# Patient Record
Sex: Male | Born: 1965 | Race: White | Hispanic: No | Marital: Married | Smoking: Never smoker
Health system: Southern US, Community
[De-identification: ages and names within clinical notes are randomized; demographics above are authoritative.]

## PROBLEM LIST (undated history)

## (undated) DIAGNOSIS — R05 Cough: Secondary | ICD-10-CM

## (undated) DIAGNOSIS — F419 Anxiety disorder, unspecified: Secondary | ICD-10-CM

## (undated) DIAGNOSIS — K7581 Nonalcoholic steatohepatitis (NASH): Secondary | ICD-10-CM

## (undated) DIAGNOSIS — E039 Hypothyroidism, unspecified: Secondary | ICD-10-CM

## (undated) DIAGNOSIS — K219 Gastro-esophageal reflux disease without esophagitis: Secondary | ICD-10-CM

## (undated) DIAGNOSIS — R200 Anesthesia of skin: Secondary | ICD-10-CM

## (undated) DIAGNOSIS — G4733 Obstructive sleep apnea (adult) (pediatric): Secondary | ICD-10-CM

## (undated) DIAGNOSIS — E785 Hyperlipidemia, unspecified: Secondary | ICD-10-CM

## (undated) DIAGNOSIS — E119 Type 2 diabetes mellitus without complications: Secondary | ICD-10-CM

## (undated) DIAGNOSIS — I2510 Atherosclerotic heart disease of native coronary artery without angina pectoris: Secondary | ICD-10-CM

## (undated) DIAGNOSIS — M545 Low back pain, unspecified: Secondary | ICD-10-CM

## (undated) DIAGNOSIS — I10 Essential (primary) hypertension: Secondary | ICD-10-CM

## (undated) DIAGNOSIS — G4730 Sleep apnea, unspecified: Secondary | ICD-10-CM

## (undated) DIAGNOSIS — J45909 Unspecified asthma, uncomplicated: Secondary | ICD-10-CM

## (undated) DIAGNOSIS — R059 Cough, unspecified: Secondary | ICD-10-CM

## (undated) HISTORY — DX: Essential (primary) hypertension: I10

## (undated) HISTORY — DX: Low back pain, unspecified: M54.50

## (undated) HISTORY — DX: Cough: R05

## (undated) HISTORY — DX: Sleep apnea, unspecified: G47.30

## (undated) HISTORY — DX: Hypothyroidism, unspecified: E03.9

## (undated) HISTORY — PX: CARDIAC CATHETERIZATION: SHX172

## (undated) HISTORY — DX: Obstructive sleep apnea (adult) (pediatric): G47.33

## (undated) HISTORY — PX: OTHER SURGICAL HISTORY: SHX169

## (undated) HISTORY — DX: Low back pain: M54.5

## (undated) HISTORY — DX: Hyperlipidemia, unspecified: E78.5

## (undated) HISTORY — DX: Anesthesia of skin: R20.0

## (undated) HISTORY — DX: Gastro-esophageal reflux disease without esophagitis: K21.9

## (undated) HISTORY — DX: Cough, unspecified: R05.9

## (undated) HISTORY — DX: Unspecified asthma, uncomplicated: J45.909

## (undated) HISTORY — DX: Nonalcoholic steatohepatitis (NASH): K75.81

## (undated) HISTORY — PX: THYROIDECTOMY: SHX17

## (undated) HISTORY — PX: TRIGGER FINGER RELEASE: SHX641

## (undated) HISTORY — DX: Type 2 diabetes mellitus without complications: E11.9

---

## 1993-04-04 DIAGNOSIS — I10 Essential (primary) hypertension: Secondary | ICD-10-CM

## 1993-04-04 HISTORY — DX: Essential (primary) hypertension: I10

## 1995-04-05 HISTORY — PX: OTHER SURGICAL HISTORY: SHX169

## 1998-04-04 DIAGNOSIS — E119 Type 2 diabetes mellitus without complications: Secondary | ICD-10-CM

## 1998-04-04 HISTORY — DX: Type 2 diabetes mellitus without complications: E11.9

## 1999-06-07 ENCOUNTER — Encounter: Admission: RE | Admit: 1999-06-07 | Discharge: 1999-09-05 | Payer: Self-pay | Admitting: *Deleted

## 1999-09-21 ENCOUNTER — Emergency Department (HOSPITAL_COMMUNITY): Admission: EM | Admit: 1999-09-21 | Discharge: 1999-09-21 | Payer: Self-pay | Admitting: Emergency Medicine

## 1999-09-21 ENCOUNTER — Encounter: Payer: Self-pay | Admitting: Emergency Medicine

## 2000-04-04 DIAGNOSIS — E039 Hypothyroidism, unspecified: Secondary | ICD-10-CM

## 2000-04-04 HISTORY — DX: Hypothyroidism, unspecified: E03.9

## 2001-03-21 ENCOUNTER — Emergency Department (HOSPITAL_COMMUNITY): Admission: EM | Admit: 2001-03-21 | Discharge: 2001-03-21 | Payer: Self-pay

## 2003-04-28 ENCOUNTER — Ambulatory Visit (HOSPITAL_BASED_OUTPATIENT_CLINIC_OR_DEPARTMENT_OTHER): Admission: RE | Admit: 2003-04-28 | Discharge: 2003-04-28 | Payer: Self-pay | Admitting: Orthopedic Surgery

## 2003-04-28 ENCOUNTER — Ambulatory Visit (HOSPITAL_COMMUNITY): Admission: RE | Admit: 2003-04-28 | Discharge: 2003-04-28 | Payer: Self-pay | Admitting: Orthopedic Surgery

## 2003-08-11 ENCOUNTER — Ambulatory Visit (HOSPITAL_COMMUNITY): Admission: RE | Admit: 2003-08-11 | Discharge: 2003-08-11 | Payer: Self-pay | Admitting: Orthopedic Surgery

## 2003-08-11 ENCOUNTER — Ambulatory Visit (HOSPITAL_BASED_OUTPATIENT_CLINIC_OR_DEPARTMENT_OTHER): Admission: RE | Admit: 2003-08-11 | Discharge: 2003-08-11 | Payer: Self-pay | Admitting: Orthopedic Surgery

## 2004-02-13 ENCOUNTER — Encounter: Admission: RE | Admit: 2004-02-13 | Discharge: 2004-03-10 | Payer: Self-pay | Admitting: Family Medicine

## 2004-02-23 ENCOUNTER — Observation Stay (HOSPITAL_COMMUNITY): Admission: RE | Admit: 2004-02-23 | Discharge: 2004-02-24 | Payer: Self-pay | Admitting: Urology

## 2004-06-24 ENCOUNTER — Ambulatory Visit: Payer: Self-pay | Admitting: Family Medicine

## 2004-08-16 ENCOUNTER — Emergency Department (HOSPITAL_COMMUNITY): Admission: EM | Admit: 2004-08-16 | Discharge: 2004-08-16 | Payer: Self-pay | Admitting: Emergency Medicine

## 2005-01-25 ENCOUNTER — Ambulatory Visit: Payer: Self-pay | Admitting: Family Medicine

## 2005-02-03 ENCOUNTER — Ambulatory Visit: Payer: Self-pay | Admitting: Family Medicine

## 2005-02-10 ENCOUNTER — Ambulatory Visit: Payer: Self-pay | Admitting: Family Medicine

## 2005-07-07 ENCOUNTER — Ambulatory Visit: Payer: Self-pay | Admitting: Family Medicine

## 2005-07-12 ENCOUNTER — Ambulatory Visit: Payer: Self-pay | Admitting: Family Medicine

## 2005-07-12 ENCOUNTER — Ambulatory Visit: Payer: Self-pay | Admitting: Endocrinology

## 2005-08-10 ENCOUNTER — Ambulatory Visit: Payer: Self-pay | Admitting: Endocrinology

## 2005-08-31 ENCOUNTER — Ambulatory Visit: Payer: Self-pay | Admitting: Endocrinology

## 2006-01-24 ENCOUNTER — Ambulatory Visit: Payer: Self-pay | Admitting: Endocrinology

## 2006-01-30 ENCOUNTER — Encounter: Admission: RE | Admit: 2006-01-30 | Discharge: 2006-01-30 | Payer: Self-pay | Admitting: Endocrinology

## 2006-02-21 ENCOUNTER — Ambulatory Visit: Payer: Self-pay | Admitting: Endocrinology

## 2006-03-06 ENCOUNTER — Other Ambulatory Visit: Admission: RE | Admit: 2006-03-06 | Discharge: 2006-03-06 | Payer: Self-pay | Admitting: Interventional Radiology

## 2006-03-06 ENCOUNTER — Encounter (INDEPENDENT_AMBULATORY_CARE_PROVIDER_SITE_OTHER): Payer: Self-pay | Admitting: Specialist

## 2006-03-06 ENCOUNTER — Encounter: Admission: RE | Admit: 2006-03-06 | Discharge: 2006-03-06 | Payer: Self-pay | Admitting: Endocrinology

## 2006-03-07 ENCOUNTER — Ambulatory Visit: Payer: Self-pay | Admitting: Endocrinology

## 2006-04-24 ENCOUNTER — Ambulatory Visit: Admission: RE | Admit: 2006-04-24 | Discharge: 2006-04-24 | Payer: Self-pay | Admitting: *Deleted

## 2006-04-24 ENCOUNTER — Ambulatory Visit: Payer: Self-pay | Admitting: Endocrinology

## 2006-04-24 LAB — CONVERTED CEMR LAB: Hepatitis B Surface Ag: NEGATIVE

## 2006-04-26 ENCOUNTER — Ambulatory Visit: Payer: Self-pay | Admitting: Endocrinology

## 2006-05-08 ENCOUNTER — Ambulatory Visit: Payer: Self-pay | Admitting: Endocrinology

## 2006-05-29 ENCOUNTER — Ambulatory Visit: Payer: Self-pay | Admitting: Endocrinology

## 2006-05-31 ENCOUNTER — Encounter (INDEPENDENT_AMBULATORY_CARE_PROVIDER_SITE_OTHER): Payer: Self-pay | Admitting: Specialist

## 2006-05-31 ENCOUNTER — Inpatient Hospital Stay (HOSPITAL_COMMUNITY): Admission: RE | Admit: 2006-05-31 | Discharge: 2006-06-01 | Payer: Self-pay | Admitting: *Deleted

## 2006-06-19 ENCOUNTER — Ambulatory Visit: Payer: Self-pay | Admitting: Endocrinology

## 2006-10-07 ENCOUNTER — Emergency Department (HOSPITAL_COMMUNITY): Admission: EM | Admit: 2006-10-07 | Discharge: 2006-10-07 | Payer: Self-pay | Admitting: Family Medicine

## 2006-10-28 ENCOUNTER — Encounter: Payer: Self-pay | Admitting: Endocrinology

## 2006-10-28 DIAGNOSIS — J45909 Unspecified asthma, uncomplicated: Secondary | ICD-10-CM | POA: Insufficient documentation

## 2006-10-28 HISTORY — DX: Unspecified asthma, uncomplicated: J45.909

## 2006-11-07 ENCOUNTER — Ambulatory Visit: Payer: Self-pay | Admitting: Endocrinology

## 2006-11-07 LAB — CONVERTED CEMR LAB
Albumin: 4.2 g/dL (ref 3.5–5.2)
Alkaline Phosphatase: 75 units/L (ref 39–117)
BUN: 14 mg/dL (ref 6–23)
Bilirubin, Direct: 0.1 mg/dL (ref 0.0–0.3)
Cholesterol: 176 mg/dL (ref 0–200)
Creatinine,U: 157.9 mg/dL
GFR calc Af Amer: 105 mL/min
HDL: 31.1 mg/dL — ABNORMAL LOW (ref >39.0)
Microalb, Ur: 2.1 mg/dL — ABNORMAL HIGH (ref 0.0–1.9)
TSH: 6.75 microintl units/mL — ABNORMAL HIGH (ref 0.35–5.50)
Total Bilirubin: 0.9 mg/dL (ref 0.3–1.2)
Total Protein: 7.3 g/dL (ref 6.0–8.3)
Triglycerides: 228 mg/dL (ref 0–149)
VLDL: 46 mg/dL — ABNORMAL HIGH (ref 0–40)

## 2007-01-19 ENCOUNTER — Encounter: Payer: Self-pay | Admitting: Endocrinology

## 2007-01-19 ENCOUNTER — Ambulatory Visit: Payer: Self-pay | Admitting: Endocrinology

## 2007-03-06 ENCOUNTER — Ambulatory Visit: Payer: Self-pay | Admitting: Endocrinology

## 2007-03-06 DIAGNOSIS — R05 Cough: Secondary | ICD-10-CM

## 2007-03-07 LAB — CONVERTED CEMR LAB
Leukocytes, UA: NEGATIVE
Mucus, UA: NEGATIVE
Specific Gravity, Urine: >=1.03 (ref 1.000–1.03)
Urine Glucose: >=1000 mg/dL — CR
pH: 5 (ref 5.0–8.0)

## 2007-04-16 ENCOUNTER — Telehealth (INDEPENDENT_AMBULATORY_CARE_PROVIDER_SITE_OTHER): Payer: Self-pay | Admitting: *Deleted

## 2007-04-18 ENCOUNTER — Ambulatory Visit: Payer: Self-pay | Admitting: Endocrinology

## 2007-04-18 LAB — CONVERTED CEMR LAB
Folate: 12.1 ng/mL
Hgb A1c MFr Bld: 10.4 % — ABNORMAL HIGH (ref 4.6–6.0)
Vitamin B-12: 482 pg/mL (ref 211–911)

## 2007-05-22 ENCOUNTER — Ambulatory Visit: Payer: Self-pay | Admitting: Endocrinology

## 2007-06-26 ENCOUNTER — Ambulatory Visit: Payer: Self-pay | Admitting: Endocrinology

## 2007-10-25 ENCOUNTER — Ambulatory Visit: Payer: Self-pay | Admitting: Endocrinology

## 2007-11-01 ENCOUNTER — Telehealth (INDEPENDENT_AMBULATORY_CARE_PROVIDER_SITE_OTHER): Payer: Self-pay | Admitting: *Deleted

## 2007-12-17 ENCOUNTER — Telehealth: Payer: Self-pay | Admitting: Family Medicine

## 2008-01-10 ENCOUNTER — Ambulatory Visit: Payer: Self-pay | Admitting: Family Medicine

## 2008-01-10 LAB — CONVERTED CEMR LAB
ALT: 92 units/L — ABNORMAL HIGH (ref 0–53)
AST: 67 units/L — ABNORMAL HIGH (ref 0–37)
Albumin: 4.5 g/dL (ref 3.5–5.2)
Alkaline Phosphatase: 68 units/L (ref 39–117)
Basophils Relative: 0.8 % (ref 0.0–3.0)
Bilirubin, Direct: 0.1 mg/dL (ref 0.0–0.3)
Blood in Urine, dipstick: NEGATIVE
Calcium: 9.3 mg/dL (ref 8.4–10.5)
Chloride: 101 meq/L (ref 96–112)
GFR calc Af Amer: 104 mL/min
GFR calc non Af Amer: 86 mL/min
Glucose, Bld: 189 mg/dL — ABNORMAL HIGH (ref 70–99)
HCT: 45.7 % (ref 39.0–52.0)
Lymphocytes Relative: 25 % (ref 12.0–46.0)
Monocytes Absolute: 0.7 10*3/uL (ref 0.1–1.0)
Monocytes Relative: 7.9 % (ref 3.0–12.0)
Neutro Abs: 6.1 10*3/uL (ref 1.4–7.7)
Neutrophils Relative %: 64.8 % (ref 43.0–77.0)
Platelets: 241 10*3/uL (ref 150–400)
Specific Gravity, Urine: >=1.03
Total Bilirubin: 0.9 mg/dL (ref 0.3–1.2)
Total Protein: 8.2 g/dL (ref 6.0–8.3)
WBC Urine, dipstick: NEGATIVE

## 2008-01-17 ENCOUNTER — Ambulatory Visit: Payer: Self-pay | Admitting: Family Medicine

## 2008-01-29 ENCOUNTER — Telehealth: Payer: Self-pay | Admitting: Endocrinology

## 2008-02-11 ENCOUNTER — Telehealth: Payer: Self-pay | Admitting: Endocrinology

## 2008-02-19 ENCOUNTER — Telehealth (INDEPENDENT_AMBULATORY_CARE_PROVIDER_SITE_OTHER): Payer: Self-pay | Admitting: *Deleted

## 2008-02-21 ENCOUNTER — Telehealth (INDEPENDENT_AMBULATORY_CARE_PROVIDER_SITE_OTHER): Payer: Self-pay | Admitting: *Deleted

## 2008-03-03 ENCOUNTER — Telehealth: Payer: Self-pay | Admitting: Endocrinology

## 2008-03-04 ENCOUNTER — Telehealth: Payer: Self-pay | Admitting: *Deleted

## 2008-03-11 ENCOUNTER — Telehealth: Payer: Self-pay | Admitting: Family Medicine

## 2008-03-12 ENCOUNTER — Telehealth (INDEPENDENT_AMBULATORY_CARE_PROVIDER_SITE_OTHER): Payer: Self-pay | Admitting: *Deleted

## 2008-04-21 ENCOUNTER — Encounter
Admission: RE | Admit: 2008-04-21 | Discharge: 2008-07-20 | Payer: Self-pay | Admitting: Physical Medicine & Rehabilitation

## 2008-04-22 ENCOUNTER — Ambulatory Visit: Payer: Self-pay | Admitting: Physical Medicine & Rehabilitation

## 2008-04-28 ENCOUNTER — Encounter
Admission: RE | Admit: 2008-04-28 | Discharge: 2008-05-26 | Payer: Self-pay | Admitting: Physical Medicine & Rehabilitation

## 2008-06-04 ENCOUNTER — Ambulatory Visit: Payer: Self-pay | Admitting: Physical Medicine & Rehabilitation

## 2008-08-25 ENCOUNTER — Encounter
Admission: RE | Admit: 2008-08-25 | Discharge: 2008-08-27 | Payer: Self-pay | Admitting: Physical Medicine & Rehabilitation

## 2008-08-27 ENCOUNTER — Ambulatory Visit: Payer: Self-pay | Admitting: Physical Medicine & Rehabilitation

## 2009-04-01 ENCOUNTER — Emergency Department (HOSPITAL_BASED_OUTPATIENT_CLINIC_OR_DEPARTMENT_OTHER): Admission: EM | Admit: 2009-04-01 | Discharge: 2009-04-01 | Payer: Self-pay | Admitting: Emergency Medicine

## 2009-04-07 ENCOUNTER — Telehealth: Payer: Self-pay | Admitting: Family Medicine

## 2009-04-07 ENCOUNTER — Ambulatory Visit: Payer: Self-pay | Admitting: Family Medicine

## 2009-04-20 ENCOUNTER — Ambulatory Visit: Payer: Self-pay | Admitting: Family Medicine

## 2009-04-30 ENCOUNTER — Telehealth: Payer: Self-pay | Admitting: Family Medicine

## 2009-05-12 ENCOUNTER — Ambulatory Visit: Payer: Self-pay | Admitting: Family Medicine

## 2009-05-26 ENCOUNTER — Ambulatory Visit: Payer: Self-pay | Admitting: Family Medicine

## 2009-08-19 ENCOUNTER — Telehealth: Payer: Self-pay | Admitting: Family Medicine

## 2009-12-21 ENCOUNTER — Telehealth: Payer: Self-pay | Admitting: Family Medicine

## 2010-04-25 ENCOUNTER — Encounter: Payer: Self-pay | Admitting: *Deleted

## 2010-05-02 LAB — CONVERTED CEMR LAB
ALT: 92 units/L — ABNORMAL HIGH (ref 0–53)
Alkaline Phosphatase: 78 units/L (ref 39–117)
Basophils Absolute: 0.1 10*3/uL (ref 0.0–0.1)
CO2: 29 meq/L (ref 19–32)
Chloride: 104 meq/L (ref 96–112)
Eosinophils Absolute: 0.3 10*3/uL (ref 0.0–0.7)
Eosinophils Relative: 3.2 % (ref 0.0–5.0)
Glucose, Bld: 110 mg/dL — ABNORMAL HIGH (ref 70–99)
HCT: 50.8 % (ref 39.0–52.0)
Hemoglobin: 16.8 g/dL (ref 13.0–17.0)
Hgb A1c MFr Bld: 9.5 % — ABNORMAL HIGH (ref 4.6–6.5)
Microalb Creat Ratio: 43 mg/g — ABNORMAL HIGH (ref 0.0–30.0)
Microalb, Ur: 15.7 mg/dL — ABNORMAL HIGH (ref 0.0–1.9)
Neutrophils Relative %: 60 % (ref 43.0–77.0)
Platelets: 252 10*3/uL (ref 150.0–400.0)
RBC: 5.41 M/uL (ref 4.22–5.81)
RDW: 12.5 % (ref 11.5–14.6)
TSH: 1.42 microintl units/mL (ref 0.35–5.50)
Total Bilirubin: 0.7 mg/dL (ref 0.3–1.2)
WBC: 8.7 10*3/uL (ref 4.5–10.5)

## 2010-05-06 NOTE — Progress Notes (Signed)
Summary: refill  Phone Note Refill Request Message from:  Fax from Pharmacy on Aug 19, 2009 10:50 AM  Refills Requested: Medication #1:  CELEBREX 200 MG CAPS take one tab once daily Initial call taken by: Kern Reap CMA Duncan Dull),  Aug 19, 2009 10:50 AM    Prescriptions: CELEBREX 200 MG CAPS (CELECOXIB) take one tab once daily  #100 x 3   Entered by:   Kern Reap CMA (AAMA)   Authorized by:   Roderick Pee MD   Signed by:   Kern Reap CMA (AAMA) on 08/19/2009   Method used:   Electronically to        [facility]* (retail)       [address]       [place], [place]  [id number]       Ph: [phone]       Fax: [phone]   RxID:   [id number]

## 2010-05-06 NOTE — Progress Notes (Signed)
Summary: [facility] called req script for Physical Therapy  Phone Note From Other Clinic Call back at [phone] Main #    Caller: [facility]  Summary of Call: [facility] called req a script for physical therapy. Please include DX.  Fax # [phone] Initial call taken by: Lucy Antigua,  April 07, 2009 2:32 PM  Follow-up for Phone Call        [place]   ????????? Follow-up by: Roderick Pee MD,  April 07, 2009 5:33 PM  Additional Follow-up for Phone Call Additional follow up Details #1::        referral sent Additional Follow-up by: Kern Reap CMA Duncan Dull),  April 09, 2009 3:13 PM

## 2010-05-06 NOTE — Progress Notes (Signed)
Summary: Pt req xray be sent over to [facility]  Phone Note Call from Patient Call back at [phone] [facility] cell   Caller: spouse-Beth Summary of Call: Pt is req that his lumbar xray be faxed over to Dr. Atilano Median  [facility].  fax# [phone] Initial call taken by: Lucy Antigua,  April 30, 2009 12:49 PM  Follow-up for Phone Call        e faxed Follow-up by: Kern Reap CMA Duncan Dull),  May 01, 2009 4:37 PM

## 2010-05-06 NOTE — Assessment & Plan Note (Signed)
Summary: fup//ccm   Vital Signs:  Patient profile:   45 year old male Weight:      266 pounds Temp:     98.7 degrees F oral BP sitting:   122 / 90  (left arm) Cuff size:   regular  Vitals Entered By: Kern Reap CMA Duncan Dull) (April 20, 2009 10:28 AM)  Reason for Visit follow up office visit   Primary Care Provider:  Kaelah Hayashi   History of Present Illness: [name] [name] is a 45 year old, married male, nonsmoker, who comes in today for follow-up of low back pain.  We saw him on January the fourth with severe right lumbar back pain.  At that time.  His pain was an 8 on a scale of one to 10.  Neurologic exam was negative.  He was given a short course of prednisone, which he  completed, and Flexeril, and Percocet for severe pain.  The Flexeril.  He taking 3 times a day.  The Percocet.  He taking one at bedtime.  His pain now is a 5.  However, 3 days ago, he noticed radiation of pain down his right lateral thigh to the knee.  Also experiencing some numbness in her right lateral thigh.  It is not experienced before.  He had one PT session last week.  It didn't seem to help much.  He is going today for a second treatment area.  No bowel or bladder dysfunction.  Review of systems otherwise negative  Allergies (verified): No Known Drug Allergies  Past History:  Past medical, surgical, family and social histories (including risk factors) reviewed for relevance to current acute and chronic problems.  Past Medical History: Reviewed history from 01/10/2008 and no changes required. NASH NUMBNESS (ICD-782.0) BACK PAIN, LUMBAR (ICD-724.2) COUGH (ICD-786.2) HYPOTHYROIDISM (ICD-244.9) HYPERLIPIDEMIA (ICD-272.4) HYPERTENSION (ICD-401.9) DIABETES MELLITUS, TYPE II (ICD-250.00) ASTHMA (ICD-493.90) Diabetes mellitus, type I  Past Surgical History: Reviewed history from 10/28/2006 and no changes required. Right Knee Repair (1997) T=Right thyroid lobectomy 2008  Family History: Reviewed history  from 01/10/2008 and no changes required. father osteoarthritis mother in good health  Social History: Reviewed history from 01/10/2008 and no changes required. Occupation: Married Never Smoked Alcohol use-no Drug use-no Regular exercise-no  Review of Systems      See HPI  Physical Exam  General:  Well-developed,well-nourished,in no acute distress; alert,appropriate and cooperative throughout examination Msk:  No deformity or scoliosis noted of thoracic or lumbar spine.   Pulses:  R and L carotid,radial,femoral,dorsalis pedis and posterior tibial pulses are full and equal bilaterally Extremities:  No clubbing, cyanosis, edema, or deformity noted with normal full range of motion of all joints.   Neurologic:  No cranial nerve deficits noted. Station and gait are normal. Plantar reflexes are down-going bilaterally. DTRs are symmetrical throughout. Sensory, motor and coordinative functions appear intact.  straight leg raising positive right leg at 30 degrees left leg referred to the right lumbar area at 45 degrees   Impression & Recommendations:  Problem # 1:  BACK PAIN, LUMBAR (ICD-724.2) Assessment Unchanged  The following medications were removed from the medication list:    Cyclobenzaprine Hcl 5 Mg Tabs (Cyclobenzaprine hcl) .Marland Kitchen... 1 by mouth tid    Hydrocodone-acetaminophen 5-325 Mg Tabs (Hydrocodone-acetaminophen) .Marland Kitchen... 2 qd His updated medication list for this problem includes:    Adult Aspirin Low Strength 81 Mg Tbdp (Aspirin) .Marland Kitchen... Take 1 by mouth qd    Flexeril 10 Mg Tabs (Cyclobenzaprine hcl) .Marland Kitchen... Take 1 tablet by mouth three times a day  Percocet 10-325 Mg Tabs (Oxycodone-acetaminophen) .Marland Kitchen... Take 1 tablet by mouth three times a day    Celebrex 200 Mg Caps (Celecoxib) .Marland Kitchen... Take one tab once daily  Orders: T-Lumbar Spine w/Flex & Ext 4 Views ([id number])  Complete Medication List: 1)  Adult Aspirin Low Strength 81 Mg Tbdp (Aspirin) .... Take 1 by mouth qd 2)  Bd U/f  Short Pen Needle 31g X 8 Mm Misc (Insulin pen needle) .... Bid 3)  Simvastatin 80 Mg Tabs (Simvastatin) .... Take 1 by mouth qd 4)  Accu-chek Compact Strp (Glucose blood) .... Check blood sugars two times a day qd 5)  Synthroid 125 Mcg Tabs (Levothyroxine sodium) .Marland Kitchen.. 1 by mouth q am 6)  Prilosec 20 Mg Cpdr (Omeprazole) .... Once daily 7)  Benicar Hct 20-12.5 Mg Tabs (Olmesartan medoxomil-hctz) .... Take 1 by mouth once daily need follow-up appt for addtional refills 8)  Glucophage 1000 Mg Tabs (Metformin hcl) .... 2 by mouth bid 9)  Humalog Kwikpen 100 Unit/ml Soln (Insulin lispro (human)) .... 65 units with meals 10)  Flexeril 10 Mg Tabs (Cyclobenzaprine hcl) .... Take 1 tablet by mouth three times a day 11)  Percocet 10-325 Mg Tabs (Oxycodone-acetaminophen) .... Take 1 tablet by mouth three times a day 12)  Celebrex 200 Mg Caps (Celecoxib) .... Take one tab once daily  Patient Instructions: 1)  See your eye doctor yearly to check for diabetic eye damage. 2)  begin Motrin 800 mg twice a day with food.  Continue the Flexeril and Percocet.  Go to the main office now for x-rays of your spine.  I will call u  the report.  Also go to your physical therapy session.  This afternoon

## 2010-05-06 NOTE — Letter (Signed)
Summary: Out of Work  [facility]  [address] [place], [place] [address]   Phone: [phone]  Fax: [phone]    April 07, 2009   Employee:  [name] [name]    To Whom It May Concern:   For Medical reasons, please excuse the above named employee from work for the following dates:  Start:    End:    If you need additional information, please feel free to contact our office.         Sincerely,    Lucious Groves

## 2010-05-06 NOTE — Progress Notes (Signed)
Summary: Refill Citalopram  Phone Note From Pharmacy      Prescriptions: CELEXA 20 MG TABS (CITALOPRAM HYDROBROMIDE) 1 & 1/2 at bedtime  #150 x 3   Entered by:   Kathrynn Speed CMA   Authorized by:   Roderick Pee MD   Signed by:   Kathrynn Speed CMA on 12/21/2009   Method used:   Faxed to ...       [facility]* (retail)       [address]       [place], [place]  [id number]       Ph: [phone]       Fax: [phone]   RxID:   [id number]

## 2010-05-06 NOTE — Progress Notes (Signed)
Summary: celebrex question?  Phone Note Call from Patient Call back at Home Phone [phone] Call back at [phone]   Caller: live Call For: todd Summary of Call: Forgot to tell you that I'm taking Celebrex 200mg  one daily from Dr. Hermelinda Medicus.  Is it okay to take it with the other meds you gave me today, prednisone, oxycontin, muscle relaxer? Initial call taken by: Rudy Jew, RN,  April 07, 2009 12:09 PM  Follow-up for Phone Call        stop Celebrex while on prednisone Follow-up by: Roderick Pee MD,  April 07, 2009 1:30 PM  Additional Follow-up for Phone Call Additional follow up Details #1::        Phone Call Completed Additional Follow-up by: Rudy Jew, RN,  April 07, 2009 1:45 PM

## 2010-05-06 NOTE — Assessment & Plan Note (Signed)
Summary: elevated BP/dm   Vital Signs:  Patient profile:   45 year old male Weight:      266 pounds Temp:     98.5 degrees F oral BP sitting:   148 / 98  (left arm) Cuff size:   regular  Vitals Entered By: Kern Reap CMA Duncan Dull) (May 12, 2009 11:52 AM)  Reason for Visit elevated blood pressure  Primary Care Provider:  Ellyana Crigler   History of Present Illness: [name] [name] is a 45 year old, married male, nonsmoker, with insulin resistant diabetes, who is on 60 units of regular prior to each meal and 85 units of Lantus b.i.d. however, his blood sugar is still not under good control.  Fasting blood sugar this morning, 173, last A1c over 6 months ago.  His endocrinologist in [facility] moved to [place].  He had an appointment with his new endocrinologist today, but had to cancel.  Is not felt well for a couple weeks.  His blood pressure is than 160/102.  Today.  I get 136/90.  Is also having some heaviness in his chest.  This kind of a constant sensation.  It's not related to exertion.  He does have reflux esophagitis, for which he takes Prilosec 20 mg daily.  No cardiac or pulmonary symptoms.  Allergies: No Known Drug Allergies  Past History:  Past medical, surgical, family and social histories (including risk factors) reviewed for relevance to current acute and chronic problems.  Past Medical History: Reviewed history from 01/10/2008 and no changes required. NASH NUMBNESS (ICD-782.0) BACK PAIN, LUMBAR (ICD-724.2) COUGH (ICD-786.2) HYPOTHYROIDISM (ICD-244.9) HYPERLIPIDEMIA (ICD-272.4) HYPERTENSION (ICD-401.9) DIABETES MELLITUS, TYPE II (ICD-250.00) ASTHMA (ICD-493.90) Diabetes mellitus, type I  Past Surgical History: Reviewed history from 10/28/2006 and no changes required. Right Knee Repair (1997) T=Right thyroid lobectomy 2008  Family History: Reviewed history from 01/10/2008 and no changes required. father osteoarthritis mother in good health  Social History: Reviewed  history from 01/10/2008 and no changes required. Occupation: Married Never Smoked Alcohol use-no Drug use-no Regular exercise-no  Review of Systems      See HPI  Physical Exam  General:  Well-developed,well-nourished,in no acute distress; alert,appropriate and cooperative throughout examination Head:  Normocephalic and atraumatic without obvious abnormalities. No apparent alopecia or balding. Eyes:  No corneal or conjunctival inflammation noted. EOMI. Perrla. Funduscopic exam benign, without hemorrhages, exudates or papilledema. Vision grossly normal. Ears:  External ear exam shows no significant lesions or deformities.  Otoscopic examination reveals clear canals, tympanic membranes are intact bilaterally without bulging, retraction, inflammation or discharge. Hearing is grossly normal bilaterally. Nose:  External nasal examination shows no deformity or inflammation. Nasal mucosa are pink and moist without lesions or exudates. Mouth:  Oral mucosa and oropharynx without lesions or exudates.  Teeth in good repair. Neck:  No deformities, masses, or tenderness noted. Chest Wall:  No deformities, masses, tenderness or gynecomastia noted. Breasts:  No masses or gynecomastia noted Lungs:  Normal respiratory effort, chest expands symmetrically. Lungs are clear to auscultation, no crackles or wheezes. Heart:  Normal rate and regular rhythm. S1 and S2 normal without gallop, murmur, click, rub or other extra sounds. Abdomen:  Bowel sounds positive,abdomen soft and non-tender without masses, organomegaly or hernias noted.   Impression & Recommendations:  Problem # 1:  DIABETES MELLITUS, TYPE I (ICD-250.01) Assessment Deteriorated  The following medications were removed from the medication list:    Benicar Hct 20-12.5 Mg Tabs (Olmesartan medoxomil-hctz) .Marland Kitchen... Take 1 by mouth once daily need follow-up appt for addtional refills His  updated medication list for this problem includes:    Adult  Aspirin Low Strength 81 Mg Tbdp (Aspirin) .Marland Kitchen... Take 1 by mouth qd    Glucophage 1000 Mg Tabs (Metformin hcl) .Marland Kitchen... 2 by mouth bid    Humalog Kwikpen 100 Unit/ml Soln (Insulin lispro (human)) .Marland KitchenMarland KitchenMarland KitchenMarland Kitchen 65 units with meals    Hyzaar 100-25 Mg Tabs (Losartan potassium-hctz) .Marland Kitchen... Take 1 tablet by mouth every morning  Orders: Venipuncture ([address]) TLB-BMP (Basic Metabolic Panel-BMET) (80048-METABOL) TLB-CBC Platelet - w/Differential (85025-CBCD) TLB-Hepatic/Liver Function Pnl (80076-HEPATIC) TLB-TSH (Thyroid Stimulating Hormone) (84443-TSH) TLB-A1C / Hgb A1C (Glycohemoglobin) (83036-A1C) TLB-Microalbumin/Creat Ratio, Urine (82043-MALB) Prescription Created Electronically [phone])  Problem # 2:  HYPERTENSION (ICD-401.9) Assessment: Deteriorated  The following medications were removed from the medication list:    Benicar Hct 20-12.5 Mg Tabs (Olmesartan medoxomil-hctz) .Marland Kitchen... Take 1 by mouth once daily need follow-up appt for addtional refills His updated medication list for this problem includes:    Hyzaar 100-25 Mg Tabs (Losartan potassium-hctz) .Marland Kitchen... Take 1 tablet by mouth every morning  Orders: Venipuncture (09811) TLB-BMP (Basic Metabolic Panel-BMET) (80048-METABOL) TLB-CBC Platelet - w/Differential (85025-CBCD) TLB-Hepatic/Liver Function Pnl (80076-HEPATIC) TLB-TSH (Thyroid Stimulating Hormone) (84443-TSH) TLB-A1C / Hgb A1C (Glycohemoglobin) (83036-A1C) TLB-Microalbumin/Creat Ratio, Urine (82043-MALB) Prescription Created Electronically [phone])  Problem # 3:  BREAST PAIN, BILATERAL (ICD-611.71) Assessment: Deteriorated  Orders: Prescription Created Electronically [phone])  Complete Medication List: 1)  Adult Aspirin Low Strength 81 Mg Tbdp (Aspirin) .... Take 1 by mouth qd 2)  Bd U/f Short Pen Needle 31g X 8 Mm Misc (Insulin pen needle) .... Bid 3)  Simvastatin 80 Mg Tabs (Simvastatin) .... Take 1 by mouth qd 4)  Accu-chek Compact Strp (Glucose blood) .... Check blood sugars two  times a day qd 5)  Synthroid 125 Mcg Tabs (Levothyroxine sodium) .Marland Kitchen.. 1 by mouth q am 6)  Prilosec 20 Mg Cpdr (Omeprazole) .... Once daily 7)  Glucophage 1000 Mg Tabs (Metformin hcl) .... 2 by mouth bid 8)  Humalog Kwikpen 100 Unit/ml Soln (Insulin lispro (human)) .... 65 units with meals 9)  Flexeril 10 Mg Tabs (Cyclobenzaprine hcl) .... Take 1 tablet by mouth three times a day 10)  Percocet 10-325 Mg Tabs (Oxycodone-acetaminophen) .... Take 1 tablet by mouth three times a day 11)  Celebrex 200 Mg Caps (Celecoxib) .... Take one tab once daily 12)  Hyzaar 100-25 Mg Tabs (Losartan potassium-hctz) .... Take 1 tablet by mouth every morning 13)  Celexa 20 Mg Tabs (Citalopram hydrobromide) .Marland Kitchen.. 1 tab @ bedtime  Patient Instructions: 1)  let's continue your current therapy for your diabetes, and call to see the endocrinologist this week. 2)  Discuss with them the possibility of an insulin pump ?????? this helped with your control. 3)  Stop the Benicar begin Hyzaar one daily check her blood pressure daily.  Return to see me in two weeks for follow-up.  When you return bring a record of all your blood pressure readings. Prescriptions: CELEXA 20 MG TABS (CITALOPRAM HYDROBROMIDE) 1 tab @ bedtime  #30 x 1   Entered and Authorized by:   Roderick Pee MD   Signed by:   Roderick Pee MD on 05/12/2009   Method used:   Electronically to        [facility]* (retail)       [address]       [place], [place]  [address]       Ph: [phone]       Fax: [phone]   RxID:  [id number] HYZAAR 100-25 MG TABS (LOSARTAN POTASSIUM-HCTZ) Take 1 tablet by mouth every morning  #100 x 3   Entered and Authorized by:   Roderick Pee MD   Signed by:   Roderick Pee MD on 05/12/2009   Method used:   Electronically to        [facility]* (retail)       [address]       [place], [place]  [address]       Ph: [phone]       Fax: [phone]   RxID:    [id number]   Appended Document: elevated BP/dm     Allergies: No Known Drug Allergies   Complete Medication List: 1)  Adult Aspirin Low Strength 81 Mg Tbdp (Aspirin) .... Take 1 by mouth qd 2)  Bd U/f Short Pen Needle 31g X 8 Mm Misc (Insulin pen needle) .... Bid 3)  Simvastatin 80 Mg Tabs (Simvastatin) .... Take 1 by mouth qd 4)  Accu-chek Compact Strp (Glucose blood) .... Check blood sugars two times a day qd 5)  Synthroid 125 Mcg Tabs (Levothyroxine sodium) .Marland Kitchen.. 1 by mouth q am 6)  Prilosec 20 Mg Cpdr (Omeprazole) .... Once daily 7)  Glucophage 1000 Mg Tabs (Metformin hcl) .... 2 by mouth bid 8)  Humalog Kwikpen 100 Unit/ml Soln (Insulin lispro (human)) .... 65 units with meals 9)  Flexeril 10 Mg Tabs (Cyclobenzaprine hcl) .... Take 1 tablet by mouth three times a day 10)  Percocet 10-325 Mg Tabs (Oxycodone-acetaminophen) .... Take 1 tablet by mouth three times a day 11)  Celebrex 200 Mg Caps (Celecoxib) .... Take one tab once daily 12)  Hyzaar 100-25 Mg Tabs (Losartan potassium-hctz) .... Take 1 tablet by mouth every morning 13)  Celexa 20 Mg Tabs (Citalopram hydrobromide) .Marland Kitchen.. 1 tab @ bedtime  Other Orders: EKG w/ Interpretation (93000)

## 2010-05-06 NOTE — Letter (Signed)
Summary: Out of Work  [facility]  [address] [place], [place] [address]   Phone: [phone]  Fax: [phone]    April 07, 2009   Employee:  NICHOLOS ALOISI    To Whom It May Concern:   For Medical reasons, please excuse the above named employee from work for the following dates:  Start:   Jan. 4, 2011  End:   Jan. 17, 2011 He may return on Jan. 18, 2011  If you need additional information, please feel free to contact our office.         Sincerely,    Lucious Groves

## 2010-05-06 NOTE — Assessment & Plan Note (Signed)
Summary: ER FUP//CCM   Vital Signs:  Patient profile:   45 year old male Height:      70 inches Weight:      268.50 pounds BMI:     38.66 O2 Sat:      97 % on Room air Temp:     98.6 degrees F oral Pulse rate:   108 / minute BP sitting:   140 / 100  (right arm)  Vitals Entered By: Lucious Groves (April 07, 2009 8:54 AM)  O2 Flow:  Room air CC: Est pt C/o back pain x 6 days. Pt denies heavy lifting and extensive stretching. Pt did go to ER 4 days ago./kb Is Patient Diabetic? Yes Did you bring your meter with you today? No Pain Assessment Patient in pain? yes     Location: back Intensity: 9 Type: sharp   Primary Care Provider:  todd  CC:  Est pt C/o back pain x 6 days. Pt denies heavy lifting and extensive stretching. Pt did go to ER 4 days ago./kb.  History of Present Illness: Kalas is a 45 year old, married male, who comes in today for evaluation of acute low back pain.  He states one week ago.  He was at work.  He got up and moved and developed the sudden onset of severe right-sided lumbar back pain.  He points to the right SI joint as a source of his pain.  It's, sharp it's constant it varies in intensity scale from 4 to 9.  It radiates to the right hip, but no lower.  He denies any neurologic symptoms.  No bowel or bladder dysfunction.  He's had recurrent episodes of low back pain over the past 15 years.  He states 15 years ago.  He was told he had a ruptured disk via MRI.  Review of systems otherwise negative  Current Medications (verified): 1)  Adult Aspirin Low Strength 81 Mg  Tbdp (Aspirin) .... Take 1 By Mouth Qd 2)  Bd U/f Short Pen Needle 31g X 8 Mm Misc (Insulin Pen Needle) .... Bid 3)  Simvastatin 80 Mg Tabs (Simvastatin) .... Take 1 By Mouth Qd 4)  Accu-Chek Compact   Strp (Glucose Blood) .... Check Blood Sugars Two Times A Day Qd 5)  Synthroid 125 Mcg Tabs (Levothyroxine Sodium) .Marland Kitchen.. 1 By Mouth Q Am 6)  Prilosec 20 Mg Cpdr (Omeprazole) .... Once Daily 7)   Benicar Hct 20-12.5 Mg Tabs (Olmesartan Medoxomil-Hctz) .... Take 1 By Mouth Once Daily Need Follow-Up Appt For Addtional Refills 8)  Glucophage 1000 Mg Tabs (Metformin Hcl) .... 2 By Mouth Bid 9)  Humalog Kwikpen 100 Unit/ml Soln (Insulin Lispro (Human)) .... 65 Units With Meals 10)  Cyclobenzaprine Hcl 5 Mg Tabs (Cyclobenzaprine Hcl) .Marland Kitchen.. 1 By Mouth Tid 11)  Hydrocodone-Acetaminophen 5-325 Mg Tabs (Hydrocodone-Acetaminophen) .... 2 Qd  Allergies (verified): No Known Drug Allergies  Past History:  Past medical, surgical, family and social histories (including risk factors) reviewed for relevance to current acute and chronic problems.  Past Medical History: Reviewed history from 01/10/2008 and no changes required. NASH NUMBNESS (ICD-782.0) BACK PAIN, LUMBAR (ICD-724.2) COUGH (ICD-786.2) HYPOTHYROIDISM (ICD-244.9) HYPERLIPIDEMIA (ICD-272.4) HYPERTENSION (ICD-401.9) DIABETES MELLITUS, TYPE II (ICD-250.00) ASTHMA (ICD-493.90) Diabetes mellitus, type I  Past Surgical History: Reviewed history from 10/28/2006 and no changes required. Right Knee Repair (1997) T=Right thyroid lobectomy 2008  Family History: Reviewed history from 01/10/2008 and no changes required. father osteoarthritis mother in good health  Social History: Reviewed history from 01/10/2008 and no changes  required. Occupation: Married Never Smoked Alcohol use-no Drug use-no Regular exercise-no  Review of Systems      See HPI  Physical Exam  General:  Well-developed,well-nourished,in no acute distress; alert,appropriate and cooperative throughout examination Msk:  No deformity or scoliosis noted of thoracic or lumbar spine.   Pulses:  R and L carotid,radial,femoral,dorsalis pedis and posterior tibial pulses are full and equal bilaterally Extremities:  No clubbing, cyanosis, edema, or deformity noted with normal full range of motion of all joints.   Neurologic:  his muscle strength, sensation, reflexes are  all normal except no ankle jerks bilaterally.  Positive straight leg raising right leg at 30 degrees   Impression & Recommendations:  Problem # 1:  BACK PAIN, LUMBAR (ICD-724.2) Assessment Deteriorated  The following medications were removed from the medication list:    Vicodin 5-500 Mg Tabs (Hydrocodone-acetaminophen) .Marland Kitchen... Take 1 by mouth once daily prn His updated medication list for this problem includes:    Adult Aspirin Low Strength 81 Mg Tbdp (Aspirin) .Marland Kitchen... Take 1 by mouth qd    Cyclobenzaprine Hcl 5 Mg Tabs (Cyclobenzaprine hcl) .Marland Kitchen... 1 by mouth tid    Hydrocodone-acetaminophen 5-325 Mg Tabs (Hydrocodone-acetaminophen) .Marland Kitchen... 2 qd    Flexeril 10 Mg Tabs (Cyclobenzaprine hcl) .Marland Kitchen... Take 1 tablet by mouth three times a day    Percocet 10-325 Mg Tabs (Oxycodone-acetaminophen) .Marland Kitchen... Take 1 tablet by mouth three times a day  Orders: Physical Therapy Referral (PT)  Complete Medication List: 1)  Adult Aspirin Low Strength 81 Mg Tbdp (Aspirin) .... Take 1 by mouth qd 2)  Bd U/f Short Pen Needle 31g X 8 Mm Misc (Insulin pen needle) .... Bid 3)  Simvastatin 80 Mg Tabs (Simvastatin) .... Take 1 by mouth qd 4)  Accu-chek Compact Strp (Glucose blood) .... Check blood sugars two times a day qd 5)  Synthroid 125 Mcg Tabs (Levothyroxine sodium) .Marland Kitchen.. 1 by mouth q am 6)  Prilosec 20 Mg Cpdr (Omeprazole) .... Once daily 7)  Benicar Hct 20-12.5 Mg Tabs (Olmesartan medoxomil-hctz) .... Take 1 by mouth once daily need follow-up appt for addtional refills 8)  Glucophage 1000 Mg Tabs (Metformin hcl) .... 2 by mouth bid 9)  Humalog Kwikpen 100 Unit/ml Soln (Insulin lispro (human)) .... 65 units with meals 10)  Cyclobenzaprine Hcl 5 Mg Tabs (Cyclobenzaprine hcl) .Marland Kitchen.. 1 by mouth tid 11)  Hydrocodone-acetaminophen 5-325 Mg Tabs (Hydrocodone-acetaminophen) .... 2 qd 12)  Prednisone 20 Mg Tabs (Prednisone) .... Uad 13)  Flexeril 10 Mg Tabs (Cyclobenzaprine hcl) .... Take 1 tablet by mouth three times a  day 14)  Percocet 10-325 Mg Tabs (Oxycodone-acetaminophen) .... Take 1 tablet by mouth three times a day  Patient Instructions: 1)  stay at complete bed rest today and tomorrow.  We will start physical therapy on Thursday. 2)  Begin prednisone two tabs x 3 days, one x 3 days, a half x 3 days, then half a tablet Monday, Wednesday, Friday, for a two-week taper 3)  You can take Flexeril and/or Percocet up to 3 times a day as needed for severe pain.  Return in one week for follow-up. 4)  If you develop any neurologic deficit.  Bowel dysfunction bladder dysfunction, etc. go directly to the emergency room Prescriptions: PERCOCET 10-325 MG TABS (OXYCODONE-ACETAMINOPHEN) Take 1 tablet by mouth three times a day  #50 x 0   Entered and Authorized by:   Roderick Pee MD   Signed by:   Roderick Pee MD on 04/07/2009   Method  used:   Print then Give to Patient   RxID:   [id number] FLEXERIL 10 MG TABS (CYCLOBENZAPRINE HCL) Take 1 tablet by mouth three times a day  #50 x 1   Entered and Authorized by:   Roderick Pee MD   Signed by:   Roderick Pee MD on 04/07/2009   Method used:   Print then Give to Patient   RxID:   [id number] PREDNISONE 20 MG TABS (PREDNISONE) UAD  #40 x 1   Entered and Authorized by:   Roderick Pee MD   Signed by:   Roderick Pee MD on 04/07/2009   Method used:   Print then Give to Patient   RxID:   [id number]   Appended Document: Orders Update    Clinical Lists Changes  Orders: Added new Referral order of Physical Therapy Referral (PT) - Signed

## 2010-05-06 NOTE — Assessment & Plan Note (Signed)
Summary: 2 wk rov/njr   Vital Signs:  Patient profile:   45 year old male Weight:      260 pounds Temp:     98.7 degrees F oral BP sitting:   124 / 84  (left arm) Cuff size:   regular  Vitals Entered By: Kern Reap CMA Duncan Dull) (May 26, 2009 9:19 AM)  Reason for Visit follow up blood pressure and labs  Primary Care Provider:  Jasmon Graffam   History of Present Illness: [name] [name] is a 45 year old male, nonsmoker, who comes in today for follow-up of multiple issues.  His hypertension is under much better control on Hyzaar 100 -- 25 daily.  BP today 124/84.  He also feels less anxious and depressed on the Celexa 20 mg nightly.  He still has some anxiety in the morning.  We discussed options.  We will increase the Celexa to 30 mg a day.  Advised to increase by 10 mg every month until we reached 80 mg a day or until he feels much better.  His blood sugar is still erratic.  The other day.  His fasting was 110 however, his hemoglobin A1c was 9.5%.  He is to see the endocrinologist.  The first week in March.  Will discuss options, including possible insulin pump.  He has some microalbumin 15.7 mg in his urine however, his GFR is 85, creatinine 1.0.  He has mild elevation of his AST and ALT.  60 and 92.  We think this is related to the simvastatin.  His dose was decreased from 80 to 20 mg per day.  He also takes Celebrex 200 mg daily for the past 12 months.  We talked about the pluses and minuses of Celebrex.  He wishes to continue that.  He says he, doesn't it's hard to walk.  This was given to him by Dr. Hermelinda Medicus.  Advise decreasing the Celebrex to one tablet Monday, Wednesday, Friday.  There are  Allergies (verified): No Known Drug Allergies  Past History:  Past medical, surgical, family and social histories (including risk factors) reviewed for relevance to current acute and chronic problems.  Past Medical History: Reviewed history from 01/10/2008 and no changes required. NASH NUMBNESS  (ICD-782.0) BACK PAIN, LUMBAR (ICD-724.2) COUGH (ICD-786.2) HYPOTHYROIDISM (ICD-244.9) HYPERLIPIDEMIA (ICD-272.4) HYPERTENSION (ICD-401.9) DIABETES MELLITUS, TYPE II (ICD-250.00) ASTHMA (ICD-493.90) Diabetes mellitus, type I  Past Surgical History: Reviewed history from 10/28/2006 and no changes required. Right Knee Repair (1997) T=Right thyroid lobectomy 2008  Family History: Reviewed history from 01/10/2008 and no changes required. father osteoarthritis mother in good health  Social History: Reviewed history from 01/10/2008 and no changes required. Occupation: Married Never Smoked Alcohol use-no Drug use-no Regular exercise-no  Review of Systems      See HPI  Physical Exam  General:  Well-developed,well-nourished,in no acute distress; alert,appropriate and cooperative throughout examination   Impression & Recommendations:  Problem # 1:  DIABETES MELLITUS, TYPE I (ICD-250.01) Assessment Unchanged  His updated medication list for this problem includes:    Adult Aspirin Low Strength 81 Mg Tbdp (Aspirin) .Marland Kitchen... Take 1 by mouth qd    Glucophage 1000 Mg Tabs (Metformin hcl) .Marland Kitchen... 2 by mouth bid    Humalog Kwikpen 100 Unit/ml Soln (Insulin lispro (human)) .Marland KitchenMarland KitchenMarland KitchenMarland Kitchen 65 units with meals    Hyzaar 100-25 Mg Tabs (Losartan potassium-hctz) .Marland Kitchen... Take 1 tablet by mouth every morning  Orders: Prescription Created Electronically [phone])  Problem # 2:  BACK PAIN, LUMBAR (ICD-724.2) Assessment: Improved  The following medications were removed from  the medication list:    Flexeril 10 Mg Tabs (Cyclobenzaprine hcl) .Marland Kitchen... Take 1 tablet by mouth three times a day    Percocet 10-325 Mg Tabs (Oxycodone-acetaminophen) .Marland Kitchen... Take 1 tablet by mouth three times a day His updated medication list for this problem includes:    Adult Aspirin Low Strength 81 Mg Tbdp (Aspirin) .Marland Kitchen... Take 1 by mouth qd    Celebrex 200 Mg Caps (Celecoxib) .Marland Kitchen... Take one tab once daily  Orders: Prescription  Created Electronically [phone])  Problem # 3:  HYPERTENSION (ICD-401.9) Assessment: Improved  His updated medication list for this problem includes:    Hyzaar 100-25 Mg Tabs (Losartan potassium-hctz) .Marland Kitchen... Take 1 tablet by mouth every morning  Orders: Prescription Created Electronically [phone])  Complete Medication List: 1)  Adult Aspirin Low Strength 81 Mg Tbdp (Aspirin) .... Take 1 by mouth qd 2)  Bd U/f Short Pen Needle 31g X 8 Mm Misc (Insulin pen needle) .... Bid 3)  Accu-chek Compact Strp (Glucose blood) .... Check blood sugars two times a day qd 4)  Synthroid 125 Mcg Tabs (Levothyroxine sodium) .Marland Kitchen.. 1 by mouth q am 5)  Prilosec 20 Mg Cpdr (Omeprazole) .... Once daily 6)  Glucophage 1000 Mg Tabs (Metformin hcl) .... 2 by mouth bid 7)  Humalog Kwikpen 100 Unit/ml Soln (Insulin lispro (human)) .... 65 units with meals 8)  Celebrex 200 Mg Caps (Celecoxib) .... Take one tab once daily 9)  Hyzaar 100-25 Mg Tabs (Losartan potassium-hctz) .... Take 1 tablet by mouth every morning 10)  Celexa 20 Mg Tabs (Citalopram hydrobromide) .Marland Kitchen.. 1 & 1/2 at bedtime 11)  Simvastatin 20 Mg Tabs (Simvastatin) .... Take one tab at bedtime  Patient Instructions: 1)  decrease the Celebrex to 200 mg Monday, Wednesday, Friday, and increase the Celexa to 30 mg daily. 2)  Past the endocrinologist, to send me a copy of his thoughts.  Will go from there Prescriptions: CELEBREX 200 MG CAPS (CELECOXIB) take one tab once daily  #100 x 3   Entered and Authorized by:   Roderick Pee MD   Signed by:   Roderick Pee MD on 05/26/2009   Method used:   Electronically to        [facility]. [phone]* (retail)       [address]       [place], [place]  [address]       Ph: [phone]       Fax: [phone]   RxID:   [id number] CELEXA 20 MG TABS (CITALOPRAM HYDROBROMIDE) 1 & 1/2 at bedtime  #150 x 3   Entered and Authorized by:   Roderick Pee MD   Signed by:   Roderick Pee MD on 05/26/2009   Method used:   Electronically to        [facility]. [phone]* (retail)       [address]       [place], [place]  [address]       Ph: [phone]       Fax: [phone]   RxID:   [id number]   Prevention & Chronic Care Immunizations   Influenza vaccine: Fluvax 3+  (01/10/2008)    Tetanus booster: Not documented    Pneumococcal vaccine: Not documented  Other Screening   Smoking status: never  (01/10/2008)  Diabetes Mellitus   HgbA1C: 9.5  (05/12/2009)  Eye exam: Not documented    Foot exam: Not documented   High risk foot: Not documented   Foot care education: Not documented    Urine microalbumin/creatinine ratio: 43.0  (05/12/2009)  Lipids   Total Cholesterol: 176  (11/07/2006)   LDL: DEL  (11/07/2006)   LDL Direct: 120.3  (11/07/2006)   HDL: 31.1  (11/07/2006)   Triglycerides: 228  (11/07/2006)    SGOT (AST): 60  (05/12/2009)   SGPT (ALT): 92  (05/12/2009)   Alkaline phosphatase: 78  (05/12/2009)   Total bilirubin: 0.7  (05/12/2009)  Hypertension   Last Blood Pressure: 124 / 84  (05/26/2009)   Serum creatinine: 1.0  (05/12/2009)   Serum potassium 4.2  (05/12/2009)  Self-Management Support :    Diabetes self-management support: Not documented    Hypertension self-management support: Not documented    Lipid self-management support: Not documented

## 2010-06-22 ENCOUNTER — Other Ambulatory Visit: Payer: Self-pay | Admitting: Family Medicine

## 2010-08-17 NOTE — Assessment & Plan Note (Signed)
[facility]                                 ON-CALL NOTE   NAHUN, KRONBERG             MRN:          [id number]  DATE:10/07/2006                            DOB:          06-20-1965    PRIMARY CARE PHYSICIAN:  Romero Belling, M.D.   PHONE#:  [phone]   ON CALL PROGRESS NOTE:  The patient calling because he has severe ear  pain and cannot stand the pain. Referred to the emergency room for  evaluation.     Jeffrey A. Tawanna Cooler, MD  Electronically Signed    JAT/MedQ  DD: 10/07/2006  DT: 10/08/2006  Job #: [id number]

## 2010-08-17 NOTE — Assessment & Plan Note (Signed)
[name] [name] is back regarding his multiple pain complaints.  His feet have  been doing better with more appropriate shoes and his orthotics.  Celebrex seems to be helping with his overall pain.  His pain is 4-7/10.  He described it as burning and tingling.  He does complain of some more  pain in his right hip radiating down to the right knee, but not past.  It seems to bother him more when he is sitting in his car and driving.  He seems to be driving a lot more with his work as of late.  Pain  interferes with general activity, relations with others, enjoyment of  life on a moderate level.  Pain worsens with activity and sitting, and  improves generally with rest.  He is still working on his sugars and  weight.  His last hemoglobin A1c was 9.   REVIEW OF SYSTEMS:  Notable for the above as well as some depression and  anxiety.  Full 14-point review is in the written health and history  section of the chart.  Other pertinent positives are above.   SOCIAL HISTORY:  The patient is married and lives with his wife and son,  and he is working as above.   PHYSICAL EXAMINATION:  VITAL SIGNS:  Blood pressure is 125/81, pulse is  94, respiratory rate 18, and he is sating 96% on room air.  GENERAL:  The patient is pleasant, alert, and oriented x3.  Weight  appears stable.  Motor function is 5/5 in all 4 limbs.  Sensory exam is intact.  Reflexes  are 2+.  He had pain in the right greater trochanter along the posterior  aspect with pressure and with cross-legged maneuver.  He had some  crepitus in the knees.  I did not examine the feet today.  He is able to  bend at the low back without significant difficulty today.  Gait was  generally stable.  He was wearing appropriate athletic shoes today.  Cognitively, he is intact.  HEART:  Regular.  CHEST:  Clear.  ABDOMEN:  Soft, nontender.   ASSESSMENT:  1. Osteoarthritis of the knees and left plantar fasciitis.  2. Greater trochanter bursitis and low back  pain.  3. Uncontrolled diabetes.  4. Obesity.  5. Hypertension.   PLAN:  1. Again, we discussed bigger picture issues such as appropriate diet,      increasing exercise.  I understand that exercise is difficult      considering the demands of his job, but certainly he could make      efforts to improve his dietary intake.  2. Continue appropriate shoe wear and orthotics.  I think he is to      look at alternative shoes for work.  3. Continue Voltaren gel for arch as well as knees.  He also can use      this for his right hip.  Recommended ice, stretching exercises for      low back and leg which we reviewed today.  4. Low-dose Celebrex daily scheduled to p.r.n.  5. Consider injections to left heel or right hip if needed.  I think      he can manage this conservatively.  6. I will see him back in 6 months.      Ranelle Oyster, M.D.  Electronically Signed     ZTS/MedQ  D:  08/27/2008 10:30:45  T:  08/28/2008 02:12:23  Job #:  [id number]   cc:   Tinnie Gens A. Tawanna Cooler, MD  [address] [place]  [place] [address]

## 2010-08-17 NOTE — Assessment & Plan Note (Signed)
[name] [name] is back regarding his multiple pain complaints.  We had  suggested some low-dose Celebrex for arthralgias, although he was  concerned about blood pressure effects and never started the samples.  He did use some glucosamine and noticed no significant change.  I did  receive his lab workup and he did have an ESR and rheumatoid factor  checked which were negative.  His hemoglobin A1c was extremely high,  however.  He complains of ongoing knee pain as well as some symptoms of  his left foot most predominantly along the area where he has had plantar  fasciitis before.  He does not wear insert at this point.   REVIEW OF SYSTEMS:  Notable for numbness, weakness, high sugars.  Other  pertinent positives are above and full review is in the written health  and history section of the chart.   SOCIAL HISTORY:  The patient is married, still working full-time.  He  does not drink or smoke.   PHYSICAL EXAMINATION:  VITAL SIGNS:  Blood pressure is 132/83, pulse is  96, respiratory rate 80.  He is sating 93% on room air.  GENERAL:  The patient is pleasant, remains overweight.  He has antalgia  both legs today more left than right.  Sensation is normal.  He is  intact.  Motor function 5/5.  Reflexes are 1+, 2+ throughout.  Knees are  somewhat painful with meniscal maneuvers.  He has crepitus left more  than right today with flexion and extension.  No dramatic swelling was  appreciated today.  Left foot was notable for pain along the medial arch  and taut bands of tissue palpated as well which reproduced pain with  pressure.  Right foot was minimally tender with manipulation and  pressure on the arch or heel.  He does have slight flattening of both  arches.  Cognitively, he is intact.  HEART:  Regular.  CHEST:  Clear.  ABDOMEN:  Soft, nontender.  He remains overweight.   ASSESSMENT:  1. Persistent low back pain as well as arthritis in the knees and left-      sided plantar fasciitis.  2.  Poorly controlled diabetes.  3. Obesity.  4. Hypertension.   PLAN:  1. Again, I think that Mr. Gaughan need to look at bigger issues      once again including his obesity and poor glycemic control before      he really is able to improve upon his pain issues in the long-term      sense.  He will need to  increase his exercise activity and likely      need to lose 40-50 pounds before his back and foot will begin to      feel better.  I tried to give him some encouragement that this is a      long-term process and nothing will happen quickly or easily, but he      need to make a long-term commitment to it.  2. We will send him to advanced orthotics for a medial arch, heel      cushion orthotic.  He is to continue with appropriate shoe wear.  3. I gave him samples of the Voltaren gel to use over his arch as well      as his knees which may be beneficial.  4. Continue with glucosamine supplement.  5. He can try low-dose Celebrex, watching closely for his blood      pressure.  Certainly, we do want to  elevate his blood pressure with      the Celebrex.  Obviously, weight loss will help his blood pressure      as well.  6. Consider injections to left heel as well.  7. I will see him back in about 3 months' time.      Ranelle Oyster, M.D.  Electronically Signed     ZTS/MedQ  D:  06/04/2008 10:44:58  T:  06/05/2008 01:15:07  Job #:  [id number]   cc:   Tinnie Gens A. Tawanna Cooler, MD  [address] [place]  [place] [address]

## 2010-08-20 NOTE — Op Note (Signed)
NAME:  [name] [name], [name] [name]                     ACCOUNT NO.:  [id number]   MEDICAL RECORD NO.:  [id number]                   PATIENT TYPE:  AMB   LOCATION:  DSC                                  FACILITY:  [facility]   PHYSICIAN:  Robert A. Thurston Hole, M.D.              DATE OF BIRTH:  Oct 09, 1965   DATE OF PROCEDURE:  04/28/2003  DATE OF DISCHARGE:                                 OPERATIVE REPORT   PREOPERATIVE DIAGNOSIS:  Left shoulder partial rotator cuff tear with  impingement.   POSTOPERATIVE DIAGNOSES:  Left shoulder partial rotator cuff tear with  impingement with partial labrum tear.   PROCEDURES:  1. Left shoulder examination under anesthesia followed by arthroscopic     partial rotator cuff tear debridement and partial labral tear     debridement.  2. Left shoulder subacromial decompression.   SURGEON:  Elana Alm. Thurston Hole, M.D.   ASSISTANT:  Julien Girt, P.A.   ANESTHESIA:  General.   OPERATIVE TIME:  40 minutes.   COMPLICATIONS:  None.   INDICATION FOR PROCEDURE:  [name] [name] is a 45 year old gentleman who has  had significant left shoulder pain for the past three months increasing in  nature with exam an MRI documenting a partial rotator cuff tear.  He has  failed conservative care and is now to undergo arthroscopy.   DESCRIPTION:  [name] [name] was brought to the operating room on April 28, 2003, after an interscalene block having been placed in the holding room by  anesthesia for postoperative pain control.  He was placed on the operative  table in the supine position.  After being placed under general anesthesia,  his left shoulder was examined under anesthesia.  He had full range of  motion, and his shoulder was stable to ligamentous exam.  He was then placed  in a beach chair position, and his shoulder and arm was prepped using  sterile DuraPrep and draped using sterile technique.  Originally, through a  posterior arthroscopic portal, the arthroscopy with  a pump attached was  placed.  Then, through an anterior portal, an arthroscopic probe was placed.  On initial inspection, the articular cartilage in the glenohumeral joint was  intact.  The anterior labrum showed partially tearing, 25%, which was  debrided.  Inferior labrum and anterior-inferior glenohumeral ligament  complex was intact.  Superior labrum biceps tendon anchor was intact.  The  biceps tendon was intact.  The posterior-inferior labrum showed moderate  fraying, 20%, and this was debrided.  The inferior capsular recess was free  of pathology.  The subscapularis tendon showed a significant partial tear,  30%, and this was debrided.  Supraspinatus also had a partial tear, 15%, and  this was debrided.  The rest of the rotator cuff was intact.  At this point,  a lateral arthroscopic portal was made, and the subacromial space was  entered.  Moderately thickened bursitis was resected.  The rotator cuff was  frayed and partially torn on the bursal surface.  This was debrided, but a  significant increased tear was not found.  Impingement was noted, and a  subacromial decompression was carried out removing 6-8 mm of the  undersurface of the anterolateral and anteromedial acromion and CA ligament  release carried out as well.  The [facility], The joint was not disturbed.  After this  was done, the shoulder could be brought through a full range of motion with  no impingement on the rotator cuff.  At this point, it was felt that all the  pathology had been satisfactorily addressed.  The instruments were removed.  Portals were closed with 3-0 nylon sutures.  Sterile dressings and a sling  applied, and the patient awakened and taken to the recovery room in stable  condition.   FOLLOW-UP CARE:  [name] [name] will be followed as an outpatient, on  Percocet and Naprosyn, with early physical therapy.  He will be seen back in  the office in a week for sutures out and followup.                                                Robert A. Thurston Hole, M.D.    RAW/MEDQ  D:  04/28/2003  T:  04/28/2003  Job:  [id number]

## 2010-08-20 NOTE — Op Note (Signed)
NAME:  RIDER, ERMIS NO.:  [id number]   MEDICAL RECORD NO.:  [id number]          PATIENT TYPE:  AMB   LOCATION:  DAY                          FACILITY:  [facility]   PHYSICIAN:  Sigmund I. 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   PROCEDURE:  Infrapubic placement of three-piece AMS penile prosthesis.   ATTENDING SURGEON:  Sigmund I. Patsi Sears, M.D.   RESIDENT SURGEON:  Rhae Lerner, M.D.   ANESTHESIA:  General endotracheal anesthesia.   COMPLICATIONS:  None.   INDICATIONS FOR PROCEDURE:  Mr. Age is a 45 year old male with a  history of significant erectile dysfunction secondary to Peyronie's disease  who has failed conservative management. After a long discussion with Mr.  Solanki concerning treatment options, he has elected to proceed with  placement of a penile prosthesis.   DESCRIPTION OF PROCEDURE IN DETAIL:  The patient was brought to the  operating room. Following the induction of general endotracheal anesthesia  and a 10-minute scrub with antibacterial soap, was prepped and draped in the  usual sterile fashion. Incision was subsequently performed just above the  base of the penis overlying the pubic bone and dissection carried down to  the dorsal surface of the corporal bodies. Both right and left corpora were  cleaned of all fat and adherent tissues. Two interrupted 3-0 Vicryl sutures  were placed parallel to each other and each corporal body. Corporotomy  incisions were subsequently made between the two sutures. The corporal  bodies were subsequently dilated up to a size 13 on each side both  proximally and distally. Once this was complete, the corpora were  measured  and were shown to be 17 cm in length. The decision was therefore made to  place 15-cm cylinders with 2-cm rear-tip extenders for a total length of 17  cm. Prior to placement of the cylinders, dissection was carried down to the  anterior rectus fascia in the superior part of the wound. The rectus fascia  was subsequently opened and a reservoir placed in the retropubic space. The  fascia was subsequently closed around the reservoir tubing using a 3-0  Vicryl suture. The prosthesis cylinders were subsequently placed into the  corpora using a Furlow tool. Both cylinders were placed without difficulty.  The corporotomy incisions were subsequently closed using running 3-0 Vicryl  suture after testing the prosthesis to demonstrate that the cylinders were  not buckling. Once the corporotomy incisions had been closed, a tunnel was  created between the infrapubic incision and the dependent right scrotum. The  pump was subsequently placed down into the dependent right scrotum. All  tubing was subsequently connected within the incision itself. The prosthesis  was subsequently tested and appeared to be functioning properly. The fascia  and subcutaneous tissues were subsequently closed over the prosthesis tubing  in 2 layers, both running 3-  0 Vicryl suture. The skin was  subsequently reapproximated using skin  staples. The patient was subsequently allowed to awaken, and the case was  ended. The patient tolerated the procedure well, and there were no  complications.   Sigmund I. Patsi Sears, M.D., was present for the entire care and  participated in all aspects of the procedure.     Jaso   JP/MEDQ  D:  02/23/2004  T:  02/23/2004  Job:  [id number]

## 2010-08-20 NOTE — Op Note (Signed)
NAME:  [name] [name], [name] [name]                     ACCOUNT NO.:  [id number]   MEDICAL RECORD NO.:  [id number]                   PATIENT TYPE:  AMB   LOCATION:  DSC                                  FACILITY:  [facility]   PHYSICIAN:  Robert A. Thurston Hole, M.D.              DATE OF BIRTH:  1965/11/24   DATE OF PROCEDURE:  08/11/2003  DATE OF DISCHARGE:                                 OPERATIVE REPORT   PREOPERATIVE DIAGNOSIS:  Left shoulder arthrofibrosis, status post shoulder  arthroscopy.   POSTOPERATIVE DIAGNOSIS:  Left shoulder arthrofibrosis, status post shoulder  arthroscopy.   OPERATION PERFORMED:  Left shoulder examination under anesthesia followed by  manipulation and cortisone injection.   SURGEON:  Elana Alm. Thurston Hole, M.D.   ANESTHESIA:  General.   OPERATIVE TIME:  10 minutes.   COMPLICATIONS:  None.   INDICATIONS FOR PROCEDURE:  The patient is a 45 year old gentleman who is 3-  1/2 months post left shoulder arthroscopy for partial rotator cuff tear  debridement with SAD who has had persistent problems with pain and  arthrofibrosis of the shoulder despite intensive postoperative  rehabilitation and is now to undergo examination under anesthesia,  manipulation and injection.   DESCRIPTION OF PROCEDURE:  [name] [name] was brought to the operating room  on Aug 11, 2003, placed on the operating table in the supine position.  After an adequate level of general anesthesia was obtained, his left  shoulder was examined.  Initial range of motion showed forward flexion 170,  abduction 170, internal external rotation of 50 degrees.  Gentle  manipulation was carried out breaking up soft adhesions and improving  external rotation to 85, internal rotation to 85.  Shoulder remained stable  to ligamentous exam and he had full flexion and abduction.  The shoulder was  sterily injected with 80 mg of Depo-Medrol and 10 mL of 0.25% Marcaine with  epinephrine, half of this  in the subacromial space  and half in the joint.  After this was done, he was then awakened and taken to recovery in stable  condition.   FOLLOW UP:  [name] [name] will be followed as an outpatient on Percocet and  Naprosyn with early aggressive physical therapy.  See him back in the office  in a week for suture removal and follow-up.                                               Robert A. Thurston Hole, M.D.    RAW/MEDQ  D:  08/11/2003  T:  08/11/2003  Job:  [id number]

## 2010-08-20 NOTE — Consult Note (Signed)
[facility]                          ENDOCRINOLOGY CONSULTATION   ANJELO, PULLMAN                  MRN:          [id number]  DATE:04/24/2006                            DOB:          11/08/1965    REASON FOR VISIT:  Follow up diabetes.   HISTORY OF PRESENT ILLNESS:  A 45 year old man who states he is having  trouble remembering to take his insulin.  His surgery was cancelled  today due to his elevated glucose.   Dr. Colin Benton noted elevated LFTs on his recent blood test.   He also has some itching and a slight rash along his belt line, and he  wonders if this could be related to his insulin injections.   PAST MEDICAL HISTORY:  1. Dyslipidemia.  2. Hypertension.   REVIEW OF SYSTEMS:  Denies hypoglycemia.  Denies fever.   PHYSICAL EXAMINATION:  VITAL SIGNS:  Blood pressure 121/85, heart rate  94, temperature 97.6.  The weight is 258.  GENERAL:  No distress.  SKIN:  Along the lower abdomen and flank along the belt line, there is a  minimal, eczematous rash.   LABORATORY STUDIES:  On April 19, 2006, GOT is 40, GPT 76.   IMPRESSION:  1. He states that he is having trouble his insulin as prescribed;      therefore, appears to need a simpler regimen.  2. Rash, which is very unlikely related to his insulin injections.  3. Elevated hepatic transaminases.   PLAN:  1. Change insulin to Humalog 75/25 45 units q.a.m. and 25 units q.p.m.  2. TAC cream 0.1% t.i.d. p.r.n.  3. Check hepatitis B and C serologies.  4. Return in about three weeks.     Sean A. Everardo All, MD  Electronically Signed    SAE/MedQ  DD: 04/25/2006  DT: 04/25/2006  Job #: [id number]   cc:   Alfonse Ras, MD

## 2010-08-20 NOTE — Op Note (Signed)
NAME:  [name] [name], [name] [name] NO.:  [id number]   MEDICAL RECORD NO.:  [id number]          PATIENT TYPE:  INP   LOCATION:  X003                         FACILITY:  [facility]   PHYSICIAN:  Alfonse Ras, MD   DATE OF BIRTH:  08-14-1965   DATE OF PROCEDURE:  05/31/2006  DATE OF DISCHARGE:                               OPERATIVE REPORT   PREOPERATIVE DIAGNOSIS:  Right thyroid nodule.   POSTOPERATIVE DIAGNOSIS:  Right thyroid nodule.   PROCEDURE:  Right thyroid lobectomy.   SURGEON:  Baruch Merl, MD   ASSISTANT:  Francina Ames, MD   ANESTHESIA:  General.   DESCRIPTION OF PROCEDURE:  The patient was taken to the operating room  and placed in a supine position.  After adequate general anesthesia was  induced using endotracheal tube, the neck was hyperextended, and the  neck was prepped and draped in normal sterile fashion.  Using a  transverse low collar incision, I dissected down to the platysma.  Platysma was divided and superior and inferior flaps were created.  Mahorner retractor was then placed under the flaps.  The strap muscles  were divided in the midline, and the thyroid was encountered.  Mobilization of the thyroid was done using blunt and sharp dissection,  dissecting the strap muscles off the thyroid.  Superior pole was  identified.  A right-angle clamp was passed around it.  It was tied with  a 2-0 silk superiorly and a clip and inferiorly with a 2-0 silk as well.  This was then divided.  The thyroid then mobilized anteriorly with small  venous tributaries being taken down with clips and dissection.  Middle  thyroid vein was identified, clipped, and divided.  Recurrent laryngeal  nerve was identified and preserved, and the thyroid was taken off the  trachea.  It was clamped using a hemostat.  The nodule was palpated in  the inferior lobe of the right thyroid and sent for frozen section.  The  patient did have a significant amount of oozing from the thyroid  bed,  and therefore Surgicel was placed, and FloSeal was placed over this.  At  that point, adequate hemostasis was ensured.  Frozen section showed no  evidence of atypia, and therefore the strap muscles were closed with a  running locking 3-0 Vicryl suture.  Platysma was then closed with  interrupted 3-0 Vicryl sutures, and skin was closed with staples and  Steri-Strips.  The patient tolerated the procedure well and went to PACU  in good condition.      Alfonse Ras, MD  Electronically Signed     KRE/MEDQ  D:  05/31/2006  T:  05/31/2006  Job:  [id number]   cc:   Enclose path report Dr. Romero Belling

## 2010-08-20 NOTE — Consult Note (Signed)
[facility]                          ENDOCRINOLOGY CONSULTATION   MYAN, SUIT                  MRN:          [id number]  DATE:03/07/2006                            DOB:          03-Mar-1966    REASON FOR VISIT:  Follow up diabetes.   HISTORY OF PRESENT ILLNESS:  The patient is a 45 year old man, now  taking Humalog 5 units t.i.d. (q.a.c.).  He states his glucoses are in  the 200s and he is having to take some extra insulin to control it.   He was also found to recently have a thyroid nodule, for which he had a  biopsy done yesterday.  The results are classified as suspicious.   PAST MEDICAL HISTORY:  1. Asthma.  2. Hypertension.  3. Dyslipidemia.   REVIEW OF SYSTEMS:  Denies hypoglycemia.   PHYSICAL EXAMINATION:  VITAL SIGNS:  Blood pressure 123/78, heart rate  97, temperature is 98.2, the weight is 257.  GENERAL:  No distress.  NEUROLOGIC:  Alert, oriented.  Does not appear anxious nor depressed.   IMPRESSION:  1. Diabetes with an increase in his therapy needed.  2. Thyroid nodule, suspicious on biopsy.   PLAN:  1. Increase Humalog to 10 units t.i.d. (q.a.c.).  2. Continue Byetta and oral agents for insulin for now.  3. Refer to surgery to consider diagnostic right thyroid lobectomy.  4. Return in about 30 days.     Sean A. Everardo All, MD  Electronically Signed    SAE/MedQ  DD: 03/07/2006  DT: 03/08/2006  Job #: [id number]   cc:   Dr. Tawanna Cooler, Redby Brassfield

## 2010-09-25 ENCOUNTER — Inpatient Hospital Stay (INDEPENDENT_AMBULATORY_CARE_PROVIDER_SITE_OTHER)
Admission: RE | Admit: 2010-09-25 | Discharge: 2010-09-25 | Disposition: A | Discharge status: Home / Self Care | Payer: BC Managed Care – PPO | Source: Ambulatory Visit | Attending: Emergency Medicine | Admitting: Emergency Medicine

## 2010-09-25 DIAGNOSIS — H60399 Other infective otitis externa, unspecified ear: Secondary | ICD-10-CM

## 2010-09-30 ENCOUNTER — Institutional Professional Consult (permissible substitution): Payer: Self-pay | Admitting: Pulmonary Disease

## 2010-11-04 ENCOUNTER — Institutional Professional Consult (permissible substitution): Payer: BC Managed Care – PPO | Admitting: Pulmonary Disease

## 2010-11-18 ENCOUNTER — Encounter: Payer: Self-pay | Admitting: Pulmonary Disease

## 2010-11-19 ENCOUNTER — Encounter: Payer: Self-pay | Admitting: Pulmonary Disease

## 2010-11-19 ENCOUNTER — Ambulatory Visit (INDEPENDENT_AMBULATORY_CARE_PROVIDER_SITE_OTHER): Payer: BC Managed Care – PPO | Admitting: Pulmonary Disease

## 2010-11-19 VITALS — BP 132/82 | HR 93 | Temp 98.1°F | Ht 70.0 in | Wt 267.8 lb

## 2010-11-19 DIAGNOSIS — G4733 Obstructive sleep apnea (adult) (pediatric): Secondary | ICD-10-CM

## 2010-11-19 DIAGNOSIS — J45909 Unspecified asthma, uncomplicated: Secondary | ICD-10-CM

## 2010-11-19 DIAGNOSIS — R062 Wheezing: Secondary | ICD-10-CM

## 2010-11-19 HISTORY — DX: Obstructive sleep apnea (adult) (pediatric): G47.33

## 2010-11-19 MED ORDER — ALBUTEROL SULFATE HFA 108 (90 BASE) MCG/ACT IN AERS: 2.0000 | INHALATION_SPRAY | Freq: Four times a day (QID) | RESPIRATORY_TRACT | Status: DC | PRN

## 2010-11-19 MED ORDER — BECLOMETHASONE DIPROPIONATE 80 MCG/ACT IN AERS: 2.0000 | INHALATION_SPRAY | Freq: Two times a day (BID) | RESPIRATORY_TRACT | Status: DC

## 2010-11-19 NOTE — Progress Notes (Signed)
Subjective:    Patient ID: [name] [name], male    DOB: 06-Oct-1965, 45 y.o.   MRN: [id number]  HPI  45 yo male for sleep evaluation.  His wife is a patient of mine for sleep apnea.  She was concerned he has similar sleep trouble.  This has been present for years, but has been getting worse.  He snores, and wakes up with a choking sensation.  He has been told he stops breathing.  He feels tired all the time.  He goes to bed at 10 pm.  He falls asleep quickly.  He wakes up several times to use the bathroom.  He gets out of bed at 7 am.  He denies morning headaches.  He does not use anything to help sleep, but does use energy drinks in the morning to help get going.  He can fall asleep easily when sitting quiet.  The patient denies sleep walking, sleep talking, bruxism, or nightmares.  There is no history of restless legs.  The patient denies sleep hallucinations, sleep paralysis, or cataplexy.  His weight has been steady.  He does not smoke.  He has occasional alcohol use.  He has seasonal allergies.  He gets episodes of chest tightness, wheezing, and cough with clear sputum.  He was told he has asthma.  He does not recall if he had breathing tests or allergy tests before.  He has not been on inhaler therapy before.  He has been noticing more wheezing over the past few weeks.  Epworth score is 8 out of 24.  Past Medical History  Diagnosis Date  . NASH (nonalcoholic steatohepatitis)   . Numbness   . Cough   . Lumbar back pain   . Hypothyroidism   . Hyperlipidemia   . Hypertension   . Diabetes mellitus type II   . ASTHMA 10/28/2006     Family History  Problem Relation Age of Onset  . Osteoarthritis Father   . Heart attack Father   . Breast cancer Mother   . Skin cancer Father      History   Social History  . Marital Status: Married    Spouse Name: N/A    Number of Children: 2  . Years of Education: N/A   Occupational History  . [employer]   Social  History Main Topics  . Smoking status: Never Smoker   . Smokeless tobacco: Not on file  . Alcohol Use: Yes     4 beers per week  . Drug Use: No  . Sexually Active: Not on file   Other Topics Concern  . Not on file   Social History Narrative  . No narrative on file     No Known Allergies   Review of Systems  Respiratory: Positive for shortness of breath.       Acid heartburn/indigestion  Objective:   Physical Exam  BP 132/82  Pulse 93  Temp(Src) 98.1 F (36.7 C) (Oral)  Ht 5\' 10"  (1.778 m)  Wt 267 lb 12.8 oz (121.473 kg)  BMI 38.43 kg/m2  SpO2 93%  General - Obese HEENT - PERRLA, EOMI, No sinus tenderness, MP 4, scalloped tongue, no exudate, no LAN Cardiac - s1s2 regular, no murmur Chest - b/l expiratory wheeze, no rales Abd - obese, soft, nontender Ext - no e/c/c Neuro - normal strength, CN intact Psych - normal mood/behavior Skin - no rash  Spirometry 11/19/10>>FEV1 3.04(75%), FEV1% 74     Assessment &  Plan:   OSA (obstructive sleep apnea) He has snoring, witness apnea, sleep disruption, and daytime sleepiness.  He has history of hypertension, diabetes, and anxiety.  I am concerned he could have sleep apnea.  I explained how sleep apnea can affect the patient's health.  Driving precautions and importance of weight loss were discussed.  Treatment options for sleep apnea were reviewed.  To further assess will arrange for in lab sleep study.  ASTHMA He has mild asthmatic bronchitis.  I don't think he needs antibiotics or prednisone.  Will give him trial of Qvar 80 mcg two puffs bid and prn proair.  Will reassess at next visit.   Updated Medication List Outpatient Encounter Prescriptions as of 11/19/2010  Medication Sig Dispense Refill  . aspirin 81 MG tablet Take 81 mg by mouth daily.        Marland Kitchen atorvastatin (LIPITOR) 40 MG tablet Take 40 mg by mouth daily.        . citalopram (CELEXA) 20 MG tablet 1 1/2 at bedtime       . glucose blood (ACCU-CHEK COMPACT  STRIPS) test strip Use as instructed       . HYZAAR 100-25 MG per tablet TAKE 1 TABLET IN THE     MORNING.  60 each  0  . insulin glargine (LANTUS) 100 UNIT/ML injection Inject 60 Units into the skin 2 (two) times daily.        . insulin lispro (HUMALOG KWIKPEN) 100 UNIT/ML injection 50 units not exceeding 200 units a day      . levothyroxine (SYNTHROID, LEVOTHROID) 125 MCG tablet Take 125 mcg by mouth daily.        . metFORMIN (GLUCOPHAGE) 1000 MG tablet 2 tablets at bedtime      . omeprazole (PRILOSEC) 20 MG capsule Take 20 mg by mouth daily.        Marland Kitchen DISCONTD: simvastatin (ZOCOR) 20 MG tablet Take 20 mg by mouth at bedtime.        Marland Kitchen albuterol (PROAIR HFA) 108 (90 BASE) MCG/ACT inhaler Inhale 2 puffs into the lungs every 6 (six) hours as needed for wheezing.  1 Inhaler  3  . beclomethasone (QVAR) 80 MCG/ACT inhaler Inhale 2 puffs into the lungs 2 (two) times daily.  1 Inhaler  3  . DISCONTD: celecoxib (CELEBREX) 200 MG capsule Take 200 mg by mouth daily.

## 2010-11-19 NOTE — Assessment & Plan Note (Signed)
He has snoring, witness apnea, sleep disruption, and daytime sleepiness.  He has history of hypertension, diabetes, and anxiety.  I am concerned he could have sleep apnea.  I explained how sleep apnea can affect the patient's health.  Driving precautions and importance of weight loss were discussed.  Treatment options for sleep apnea were reviewed.  To further assess will arrange for in lab sleep study.

## 2010-11-19 NOTE — Patient Instructions (Signed)
Qvar two puffs twice per day for 10 days, and then as needed.  Rinse mouth after each use Proair two puffs as needed for cough, wheeze, or chest congestion Will schedule sleep test Will call to schedule follow up after sleep test reviewed

## 2010-11-19 NOTE — Assessment & Plan Note (Signed)
He has mild asthmatic bronchitis.  I don't think he needs antibiotics or prednisone.  Will give him trial of Qvar 80 mcg two puffs bid and prn proair.  Will reassess at next visit.

## 2010-12-13 ENCOUNTER — Ambulatory Visit (HOSPITAL_BASED_OUTPATIENT_CLINIC_OR_DEPARTMENT_OTHER): Payer: BC Managed Care – PPO | Attending: Pulmonary Disease

## 2010-12-13 DIAGNOSIS — Z794 Long term (current) use of insulin: Secondary | ICD-10-CM | POA: Insufficient documentation

## 2010-12-13 DIAGNOSIS — I10 Essential (primary) hypertension: Secondary | ICD-10-CM | POA: Insufficient documentation

## 2010-12-13 DIAGNOSIS — Z79899 Other long term (current) drug therapy: Secondary | ICD-10-CM | POA: Insufficient documentation

## 2010-12-13 DIAGNOSIS — G4733 Obstructive sleep apnea (adult) (pediatric): Secondary | ICD-10-CM | POA: Insufficient documentation

## 2010-12-13 DIAGNOSIS — E119 Type 2 diabetes mellitus without complications: Secondary | ICD-10-CM | POA: Insufficient documentation

## 2010-12-21 NOTE — Procedures (Signed)
NAME:  [name] [name], [name] [name] NO.:  [id number]  MEDICAL RECORD NO.:  [id number]          PATIENT TYPE:  OUT  LOCATION:  SLEEP CENTER                 FACILITY:  [facility]  PHYSICIAN:  Coralyn Helling, MD        DATE OF BIRTH:  06/01/1965  DATE OF STUDY:  12/13/2010                           NOCTURNAL POLYSOMNOGRAM  REFERRING PHYSICIAN:  Coralyn Helling, MD  INDICATION:  [name] is a 45 year old male who has a history of hypertension and diabetes.  He also has symptoms of snoring, sleep disruption, and daytime sleepiness.  He is therefore referred to sleep lab for evaluation of hypersomnia with obstructive sleep apnea.  Height is 5 feet 10 inches, weight is 265 pounds.  BMI is 38.  Neck size is 17 inches.  MEDICATIONS:  Aspirin, Lipitor, Celexa, Hyzaar, Humalog, Synthroid, Glucophage, Prilosec, ProAir, and QVAR.  EPWORTH SLEEPINESS SCORE:  9..  SLEEP ARCHITECTURE:  The patient followed a split night study protocol. During the diagnostic portion of study, total recording time was 279 minutes.  Total sleep time was 183 minutes.  Sleep efficiency was 65%. Sleep latency was 86 minutes.  REM latency was 128 minutes.  The patient was observed in all stages of sleep and slept exclusively in the non- supine position.  During the titration portion of the study, total recording time was 189 minutes.  Total sleep time was 148 minutes.  Sleep efficiency was 78%. Sleep latency was 36 minutes.  REM latency was 68 minutes.  This portion of the study was notable for the lack of stage III sleep and he slept in both supine and non-supine positions.  RESPIRATORY DATA:  The average respiratory rate was 18.  Loud snoring was noted by the technician.  During the diagnostic portion of the study, the overall apnea/hypopnea index was 14.4.  The events were exclusively obstructive in nature.  The REM apnea/hypopnea index was 22.5.  The non-REM apnea/hypopnea index was 13.7.  During the  titration portion of the study, the patient was started on CPAP of 5 and increased to 17 cm of water.  With CPAP set at 10 cm of water, he had improvement in his obstructive sleep apnea and both observed in REM sleep and supine sleep.  Of note is that with higher pressure settings, he developed central apneas.  CARDIAC DATA:  The average heart rate was 80 and the rhythm strip showed normal sinus rhythm.  OXYGEN DATA:  The baseline oxygenation was 95%.  The oxygen saturation nadir was 81%.  The study was conducted without the use of supplemental oxygen.  MOVEMENT/PARASOMNIA:  The periodic limb movement index was 0 and the patient had no restroom trips.  IMPRESSION:  This study shows evidence for mild-to-moderate obstructive sleep apnea.  He had good control of his sleep disordered breathing with CPAP set at 10 cm of water.  Of note is that with higher pressures, he developed central apneic events.  In addition to diet, exercise, and weight reduction, I would recommend that the patient be started on CPAP at 10 cm of water and monitored for his clinical response.     Coralyn Helling, MD Diplomat, American Board of Sleep Medicine  VS/MEDQ  D:  12/21/2010 12:51:03  T:  12/21/2010 21:18:59  Job:  [id number]

## 2010-12-22 ENCOUNTER — Telehealth: Payer: Self-pay | Admitting: Pulmonary Disease

## 2010-12-22 DIAGNOSIS — G4761 Periodic limb movement disorder: Secondary | ICD-10-CM

## 2010-12-22 DIAGNOSIS — G4733 Obstructive sleep apnea (adult) (pediatric): Secondary | ICD-10-CM

## 2010-12-22 NOTE — Telephone Encounter (Signed)
PSG 12/13/10>>AHI 14.4, SpO2 low 81%.  CPAP 10 cm, +R, +S>>centrals with higher pressures  Will have my nurse schedule ROV to review results.

## 2010-12-22 NOTE — Telephone Encounter (Signed)
[email or web address]  

## 2010-12-23 NOTE — Telephone Encounter (Signed)
Pt is coming in 8/25 at 1:30

## 2010-12-28 ENCOUNTER — Ambulatory Visit (INDEPENDENT_AMBULATORY_CARE_PROVIDER_SITE_OTHER): Payer: BC Managed Care – PPO | Admitting: Pulmonary Disease

## 2010-12-28 ENCOUNTER — Encounter: Payer: Self-pay | Admitting: Pulmonary Disease

## 2010-12-28 VITALS — BP 118/78 | HR 95 | Temp 97.8°F | Ht 70.0 in | Wt 270.8 lb

## 2010-12-28 DIAGNOSIS — G4733 Obstructive sleep apnea (adult) (pediatric): Secondary | ICD-10-CM

## 2010-12-28 DIAGNOSIS — J45909 Unspecified asthma, uncomplicated: Secondary | ICD-10-CM

## 2010-12-28 NOTE — Assessment & Plan Note (Signed)
Improved.  Will continue prn proair for now.

## 2010-12-28 NOTE — Progress Notes (Signed)
Subjective:    Patient ID: [name] [name], male    DOB: Sep 16, 1965, 45 y.o.   MRN: [id number]  HPI 45 yo male with sleep apnea and asthma.  He is here to review PSG from 12/13/10>>AHI 14.4, SpO2 low 81%.  CPAP 10 cm, +R, +S>>centrals with higher pressures.  He feels his breathing is better.  He still gets occasional wheeze.  He felt Qvar helped some.  He has not been using proair much.  Past Medical History  Diagnosis Date  . NASH (nonalcoholic steatohepatitis)   . Numbness   . Cough   . Lumbar back pain   . Hypothyroidism   . Hyperlipidemia   . Hypertension   . Diabetes mellitus type II   . ASTHMA 10/28/2006  . OSA (obstructive sleep apnea) 11/19/2010    Family History  Problem Relation Age of Onset  . Osteoarthritis Father   . Heart attack Father   . Breast cancer Mother   . Skin cancer Father     History   Social History  . Marital Status: Married    Number of Children: 2   Occupational History  . [employer]   Social History Main Topics  . Smoking status: Never Smoker   . Alcohol Use: Yes     4 beers per week  . Drug Use: No   No Known Allergies   Review of Systems     Objective:   Physical Exam  BP 118/78  Pulse 95  Temp(Src) 97.8 F (36.6 C) (Oral)  Ht 5\' 10"  (1.778 m)  Wt 270 lb 12.8 oz (122.834 kg)  BMI 38.86 kg/m2  SpO2 97%  General - Obese  HEENT - PERRLA, EOMI, No sinus tenderness, MP 4, scalloped tongue, no exudate, no LAN  Cardiac - s1s2 regular, no murmur  Chest - no wheeze, no rales  Abd - obese, soft, nontender  Ext - no e/c/c  Neuro - normal strength, CN intact  Psych - normal mood/behavior  Skin - no rash       Assessment & Plan:   OSA (obstructive sleep apnea) He has mild to moderate sleep apnea.  I have reviewed his sleep test results with the patient.  Explained how sleep apnea can affect the patient's health.  Driving precautions and importance of weight loss were discussed.  Treatment options  for sleep apnea were reviewed.  He did well with CPAP in sleep lab.  Will proceed with CPAP 10 cm H2O.  ASTHMA Improved.  Will continue prn proair for now.    Updated Medication List Outpatient Encounter Prescriptions as of 12/28/2010  Medication Sig Dispense Refill  . albuterol (PROAIR HFA) 108 (90 BASE) MCG/ACT inhaler Inhale 2 puffs into the lungs every 6 (six) hours as needed for wheezing.  1 Inhaler  3  . aspirin 81 MG tablet Take 81 mg by mouth daily.        Marland Kitchen atorvastatin (LIPITOR) 40 MG tablet Take 40 mg by mouth daily.        . citalopram (CELEXA) 20 MG tablet 1 1/2 at bedtime       . glucose blood (ACCU-CHEK COMPACT STRIPS) test strip Use as instructed       . HYZAAR 100-25 MG per tablet TAKE 1 TABLET IN THE     MORNING.  60 each  0  . insulin glargine (LANTUS) 100 UNIT/ML injection Inject 60 Units into the skin 2 (two) times daily.        Marland Kitchen  insulin lispro (HUMALOG KWIKPEN) 100 UNIT/ML injection 50 units not exceeding 200 units a day      . levothyroxine (SYNTHROID, LEVOTHROID) 125 MCG tablet Take 125 mcg by mouth daily.        . metFORMIN (GLUCOPHAGE) 1000 MG tablet 2 tablets at bedtime      . omeprazole (PRILOSEC) 20 MG capsule Take 20 mg by mouth daily.        Marland Kitchen DISCONTD: beclomethasone (QVAR) 80 MCG/ACT inhaler Inhale 2 puffs into the lungs 2 (two) times daily.  1 Inhaler  3

## 2010-12-28 NOTE — Assessment & Plan Note (Signed)
He has mild to moderate sleep apnea.  I have reviewed his sleep test results with the patient.  Explained how sleep apnea can affect the patient's health.  Driving precautions and importance of weight loss were discussed.  Treatment options for sleep apnea were reviewed.  He did well with CPAP in sleep lab.  Will proceed with CPAP 10 cm H2O.

## 2010-12-28 NOTE — Patient Instructions (Signed)
Will set up CPAP at home Proair two puffs as needed for cough, wheeze, chest congestion, or chest tightness Follow up in 2 months

## 2011-01-02 ENCOUNTER — Other Ambulatory Visit: Payer: Self-pay | Admitting: Family Medicine

## 2011-02-08 ENCOUNTER — Other Ambulatory Visit: Payer: Self-pay | Admitting: Family Medicine

## 2011-02-17 ENCOUNTER — Other Ambulatory Visit (HOSPITAL_COMMUNITY): Payer: Self-pay | Admitting: Orthopedic Surgery

## 2011-02-18 ENCOUNTER — Encounter (HOSPITAL_COMMUNITY): Payer: Self-pay

## 2011-02-18 NOTE — H&P (Signed)
NAME:  [name], [name] NO.:  [id number]  MEDICAL RECORD NO.:  [id number]  LOCATION:  [facility]                        FACILITY:  [facility]  PHYSICIAN:  Burnard Bunting, M.D.    DATE OF BIRTH:  [birth year]  DATE OF ADMISSION:  02/16/2011 DATE OF DISCHARGE:                             HISTORY & PHYSICAL   CHIEF COMPLAINT:  Right ring finger triggering.  HISTORY OF PRESENT ILLNESS:  [name] [name] is a 45 year old patient with right ring finger triggering.  It has been going on for about a year but it has become painful over the past several months.  Notably, the patient had long finger release of triggering done in 2009, he did have failure of nonoperative management including injections as part of that workup of the right long finger.  This ring finger is becoming very symptomatic for him.  He works in [employer] as a Production designer, theatre/television/film and it interferes in his ability to type as well as to do activities of daily living.  It is interfering with his sleep, in terms of the finger locking up and becoming quite safe after he unlocks it and keeping him from returning his sleep.  He states he cannot really grasp anything.  CURRENT MEDICATIONS:  Include Humalog insulin, Glucophage, Lipitor, Prilosec, low-dose aspirin, Benicar.  ALLERGIES:  He has no known drug allergies.  PAST SURGICAL HISTORY:  Prior surgeries include a right knee arthroscopy 4 times, last one was in 2009.  She had a partial thyroidectomy, right shoulder surgery 10 years ago.  Left shoulder surgery 8 years ago.  FAMILY MEDICAL HISTORY:  Positive for cancer, high blood pressure.  The patient is married.  Does not smoke.  Occasionally drinks.  He works as a Production designer, theatre/television/film.  REVIEW OF SYSTEMS:  His all other systems reviewed and are negative other than they related to the right finger.  PHYSICAL EXAMINATION:  GENERAL: He is well developed, well nourished, in no acute distress, alert and oriented.  Slightly increased body  mass index. VITAL SIGNS: Blood pressure 125/89, height 5 feet 10 inches, and weight 270. CHEST: Clear to auscultation. HEART:  Regular rate and rhythm. ABDOMEN:  Benign. EXTREMITIES:  Right ring finger demonstrates tenderness over the A1 pulley.  He does have demonstrable triggering every time he flexes past 90 degrees at the PIP joint.  His motor sensory function of the finger is intact.  His incision from the 3rd finger trigger release is well healed.  IMPRESSION:  Right ring trigger finger.  PLAN:  Positively explained about the operative and nonoperative measures.  He has failed nonoperative measures in the long finger and desires surgical correction.  I think that is a reasonable option, duration of symptoms and the recent worsening of his pain.  The risks and benefits of surgery I discussed with the patient including but not limited to infection, nerve and vessel damage, recurrent triggering as well as weakness.  All questions were answered.  Expect 85% to 90% chance of the good result from surgery likely be out of work for a couple of days.  I advised him not to do heavy lifting for the first 10 days after surgery until the suture is healed.  All questions were  answered.     Burnard Bunting, M.D.     GSD/MEDQ  D:  02/17/2011  T:  02/17/2011  Job:  [id number]

## 2011-02-21 ENCOUNTER — Encounter (HOSPITAL_COMMUNITY): Payer: Self-pay

## 2011-02-21 ENCOUNTER — Encounter (HOSPITAL_COMMUNITY)
Admission: RE | Admit: 2011-02-21 | Discharge: 2011-02-21 | Disposition: A | Discharge status: Home / Self Care | Payer: BC Managed Care – PPO | Source: Ambulatory Visit | Attending: Orthopedic Surgery | Admitting: Orthopedic Surgery

## 2011-02-21 ENCOUNTER — Ambulatory Visit (HOSPITAL_COMMUNITY)
Admission: RE | Admit: 2011-02-21 | Discharge: 2011-02-21 | Disposition: A | Discharge status: Home / Self Care | Payer: BC Managed Care – PPO | Source: Ambulatory Visit | Attending: Anesthesiology | Admitting: Anesthesiology

## 2011-02-21 ENCOUNTER — Other Ambulatory Visit: Payer: Self-pay

## 2011-02-21 DIAGNOSIS — E119 Type 2 diabetes mellitus without complications: Secondary | ICD-10-CM | POA: Insufficient documentation

## 2011-02-21 DIAGNOSIS — Z01810 Encounter for preprocedural cardiovascular examination: Secondary | ICD-10-CM | POA: Insufficient documentation

## 2011-02-21 DIAGNOSIS — M6530 Trigger finger, unspecified finger: Secondary | ICD-10-CM | POA: Insufficient documentation

## 2011-02-21 DIAGNOSIS — Z01818 Encounter for other preprocedural examination: Secondary | ICD-10-CM | POA: Insufficient documentation

## 2011-02-21 DIAGNOSIS — I10 Essential (primary) hypertension: Secondary | ICD-10-CM | POA: Insufficient documentation

## 2011-02-21 DIAGNOSIS — Z01812 Encounter for preprocedural laboratory examination: Secondary | ICD-10-CM | POA: Insufficient documentation

## 2011-02-21 HISTORY — DX: Anxiety disorder, unspecified: F41.9

## 2011-02-21 LAB — BASIC METABOLIC PANEL
GFR calc non Af Amer: >90 mL/min (ref >90)
Glucose, Bld: 197 mg/dL — ABNORMAL HIGH (ref 70–99)
Potassium: 3.7 mEq/L (ref 3.5–5.1)
Sodium: 137 mEq/L (ref 135–145)

## 2011-02-21 LAB — SURGICAL PCR SCREEN
MRSA, PCR: NEGATIVE
Staphylococcus aureus: NEGATIVE

## 2011-02-21 LAB — CBC
Hemoglobin: 16.2 g/dL (ref 13.0–17.0)
MCHC: 35.8 g/dL (ref 30.0–36.0)
RBC: 5.11 MIL/uL (ref 4.22–5.81)

## 2011-02-21 NOTE — Pre-Procedure Instructions (Signed)
20 [name] [name]  02/21/2011   Your procedure is scheduled on:  02/23/11  Report to [facility] at 830 AM.  Call this number if you have problems the morning of surgery: [phone]   Remember:   Do not eat food:After Midnight.  Do not drink clear liquids: 4 Hours before arrival.  Take these medicines the morning of surgery with A SIP OF WATER: MAY USE YOUR INHALERS--SYNTHROID,PRILOSEC   Do not wear jewelry, make-up or nail polish.  Do not wear lotions, powders, or perfumes. You may wear deodorant.  Do not shave 48 hours prior to surgery.  Do not bring valuables to the hospital.  Contacts, dentures or bridgework may not be worn into surgery.  Leave suitcase in the car. After surgery it may be brought to your room.  For patients admitted to the hospital, checkout time is 11:00 AM the day of discharge.   Patients discharged the day of surgery will not be allowed to drive home.  Name and phone number of your driver: FAMILY  Special Instructions: CHG Shower Use Special Wash: 1/2 bottle night before surgery and 1/2 bottle morning of surgery.   Please read over the following fact sheets that you were given: Pain Booklet, MRSA Information and Surgical Site Infection Prevention

## 2011-02-21 NOTE — Progress Notes (Signed)
SLEEP STUDY REQ. FROM [employer]

## 2011-02-21 NOTE — Progress Notes (Signed)
DR TODD IS PCP,BUT PT HAS NOT SEEN HIM IN MORE THAN 3 YRS

## 2011-02-22 MED ORDER — CEFAZOLIN SODIUM-DEXTROSE 2-3 GM-% IV SOLR
2.0000 g | INTRAVENOUS | Status: AC
  Administered 2011-02-23: 2 g via INTRAVENOUS
  Filled 2011-02-22: qty 50

## 2011-02-23 ENCOUNTER — Encounter (HOSPITAL_COMMUNITY): Payer: Self-pay | Admitting: Anesthesiology

## 2011-02-23 ENCOUNTER — Encounter (HOSPITAL_COMMUNITY): Payer: Self-pay | Admitting: Certified Registered"

## 2011-02-23 ENCOUNTER — Ambulatory Visit (HOSPITAL_COMMUNITY)
Admission: RE | Admit: 2011-02-23 | Discharge: 2011-02-23 | Disposition: A | Discharge status: Home / Self Care | Payer: BC Managed Care – PPO | Source: Ambulatory Visit | Attending: Orthopedic Surgery | Admitting: Orthopedic Surgery

## 2011-02-23 ENCOUNTER — Encounter (HOSPITAL_COMMUNITY)
Admission: RE | Disposition: A | Discharge status: Home / Self Care | Payer: Self-pay | Source: Ambulatory Visit | Attending: Orthopedic Surgery

## 2011-02-23 ENCOUNTER — Encounter (HOSPITAL_COMMUNITY): Payer: Self-pay | Admitting: *Deleted

## 2011-02-23 ENCOUNTER — Ambulatory Visit (HOSPITAL_COMMUNITY): Payer: BC Managed Care – PPO | Admitting: Certified Registered"

## 2011-02-23 DIAGNOSIS — I10 Essential (primary) hypertension: Secondary | ICD-10-CM | POA: Insufficient documentation

## 2011-02-23 DIAGNOSIS — E119 Type 2 diabetes mellitus without complications: Secondary | ICD-10-CM | POA: Insufficient documentation

## 2011-02-23 DIAGNOSIS — Z794 Long term (current) use of insulin: Secondary | ICD-10-CM | POA: Insufficient documentation

## 2011-02-23 DIAGNOSIS — G4730 Sleep apnea, unspecified: Secondary | ICD-10-CM | POA: Insufficient documentation

## 2011-02-23 DIAGNOSIS — M6530 Trigger finger, unspecified finger: Secondary | ICD-10-CM | POA: Insufficient documentation

## 2011-02-23 DIAGNOSIS — J45909 Unspecified asthma, uncomplicated: Secondary | ICD-10-CM

## 2011-02-23 HISTORY — PX: TRIGGER FINGER RELEASE: SHX641

## 2011-02-23 LAB — GLUCOSE, CAPILLARY
Glucose-Capillary: 245 mg/dL — ABNORMAL HIGH (ref 70–99)
Glucose-Capillary: 196 mg/dL — ABNORMAL HIGH (ref 70–99)

## 2011-02-23 SURGERY — RELEASE, A1 PULLEY, FOR TRIGGER FINGER
Anesthesia: Regional | Site: Finger | Laterality: Right | Wound class: Clean

## 2011-02-23 MED ORDER — LIDOCAINE HCL (PF) 0.5 % IJ SOLN
INTRAMUSCULAR | Status: DC | PRN
  Administered 2011-02-23: 30 mL

## 2011-02-23 MED ORDER — MIDAZOLAM HCL 5 MG/5ML IJ SOLN
INTRAMUSCULAR | Status: DC | PRN
  Administered 2011-02-23: 2 mg via INTRAVENOUS

## 2011-02-23 MED ORDER — BUPIVACAINE HCL (PF) 0.25 % IJ SOLN
INTRAMUSCULAR | Status: DC | PRN
  Administered 2011-02-23: 5 mL

## 2011-02-23 MED ORDER — ONDANSETRON HCL 4 MG/2ML IJ SOLN: 4.0000 mg | Freq: Once | INTRAMUSCULAR | Status: DC | PRN

## 2011-02-23 MED ORDER — FENTANYL CITRATE 0.05 MG/ML IJ SOLN
INTRAMUSCULAR | Status: DC | PRN
  Administered 2011-02-23: 100 ug via INTRAVENOUS
  Administered 2011-02-23: 25 ug via INTRAVENOUS

## 2011-02-23 MED ORDER — ONDANSETRON HCL 4 MG/2ML IJ SOLN
INTRAMUSCULAR | Status: DC | PRN
  Administered 2011-02-23: 4 mg via INTRAVENOUS

## 2011-02-23 MED ORDER — HYDROCODONE-ACETAMINOPHEN 10-325 MG PO TABS: 1.0000 | ORAL_TABLET | Freq: Four times a day (QID) | ORAL | Status: AC | PRN

## 2011-02-23 MED ORDER — SODIUM CHLORIDE 0.9 % IR SOLN
Status: DC | PRN
  Administered 2011-02-23: 1000 mL

## 2011-02-23 MED ORDER — LACTATED RINGERS IV SOLN
INTRAVENOUS | Status: DC
  Administered 2011-02-23: 10:00:00 via INTRAVENOUS

## 2011-02-23 MED ORDER — HYDROMORPHONE HCL PF 1 MG/ML IJ SOLN: 0.2500 mg | INTRAMUSCULAR | Status: DC | PRN

## 2011-02-23 SURGICAL SUPPLY — 40 items
BANDAGE ELASTIC 3 VELCRO ST LF (GAUZE/BANDAGES/DRESSINGS) ×2 IMPLANT
BANDAGE ELASTIC 4 VELCRO ST LF (GAUZE/BANDAGES/DRESSINGS) IMPLANT
BANDAGE GAUZE ELAST BULKY 4 IN (GAUZE/BANDAGES/DRESSINGS) IMPLANT
BNDG ESMARK 4X9 LF (GAUZE/BANDAGES/DRESSINGS) IMPLANT
CLOTH BEACON ORANGE TIMEOUT ST (SAFETY) ×2 IMPLANT
CORDS BIPOLAR (ELECTRODE) ×2 IMPLANT
COVER SURGICAL LIGHT HANDLE (MISCELLANEOUS) ×2 IMPLANT
CUFF TOURNIQUET SINGLE 18IN (TOURNIQUET CUFF) ×2 IMPLANT
CUFF TOURNIQUET SINGLE 24IN (TOURNIQUET CUFF) IMPLANT
DRAPE SURG 17X23 STRL (DRAPES) ×2 IMPLANT
DURAPREP 26ML APPLICATOR (WOUND CARE) ×2 IMPLANT
GAUZE XEROFORM 1X8 LF (GAUZE/BANDAGES/DRESSINGS) IMPLANT
GLOVE BIOGEL PI IND STRL 8 (GLOVE) ×1 IMPLANT
GLOVE BIOGEL PI INDICATOR 8 (GLOVE) ×1
GLOVE SURG ORTHO 8.0 STRL STRW (GLOVE) ×2 IMPLANT
GOWN PREVENTION PLUS LG XLONG (DISPOSABLE) IMPLANT
GOWN PREVENTION PLUS XLARGE (GOWN DISPOSABLE) ×2 IMPLANT
GOWN STRL NON-REIN LRG LVL3 (GOWN DISPOSABLE) ×4 IMPLANT
KIT BASIN OR (CUSTOM PROCEDURE TRAY) ×2 IMPLANT
KIT ROOM TURNOVER OR (KITS) ×2 IMPLANT
LOOP VESSEL MAXI BLUE (MISCELLANEOUS) IMPLANT
NEEDLE HYPO 25GX1X1/2 BEV (NEEDLE) ×2 IMPLANT
NS IRRIG 1000ML POUR BTL (IV SOLUTION) ×2 IMPLANT
PACK ORTHO EXTREMITY (CUSTOM PROCEDURE TRAY) ×2 IMPLANT
PAD ARMBOARD 7.5X6 YLW CONV (MISCELLANEOUS) ×2 IMPLANT
PAD CAST 4YDX4 CTTN HI CHSV (CAST SUPPLIES) ×1 IMPLANT
PADDING CAST COTTON 4X4 STRL (CAST SUPPLIES) ×1
SPONGE GAUZE 4X4 12PLY (GAUZE/BANDAGES/DRESSINGS) IMPLANT
SUCTION FRAZIER TIP 10 FR DISP (SUCTIONS) ×2 IMPLANT
SUT ETHILON 3 0 PS 1 (SUTURE) ×2 IMPLANT
SUT VIC AB 2-0 CT1 27 (SUTURE) ×1
SUT VIC AB 2-0 CT1 TAPERPNT 27 (SUTURE) ×1 IMPLANT
SUT VIC AB 3-0 FS2 27 (SUTURE) ×2 IMPLANT
SYR CONTROL 10ML LL (SYRINGE) ×2 IMPLANT
SYSTEM CHEST DRAIN TLS 7FR (DRAIN) IMPLANT
TOWEL OR 17X24 6PK STRL BLUE (TOWEL DISPOSABLE) ×2 IMPLANT
TOWEL OR 17X26 10 PK STRL BLUE (TOWEL DISPOSABLE) ×2 IMPLANT
TUBE CONNECTING 12X1/4 (SUCTIONS) ×2 IMPLANT
UNDERPAD 30X30 INCONTINENT (UNDERPADS AND DIAPERS) ×2 IMPLANT
WATER STERILE IRR 1000ML POUR (IV SOLUTION) IMPLANT

## 2011-02-23 NOTE — Anesthesia Procedure Notes (Signed)
Anesthesia Regional Block:  Bier block (IV Regional)  Pre-Anesthetic Checklist: ,, timeout performed, Correct Patient, Correct Site, Correct Laterality, Correct Procedure, Correct Position, site marked, Risks and benefits discussed,  Surgical consent,  Pre-op evaluation,  At surgeon's request and post-op pain management  Laterality: Right  Prep: chloraprep       Bier block (IV Regional) Narrative:  Start time: 02/23/2011 10:59 AM End time: 02/23/2011 11:00 AM  Performed by: With CRNAs  Resident/CRNA: K.Catelyn Friel CRNA  Additional Notes: #22g PIV started right hand. Tourniquet to right FA; tourniquet checked for function. Right arm exsanguinated with esmark. Tourniquet up to . - radial pulse; + blanching of fingers. 30cc 0.5% Lido plain injected. Patient tolerate well; no side effects noted. 22g PIV d/c after injection; catheter intact. VSS.   Bier block (IV Regional)

## 2011-02-23 NOTE — Interval H&P Note (Signed)
History and Physical Interval Note:   02/23/2011   10:45 AM   [name] [name]  has presented today for surgery, with the diagnosis of Right 4th trigger finger  The various methods of treatment have been discussed with the patient and family. After consideration of risks, benefits and other options for treatment, the patient has consented to  Procedure(s): RELEASE TRIGGER FINGER/A-1 PULLEY as a surgical intervention .  The patients' history has been reviewed, patient examined, no change in status, stable for surgery.  I have reviewed the patients' chart and labs.  Questions were answered to the patient's satisfaction.     Cammy Copa  MD

## 2011-02-23 NOTE — Anesthesia Preprocedure Evaluation (Addendum)
Anesthesia Evaluation  Patient identified by MRN, date of birth, ID band Patient awake    Reviewed: Allergy & Precautions, H&P , NPO status , Patient's Chart, lab work & pertinent test results  Airway Mallampati: II TM Distance: >3 FB Neck ROM: full    Dental  (+) Teeth Intact   Pulmonary asthma , sleep apnea (not compliant) and Continuous Positive Airway Pressure Ventilation ,    Pulmonary exam normal       Cardiovascular Exercise Tolerance: Good hypertension, Pt. on medications neg cardio ROS regular Normal    Neuro/Psych Negative Neurological ROS  Negative Psych ROS   GI/Hepatic negative GI ROS, (+) Hepatitis -, Unspecified  Endo/Other  Diabetes mellitus-, Poorly Controlled, Type 2, Insulin Dependent and Oral Hypoglycemic AgentsHypothyroidism   Renal/GU negative Renal ROS  Genitourinary negative   Musculoskeletal negative musculoskeletal ROS (+)   Abdominal   Peds  Hematology negative hematology ROS (+)   Anesthesia Other Findings   Reproductive/Obstetrics negative OB ROS                         Anesthesia Physical Anesthesia Plan  ASA: III  Anesthesia Plan: Regional   Post-op Pain Management:    Induction: Intravenous  Airway Management Planned: Mask  Additional Equipment:   Intra-op Plan:   Post-operative Plan: Extubation in OR  Informed Consent: I have reviewed the patients History and Physical, chart, labs and discussed the procedure including the risks, benefits and alternatives for the proposed anesthesia with the patient or authorized representative who has indicated his/her understanding and acceptance.   Dental advisory given  Plan Discussed with: Anesthesiologist, CRNA and Surgeon  Anesthesia Plan Comments:       Anesthesia Quick Evaluation

## 2011-02-23 NOTE — H&P (Signed)
   [id number] dictated

## 2011-02-23 NOTE — Transfer of Care (Signed)
Immediate Anesthesia Transfer of Care Note  Patient: [name] [name]  Procedure(s) Performed:  RELEASE TRIGGER FINGER/A-1 PULLEY - Right 4th trigger finger release  Patient Location: PACU  Anesthesia Type: Bier block  Level of Consciousness: awake, alert  and oriented  Airway & Oxygen Therapy: Patient Spontanous Breathing  Post-op Assessment: Report given to PACU RN and Post -op Vital signs reviewed and stable  Post vital signs: Reviewed and stable  Complications: No apparent anesthesia complications

## 2011-02-23 NOTE — Progress Notes (Signed)
BLOOD SUGAR TESTED THIS AM 0950---245

## 2011-02-23 NOTE — Preoperative (Signed)
Beta Blockers   Reason not to administer Beta Blockers:Not Applicable 

## 2011-02-23 NOTE — Op Note (Signed)
NAME:  [name], [name] NO.:  [id number]  MEDICAL RECORD NO.:  [id number]  LOCATION:  [facility]                         FACILITY:  [facility]  PHYSICIAN:  Burnard Bunting, M.D.    DATE OF BIRTH:  March 14, 1966  DATE OF PROCEDURE:  02/23/2011 DATE OF DISCHARGE:  02/23/2011                              OPERATIVE REPORT   PREOPERATIVE DIAGNOSIS:  Right forth finger triggering.  POSTOPERATIVE DIAGNOSIS:  Right forth finger triggering.  PROCEDURE:  Right forth finger trigger finger release.  SURGEON:  Burnard Bunting, MD  ASSISTANT:  None.  ANESTHESIA:  IV regional.  INDICATIONS:  [name] [name] is a patient with right finger triggering #4 who presents for operative management after explanation of risks and benefits.  PROCEDURE IN DETAIL:  The patient was brought to the operating room where general endotracheal anesthesia was induced.  Preop IV antibiotics administered.  Time-out was called.  Right hand was prescrubbed with alcohol and Betadine scrub, which was allowed to air dry and prepped with DuraPrep solution and draped in sterile manner.  Incision was made along the distal palmar flexion crease at the base of the A1 pulley, ring finger.  Skin and subcutaneous tissues were sharply divided. Neurovascular bundles were mobilized on either side of the pulley. Under direct visualization, the A1 pulley was then released along its length.  The finger was taken through a range of motion and found to have no triggering.  Thorough irrigation was then performed.  The skin edges were anesthetized.  Bleeding points encountered which were controlled with bipolar electrocautery after the tourniquet was released.  The patient was placed in a bulky hand dressing.  He tolerated the procedure well without immediate complications.     Burnard Bunting, M.D.     GSD/MEDQ  D:  02/23/2011  T:  02/23/2011  Job:  [id number]

## 2011-02-23 NOTE — Anesthesia Postprocedure Evaluation (Signed)
  Anesthesia Post-op Note  Patient: [name] [name]  Procedure(s) Performed:  RELEASE TRIGGER FINGER/A-1 PULLEY - Right 4th trigger finger release  Patient Location: PACU  Anesthesia Type: Bier block  Level of Consciousness: awake, alert , oriented and patient cooperative  Airway and Oxygen Therapy: Patient Spontanous Breathing and Patient connected to nasal cannula oxygen  Post-op Pain: none  Post-op Assessment: Post-op Vital signs reviewed, Patient's Cardiovascular Status Stable, Respiratory Function Stable, Patent Airway and No signs of Nausea or vomiting  Post-op Vital Signs: stable  Complications: No apparent anesthesia complications

## 2011-02-23 NOTE — Brief Op Note (Signed)
02/23/2011  11:41 AM  PATIENT:  Rubie Maid  45 y.o. male  PRE-OPERATIVE DIAGNOSIS:  Right 4th trigger finger  POST-OPERATIVE DIAGNOSIS:  Right 4th trigger finger  PROCEDURE:  Procedure(s): RELEASE TRIGGER FINGER/A-1 PULLEY r4  SURGEON:  Surgeon(s): Cammy Copa  ASSISTANT:   ANESTHESIA:   regional  EBL: 2  ml       BLOOD ADMINISTERED: none  DRAINS: none   LOCAL MEDICATIONS USED:  none  SPECIMEN:  No Specimen  COUNTS:  YES  TOURNIQUET:   Total Tourniquet Time Documented: Forearm (Right) - 28 minutes  DICTATION: .Other Dictation: Dictation Number [id number]  PLAN OF CARE: Discharge to home after PACU  PATIENT DISPOSITION:  PACU - hemodynamically stable

## 2011-03-01 ENCOUNTER — Ambulatory Visit: Payer: Self-pay | Admitting: Pulmonary Disease

## 2011-03-01 ENCOUNTER — Encounter (HOSPITAL_COMMUNITY): Payer: Self-pay | Admitting: Orthopedic Surgery

## 2011-03-09 ENCOUNTER — Other Ambulatory Visit: Payer: Self-pay | Admitting: *Deleted

## 2011-03-09 MED ORDER — LOSARTAN POTASSIUM-HCTZ 100-25 MG PO TABS: 1.0000 | ORAL_TABLET | Freq: Every day | ORAL | Status: DC

## 2011-03-26 ENCOUNTER — Ambulatory Visit (INDEPENDENT_AMBULATORY_CARE_PROVIDER_SITE_OTHER): Payer: BC Managed Care – PPO

## 2011-03-26 DIAGNOSIS — J069 Acute upper respiratory infection, unspecified: Secondary | ICD-10-CM

## 2011-03-26 DIAGNOSIS — Z23 Encounter for immunization: Secondary | ICD-10-CM

## 2011-05-29 ENCOUNTER — Encounter (HOSPITAL_COMMUNITY): Payer: Self-pay

## 2011-05-29 ENCOUNTER — Emergency Department (INDEPENDENT_AMBULATORY_CARE_PROVIDER_SITE_OTHER)
Admission: EM | Admit: 2011-05-29 | Discharge: 2011-05-29 | Disposition: A | Discharge status: Home / Self Care | Payer: BC Managed Care – PPO | Source: Home / Self Care | Attending: Emergency Medicine | Admitting: Emergency Medicine

## 2011-05-29 DIAGNOSIS — J069 Acute upper respiratory infection, unspecified: Secondary | ICD-10-CM

## 2011-05-29 DIAGNOSIS — J45909 Unspecified asthma, uncomplicated: Secondary | ICD-10-CM

## 2011-05-29 MED ORDER — BENZONATATE 200 MG PO CAPS: 200.0000 mg | ORAL_CAPSULE | Freq: Three times a day (TID) | ORAL | Status: AC | PRN

## 2011-05-29 MED ORDER — AZITHROMYCIN 250 MG PO TABS: ORAL_TABLET | ORAL | Status: AC

## 2011-05-29 MED ORDER — BECLOMETHASONE DIPROPIONATE 80 MCG/ACT IN AERS: 2.0000 | INHALATION_SPRAY | Freq: Two times a day (BID) | RESPIRATORY_TRACT | Status: DC

## 2011-05-29 MED ORDER — ALBUTEROL SULFATE HFA 108 (90 BASE) MCG/ACT IN AERS: 1.0000 | INHALATION_SPRAY | Freq: Four times a day (QID) | RESPIRATORY_TRACT | Status: DC | PRN

## 2011-05-29 MED ORDER — PREDNISONE 5 MG PO KIT: 1.0000 | PACK | Freq: Every day | ORAL | Status: DC

## 2011-05-29 NOTE — Discharge Instructions (Signed)

## 2011-05-29 NOTE — ED Provider Notes (Signed)
Chief Complaint  Patient presents with  . Cough    History of Present Illness:   The patient has had a three-day history of nasal congestion with clear rhinorrhea, postnasal drip, cough productive of clear sputum, and sore throat. He denies any fever or chills. He has had some wheezing. He has a history of asthma and has Qvar 80 and albuterol at home, although he's run out of his albuterol. He tried the Qvar this morning but it didn't give him any immediate results.  Review of Systems:  Other than noted above, the patient denies any of the following symptoms. Systemic:  No fever, chills, sweats, fatigue, myalgias, headache, or anorexia. Eye:  No redness, pain or drainage. ENT:  No earache, nasal congestion, rhinorrhea, sinus pressure, or sore throat. Lungs:  No cough, sputum production, wheezing, shortness of breath. Or chest pain. GI:  No nausea, vomiting, abdominal pain or diarrhea. Skin:  No rash or itching.  PMFSH:  Past medical history, family history, social history, meds, and allergies were reviewed.  Physical Exam:   Vital signs:  BP 138/80  Pulse 99  Temp(Src) 98.6 F (37 C) (Oral)  Resp 19  SpO2 94% General:  Alert, in no distress. Eye:  No conjunctival injection or drainage. ENT:  TMs and canals were normal, without erythema or inflammation.  Nasal mucosa was clear and uncongested, without drainage.  Mucous membranes were moist.  Pharynx was clear, without exudate or drainage.  There were no oral ulcerations or lesions. Neck:  Supple, no adenopathy, tenderness or mass. Lungs:  No respiratory distress.  Lungs had bilateral expiratory wheezes anteriorly and posteriorly, without rales or rhonchi.  Breath sounds were equal bilaterally. Heart:  Regular rhythm, without gallops, murmers or rubs. Skin:  Clear, warm, and dry, without rash or lesions.   Radiology:  No results found.  Assessment:   Diagnoses that have been ruled out:  None  Diagnoses that are still under  consideration:  None  Final diagnoses:  Viral upper respiratory infection  Asthma      Plan:   1.  The following meds were prescribed:   New Prescriptions   ALBUTEROL (PROVENTIL HFA;VENTOLIN HFA) 108 (90 BASE) MCG/ACT INHALER    Inhale 1-2 puffs into the lungs every 6 (six) hours as needed for wheezing.   AZITHROMYCIN (ZITHROMAX Z-PAK) 250 MG TABLET    Take as directed.   BECLOMETHASONE (QVAR) 80 MCG/ACT INHALER    Inhale 2 puffs into the lungs 2 (two) times daily.   BENZONATATE (TESSALON) 200 MG CAPSULE    Take 1 capsule (200 mg total) by mouth 3 (three) times daily as needed for cough.   PREDNISONE 5 MG KIT    Take 1 kit (5 mg total) by mouth daily after breakfast. Prednisone 5 mg 6 day dosepack.  Take as directed.   2.  The patient was instructed in symptomatic care and handouts were given. 3.  The patient was told to return if becoming worse in any way, if no better in 3 or 4 days, and given some red flag symptoms that would indicate earlier return.  The patient was told not to get the prescription for antibiotic filled unless his  respiratory symptoms had persisted for more than 7 to 10 days.     Roque Lias, MD 05/29/11 1059

## 2011-05-29 NOTE — ED Notes (Signed)
Pt has cough and chest congestion for two days.

## 2011-05-30 ENCOUNTER — Other Ambulatory Visit: Payer: Self-pay

## 2011-06-01 ENCOUNTER — Other Ambulatory Visit: Payer: Self-pay | Admitting: Internal Medicine

## 2011-06-17 ENCOUNTER — Other Ambulatory Visit: Payer: Self-pay | Admitting: Family Medicine

## 2011-06-20 ENCOUNTER — Emergency Department (HOSPITAL_BASED_OUTPATIENT_CLINIC_OR_DEPARTMENT_OTHER)
Admission: EM | Admit: 2011-06-20 | Discharge: 2011-06-20 | Disposition: A | Discharge status: Home / Self Care | Payer: BC Managed Care – PPO | Attending: Emergency Medicine | Admitting: Emergency Medicine

## 2011-06-20 ENCOUNTER — Encounter (HOSPITAL_BASED_OUTPATIENT_CLINIC_OR_DEPARTMENT_OTHER): Payer: Self-pay | Admitting: Family Medicine

## 2011-06-20 DIAGNOSIS — Z794 Long term (current) use of insulin: Secondary | ICD-10-CM | POA: Insufficient documentation

## 2011-06-20 DIAGNOSIS — R059 Cough, unspecified: Secondary | ICD-10-CM | POA: Insufficient documentation

## 2011-06-20 DIAGNOSIS — K529 Noninfective gastroenteritis and colitis, unspecified: Secondary | ICD-10-CM

## 2011-06-20 DIAGNOSIS — E119 Type 2 diabetes mellitus without complications: Secondary | ICD-10-CM | POA: Insufficient documentation

## 2011-06-20 DIAGNOSIS — R509 Fever, unspecified: Secondary | ICD-10-CM | POA: Insufficient documentation

## 2011-06-20 DIAGNOSIS — I10 Essential (primary) hypertension: Secondary | ICD-10-CM | POA: Insufficient documentation

## 2011-06-20 DIAGNOSIS — R05 Cough: Secondary | ICD-10-CM | POA: Insufficient documentation

## 2011-06-20 DIAGNOSIS — K5289 Other specified noninfective gastroenteritis and colitis: Secondary | ICD-10-CM | POA: Insufficient documentation

## 2011-06-20 LAB — DIFFERENTIAL
Basophils Absolute: 0 10*3/uL (ref 0.0–0.1)
Eosinophils Absolute: 0.2 10*3/uL (ref 0.0–0.7)
Lymphocytes Relative: 18 % (ref 12–46)
Monocytes Relative: 11 % (ref 3–12)
Neutro Abs: 5.2 10*3/uL (ref 1.7–7.7)
Neutrophils Relative %: 69 % (ref 43–77)

## 2011-06-20 LAB — URINALYSIS, ROUTINE W REFLEX MICROSCOPIC
Hgb urine dipstick: NEGATIVE
Nitrite: NEGATIVE
Protein, ur: NEGATIVE mg/dL
Specific Gravity, Urine: 1.029 (ref 1.005–1.030)
Urobilinogen, UA: 0.2 mg/dL (ref 0.0–1.0)

## 2011-06-20 LAB — URINE MICROSCOPIC-ADD ON

## 2011-06-20 LAB — BASIC METABOLIC PANEL
Calcium: 8.8 mg/dL (ref 8.4–10.5)
GFR calc Af Amer: >90 mL/min (ref >90)
GFR calc non Af Amer: 87 mL/min — ABNORMAL LOW (ref >90)
Potassium: 3.6 mEq/L (ref 3.5–5.1)
Sodium: 136 mEq/L (ref 135–145)

## 2011-06-20 LAB — CBC
HCT: 48.2 % (ref 39.0–52.0)
MCHC: 35.9 g/dL (ref 30.0–36.0)
Platelets: 223 10*3/uL (ref 150–400)
RDW: 14.2 % (ref 11.5–15.5)
WBC: 7.6 10*3/uL (ref 4.0–10.5)

## 2011-06-20 LAB — GLUCOSE, CAPILLARY: Glucose-Capillary: 271 mg/dL — ABNORMAL HIGH (ref 70–99)

## 2011-06-20 MED ORDER — SODIUM CHLORIDE 0.9 % IV BOLUS (SEPSIS)
1000.0000 mL | Freq: Once | INTRAVENOUS | Status: AC
  Administered 2011-06-20: 1000 mL via INTRAVENOUS

## 2011-06-20 MED ORDER — ONDANSETRON HCL 4 MG/2ML IJ SOLN
INTRAMUSCULAR | Status: AC
  Filled 2011-06-20: qty 2

## 2011-06-20 MED ORDER — SODIUM CHLORIDE 0.9 % IV BOLUS (SEPSIS): 1000.0000 mL | Freq: Once | INTRAVENOUS | Status: DC

## 2011-06-20 MED ORDER — ONDANSETRON HCL 4 MG/2ML IJ SOLN
4.0000 mg | Freq: Once | INTRAMUSCULAR | Status: AC
  Administered 2011-06-20: 4 mg via INTRAVENOUS

## 2011-06-20 MED ORDER — ONDANSETRON HCL 4 MG PO TABS: 4.0000 mg | ORAL_TABLET | Freq: Four times a day (QID) | ORAL | Status: AC

## 2011-06-20 NOTE — ED Notes (Signed)
Patient's blood sugar 271.

## 2011-06-20 NOTE — ED Provider Notes (Signed)
History     CSN: [id number]  Arrival date & time 06/20/11  1478   First MD Initiated Contact with Patient 06/20/11 [phone]      Chief Complaint  Patient presents with  . Nausea  . Diarrhea  . Emesis    (Consider location/radiation/quality/duration/timing/severity/associated sxs/prior treatment) HPI Comments: Patient was treated approximally a week and half ago for bronchitis-like symptoms with Z-Pak and prednisone and inhalers. He felt like the symptoms are better. He just has a lingering cough. He started 2 days ago with sudden onset of vomiting which was followed by diarrhea. He continued having that for the next day. He was feeling a little bit better yesterday. Since he was feeling better,  he ate some [place] food. Shortly after that he started having the diarrhea again. He has had multiple episodes of diarrhea since then. He has little bit of nausea, but no ongoing vomiting. No fevers today. He did have fevers 2 days ago up to 100 denies any pain in his abdomen. Other than he had some cramping with the diarrhea, but denies any pain now. He describes the diarrhea as watery. No visible blood or black stools. He has not been using any over-the-counter medicines.  Patient is a 46 y.o. male presenting with diarrhea and vomiting. The history is provided by the patient.  Diarrhea The primary symptoms include fever, fatigue, nausea, vomiting and diarrhea. Primary symptoms do not include abdominal pain, arthralgias or rash. The illness began 2 days ago. The onset was sudden. The problem has been gradually worsening.  The illness does not include chills or back pain.  Emesis  Associated symptoms include cough, diarrhea and a fever. Pertinent negatives include no abdominal pain, no arthralgias, no chills and no headaches.    Past Medical History  Diagnosis Date  . NASH (nonalcoholic steatohepatitis)   . Numbness   . Cough   . Lumbar back pain   . Hypothyroidism   . Hyperlipidemia   .  Diabetes mellitus type II   . ASTHMA 10/28/2006  . OSA (obstructive sleep apnea) 11/19/2010  . Anxiety   . Hypertension     DR TODD PCP,PT HAS NOT SEEN >3 YRS    Past Surgical History  Procedure Date  . Right knee repair 1997  . Thyroidectomy     right  . Bilateral shoulder surgery   . Trigger finger release   . Trigger finger release 02/23/2011    Procedure: RELEASE TRIGGER FINGER/A-1 PULLEY;  Surgeon: Cammy Copa;  Location: [facility] OR;  Service: Orthopedics;  Laterality: Right;  Right 4th trigger finger release    Family History  Problem Relation Age of Onset  . Osteoarthritis Father   . Heart attack Father   . Breast cancer Mother   . Skin cancer Father     History  Substance Use Topics  . Smoking status: Never Smoker   . Smokeless tobacco: Not on file  . Alcohol Use: Yes     4 beers per week      Review of Systems  Constitutional: Positive for fever and fatigue. Negative for chills and diaphoresis.  HENT: Negative for congestion, rhinorrhea and sneezing.   Eyes: Negative.   Respiratory: Positive for cough. Negative for chest tightness and shortness of breath.   Cardiovascular: Negative for chest pain and leg swelling.  Gastrointestinal: Positive for nausea, vomiting and diarrhea. Negative for abdominal pain and blood in stool.  Genitourinary: Negative for frequency, hematuria, flank pain and difficulty urinating.  Musculoskeletal: Negative for  back pain and arthralgias.  Skin: Negative for rash.  Neurological: Negative for dizziness, speech difficulty, weakness, numbness and headaches.    Allergies  Review of patient's allergies indicates no known allergies.  Home Medications   Current Outpatient Rx  Name Route Sig Dispense Refill  . PREDNISONE 5 MG PO KIT Oral Take 1 kit (5 mg total) by mouth daily after breakfast. Prednisone 5 mg 6 day dosepack.  Take as directed. 1 kit 0  . ALBUTEROL SULFATE HFA 108 (90 BASE) MCG/ACT IN AERS Inhalation Inhale 2 puffs  into the lungs every 6 (six) hours as needed for wheezing. 1 Inhaler 3  . ALBUTEROL SULFATE HFA 108 (90 BASE) MCG/ACT IN AERS Inhalation Inhale 1-2 puffs into the lungs every 6 (six) hours as needed for wheezing. 1 Inhaler 0  . ASPIRIN 81 MG PO TABS Oral Take 81 mg by mouth daily.      . ATORVASTATIN CALCIUM 40 MG PO TABS Oral Take 40 mg by mouth daily.      . BECLOMETHASONE DIPROPIONATE 80 MCG/ACT IN AERS Inhalation Inhale 2 puffs into the lungs 2 (two) times daily. 1 Inhaler 0  . CELEXA 20 MG PO TABS  TAKE 1 1/2 TABLETS AT BEDTIME. 30 each 0    Office visit for more refills  . GLUCOSE BLOOD VI STRP  Use as instructed     . IBUPROFEN 800 MG PO TABS Oral Take 800 mg by mouth every 8 (eight) hours as needed.    . INSULIN GLARGINE 100 UNIT/ML [place] SOLN Subcutaneous Inject 60 Units into the skin 2 (two) times daily.     . INSULIN LISPRO (HUMAN) 100 UNIT/ML [place] SOLN Subcutaneous Inject 50 Units into the skin 3 (three) times daily with meals. 50 units not exceeding 200 units a day    . LEVOTHYROXINE SODIUM 137 MCG PO TABS Oral Take 137 mcg by mouth daily.      Marland Kitchen LOSARTAN POTASSIUM-HCTZ 100-25 MG PO TABS Oral Take 1 tablet by mouth daily. 90 tablet 1  . METFORMIN HCL 1000 MG PO TABS Oral Take 1,000 mg by mouth. 2 tablets at bedtime    . METFORMIN HCL 500 MG PO TABS Oral Take 1,000 mg by mouth at bedtime.      . OMEPRAZOLE 20 MG PO CPDR Oral Take 20 mg by mouth daily.      Marland Kitchen ONDANSETRON HCL 4 MG PO TABS Oral Take 1 tablet (4 mg total) by mouth every 6 (six) hours. 12 tablet 0  . PRAMLINTIDE ACETATE 600 MCG/ML [place] SOLN Subcutaneous Inject 60 mcg into the skin 3 (three) times daily with meals.        BP 143/96  Pulse 98  Temp(Src) 97.7 F (36.5 C) (Oral)  Resp 20  Ht 5\' 10"  (1.778 m)  Wt 251 lb (113.853 kg)  BMI 36.01 kg/m2  SpO2 100%  Physical Exam  Constitutional: He is oriented to person, place, and time. He appears well-developed and well-nourished.  HENT:  Head: Normocephalic and atraumatic.        Slightly dry mucous membranes  Eyes: Pupils are equal, round, and reactive to light.  Neck: Normal range of motion. Neck supple.  Cardiovascular: Normal rate, regular rhythm and normal heart sounds.   Pulmonary/Chest: Effort normal and breath sounds normal. No respiratory distress. He has no wheezes. He has no rales. He exhibits no tenderness.  Abdominal: Soft. Bowel sounds are normal. There is no tenderness. There is no rebound and no guarding.  Musculoskeletal: Normal  range of motion. He exhibits no edema.  Lymphadenopathy:    He has no cervical adenopathy.  Neurological: He is alert and oriented to person, place, and time.  Skin: Skin is warm and dry. No rash noted.  Psychiatric: He has a normal mood and affect.    ED Course  Procedures (including critical care time)  Results for orders placed during the hospital encounter of 06/20/11  GLUCOSE, CAPILLARY      Component Value Range   Glucose-Capillary 271 (*) 70 - 99 (mg/dL)  CBC      Component Value Range   WBC 7.6  4.0 - 10.5 (K/uL)   RBC 5.52  4.22 - 5.81 (MIL/uL)   Hemoglobin 17.3 (*) 13.0 - 17.0 (g/dL)   HCT 19.1  47.8 - 29.5 (%)   MCV 87.3  78.0 - 100.0 (fL)   MCH 31.3  26.0 - 34.0 (pg)   MCHC 35.9  30.0 - 36.0 (g/dL)   RDW 62.1  30.8 - 65.7 (%)   Platelets 223  150 - 400 (K/uL)  DIFFERENTIAL      Component Value Range   Neutrophils Relative 69  43 - 77 (%)   Lymphocytes Relative 18  12 - 46 (%)   Monocytes Relative 11  3 - 12 (%)   Eosinophils Relative 2  0 - 5 (%)   Basophils Relative 0  0 - 1 (%)   Neutro Abs 5.2  1.7 - 7.7 (K/uL)   Lymphs Abs 1.4  0.7 - 4.0 (K/uL)   Monocytes Absolute 0.8  0.1 - 1.0 (K/uL)   Eosinophils Absolute 0.2  0.0 - 0.7 (K/uL)   Basophils Absolute 0.0  0.0 - 0.1 (K/uL)   WBC Morphology WHITE COUNT CONFIRMED ON SMEAR     Smear Review LARGE PLATELETS PRESENT    BASIC METABOLIC PANEL      Component Value Range   Sodium 136  135 - 145 (mEq/L)   Potassium 3.6  3.5 - 5.1 (mEq/L)    Chloride 105  96 - 112 (mEq/L)   CO2 18 (*) 19 - 32 (mEq/L)   Glucose, Bld 300 (*) 70 - 99 (mg/dL)   BUN 33 (*) 6 - 23 (mg/dL)   Creatinine, Ser 8.46  0.50 - 1.35 (mg/dL)   Calcium 8.8  8.4 - 96.2 (mg/dL)   GFR calc non Af Amer 87 (*) >90 (mL/min)   GFR calc Af Amer >90  >90 (mL/min)  URINALYSIS, ROUTINE W REFLEX MICROSCOPIC      Component Value Range   Color, Urine YELLOW  YELLOW    APPearance CLEAR  CLEAR    Specific Gravity, Urine 1.029  1.005 - 1.030    pH 5.0  5.0 - 8.0    Glucose, UA >1000 (*) NEGATIVE (mg/dL)   Hgb urine dipstick NEGATIVE  NEGATIVE    Bilirubin Urine NEGATIVE  NEGATIVE    Ketones, ur NEGATIVE  NEGATIVE (mg/dL)   Protein, ur NEGATIVE  NEGATIVE (mg/dL)   Urobilinogen, UA 0.2  0.0 - 1.0 (mg/dL)   Nitrite NEGATIVE  NEGATIVE    Leukocytes, UA NEGATIVE  NEGATIVE   URINE MICROSCOPIC-ADD ON      Component Value Range   Squamous Epithelial / LPF RARE  RARE    WBC, UA 0-2  <3 (WBC/hpf)   Bacteria, UA RARE  RARE    Casts HYALINE CASTS (*) NEGATIVE    Urine-Other MUCOUS PRESENT     No results found.   No results found.   1.  Gastroenteritis       MDM  Patient feels much better after IV fluids. He feels, like he's ready to go home. He is tolerating by mouth fluids with no problem. Will follow his blood sugars at home, he does use insulin for diabetes control. We'll also give him prescription for Zofran and advised him on closely following a brat diet, meds, and followup with his doctor or return here as needed for any worsening symptoms or if no better within the next couple of days        Rolan Bucco, MD 06/20/11 1130

## 2011-06-20 NOTE — ED Notes (Signed)
Pt c/o n/v/d since Friday and worse this morning. Pt also sts cbg at home has been dropping in the 50s since illness started. Pt sts he just finished prednisone and zpak for URI prior to symptoms.

## 2011-06-20 NOTE — Discharge Instructions (Signed)
B.R.A.T. Diet Your doctor has recommended the B.R.A.T. diet for you or your child until the condition improves. This is often used to help control diarrhea and vomiting symptoms. If you or your child can tolerate clear liquids, you may have:  Bananas.   Rice.   Applesauce.   Toast (and other simple starches such as crackers, potatoes, noodles).  Be sure to avoid dairy products, meats, and fatty foods until symptoms are better. Fruit juices such as apple, grape, and prune juice can make diarrhea worse. Avoid these. Continue this diet for 2 days or as instructed by your caregiver. Document Released: 03/21/2005 Document Revised: 03/10/2011 Document Reviewed: 09/07/2006 ExitCare Patient Information 2012 [employer].Viral Gastroenteritis Viral gastroenteritis is also known as stomach flu. This condition affects the stomach and intestinal tract. It can cause sudden diarrhea and vomiting. The illness typically lasts 3 to 8 days. Most people develop an immune response that eventually gets rid of the virus. While this natural response develops, the virus can make you quite ill. CAUSES  Many different viruses can cause gastroenteritis, such as rotavirus or noroviruses. You can catch one of these viruses by consuming contaminated food or water. You may also catch a virus by sharing utensils or other personal items with an infected person or by touching a contaminated surface. SYMPTOMS  The most common symptoms are diarrhea and vomiting. These problems can cause a severe loss of body fluids (dehydration) and a body salt (electrolyte) imbalance. Other symptoms may include:  Fever.   Headache.   Fatigue.   Abdominal pain.  DIAGNOSIS  Your caregiver can usually diagnose viral gastroenteritis based on your symptoms and a physical exam. A stool sample may also be taken to test for the presence of viruses or other infections. TREATMENT  This illness typically goes away on its own. Treatments are aimed  at rehydration. The most serious cases of viral gastroenteritis involve vomiting so severely that you are not able to keep fluids down. In these cases, fluids must be given through an intravenous line (IV). HOME CARE INSTRUCTIONS   Drink enough fluids to keep your urine clear or pale yellow. Drink small amounts of fluids frequently and increase the amounts as tolerated.   Ask your caregiver for specific rehydration instructions.   Avoid:   Foods high in sugar.   Alcohol.   Carbonated drinks.   Tobacco.   Juice.   Caffeine drinks.   Extremely hot or cold fluids.   Fatty, greasy foods.   Too much intake of anything at one time.   Dairy products until 24 to 48 hours after diarrhea stops.   You may consume probiotics. Probiotics are active cultures of beneficial bacteria. They may lessen the amount and number of diarrheal stools in adults. Probiotics can be found in yogurt with active cultures and in supplements.   Wash your hands well to avoid spreading the virus.   Only take over-the-counter or prescription medicines for pain, discomfort, or fever as directed by your caregiver. Do not give aspirin to children. Antidiarrheal medicines are not recommended.   Ask your caregiver if you should continue to take your regular prescribed and over-the-counter medicines.   Keep all follow-up appointments as directed by your caregiver.  SEEK IMMEDIATE MEDICAL CARE IF:   You are unable to keep fluids down.   You do not urinate at least once every 6 to 8 hours.   You develop shortness of breath.   You notice blood in your stool or vomit. This may   look like coffee grounds.   You have abdominal pain that increases or is concentrated in one small area (localized).   You have persistent vomiting or diarrhea.   You have a fever.   The patient is a child younger than 3 months, and he or she has a fever.   The patient is a child older than 3 months, and he or she has a fever and  persistent symptoms.   The patient is a child older than 3 months, and he or she has a fever and symptoms suddenly get worse.   The patient is a baby, and he or she has no tears when crying.  MAKE SURE YOU:   Understand these instructions.   Will watch your condition.   Will get help right away if you are not doing well or get worse.  Document Released: 03/21/2005 Document Revised: 03/10/2011 Document Reviewed: 01/05/2011 Exit[employer] Patient Information 2012 [employer]. 

## 2011-07-14 ENCOUNTER — Other Ambulatory Visit: Payer: Self-pay | Admitting: Family Medicine

## 2011-08-27 ENCOUNTER — Other Ambulatory Visit: Payer: Self-pay | Admitting: Family Medicine

## 2011-09-08 ENCOUNTER — Ambulatory Visit (INDEPENDENT_AMBULATORY_CARE_PROVIDER_SITE_OTHER): Payer: BC Managed Care – PPO | Admitting: Family Medicine

## 2011-09-08 ENCOUNTER — Encounter: Payer: Self-pay | Admitting: Family Medicine

## 2011-09-08 VITALS — BP 140/90 | Temp 97.8°F | Wt 266.0 lb

## 2011-09-08 DIAGNOSIS — F329 Major depressive disorder, single episode, unspecified: Secondary | ICD-10-CM | POA: Insufficient documentation

## 2011-09-08 DIAGNOSIS — G5601 Carpal tunnel syndrome, right upper limb: Secondary | ICD-10-CM | POA: Insufficient documentation

## 2011-09-08 DIAGNOSIS — G5600 Carpal tunnel syndrome, unspecified upper limb: Secondary | ICD-10-CM

## 2011-09-08 MED ORDER — CITALOPRAM HYDROBROMIDE 20 MG PO TABS: 20.0000 mg | ORAL_TABLET | Freq: Every day | ORAL | Status: DC

## 2011-09-08 NOTE — Patient Instructions (Signed)
Purchase the short arm splint wear it 24 7  Motrin 600 mg twice daily with food  Elevation and ice for 30 minutes prior to bedtime  If your symptoms do not abate or they get worse I would recommend a consult from the hand specialist Dr. Cleone Slim Cypher

## 2011-09-08 NOTE — Progress Notes (Signed)
  Subjective:    Patient ID: [name] [name], male    DOB: 03/21/1966, 46 y.o.   MRN: [id number]  HPI [name] is a 46 year old male who comes in today for refill of his Celexa and for evaluation of pain in his right wrist  He states about a year and a half ago he fell at home her his wrist and elbow never went to see anybody at resolve spontaneously. About 4 weeks ago he began having pain and tingling sensation in his right wrist. Strength and sensation are normal. No other history of trauma. He is right-handed he works with his right hand a lot at work. Left wrist normal  He would also like a refill on his Celexa 20 mg  His other medications are given to him by his endocrinologist who sees been seen for his diabetes and general medical care   Review of Systems    neuromuscular review of systems negative Objective:   Physical Exam  Well-developed well nourished and in no acute distress examination the hand shows the hand appears normal circulation normal strength sensation normal tenderness to palpation with the right wrist extended      Assessment & Plan:  Carpal tunnel syndrome right wrist plan splint Motrin return when necessary  History of anxiety/depression refill Celexa

## 2011-09-29 ENCOUNTER — Other Ambulatory Visit: Payer: Self-pay | Admitting: *Deleted

## 2011-09-29 DIAGNOSIS — F329 Major depressive disorder, single episode, unspecified: Secondary | ICD-10-CM

## 2011-09-29 MED ORDER — CITALOPRAM HYDROBROMIDE 20 MG PO TABS: 20.0000 mg | ORAL_TABLET | Freq: Every day | ORAL | Status: DC

## 2011-10-03 ENCOUNTER — Other Ambulatory Visit: Payer: Self-pay | Admitting: *Deleted

## 2011-10-03 DIAGNOSIS — F329 Major depressive disorder, single episode, unspecified: Secondary | ICD-10-CM

## 2011-10-03 MED ORDER — CITALOPRAM HYDROBROMIDE 20 MG PO TABS: ORAL_TABLET | ORAL | Status: DC

## 2012-01-22 ENCOUNTER — Other Ambulatory Visit: Payer: Self-pay | Admitting: Family Medicine

## 2012-01-23 ENCOUNTER — Other Ambulatory Visit: Payer: Self-pay | Admitting: Family Medicine

## 2012-08-14 ENCOUNTER — Other Ambulatory Visit: Payer: Self-pay | Admitting: Orthopedic Surgery

## 2012-08-14 ENCOUNTER — Other Ambulatory Visit: Payer: Self-pay | Admitting: Family Medicine

## 2012-08-14 ENCOUNTER — Encounter: Payer: Self-pay | Admitting: Family Medicine

## 2012-08-14 ENCOUNTER — Ambulatory Visit (INDEPENDENT_AMBULATORY_CARE_PROVIDER_SITE_OTHER): Payer: BC Managed Care – PPO | Admitting: Family Medicine

## 2012-08-14 DIAGNOSIS — R609 Edema, unspecified: Secondary | ICD-10-CM

## 2012-08-14 DIAGNOSIS — E108 Type 1 diabetes mellitus with unspecified complications: Secondary | ICD-10-CM

## 2012-08-14 DIAGNOSIS — M25561 Pain in right knee: Secondary | ICD-10-CM

## 2012-08-14 DIAGNOSIS — R531 Weakness: Secondary | ICD-10-CM

## 2012-08-14 DIAGNOSIS — E1065 Type 1 diabetes mellitus with hyperglycemia: Secondary | ICD-10-CM

## 2012-08-14 DIAGNOSIS — E119 Type 2 diabetes mellitus without complications: Secondary | ICD-10-CM | POA: Insufficient documentation

## 2012-08-14 MED ORDER — AMITRIPTYLINE HCL 50 MG PO TABS: 50.0000 mg | ORAL_TABLET | Freq: Every day | ORAL | Status: DC

## 2012-08-14 NOTE — Patient Instructions (Signed)
Strict diet  Drink 24 ounces of water daily  Elavil 50 mg,,,,,,,, one tablet daily at bedtime  We will get you set up with a urgent consult with Dr. Lenon Ahmadi,,,,,,, her new endocrinologist
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
## 2012-08-21 ENCOUNTER — Ambulatory Visit: Payer: BC Managed Care – PPO | Admitting: Internal Medicine

## 2012-08-21 ENCOUNTER — Ambulatory Visit
Admission: RE | Admit: 2012-08-21 | Discharge: 2012-08-21 | Disposition: A | Discharge status: Home / Self Care | Payer: BC Managed Care – PPO | Source: Ambulatory Visit | Attending: Orthopedic Surgery | Admitting: Orthopedic Surgery

## 2012-08-21 DIAGNOSIS — R531 Weakness: Secondary | ICD-10-CM

## 2012-08-21 DIAGNOSIS — M25561 Pain in right knee: Secondary | ICD-10-CM

## 2012-08-21 DIAGNOSIS — R609 Edema, unspecified: Secondary | ICD-10-CM

## 2012-11-04 ENCOUNTER — Other Ambulatory Visit: Payer: Self-pay | Admitting: Family Medicine

## 2013-01-08 ENCOUNTER — Other Ambulatory Visit: Payer: Self-pay | Admitting: Family Medicine

## 2013-02-05 ENCOUNTER — Other Ambulatory Visit: Payer: Self-pay | Admitting: Family Medicine

## 2013-05-15 ENCOUNTER — Telehealth: Payer: Self-pay | Admitting: Family Medicine

## 2013-05-15 MED ORDER — OMEPRAZOLE 20 MG PO CPDR: DELAYED_RELEASE_CAPSULE | ORAL | Status: DC

## 2013-05-15 NOTE — Telephone Encounter (Signed)
[place] requesting refill of omeprazole (PRILOSEC) 20 MG capsule

## 2013-05-15 NOTE — Telephone Encounter (Signed)
rx sent

## 2013-06-18 ENCOUNTER — Other Ambulatory Visit: Payer: Self-pay | Admitting: Family Medicine

## 2013-10-07 ENCOUNTER — Other Ambulatory Visit: Payer: Self-pay | Admitting: Family Medicine

## 2013-10-10 ENCOUNTER — Ambulatory Visit (INDEPENDENT_AMBULATORY_CARE_PROVIDER_SITE_OTHER): Payer: BC Managed Care – PPO | Admitting: Emergency Medicine

## 2013-10-10 ENCOUNTER — Encounter: Payer: Self-pay | Admitting: Emergency Medicine

## 2013-10-10 VITALS — BP 138/80 | HR 82 | Temp 98.4°F | Resp 18 | Ht 71.0 in | Wt 252.0 lb

## 2013-10-10 DIAGNOSIS — R5381 Other malaise: Secondary | ICD-10-CM

## 2013-10-10 DIAGNOSIS — E1065 Type 1 diabetes mellitus with hyperglycemia: Secondary | ICD-10-CM

## 2013-10-10 DIAGNOSIS — IMO0002 Reserved for concepts with insufficient information to code with codable children: Secondary | ICD-10-CM

## 2013-10-10 DIAGNOSIS — R41840 Attention and concentration deficit: Secondary | ICD-10-CM

## 2013-10-10 DIAGNOSIS — R5383 Other fatigue: Principal | ICD-10-CM

## 2013-10-10 DIAGNOSIS — E108 Type 1 diabetes mellitus with unspecified complications: Secondary | ICD-10-CM

## 2013-10-10 MED ORDER — CITALOPRAM HYDROBROMIDE 40 MG PO TABS: 40.0000 mg | ORAL_TABLET | Freq: Every day | ORAL | Status: DC

## 2013-10-10 MED ORDER — AMITRIPTYLINE HCL 50 MG PO TABS: 50.0000 mg | ORAL_TABLET | Freq: Every day | ORAL | Status: DC

## 2013-10-10 NOTE — Progress Notes (Signed)
Subjective:    Patient ID: [name] [name], male    DOB: [birth year], 48 y.o.   MRN: [id number]  HPI Comments: 48 yo WM with increased stress with work. He notes difficulty with getting focused. He is not sleeping good, he is not using cpap machine because he could tolerate mask. He is chronically fatigued. He gets labs checked at Dr [name] [name] in [place] for cholesterol and diabetes. He notes BS is better with Invokana.   WBC             7.6   06/20/2011 HGB            17.3   06/20/2011 HCT            48.2   06/20/2011 PLT             223   06/20/2011 GLUCOSE         300   06/20/2011 CHOL            176   11/07/2006 TRIG            228   11/07/2006 HDL            31.1   11/07/2006 LDLDIRECT     120.3   11/07/2006 ALT              92   05/12/2009 AST              60   05/12/2009 NA              136   06/20/2011 K               3.6   06/20/2011 CL              105   06/20/2011 CREATININE     1.00   06/20/2011 BUN              33   06/20/2011 CO2              18   06/20/2011 TSH            1.42   05/12/2009 HGBA1C          9.5   05/12/2009 MICROALBUR     15.7   05/12/2009      Medication List       This list is accurate as of: 10/10/13  3:34 PM.  Always use your most recent med list.               ACCU-CHEK COMPACT STRIPS test strip  Generic drug:  glucose blood  Use as instructed     amitriptyline 50 MG tablet  Commonly known as:  ELAVIL  Take 1 tablet (50 mg total) by mouth at bedtime.     aspirin 81 MG tablet  Take 81 mg by mouth daily.     atorvastatin 40 MG tablet  Commonly known as:  LIPITOR  Take 40 mg by mouth daily.     citalopram 20 MG tablet  Commonly known as:  CELEXA  Take 30 mg by mouth daily. Patient take 1 and 1/2 of the 20 mg pills qd = 30 mg daily     HUMALOG KWIKPEN 100 UNIT/ML injection  Generic drug:  insulin lispro  Inject 50 Units into the skin 3 (three) times daily with meals. 50 units not exceeding 200 units a day     ibuprofen 800 MG tablet  Commonly known  as:  ADVIL,MOTRIN  Take 800 mg by mouth every 8 (eight) hours as needed.     insulin glargine 100 UNIT/ML injection  Commonly known as:  LANTUS  Inject 60 Units into the skin 2 (two) times daily.     INVOKANA 300 MG Tabs  Generic drug:  Canagliflozin  Take 300 mg by mouth daily.     levothyroxine 137 MCG tablet  Commonly known as:  SYNTHROID, LEVOTHROID  Take 137 mcg by mouth daily.     losartan-hydrochlorothiazide 100-25 MG per tablet  Commonly known as:  HYZAAR  Take 1 tablet by mouth daily.     metFORMIN 500 MG tablet  Commonly known as:  GLUCOPHAGE  Take 500 mg by mouth. 2 tabs daily     omeprazole 20 MG capsule  Commonly known as:  PRILOSEC  TAKE (1) CAPSULE DAILY.       No Known Allergies  Past Medical History  Diagnosis Date  . NASH (nonalcoholic steatohepatitis)   . Numbness   . Cough   . Lumbar back pain   . Hypothyroidism   . Hyperlipidemia   . Diabetes mellitus type II   . ASTHMA 10/28/2006  . OSA (obstructive sleep apnea) 11/19/2010  . Anxiety   . Hypertension     DR TODD PCP,PT HAS NOT SEEN >3 YRS     Review of Systems  Psychiatric/Behavioral: Positive for sleep disturbance. The patient is nervous/anxious.   All other systems reviewed and are negative.  BP 138/80  Pulse 82  Temp(Src) 98.4 F (36.9 C) (Temporal)  Resp 18  Ht 5\' 11"  (1.803 m)  Wt 252 lb (114.306 kg)  BMI 35.16 kg/m2     Objective:   Physical Exam  Nursing note and vitals reviewed. Constitutional: He is oriented to person, place, and time. He appears well-developed and well-nourished.  obese  HENT:  Head: Normocephalic and atraumatic.  Right Ear: External ear normal.  Left Ear: External ear normal.  Nose: Nose normal.  Mouth/Throat: Oropharynx is clear and moist. No oropharyngeal exudate.  Large tongue  Eyes: Conjunctivae are normal.  Neck: Normal range of motion.  Cardiovascular: Normal rate, regular rhythm, normal heart sounds and intact distal pulses.    Pulmonary/Chest: Effort normal and breath sounds normal.  Abdominal: Soft. Bowel sounds are normal.  Musculoskeletal: Normal range of motion.  Lymphadenopathy:    He has no cervical adenopathy.  Neurological: He is alert and oriented to person, place, and time. No cranial nerve deficit. Coordination normal.  Skin: Skin is warm and dry.  Psychiatric: He has a normal mood and affect. His behavior is normal. Judgment and thought content normal.          Assessment & Plan:  1. Anxiety/ Sleep disturbance- check labsCitalopram increase to 40 mg QD and restart Elavil at night Restart CPAP if cannot tolerate mask will call for dental referral.  2. ? ADD-Consider ADD RX, if no change.

## 2013-10-10 NOTE — Patient Instructions (Signed)
Sleep Apnea Sleep apnea is disorder that affects a person's sleep. A person with sleep apnea has abnormal pauses in their breathing when they sleep. It is hard for them to get a good sleep. This makes a person tired during the day. It also can lead to other physical problems. There are three types of sleep apnea. One type is when breathing stops for a short time because your airway is blocked (obstructive sleep apnea). Another type is when the brain sometimes fails to give the normal signal to breathe to the muscles that control your breathing (central sleep apnea). The third type is a combination of the other two types. HOME CARE  Do not sleep on your back. Try to sleep on your side.  Take all medicine as told by your doctor.  Avoid alcohol, calming medicines (sedatives), and depressant drugs.  Try to lose weight if you are overweight. Talk to your doctor about a healthy weight goal. Your doctor may have you use a device that helps to open your airway. It can help you get the air that you need. It is called a positive airway pressure (PAP) device. There are three types of PAP devices:  Continuous positive airway pressure (CPAP) device.  Nasal expiratory positive airway pressure (EPAP) device.  Bilevel positive airway pressure (BPAP) device. MAKE SURE YOU:  Understand these instructions.  Will watch your condition.  Will get help right away if you are not doing well or get worse. Document Released: 12/29/2007 Document Revised: 03/07/2012 Document Reviewed: 07/23/2011 [facility] Patient Information 2015 [place], [place]. This information is not intended to replace advice given to you by your health care provider. Make sure you discuss any questions you have with your health care provider.  Generalized Anxiety Disorder Generalized anxiety disorder (GAD) is a mental disorder. It interferes with life functions, including relationships, work, and school. GAD is different from normal anxiety,  which everyone experiences at some point in their lives in response to specific life events and activities. Normal anxiety actually helps [place] prepare for and get through these life events and activities. Normal anxiety goes away after the event or activity is over.  GAD causes anxiety that is not necessarily related to specific events or activities. It also causes excess anxiety in proportion to specific events or activities. The anxiety associated with GAD is also difficult to control. GAD can vary from mild to severe. People with severe GAD can have intense waves of anxiety with physical symptoms (panic attacks).  SYMPTOMS The anxiety and worry associated with GAD are difficult to control. This anxiety and worry are related to many life events and activities and also occur more days than not for 6 months or longer. People with GAD also have three or more of the following symptoms (one or more in children):  Restlessness.   Fatigue.  Difficulty concentrating.   Irritability.  Muscle tension.  Difficulty sleeping or unsatisfying sleep. DIAGNOSIS GAD is diagnosed through an assessment by your caregiver. Your caregiver will ask you questions aboutyour mood,physical symptoms, and events in your life. Your caregiver may ask you about your medical history and use of alcohol or drugs, including prescription medications. Your caregiver may also do a physical exam and blood tests. Certain medical conditions and the use of certain substances can cause symptoms similar to those associated with GAD. Your caregiver may refer you to a mental health specialist for further evaluation. TREATMENT The following therapies are usually used to treat GAD:   Medication--Antidepressant medication usually is  prescribed for long-term daily control. Antianxiety medications may be added in severe cases, especially when panic attacks occur.   Talk therapy (psychotherapy)--Certain types of talk therapy can be helpful  in treating GAD by providing support, education, and guidance. A form of talk therapy called cognitive behavioral therapy can teach you healthy ways to think about and react to daily life events and activities.  Stress managementtechniques--These include yoga, meditation, and exercise and can be very helpful when they are practiced regularly. A mental health specialist can help determine which treatment is best for you. Some people see improvement with one therapy. However, other people require a combination of therapies. Document Released: 07/16/2012 Document Reviewed: 07/16/2012 [facility] Patient Information 2015 [place]. This information is not intended to replace advice given to you by your health care provider. Make sure you discuss any questions you have with your health care provider.

## 2013-10-11 LAB — CBC WITH DIFFERENTIAL/PLATELET
Basophils Absolute: 0.1 10*3/uL (ref 0.0–0.1)
Basophils Relative: 1 % (ref 0–1)
Eosinophils Absolute: 0.4 10*3/uL (ref 0.0–0.7)
Eosinophils Relative: 5 % (ref 0–5)
HCT: 42.2 % (ref 39.0–52.0)
HEMOGLOBIN: 15.2 g/dL (ref 13.0–17.0)
LYMPHS ABS: 2.6 10*3/uL (ref 0.7–4.0)
LYMPHS PCT: 30 % (ref 12–46)
MCH: 31.4 pg (ref 26.0–34.0)
MCHC: 36 g/dL (ref 30.0–36.0)
MCV: 87.2 fL (ref 78.0–100.0)
Monocytes Absolute: 0.6 10*3/uL (ref 0.1–1.0)
Monocytes Relative: 7 % (ref 3–12)
NEUTROS ABS: 4.8 10*3/uL (ref 1.7–7.7)
NEUTROS PCT: 57 % (ref 43–77)
Platelets: 238 10*3/uL (ref 150–400)
RBC: 4.84 MIL/uL (ref 4.22–5.81)
RDW: 13.4 % (ref 11.5–15.5)
WBC: 8.5 10*3/uL (ref 4.0–10.5)

## 2013-10-11 LAB — BASIC METABOLIC PANEL WITH GFR
BUN: 21 mg/dL (ref 6–23)
CALCIUM: 9.4 mg/dL (ref 8.4–10.5)
CO2: 25 meq/L (ref 19–32)
CREATININE: 0.88 mg/dL (ref 0.50–1.35)
Chloride: 101 mEq/L (ref 96–112)
GFR, Est African American: >89 mL/min
GFR, Est Non African American: >89 mL/min
GLUCOSE: 179 mg/dL — AB (ref 70–99)
Potassium: 4 mEq/L (ref 3.5–5.3)
Sodium: 137 mEq/L (ref 135–145)

## 2013-10-11 LAB — HEPATIC FUNCTION PANEL
ALT: 50 U/L (ref 0–53)
AST: 33 U/L (ref 0–37)
Albumin: 4.7 g/dL (ref 3.5–5.2)
Alkaline Phosphatase: 72 U/L (ref 39–117)
BILIRUBIN DIRECT: 0.1 mg/dL (ref 0.0–0.3)
BILIRUBIN INDIRECT: 0.6 mg/dL (ref 0.2–1.2)
Total Bilirubin: 0.7 mg/dL (ref 0.2–1.2)
Total Protein: 7.4 g/dL (ref 6.0–8.3)

## 2013-10-11 LAB — TSH: TSH: 2.004 u[IU]/mL (ref 0.350–4.500)

## 2014-01-16 ENCOUNTER — Encounter: Payer: Self-pay | Admitting: Physician Assistant

## 2014-01-16 ENCOUNTER — Ambulatory Visit (INDEPENDENT_AMBULATORY_CARE_PROVIDER_SITE_OTHER): Payer: BC Managed Care – PPO | Admitting: Physician Assistant

## 2014-01-16 ENCOUNTER — Telehealth: Payer: Self-pay | Admitting: Internal Medicine

## 2014-01-16 VITALS — BP 110/78 | HR 76 | Temp 97.9°F | Resp 16 | Ht 71.0 in | Wt 247.0 lb

## 2014-01-16 DIAGNOSIS — Z23 Encounter for immunization: Secondary | ICD-10-CM

## 2014-01-16 DIAGNOSIS — M5441 Lumbago with sciatica, right side: Secondary | ICD-10-CM

## 2014-01-16 MED ORDER — HYDROCODONE-ACETAMINOPHEN 5-325 MG PO TABS: 1.0000 | ORAL_TABLET | Freq: Four times a day (QID) | ORAL | Status: DC | PRN

## 2014-01-16 MED ORDER — MELOXICAM 15 MG PO TABS: ORAL_TABLET | ORAL | Status: DC

## 2014-01-16 NOTE — Patient Instructions (Signed)
Sciatica with Rehab The sciatic nerve runs from the back down the leg and is responsible for sensation and control of the muscles in the back (posterior) side of the thigh, lower leg, and foot. Sciatica is a condition that is characterized by inflammation of this nerve.  SYMPTOMS   Signs of nerve damage, including numbness and/or weakness along the posterior side of the lower extremity.  Pain in the back of the thigh that may also travel down the leg.  Pain that worsens when sitting for long periods of time.  Occasionally, pain in the back or buttock. CAUSES  Inflammation of the sciatic nerve is the cause of sciatica. The inflammation is due to something irritating the nerve. Common sources of irritation include:  Sitting for long periods of time.  Direct trauma to the nerve.  Arthritis of the spine.  Herniated or ruptured disk.  Slipping of the vertebrae (spondylolisthesis).  Pressure from soft tissues, such as muscles or ligament-like tissue (fascia). RISK INCREASES WITH:  Sports that place pressure or stress on the spine (football or weightlifting).  Poor strength and flexibility.  Failure to warm up properly before activity.  Family history of low back pain or disk disorders.  Previous back injury or surgery.  Poor body mechanics, especially when lifting, or poor posture. PREVENTION   Warm up and stretch properly before activity.  Maintain physical fitness:  Strength, flexibility, and endurance.  Cardiovascular fitness.  Learn and use proper technique, especially with posture and lifting. When possible, have coach correct improper technique.  Avoid activities that place stress on the spine. PROGNOSIS If treated properly, then sciatica usually resolves within 6 weeks. However, occasionally surgery is necessary.  RELATED COMPLICATIONS   Permanent nerve damage, including pain, numbness, tingle, or weakness.  Chronic back pain.  Risks of surgery: infection,  bleeding, nerve damage, or damage to surrounding tissues. TREATMENT Treatment initially involves resting from any activities that aggravate your symptoms. The use of ice and medication may help reduce pain and inflammation. The use of strengthening and stretching exercises may help reduce pain with activity. These exercises may be performed at home or with referral to a therapist. A therapist may recommend further treatments, such as transcutaneous electronic nerve stimulation (TENS) or ultrasound. Your caregiver may recommend corticosteroid injections to help reduce inflammation of the sciatic nerve. If symptoms persist despite non-surgical (conservative) treatment, then surgery may be recommended. MEDICATION  If pain medication is necessary, then nonsteroidal anti-inflammatory medications, such as aspirin and ibuprofen, or other minor pain relievers, such as acetaminophen, are often recommended.  Do not take pain medication for 7 days before surgery.  Prescription pain relievers may be given if deemed necessary by your caregiver. Use only as directed and only as much as you need.  Ointments applied to the skin may be helpful.  Corticosteroid injections may be given by your caregiver. These injections should be reserved for the most serious cases, because they may only be given a certain number of times. HEAT AND COLD  Cold treatment (icing) relieves pain and reduces inflammation. Cold treatment should be applied for 10 to 15 minutes every 2 to 3 hours for inflammation and pain and immediately after any activity that aggravates your symptoms. Use ice packs or massage the area with a piece of ice (ice massage).  Heat treatment may be used prior to performing the stretching and strengthening activities prescribed by your caregiver, physical therapist, or athletic trainer. Use a heat pack or soak the injury in warm water.   SEEK MEDICAL CARE IF:  Treatment seems to offer no benefit, or the condition  worsens.  Any medications produce adverse side effects. EXERCISES  RANGE OF MOTION (ROM) AND STRETCHING EXERCISES - Sciatica Most people with sciatic will find that their symptoms worsen with either excessive bending forward (flexion) or arching at the low back (extension). The exercises which will help resolve your symptoms will focus on the opposite motion. Your physician, physical therapist or athletic trainer will help you determine which exercises will be most helpful to resolve your low back pain. Do not complete any exercises without first consulting with your clinician. Discontinue any exercises which worsen your symptoms until you speak to your clinician. If you have pain, numbness or tingling which travels down into your buttocks, leg or foot, the goal of the therapy is for these symptoms to move closer to your back and eventually resolve. Occasionally, these leg symptoms will get better, but your low back pain may worsen; this is typically an indication of progress in your rehabilitation. Be certain to be very alert to any changes in your symptoms and the activities in which you participated in the 24 hours prior to the change. Sharing this information with your clinician will allow him/her to most efficiently treat your condition. These exercises may help you when beginning to rehabilitate your injury. Your symptoms may resolve with or without further involvement from your physician, physical therapist or athletic trainer. While completing these exercises, remember:   Restoring tissue flexibility helps normal motion to return to the joints. This allows healthier, less painful movement and activity.  An effective stretch should be held for at least 30 seconds.  A stretch should never be painful. You should only feel a gentle lengthening or release in the stretched tissue. FLEXION RANGE OF MOTION AND STRETCHING EXERCISES: STRETCH - Flexion, Single Knee to Chest   Lie on a firm bed or floor  with both legs extended in front of you.  Keeping one leg in contact with the floor, bring your opposite knee to your chest. Hold your leg in place by either grabbing behind your thigh or at your knee.  Pull until you feel a gentle stretch in your low back. Hold __________ seconds.  Slowly release your grasp and repeat the exercise with the opposite side. Repeat __________ times. Complete this exercise __________ times per day.  STRETCH - Flexion, Double Knee to Chest  Lie on a firm bed or floor with both legs extended in front of you.  Keeping one leg in contact with the floor, bring your opposite knee to your chest.  Tense your stomach muscles to support your back and then lift your other knee to your chest. Hold your legs in place by either grabbing behind your thighs or at your knees.  Pull both knees toward your chest until you feel a gentle stretch in your low back. Hold __________ seconds.  Tense your stomach muscles and slowly return one leg at a time to the floor. Repeat __________ times. Complete this exercise __________ times per day.  STRETCH - Low Trunk Rotation   Lie on a firm bed or floor. Keeping your legs in front of you, bend your knees so they are both pointed toward the ceiling and your feet are flat on the floor.  Extend your arms out to the side. This will stabilize your upper body by keeping your shoulders in contact with the floor.  Gently and slowly drop both knees together to one side until   you feel a gentle stretch in your low back. Hold for __________ seconds.  Tense your stomach muscles to support your low back as you bring your knees back to the starting position. Repeat the exercise to the other side. Repeat __________ times. Complete this exercise __________ times per day  EXTENSION RANGE OF MOTION AND FLEXIBILITY EXERCISES: STRETCH - Extension, Prone on Elbows  Lie on your stomach on the floor, a bed will be too soft. Place your palms about shoulder  width apart and at the height of your head.  Place your elbows under your shoulders. If this is too painful, stack pillows under your chest.  Allow your body to relax so that your hips drop lower and make contact more completely with the floor.  Hold this position for __________ seconds.  Slowly return to lying flat on the floor. Repeat __________ times. Complete this exercise __________ times per day.  RANGE OF MOTION - Extension, Prone Press Ups  Lie on your stomach on the floor, a bed will be too soft. Place your palms about shoulder width apart and at the height of your head.  Keeping your back as relaxed as possible, slowly straighten your elbows while keeping your hips on the floor. You may adjust the placement of your hands to maximize your comfort. As you gain motion, your hands will come more underneath your shoulders.  Hold this position __________ seconds.  Slowly return to lying flat on the floor. Repeat __________ times. Complete this exercise __________ times per day.  STRENGTHENING EXERCISES - Sciatica  These exercises may help you when beginning to rehabilitate your injury. These exercises should be done near your "sweet spot." This is the neutral, low-back arch, somewhere between fully rounded and fully arched, that is your least painful position. When performed in this safe range of motion, these exercises can be used for people who have either a flexion or extension based injury. These exercises may resolve your symptoms with or without further involvement from your physician, physical therapist or athletic trainer. While completing these exercises, remember:   Muscles can gain both the endurance and the strength needed for everyday activities through controlled exercises.  Complete these exercises as instructed by your physician, physical therapist or athletic trainer. Progress with the resistance and repetition exercises only as your caregiver advises.  You may  experience muscle soreness or fatigue, but the pain or discomfort you are trying to eliminate should never worsen during these exercises. If this pain does worsen, stop and make certain you are following the directions exactly. If the pain is still present after adjustments, discontinue the exercise until you can discuss the trouble with your clinician. STRENGTHENING - Deep Abdominals, Pelvic Tilt   Lie on a firm bed or floor. Keeping your legs in front of you, bend your knees so they are both pointed toward the ceiling and your feet are flat on the floor.  Tense your lower abdominal muscles to press your low back into the floor. This motion will rotate your pelvis so that your tail bone is scooping upwards rather than pointing at your feet or into the floor.  With a gentle tension and even breathing, hold this position for __________ seconds. Repeat __________ times. Complete this exercise __________ times per day.  STRENGTHENING - Abdominals, Crunches   Lie on a firm bed or floor. Keeping your legs in front of you, bend your knees so they are both pointed toward the ceiling and your feet are flat on the   floor. Cross your arms over your chest.  Slightly tip your chin down without bending your neck.  Tense your abdominals and slowly lift your trunk high enough to just clear your shoulder blades. Lifting higher can put excessive stress on the low back and does not further strengthen your abdominal muscles.  Control your return to the starting position. Repeat __________ times. Complete this exercise __________ times per day.  STRENGTHENING - Quadruped, Opposite UE/LE Lift  Assume a hands and knees position on a firm surface. Keep your hands under your shoulders and your knees under your hips. You may place padding under your knees for comfort.  Find your neutral spine and gently tense your abdominal muscles so that you can maintain this position. Your shoulders and hips should form a rectangle  that is parallel with the floor and is not twisted.  Keeping your trunk steady, lift your right hand no higher than your shoulder and then your left leg no higher than your hip. Make sure you are not holding your breath. Hold this position __________ seconds.  Continuing to keep your abdominal muscles tense and your back steady, slowly return to your starting position. Repeat with the opposite arm and leg. Repeat __________ times. Complete this exercise __________ times per day.  STRENGTHENING - Abdominals and Quadriceps, Straight Leg Raise   Lie on a firm bed or floor with both legs extended in front of you.  Keeping one leg in contact with the floor, bend the other knee so that your foot can rest flat on the floor.  Find your neutral spine, and tense your abdominal muscles to maintain your spinal position throughout the exercise.  Slowly lift your straight leg off the floor about 6 inches for a count of 15, making sure to not hold your breath.  Still keeping your neutral spine, slowly lower your leg all the way to the floor. Repeat this exercise with each leg __________ times. Complete this exercise __________ times per day. POSTURE AND BODY MECHANICS CONSIDERATIONS - Sciatica Keeping correct posture when sitting, standing or completing your activities will reduce the stress put on different body tissues, allowing injured tissues a chance to heal and limiting painful experiences. The following are general guidelines for improved posture. Your physician or physical therapist will provide you with any instructions specific to your needs. While reading these guidelines, remember:  The exercises prescribed by your provider will help you have the flexibility and strength to maintain correct postures.  The correct posture provides the optimal environment for your joints to work. All of your joints have less wear and tear when properly supported by a spine with good posture. This means you will  experience a healthier, less painful body.  Correct posture must be practiced with all of your activities, especially prolonged sitting and standing. Correct posture is as important when doing repetitive low-stress activities (typing) as it is when doing a single heavy-load activity (lifting). RESTING POSITIONS Consider which positions are most painful for you when choosing a resting position. If you have pain with flexion-based activities (sitting, bending, stooping, squatting), choose a position that allows you to rest in a less flexed posture. You would want to avoid curling into a fetal position on your side. If your pain worsens with extension-based activities (prolonged standing, working overhead), avoid resting in an extended position such as sleeping on your stomach. Most people will find more comfort when they rest with their spine in a more neutral position, neither too rounded nor too   arched. Lying on a non-sagging bed on your side with a pillow between your knees, or on your back with a pillow under your knees will often provide some relief. Keep in mind, being in any one position for a prolonged period of time, no matter how correct your posture, can still lead to stiffness. PROPER SITTING POSTURE In order to minimize stress and discomfort on your spine, you must sit with correct posture Sitting with good posture should be effortless for a healthy body. Returning to good posture is a gradual process. Many people can work toward this most comfortably by using various supports until they have the flexibility and strength to maintain this posture on their own. When sitting with proper posture, your ears will fall over your shoulders and your shoulders will fall over your hips. You should use the back of the chair to support your upper back. Your low back will be in a neutral position, just slightly arched. You may place a small pillow or folded towel at the base of your low back for support.  When  working at a desk, create an environment that supports good, upright posture. Without extra support, muscles fatigue and lead to excessive strain on joints and other tissues. Keep these recommendations in mind: CHAIR:   A chair should be able to slide under your desk when your back makes contact with the back of the chair. This allows you to work closely.  The chair's height should allow your eyes to be level with the upper part of your monitor and your hands to be slightly lower than your elbows. BODY POSITION  Your feet should make contact with the floor. If this is not possible, use a foot rest.  Keep your ears over your shoulders. This will reduce stress on your neck and low back. INCORRECT SITTING POSTURES   If you are feeling tired and unable to assume a healthy sitting posture, do not slouch or slump. This puts excessive strain on your back tissues, causing more damage and pain. Healthier options include:  Using more support, like a lumbar pillow.  Switching tasks to something that requires you to be upright or walking.  Talking a brief walk.  Lying down to rest in a neutral-spine position. PROLONGED STANDING WHILE SLIGHTLY LEANING FORWARD  When completing a task that requires you to lean forward while standing in one place for a long time, place either foot up on a stationary 2-4 inch high object to help maintain the best posture. When both feet are on the ground, the low back tends to lose its slight inward curve. If this curve flattens (or becomes too large), then the back and your other joints will experience too much stress, fatigue more quickly and can cause pain.  CORRECT STANDING POSTURES Proper standing posture should be assumed with all daily activities, even if they only take a few moments, like when brushing your teeth. As in sitting, your ears should fall over your shoulders and your shoulders should fall over your hips. You should keep a slight tension in your abdominal  muscles to brace your spine. Your tailbone should point down to the ground, not behind your body, resulting in an over-extended swayback posture.  INCORRECT STANDING POSTURES  Common incorrect standing postures include a forward head, locked knees and/or an excessive swayback. WALKING Walk with an upright posture. Your ears, shoulders and hips should all line-up. PROLONGED ACTIVITY IN A FLEXED POSITION When completing a task that requires you to bend forward   at your waist or lean over a low surface, try to find a way to stabilize 3 of 4 of your limbs. You can place a hand or elbow on your thigh or rest a knee on the surface you are reaching across. This will provide you more stability so that your muscles do not fatigue as quickly. By keeping your knees relaxed, or slightly bent, you will also reduce stress across your low back. CORRECT LIFTING TECHNIQUES DO :   Assume a wide stance. This will provide you more stability and the opportunity to get as close as possible to the object which you are lifting.  Tense your abdominals to brace your spine; then bend at the knees and hips. Keeping your back locked in a neutral-spine position, lift using your leg muscles. Lift with your legs, keeping your back straight.  Test the weight of unknown objects before attempting to lift them.  Try to keep your elbows locked down at your sides in order get the best strength from your shoulders when carrying an object.  Always ask for help when lifting heavy or awkward objects. INCORRECT LIFTING TECHNIQUES DO NOT:   Lock your knees when lifting, even if it is a small object.  Bend and twist. Pivot at your feet or move your feet when needing to change directions.  Assume that you cannot safely pick up a paperclip without proper posture. Document Released: 03/21/2005 Document Revised: 08/05/2013 Document Reviewed: 07/03/2008 [employer] Patient Information 2015 [employer]. This information is not intended to  replace advice given to you by your health care provider. Make sure you discuss any questions you have with your health care provider.  

## 2014-01-16 NOTE — Progress Notes (Signed)
   Subjective:    Patient ID: [name] [name], male    DOB: [birth year], 48 y.o.   MRN: [id number]  HPI 48 y.o. male with back pain. He states that he has had back pain for a long time, right leg radiating down his his lateral leg to his ankle. He has had a history of back pain, was suppose to have surgery in 2011 at [facility]. It has been flariing more often recently, riding int he car is painful. It was worse on Monday, was in [place] the past weekend walking a lot, has been taking ibuprofen for it. Denies trouble with urinating, weakness.   Sees Dr. Posey Pronto for his DM and he gets his labs there.   Review of Systems  Constitutional: Negative.  Negative for fever.  HENT: Negative.   Respiratory: Negative.   Cardiovascular: Negative.  Negative for chest pain.  Gastrointestinal: Negative.  Negative for abdominal pain.  Genitourinary: Negative.  Negative for dysuria.  Musculoskeletal: Positive for back pain and gait problem. Negative for arthralgias, joint swelling, myalgias, neck pain and neck stiffness.  Skin: Negative.   Neurological: Negative.  Negative for weakness, numbness and headaches.       Objective:   Physical Exam  Constitutional: He is oriented to person, place, and time. He appears well-developed and well-nourished.  HENT:  Head: Normocephalic and atraumatic.  Right Ear: External ear normal.  Left Ear: External ear normal.  Mouth/Throat: Oropharynx is clear and moist.  Eyes: Conjunctivae and EOM are normal. Pupils are equal, round, and reactive to light.  Neck: Normal range of motion. Neck supple.  Cardiovascular: Normal rate, regular rhythm and normal heart sounds.   Pulmonary/Chest: Effort normal and breath sounds normal.  Abdominal: Soft. Bowel sounds are normal.  Musculoskeletal: Normal range of motion.  Patient is able to ambulate well.  Gait is  antalgic. Straight leg raising negative bilaterally for radicular symptoms. Sensory exam in the legs is normal.   Knee reflexes are abnormal, but difficult to get Ankle reflexes are normal Strength is normal and symmetric. There isparaspinal muscle spasm.  There is not midline tenderness.  ROM of spine with normal flexion, extension, lateral range of motion to the right and left, and rotation to the right and left.  Neurological: He is alert and oriented to person, place, and time. No cranial nerve deficit.  Skin: Skin is warm and dry.  Psychiatric: He has a normal mood and affect. His behavior is normal.        Assessment & Plan:  Right SI pain, negative straight leg, no bowel/bladder problems Mobic,  RICE, and exercise given, Norco 5 325, and will excuse from work Going to see neurosurgeon on Monday

## 2014-01-16 NOTE — Telephone Encounter (Signed)
LEFT MESSAGE TO CALL AND SCHEDULE AN APPT  Thank you, Kat[facility] [phone] Fax [phone]

## 2014-01-26 ENCOUNTER — Other Ambulatory Visit: Payer: Self-pay | Admitting: Emergency Medicine

## 2014-02-05 ENCOUNTER — Encounter: Payer: Self-pay | Admitting: *Deleted

## 2014-02-26 ENCOUNTER — Encounter: Payer: Self-pay | Admitting: *Deleted

## 2014-03-04 ENCOUNTER — Encounter: Payer: Self-pay | Admitting: *Deleted

## 2014-03-11 ENCOUNTER — Other Ambulatory Visit: Payer: Self-pay | Admitting: Physician Assistant

## 2014-04-29 ENCOUNTER — Other Ambulatory Visit: Payer: Self-pay | Admitting: Family Medicine

## 2014-07-22 ENCOUNTER — Encounter (HOSPITAL_COMMUNITY): Payer: Self-pay | Admitting: Neurology

## 2014-07-22 ENCOUNTER — Emergency Department (HOSPITAL_COMMUNITY)
Admission: EM | Admit: 2014-07-22 | Discharge: 2014-07-22 | Disposition: A | Discharge status: Home / Self Care | Payer: BLUE CROSS/BLUE SHIELD | Attending: Emergency Medicine | Admitting: Emergency Medicine

## 2014-07-22 DIAGNOSIS — J45909 Unspecified asthma, uncomplicated: Secondary | ICD-10-CM | POA: Insufficient documentation

## 2014-07-22 DIAGNOSIS — Z791 Long term (current) use of non-steroidal anti-inflammatories (NSAID): Secondary | ICD-10-CM | POA: Diagnosis not present

## 2014-07-22 DIAGNOSIS — Z7982 Long term (current) use of aspirin: Secondary | ICD-10-CM | POA: Diagnosis not present

## 2014-07-22 DIAGNOSIS — E039 Hypothyroidism, unspecified: Secondary | ICD-10-CM | POA: Diagnosis not present

## 2014-07-22 DIAGNOSIS — Z8669 Personal history of other diseases of the nervous system and sense organs: Secondary | ICD-10-CM | POA: Diagnosis not present

## 2014-07-22 DIAGNOSIS — E785 Hyperlipidemia, unspecified: Secondary | ICD-10-CM | POA: Insufficient documentation

## 2014-07-22 DIAGNOSIS — M545 Low back pain: Secondary | ICD-10-CM | POA: Diagnosis present

## 2014-07-22 DIAGNOSIS — I10 Essential (primary) hypertension: Secondary | ICD-10-CM | POA: Insufficient documentation

## 2014-07-22 DIAGNOSIS — Z79899 Other long term (current) drug therapy: Secondary | ICD-10-CM | POA: Insufficient documentation

## 2014-07-22 DIAGNOSIS — E119 Type 2 diabetes mellitus without complications: Secondary | ICD-10-CM | POA: Insufficient documentation

## 2014-07-22 DIAGNOSIS — Z794 Long term (current) use of insulin: Secondary | ICD-10-CM | POA: Diagnosis not present

## 2014-07-22 DIAGNOSIS — M62830 Muscle spasm of back: Secondary | ICD-10-CM | POA: Insufficient documentation

## 2014-07-22 DIAGNOSIS — F419 Anxiety disorder, unspecified: Secondary | ICD-10-CM | POA: Insufficient documentation

## 2014-07-22 MED ORDER — DIAZEPAM 5 MG/ML IJ SOLN: 5.0000 mg | Freq: Once | INTRAMUSCULAR | Status: DC

## 2014-07-22 MED ORDER — KETOROLAC TROMETHAMINE 60 MG/2ML IM SOLN
60.0000 mg | Freq: Once | INTRAMUSCULAR | Status: AC
  Administered 2014-07-22: 60 mg via INTRAMUSCULAR
  Filled 2014-07-22: qty 2

## 2014-07-22 MED ORDER — DIAZEPAM 5 MG/ML IJ SOLN
5.0000 mg | Freq: Once | INTRAMUSCULAR | Status: AC
  Administered 2014-07-22: 5 mg via INTRAMUSCULAR
  Filled 2014-07-22: qty 2

## 2014-07-22 MED ORDER — HYDROCODONE-ACETAMINOPHEN 5-325 MG PO TABS: 2.0000 | ORAL_TABLET | ORAL | Status: DC | PRN

## 2014-07-22 MED ORDER — CYCLOBENZAPRINE HCL 10 MG PO TABS: 10.0000 mg | ORAL_TABLET | Freq: Two times a day (BID) | ORAL | Status: DC | PRN

## 2014-07-22 NOTE — ED Provider Notes (Signed)
CSN: [id number]     Arrival date & time 07/22/14  4854 History  This chart was scribed for non-physician practitioner, Alvina Chou, working with Carmin Muskrat, MD by Molli Posey, ED Scribe. This patient was seen in room TR09C/TR09C and the patient's care was started at 9:39 AM.   Chief Complaint  Patient presents with  . Back Pain   The history is provided by the patient. No language interpreter was used.    HPI Comments: [name] [name] is a 49 y.o. male with a history of hypothyroidism, hyperlipidemia, DM, asthma and HTN who presents to the Emergency Department complaining of moderate bilateral lower back pain that started yesterday. He states that his right side is worse than his left. Pt states he bent over to pick up his dog yesterday when he aggravated his back. Pt states he is being treated for sciatica and a disc issue that he received injections for in November 2015. He states that he took 3 ibuprofen this morning. Pt reports no alleviating factors at this time. He denies abdominal pain.   Past Medical History  Diagnosis Date  . NASH (nonalcoholic steatohepatitis)   . Numbness   . Cough   . Lumbar back pain   . Hypothyroidism   . Hyperlipidemia   . Diabetes mellitus type II   . ASTHMA 10/28/2006  . OSA (obstructive sleep apnea) 11/19/2010  . Anxiety   . Hypertension     DR TODD PCP,PT HAS NOT SEEN >3 YRS   Past Surgical History  Procedure Laterality Date  . Right knee repair  1997  . Thyroidectomy      right  . Bilateral shoulder surgery    . Trigger finger release    . Trigger finger release  02/23/2011    Procedure: RELEASE TRIGGER FINGER/A-1 PULLEY;  Surgeon: Meredith Pel;  Location: [facility];  Service: Orthopedics;  Laterality: Right;  Right 4th trigger finger release   Family History  Problem Relation Age of Onset  . Osteoarthritis Father   . Heart attack Father   . Breast cancer Mother   . Skin cancer Father    History  Substance Use Topics   . Smoking status: Never Smoker   . Smokeless tobacco: Not on file  . Alcohol Use: Yes     Comment: 4 beers per week    Review of Systems  Gastrointestinal: Negative for abdominal pain.  Musculoskeletal: Positive for back pain.  All other systems reviewed and are negative.  Allergies  Review of patient's allergies indicates no known allergies.  Home Medications   Prior to Admission medications   Medication Sig Start Date End Date Taking? Authorizing Provider  amitriptyline (ELAVIL) 50 MG tablet Take 1 tablet (50 mg total) by mouth at bedtime. 10/10/13   Kelby Aline, PA-C  aspirin 81 MG tablet Take 81 mg by mouth daily.      Historical Provider, MD  atorvastatin (LIPITOR) 40 MG tablet Take 40 mg by mouth daily.      Historical Provider, MD  Canagliflozin (INVOKANA) 300 MG TABS Take 300 mg by mouth daily.    Historical Provider, MD  citalopram (CELEXA) 40 MG tablet TAKE 1 TABLET ONCE DAILY. 03/11/14   Unk Pinto, MD  glucose blood (ACCU-CHEK COMPACT STRIPS) test strip Use as instructed     Historical Provider, MD  HYDROcodone-acetaminophen (NORCO) 5-325 MG per tablet Take 1 tablet by mouth every 6 (six) hours as needed for moderate pain. 01/16/14   Vicie Mutters, PA-C  ibuprofen (  ADVIL,MOTRIN) 800 MG tablet Take 800 mg by mouth every 8 (eight) hours as needed.    Historical Provider, MD  insulin glargine (LANTUS) 100 UNIT/ML injection Inject 60 Units into the skin 2 (two) times daily.     Historical Provider, MD  insulin lispro (HUMALOG KWIKPEN) 100 UNIT/ML injection Inject 50 Units into the skin 3 (three) times daily with meals. 50 units not exceeding 200 units a day    Historical Provider, MD  levothyroxine (SYNTHROID, LEVOTHROID) 137 MCG tablet Take 137 mcg by mouth daily.      Historical Provider, MD  losartan-hydrochlorothiazide (HYZAAR) 100-25 MG per tablet Take 1 tablet by mouth daily. 03/09/11   Dorena Cookey, MD  meloxicam [facility]) 15 MG tablet Once daily with food for pain  01/16/14   Vicie Mutters, PA-C  metFORMIN (GLUCOPHAGE) 500 MG tablet Take 500 mg by mouth. 2 tabs daily    Historical Provider, MD  omeprazole (PRILOSEC) 20 MG capsule TAKE (1) CAPSULE DAILY. 05/15/13   Dorena Cookey, MD   BP 113/73 mmHg  Pulse 85  Temp(Src) 98 F (36.7 C) (Oral)  Resp 17  Ht 5\' 10"  (1.778 m)  Wt 248 lb (112.492 kg)  BMI 35.58 kg/m2  SpO2 96% Physical Exam  Constitutional: He is oriented to person, place, and time. He appears well-developed and well-nourished.  HENT:  Head: Normocephalic and atraumatic.  Eyes: Right eye exhibits no discharge. Left eye exhibits no discharge.  Neck: Neck supple. No tracheal deviation present.  Cardiovascular: Normal rate.   Pulmonary/Chest: Effort normal. No respiratory distress.  Abdominal: Soft. He exhibits no distension. There is no tenderness. There is no rebound and no guarding.  Musculoskeletal:  No midline spine TTP. Bilateral paraspinal TTP of lower lumbar spine and sacral area.   Neurological: He is alert and oriented to person, place, and time.  Lower extremity strength and sensation equal and intact.   Skin: Skin is warm and dry.  Psychiatric: He has a normal mood and affect. His behavior is normal.  Nursing note and vitals reviewed.   ED Course  Procedures   DIAGNOSTIC STUDIES: Oxygen Saturation is 96% on RA, normal by my interpretation.    COORDINATION OF CARE: 9:42 AM Discussed treatment plan with pt at bedside and pt agreed to plan.   Labs Review Labs Reviewed - No data to display  Imaging Review No results found.   EKG Interpretation None      MDM   Final diagnoses:  Spasm of back muscles   10:46 AM Patient reports improvement of pain after toradol and valium. No bladder/bowel incontinence. Patient will have Vicodin and Flexeril prescriptions. Vitals stable and patient afebrile.   I personally performed the services described in this documentation, which was scribed in my presence. The  recorded information has been reviewed and is accurate.     Alvina Chou, PA-C 07/22/14 [place], MD 07/25/14 [phone]

## 2014-07-22 NOTE — Discharge Instructions (Signed)
Take Vicodin as needed for pain. Take Flexeril as needed for muscle spasm. Refer to attached documents for more information.

## 2014-07-22 NOTE — ED Notes (Signed)
Pt reports lower back pain; yesterday bent over to pet his dog since then lower back pain bilateral sides.

## 2014-07-22 NOTE — ED Notes (Signed)
Patient states took 3 x ibuprofen at 0800 this morning.   Patient states has not helped.   Patient states usually goes to Dr. Arneta Cliche for cortisone shots, but was not this bad x 2 wks ago.  Patient wants to get pain medicine.

## 2014-08-01 ENCOUNTER — Other Ambulatory Visit: Payer: Self-pay | Admitting: Internal Medicine

## 2014-09-20 ENCOUNTER — Other Ambulatory Visit: Payer: Self-pay | Admitting: Internal Medicine

## 2014-09-23 ENCOUNTER — Other Ambulatory Visit: Payer: Self-pay | Admitting: Internal Medicine

## 2014-10-04 ENCOUNTER — Other Ambulatory Visit: Payer: Self-pay | Admitting: Internal Medicine

## 2014-10-04 DIAGNOSIS — F411 Generalized anxiety disorder: Secondary | ICD-10-CM

## 2014-10-08 ENCOUNTER — Encounter: Payer: Self-pay | Admitting: *Deleted

## 2014-11-04 ENCOUNTER — Other Ambulatory Visit: Payer: Self-pay | Admitting: Internal Medicine

## 2014-11-10 ENCOUNTER — Encounter: Payer: Self-pay | Admitting: Physician Assistant

## 2014-11-10 ENCOUNTER — Ambulatory Visit (INDEPENDENT_AMBULATORY_CARE_PROVIDER_SITE_OTHER): Payer: BLUE CROSS/BLUE SHIELD | Admitting: Physician Assistant

## 2014-11-10 VITALS — BP 136/90 | HR 84 | Temp 98.1°F | Resp 16 | Ht 71.0 in | Wt 250.0 lb

## 2014-11-10 DIAGNOSIS — F329 Major depressive disorder, single episode, unspecified: Secondary | ICD-10-CM

## 2014-11-10 DIAGNOSIS — Z794 Long term (current) use of insulin: Secondary | ICD-10-CM

## 2014-11-10 DIAGNOSIS — E119 Type 2 diabetes mellitus without complications: Secondary | ICD-10-CM | POA: Diagnosis not present

## 2014-11-10 DIAGNOSIS — F32A Depression, unspecified: Secondary | ICD-10-CM

## 2014-11-10 DIAGNOSIS — E663 Overweight: Secondary | ICD-10-CM | POA: Insufficient documentation

## 2014-11-10 DIAGNOSIS — E1169 Type 2 diabetes mellitus with other specified complication: Secondary | ICD-10-CM | POA: Insufficient documentation

## 2014-11-10 DIAGNOSIS — E785 Hyperlipidemia, unspecified: Secondary | ICD-10-CM | POA: Diagnosis not present

## 2014-11-10 DIAGNOSIS — F411 Generalized anxiety disorder: Secondary | ICD-10-CM | POA: Diagnosis not present

## 2014-11-10 DIAGNOSIS — E669 Obesity, unspecified: Secondary | ICD-10-CM | POA: Diagnosis not present

## 2014-11-10 DIAGNOSIS — K2100 Gastro-esophageal reflux disease with esophagitis, without bleeding: Secondary | ICD-10-CM

## 2014-11-10 DIAGNOSIS — E782 Mixed hyperlipidemia: Secondary | ICD-10-CM

## 2014-11-10 MED ORDER — CITALOPRAM HYDROBROMIDE 40 MG PO TABS: 40.0000 mg | ORAL_TABLET | Freq: Every day | ORAL | Status: DC

## 2014-11-10 MED ORDER — OMEPRAZOLE 20 MG PO CPDR: DELAYED_RELEASE_CAPSULE | ORAL | Status: DC

## 2014-11-10 NOTE — Progress Notes (Signed)
Assessment and Plan:  1. Hypertension -Continue medication, monitor blood pressure at home. Continue DASH diet.  Reminder to go to the ER if any CP, SOB, nausea, dizziness, severe HA, changes vision/speech, left arm numbness and tingling and jaw pain.  2. Cholesterol -Continue diet and exercise. Check cholesterol.   3. Diabetes unknown -Continue diet and exercise. Check A1C  4. Vitamin D Def - check level and continue medications.  5. Obesity with co morbidities - long discussion about weight loss, diet, and exercise   6. OSA Not on CPAP, will get sleep study, discussed risk and given information  7. Right carpal tunnel -wear a brace at night 6-8 weeks, RICE, NSAIDS, if not better will refer to ortho  Continue diet and meds as discussed. Further disposition pending results of labs. Discussed med's effects and SE's.    Over 30 minutes of exam, counseling, chart review, and critical decision making was performed   HPI 48 y.o. male  presents for 3 month follow up on hypertension, cholesterol, diabetes and vitamin D deficiency.   His blood pressure has been controlled at home, today his BP is BP: 136/90 mmHg (drank energy drink before appt).  He does not workout but wants to start walking bc [place] is going [place] in [place]. He denies chest pain, shortness of breath, dizziness.  He is on cholesterol medication and denies myalgias. His cholesterol is not at goal. The cholesterol is unknown, will request labs from Dr. Posey Pronto  He has been working on diet and exercise for diabetes unknown complications, will request labs, he is on bASA, he is on ACE/ARB, follows with Dr. Posey Pronto a 3 months and denies  hypoglycemia , paresthesia of the feet and visual disturbances. Last A1C was: will request labs from Dr. Posey Pronto.  Patient is on Vitamin D supplement. He is on celexa 40mg  for mood/depression which he states helps.  He is on elavil and flexeril for back pain.  BMI is Body mass index is 34.88  kg/(m^2)., he is working on diet and exercise. He has OSA but is not on CPAP due to intolerance.  Wt Readings from Last 3 Encounters:  11/10/14 250 lb (113.399 kg)  07/22/14 248 lb (112.492 kg)  01/16/14 247 lb (112.038 kg)    Current Medications:  Current Outpatient Prescriptions on File Prior to Visit  Medication Sig Dispense Refill  . amitriptyline (ELAVIL) 50 MG tablet Take 1 tablet (50 mg total) by mouth at bedtime. 100 tablet 3  . aspirin 81 MG tablet Take 81 mg by mouth daily.      Marland Kitchen atorvastatin (LIPITOR) 40 MG tablet Take 40 mg by mouth daily.      . Canagliflozin (INVOKANA) 300 MG TABS Take 300 mg by mouth daily.    . citalopram (CELEXA) 40 MG tablet TAKE 1 TABLET ONCE DAILY. 30 tablet 0  . cyclobenzaprine (FLEXERIL) 10 MG tablet Take 1 tablet (10 mg total) by mouth 2 (two) times daily as needed for muscle spasms. 20 tablet 0  . glucose blood (ACCU-CHEK COMPACT STRIPS) test strip Use as instructed     . HYDROcodone-acetaminophen (NORCO/VICODIN) 5-325 MG per tablet Take 2 tablets by mouth every 4 (four) hours as needed. 12 tablet 0  . ibuprofen (ADVIL,MOTRIN) 800 MG tablet Take 800 mg by mouth every 8 (eight) hours as needed.    . insulin glargine (LANTUS) 100 UNIT/ML injection Inject 60 Units into the skin 2 (two) times daily.     . insulin lispro (HUMALOG KWIKPEN)  100 UNIT/ML injection Inject 50 Units into the skin 3 (three) times daily with meals. 50 units not exceeding 200 units a day    . levothyroxine (SYNTHROID, LEVOTHROID) 137 MCG tablet Take 137 mcg by mouth daily.      Marland Kitchen losartan-hydrochlorothiazide (HYZAAR) 100-25 MG per tablet Take 1 tablet by mouth daily. 90 tablet 1  . meloxicam (MOBIC) 15 MG tablet Once daily with food for pain 30 tablet 3  . metFORMIN (GLUCOPHAGE) 500 MG tablet Take 500 mg by mouth. 2 tabs daily    . omeprazole (PRILOSEC) 20 MG capsule TAKE (1) CAPSULE DAILY. 90 capsule 2   No current facility-administered medications on file prior to visit.    Medical History:  Past Medical History  Diagnosis Date  . NASH (nonalcoholic steatohepatitis)   . Numbness   . Cough   . Lumbar back pain   . Hypothyroidism   . Hyperlipidemia   . Diabetes mellitus type II   . ASTHMA 10/28/2006  . OSA (obstructive sleep apnea) 11/19/2010  . Anxiety   . Hypertension     DR TODD PCP,PT HAS NOT SEEN >3 YRS   Allergies: No Known Allergies   Review of Systems:  Review of Systems  Constitutional: Negative.   HENT: Negative.   Eyes: Negative.   Respiratory: Negative.   Cardiovascular: Negative.   Gastrointestinal: Negative.   Genitourinary: Negative.   Musculoskeletal: Positive for myalgias (right hand). Negative for back pain, joint pain, falls and neck pain.  Skin: Negative.   Neurological: Positive for tingling (right hand). Negative for dizziness, tremors, sensory change, speech change, focal weakness, seizures and loss of consciousness.  Endo/Heme/Allergies: Negative.   Psychiatric/Behavioral: Negative.     Family history- Review and unchanged Social history- Review and unchanged Physical Exam: BP 136/90 mmHg  Pulse 84  Temp(Src) 98.1 F (36.7 C)  Resp 16  Ht 5\' 11"  (1.803 m)  Wt 250 lb (113.399 kg)  BMI 34.88 kg/m2 Wt Readings from Last 3 Encounters:  11/10/14 250 lb (113.399 kg)  07/22/14 248 lb (112.492 kg)  01/16/14 247 lb (112.038 kg)   General Appearance: Well nourished, in no apparent distress. Eyes: PERRLA, EOMs, conjunctiva no swelling or erythema Sinuses: No Frontal/maxillary tenderness ENT/Mouth: Ext aud canals clear, TMs without erythema, bulging. No erythema, swelling, or exudate on post pharynx.  Tonsils not swollen or erythematous. Hearing normal.  Neck: Supple, thyroid normal.  Respiratory: Respiratory effort normal, BS equal bilaterally without rales, rhonchi, wheezing or stridor.  Cardio: RRR with no MRGs. Brisk peripheral pulses without edema.  Abdomen: Soft, + BS.  Non tender, no guarding, rebound, hernias,  masses. Lymphatics: Non tender without lymphadenopathy.  Musculoskeletal: Full ROM, 5/5 strength, Normal gait. Right carpal tunnel compression test is positive, neurovascular exam is normal and no thenar or hypothenar atrophy Skin: Warm, dry without rashes, lesions, ecchymosis.  Neuro: Cranial nerves intact. No cerebellar symptoms.  Psych: Awake and oriented X 3, normal affect, Insight and Judgment appropriate.    Vicie Mutters, PA-C 9:43 AM [facility]

## 2014-11-10 NOTE — Patient Instructions (Addendum)
Diabetes is a very complicated disease...lets simplify it.  An easy way to look at it to understand the complications is if you think of the extra sugar floating in your blood stream as glass shards floating through your blood stream.    Diabetes affects your small vessels first: 1) The glass shards (sugar) scraps down the tiny blood vessels in your eyes and lead to diabetic retinopathy, the leading cause of blindness in the [place]. Diabetes is the leading cause of newly diagnosed adult (79 to 51 years of age) blindness in the [place].  2) The glass shards scratches down the tiny vessels of your legs leading to nerve damage called neuropathy and can lead to amputations of your feet. More than 60% of all non-traumatic amputations of lower limbs occur in people with diabetes.  3) Over time the small vessels in your brain are shredded and closed off, individually this does not cause any problems but over a long period of time many of the small vessels being blocked can lead to Vascular Dementia.   4) Your kidney's are a filter system and have a "net" that keeps certain things in the body and lets bad things out. Sugar shreds this net and leads to kidney damage and eventually failure. Decreasing the sugar that is destroying the net and certain blood pressure medications can help stop or decrease progression of kidney disease. Diabetes was the primary cause of kidney failure in 44 percent of all new cases in 2011.  5) Diabetes also destroys the small vessels in your penis that lead to erectile dysfunction. Eventually the vessels are so damaged that you may not be responsive to cialis or viagra.   Diabetes and your large vessels: Your larger vessels consist of your coronary arteries in your heart and the carotid vessels to your brain. Diabetes or even increased sugars put you at 300% increased risk of heart attack and stroke and this is why.. The sugar scrapes down your large blood vessels and your body  sees this as an internal injury and tries to repair itself. Just like you get a scab on your skin, your platelets will stick to the blood vessel wall trying to heal it. This is why we have diabetics on low dose aspirin daily, this prevents the platelets from sticking and can prevent plaque formation. In addition, your body takes cholesterol and tries to shove it into the open wound. This is why we want your LDL, or bad cholesterol, below 70.   The combination of platelets and cholesterol over 5-10 years forms plaque that can break off and cause a heart attack or stroke.   PLEASE REMEMBER:  Diabetes is preventable! Up to 51 percent of complications and morbidities among individuals with type 2 diabetes can be prevented, delayed, or effectively treated and minimized with regular visits to a health professional, appropriate monitoring and medication, and a healthy diet and lifestyle.  I think it is possible that you have sleep apnea. It can cause interrupted sleep, headaches, frequent awakenings, fatigue, dry mouth, fast/slow heart beats, memory issues, anxiety/depression, swelling, numbness tingling hands/feet, weight gain, shortness of breath, and the list goes on. Sleep apnea needs to be ruled out because if it is left untreated it does eventually lead to abnormal heart beats, lung failure or heart failure as well as increasing the risk of heart attack and stroke. There are masks you can wear OR a mouth piece that I can give you information about. Often times though people feel  MUCH better after getting treatment.   Sleep Apnea  Sleep apnea is a sleep disorder characterized by abnormal pauses in breathing while you sleep. When your breathing pauses, the level of oxygen in your blood decreases. This causes you to move out of deep sleep and into light sleep. As a result, your quality of sleep is poor, and the system that carries your blood throughout your body (cardiovascular system) experiences stress. If  sleep apnea remains untreated, the following conditions can develop:  High blood pressure (hypertension).  Coronary artery disease.  Inability to achieve or maintain an erection (impotence).  Impairment of your thought process (cognitive dysfunction). There are three types of sleep apnea: 1. Obstructive sleep apnea--Pauses in breathing during sleep because of a blocked airway. 2. Central sleep apnea--Pauses in breathing during sleep because the area of the brain that controls your breathing does not send the correct signals to the muscles that control breathing. 3. Mixed sleep apnea--A combination of both obstructive and central sleep apnea.  RISK FACTORS The following risk factors can increase your risk of developing sleep apnea:  Being overweight.  Smoking.  Having narrow passages in your nose and throat.  Being of older age.  Being male.  Alcohol use.  Sedative and tranquilizer use.  Ethnicity. Among individuals younger than 35 years, African Americans are at increased risk of sleep apnea. SYMPTOMS   Difficulty staying asleep.  Daytime sleepiness and fatigue.  Loss of energy.  Irritability.  Loud, heavy snoring.  Morning headaches.  Trouble concentrating.  Forgetfulness.  Decreased interest in sex. DIAGNOSIS  In order to diagnose sleep apnea, your caregiver will perform a physical examination. Your caregiver may suggest that you take a home sleep test. Your caregiver may also recommend that you spend the night in a sleep lab. In the sleep lab, several monitors record information about your heart, lungs, and brain while you sleep. Your leg and arm movements and blood oxygen level are also recorded. TREATMENT The following actions may help to resolve mild sleep apnea:  Sleeping on your side.   Using a decongestant if you have nasal congestion.   Avoiding the use of depressants, including alcohol, sedatives, and narcotics.   Losing weight and modifying  your diet if you are overweight. There also are devices and treatments to help open your airway:  Oral appliances. These are custom-made mouthpieces that shift your lower jaw forward and slightly open your bite. This opens your airway.  Devices that create positive airway pressure. This positive pressure "splints" your airway open to help you breathe better during sleep. The following devices create positive airway pressure:  Continuous positive airway pressure (CPAP) device. The CPAP device creates a continuous level of air pressure with an air pump. The air is delivered to your airway through a mask while you sleep. This continuous pressure keeps your airway open.  Nasal expiratory positive airway pressure (EPAP) device. The EPAP device creates positive air pressure as you exhale. The device consists of single-use valves, which are inserted into each nostril and held in place by adhesive. The valves create very little resistance when you inhale but create much more resistance when you exhale. That increased resistance creates the positive airway pressure. This positive pressure while you exhale keeps your airway open, making it easier to breath when you inhale again.  Bilevel positive airway pressure (BPAP) device. The BPAP device is used mainly in patients with central sleep apnea. This device is similar to the CPAP device because it also  uses an air pump to deliver continuous air pressure through a mask. However, with the BPAP machine, the pressure is set at two different levels. The pressure when you exhale is lower than the pressure when you inhale.  Surgery. Typically, surgery is only done if you cannot comply with less invasive treatments or if the less invasive treatments do not improve your condition. Surgery involves removing excess tissue in your airway to create a wider passage way. Document Released: 03/11/2002 Document Revised: 07/16/2012 Document Reviewed: 07/28/2011 [name] Patient  Information 2015 [place], [place]. This information is not intended to replace advice given to you by your health care provider. Make sure you discuss any questions you have with your health care provider.   Can call Dr. Toy Cookey to get evaluated for dental sleep appliance. # [phone]  OR you can try Dr. Clearence Ped in [name] # [phone]  We will send notes. Call and get price quote on both.   GETTING OFF OF PPI's    Nexium/protonix/prilosec/Omeprazole/Dexilant/Aciphex are called PPI's, they are great at healing your stomach but should only be taken for a short period of time.     Recent studies have shown that taken for a long time they  can increase the risk of osteoporosis (weakening of your bones), pneumonia, low magnesium, restless legs, Cdiff (infection that causes diarrhea), DEMENTIA and most recently kidney damage / disease / insufficiency.     Due to this information we want to try to stop the PPI but if you try to stop it abruptly this can cause rebound acid and worsening symptoms.   So this is how we want you to get off the PPI:  - Start taking the nexium/protonix/prilosec/PPI  every other day with  zantac (ranitidine) 2 x a day for 2-4 weeks  - then decrease the PPI to every 3 days while taking the zantac (ranitidine) twice a day the other  days for 2-4  Weeks  - then you can try the zantac (ranitidine) once at night or up to 2 x day as needed.  - you can continue on this once at night or stop all together  - Avoid alcohol, spicy foods, NSAIDS (aleve, ibuprofen) at this time. See foods below.   +++++++++++++++++++++++++++++++++++++++++++  Food Choices for Gastroesophageal Reflux Disease  When you have gastroesophageal reflux disease (GERD), the foods you eat and your eating habits are very important. Choosing the right foods can help ease the discomfort of GERD. WHAT GENERAL GUIDELINES DO I NEED TO FOLLOW?  Choose fruits, vegetables, whole grains, low-fat  dairy products, and low-fat meat, fish, and poultry.  Limit fats such as oils, salad dressings, butter, nuts, and avocado.  Keep a food diary to identify foods that cause symptoms.  Avoid foods that cause reflux. These may be different for different people.  Eat frequent small meals instead of three large meals each day.  Eat your meals slowly, in a relaxed setting.  Limit fried foods.  Cook foods using methods other than frying.  Avoid drinking alcohol.  Avoid drinking large amounts of liquids with your meals.  Avoid bending over or lying down until 2-3 hours after eating.   WHAT FOODS ARE NOT RECOMMENDED? The following are some foods and drinks that may worsen your symptoms:  Vegetables Tomatoes. Tomato juice. Tomato and spaghetti sauce. Chili peppers. Onion and garlic. Horseradish. Fruits Oranges, grapefruit, and lemon (fruit and juice). Meats High-fat meats, fish, and poultry. This includes hot dogs, ribs, ham, sausage,  salami, and bacon. Dairy Whole milk and chocolate milk. Sour cream. Cream. Butter. Ice cream. Cream cheese.  Beverages Coffee and tea, with or without caffeine. Carbonated beverages or energy drinks. Condiments Hot sauce. Barbecue sauce.  Sweets/Desserts Chocolate and cocoa. Donuts. Peppermint and spearmint. Fats and Oils High-fat foods, including [place] fries and potato chips. Other Vinegar. Strong spices, such as black pepper, white pepper, red pepper, cayenne, curry powder, cloves, ginger, and chili powder. Nexium/protonix/prilosec are called PPI's, they are great at healing your stomach but should only be taken for a short period of time.

## 2015-05-07 ENCOUNTER — Other Ambulatory Visit: Payer: Self-pay | Admitting: Physician Assistant

## 2015-05-19 ENCOUNTER — Ambulatory Visit: Payer: Self-pay | Admitting: Physician Assistant

## 2015-06-27 DIAGNOSIS — E782 Mixed hyperlipidemia: Secondary | ICD-10-CM | POA: Insufficient documentation

## 2015-06-27 DIAGNOSIS — I10 Essential (primary) hypertension: Secondary | ICD-10-CM | POA: Insufficient documentation

## 2015-06-27 DIAGNOSIS — E039 Hypothyroidism, unspecified: Secondary | ICD-10-CM | POA: Insufficient documentation

## 2015-07-14 ENCOUNTER — Encounter: Payer: Self-pay | Admitting: Gastroenterology

## 2015-08-10 ENCOUNTER — Ambulatory Visit (INDEPENDENT_AMBULATORY_CARE_PROVIDER_SITE_OTHER): Payer: BLUE CROSS/BLUE SHIELD | Admitting: Internal Medicine

## 2015-08-10 ENCOUNTER — Encounter: Payer: Self-pay | Admitting: Internal Medicine

## 2015-08-10 VITALS — BP 140/86 | HR 96 | Temp 98.2°F | Resp 18 | Ht 69.5 in | Wt 243.0 lb

## 2015-08-10 DIAGNOSIS — Z0000 Encounter for general adult medical examination without abnormal findings: Secondary | ICD-10-CM | POA: Diagnosis not present

## 2015-08-10 DIAGNOSIS — Z125 Encounter for screening for malignant neoplasm of prostate: Secondary | ICD-10-CM | POA: Diagnosis not present

## 2015-08-10 DIAGNOSIS — Z136 Encounter for screening for cardiovascular disorders: Secondary | ICD-10-CM | POA: Diagnosis not present

## 2015-08-10 DIAGNOSIS — Z794 Long term (current) use of insulin: Secondary | ICD-10-CM

## 2015-08-10 DIAGNOSIS — Z23 Encounter for immunization: Secondary | ICD-10-CM | POA: Diagnosis not present

## 2015-08-10 DIAGNOSIS — Z79899 Other long term (current) drug therapy: Secondary | ICD-10-CM | POA: Diagnosis not present

## 2015-08-10 DIAGNOSIS — I10 Essential (primary) hypertension: Secondary | ICD-10-CM | POA: Diagnosis not present

## 2015-08-10 DIAGNOSIS — E119 Type 2 diabetes mellitus without complications: Secondary | ICD-10-CM

## 2015-08-10 DIAGNOSIS — Z130 Encounter for screening for diseases of the blood and blood-forming organs and certain disorders involving the immune mechanism: Secondary | ICD-10-CM

## 2015-08-10 DIAGNOSIS — Z1329 Encounter for screening for other suspected endocrine disorder: Secondary | ICD-10-CM

## 2015-08-10 DIAGNOSIS — E349 Endocrine disorder, unspecified: Secondary | ICD-10-CM

## 2015-08-10 DIAGNOSIS — Z0001 Encounter for general adult medical examination with abnormal findings: Secondary | ICD-10-CM

## 2015-08-10 DIAGNOSIS — N400 Enlarged prostate without lower urinary tract symptoms: Secondary | ICD-10-CM

## 2015-08-10 DIAGNOSIS — E559 Vitamin D deficiency, unspecified: Secondary | ICD-10-CM | POA: Diagnosis not present

## 2015-08-10 DIAGNOSIS — E785 Hyperlipidemia, unspecified: Secondary | ICD-10-CM

## 2015-08-10 DIAGNOSIS — Z1389 Encounter for screening for other disorder: Secondary | ICD-10-CM

## 2015-08-10 MED ORDER — TAMSULOSIN HCL 0.4 MG PO CAPS: 0.4000 mg | ORAL_CAPSULE | Freq: Every day | ORAL | Status: DC

## 2015-08-10 MED ORDER — MELOXICAM 15 MG PO TABS: ORAL_TABLET | ORAL | Status: DC

## 2015-08-10 NOTE — Progress Notes (Signed)
Complete Physical  Assessment and Plan:   1. Encounter for general adult medical examination with abnormal findings  - CBC with Differential/Platelet - BASIC METABOLIC PANEL WITH GFR - Hepatic function panel - Magnesium  2. Essential hypertension -cont meds -dash diet -exercise as tolerated -monitor at home  3. Diabetes mellitus type 2, insulin dependent ([place]) -followed by endocrine  4. Need for prophylactic vaccination with combined diphtheria-tetanus-pertussis (DTP) vaccine  - Tdap vaccine greater than or equal to 7yo IM  5. Hyperlipidemia Cont meds - Lipid panel  6. Screening for prostate cancer  - PSA  7. Screening for deficiency anemia  - Iron and TIBC - Vitamin B12  8. Testosterone deficiency  - Testosterone  9. Screening for hematuria or proteinuria  - Urinalysis, Routine w reflex microscopic (not at [facility]) - Microalbumin / creatinine urine ratio  10. Screening for cardiovascular condition  - EKG 12-Lead - [place], RETROPERITNL ABD,  LTD  11. Vitamin D deficiency  - VITAMIN D 25 Hydroxy (Vit-D Deficiency, Fractures)  12. Screening for hypothyroidism  - TSH  13. Benign prostate hyperplasia -flomax -avoid night time fluids    Discussed med's effects and SE's. Screening labs and tests as requested with regular follow-up as recommended.  HPI Patient presents for a complete physical.   His blood pressure has been controlled at home, today their BP is BP: 140/86 mmHg He does workout. He denies chest pain, shortness of breath, dizziness.   He is on cholesterol medication and denies myalgias. His cholesterol is at goal. The cholesterol last visit was:   Lab Results  Component Value Date   CHOL 176 11/07/2006   HDL 31.1* 11/07/2006   LDLDIRECT 120.3 11/07/2006   TRIG 228* 11/07/2006   CHOLHDL 5.7 CALC 11/07/2006    He has been working on diet and exercise for diabetes, he is on bASA, he is on ACE/ARB and denies foot ulcerations,  hyperglycemia, hypoglycemia , increased appetite, nausea, paresthesia of the feet, polydipsia, polyuria, visual disturbances, vomiting and weight loss. Last A1C in the office was:  Lab Results  Component Value Date   HGBA1C 9.5* 05/12/2009  He reports that he has been seeing his endocrinologist and he is starting his trulicity.  He reports that he started this last week.  He is still taking invokana as well.  He has lost a lot of weight on it.    Patient is on Vitamin D supplement.   No results found for: VD25OH    Last PSA was: No results found for: PSA.  Denies BPH symptoms daytime frequency, double voiding, dysuria, hematuria, hesitancy, incontinence, intermittency, nocturia, sensation of incomplete bladder emptying, suprapubic pain, urgency or weak urinary stream.  Current Medications:  Current Outpatient Prescriptions on File Prior to Visit  Medication Sig Dispense Refill  . amitriptyline (ELAVIL) 50 MG tablet Take 1 tablet (50 mg total) by mouth at bedtime. 100 tablet 3  . aspirin 81 MG tablet Take 81 mg by mouth daily.      Marland Kitchen atorvastatin (LIPITOR) 40 MG tablet Take 40 mg by mouth daily.      . Canagliflozin (INVOKANA) 300 MG TABS Take 300 mg by mouth daily.    . citalopram (CELEXA) 40 MG tablet TAKE 1 TABLET ONCE DAILY. 34 tablet 0  . glucose blood (ACCU-CHEK COMPACT STRIPS) test strip Use as instructed     . insulin glargine (LANTUS) 100 UNIT/ML injection Inject 60 Units into the skin 2 (two) times daily.     . insulin lispro (HUMALOG  KWIKPEN) 100 UNIT/ML injection Inject 50 Units into the skin 3 (three) times daily with meals. 50 units not exceeding 200 units a day    . levothyroxine (SYNTHROID, LEVOTHROID) 137 MCG tablet Take 137 mcg by mouth daily.      Marland Kitchen losartan-hydrochlorothiazide (HYZAAR) 100-25 MG per tablet Take 1 tablet by mouth daily. 90 tablet 1  . omeprazole (PRILOSEC) 20 MG capsule TAKE (1) CAPSULE DAILY. 90 capsule 2   No current facility-administered medications on  file prior to visit.    Health Maintenance:  Immunization History  Administered Date(s) Administered  . Influenza Split 01/16/2014  . Tdap 08/10/2015    Tetanus: 2017 Zostavax:Not due yet Colonoscopy: Scheduled for June Eye Exam: Dr. Marvel Plan , June 19th Dentist: Twice yearly  Patient Care Team: Unk Pinto, MD as PCP - General (Internal Medicine)  Allergies: No Known Allergies  Medical History:  Past Medical History  Diagnosis Date  . NASH (nonalcoholic steatohepatitis)   . Numbness   . Cough   . Lumbar back pain   . Hypothyroidism   . Hyperlipidemia   . Diabetes mellitus type II   . ASTHMA 10/28/2006  . OSA (obstructive sleep apnea) 11/19/2010  . Anxiety   . Hypertension     DR TODD PCP,PT HAS NOT SEEN >3 YRS    Surgical History:  Past Surgical History  Procedure Laterality Date  . Right knee repair  1997  . Thyroidectomy      right  . Bilateral shoulder surgery    . Trigger finger release    . Trigger finger release  02/23/2011    Procedure: RELEASE TRIGGER FINGER/A-1 PULLEY;  Surgeon: Meredith Pel;  Location: [facility];  Service: Orthopedics;  Laterality: Right;  Right 4th trigger finger release    Family History:  Family History  Problem Relation Age of Onset  . Osteoarthritis Father   . Heart attack Father   . Breast cancer Mother   . Skin cancer Father     Social History:   Social History  Substance Use Topics  . Smoking status: Never Smoker   . Smokeless tobacco: None  . Alcohol Use: Yes     Comment: 4 beers per week    Review of Systems:  Review of Systems  Constitutional: Negative for fever, chills and malaise/fatigue.  HENT: Negative for congestion, ear pain and sore throat.   Eyes: Negative.   Respiratory: Negative for cough, shortness of breath and wheezing.   Cardiovascular: Negative for chest pain, palpitations and leg swelling.  Gastrointestinal: Negative for heartburn, abdominal pain, diarrhea, constipation, blood in  stool and melena.  Genitourinary: Negative.   Skin: Negative.   Neurological: Negative for dizziness, sensory change, loss of consciousness and headaches.    Physical Exam: Estimated body mass index is 35.38 kg/(m^2) as calculated from the following:   Height as of this encounter: 5' 9.5" (1.765 m).   Weight as of this encounter: 243 lb (110.224 kg). BP 140/86 mmHg  Pulse 96  Temp(Src) 98.2 F (36.8 C) (Temporal)  Resp 18  Ht 5' 9.5" (1.765 m)  Wt 243 lb (110.224 kg)  BMI 35.38 kg/m2  General Appearance: Well nourished, in no apparent distress.  Eyes: PERRLA, EOMs, conjunctiva no swelling or erythema ENT/Mouth: Ear canals clear bilaterally with no erythema, swelling, discharge.  TMs normal bilaterally with no erythema, bulging, or retractions.  Oropharynx clear and moist with no exudate, swelling, or erythema.  Dentition normal.   Neck: Supple, thyroid normal. No bruits, JVD, cervical  adenopathy Respiratory: Respiratory effort normal, BS equal bilaterally without rales, rhonchi, wheezing or stridor.  Cardio: RRR without murmurs, rubs or gallops. Brisk peripheral pulses without edema.  Chest: symmetric, with normal excursions Abdomen: Soft, nontender, no guarding, rebound, hernias, masses, or organomegaly. Musculoskeletal: Full ROM all peripheral extremities,5/5 strength, and normal gait.  Skin: Warm, dry without rashes, lesions, ecchymosis. Neuro: A&Ox3, Cranial nerves intact, reflexes equal bilaterally. Normal muscle tone, no cerebellar symptoms. Sensation intact.  Psych: Normal affect, Insight and Judgment appropriate.   EKG: WNL no changes.  AORTA SCAN: WNL  Over 40 minutes of exam, counseling, chart review and critical decision making was performed  Loma Sousa Forcucci 3:21 PM [facility]

## 2015-08-10 NOTE — Patient Instructions (Signed)
Tamsulosin capsules  What is this medicine?  TAMSULOSIN (tam SOO loe sin) is used to treat enlargement of the prostate gland in men, a condition called benign prostatic hyperplasia or BPH. It is not for use in women. It works by relaxing muscles in the prostate and bladder neck. This improves urine flow and reduces BPH symptoms.  This medicine may be used for other purposes; ask your health care provider or pharmacist if you have questions.  What should I tell my health care provider before I take this medicine?  They need to know if you have any of the following conditions:  -advanced kidney disease  -advanced liver disease  -low blood pressure  -prostate cancer  -an unusual or allergic reaction to tamsulosin, sulfa drugs, other medicines, foods, dyes, or preservatives  -pregnant or trying to get pregnant  -breast-feeding  How should I use this medicine?  Take this medicine by mouth about 30 minutes after the same meal every day. Follow the directions on the prescription label. Swallow the capsules whole with a glass of water. Do not crush, chew, or open capsules. Do not take your medicine more often than directed. Do not stop taking your medicine unless your doctor tells you to.  Talk to your pediatrician regarding the use of this medicine in children. Special care may be needed.  Overdosage: If you think you have taken too much of this medicine contact a poison control center or emergency room at once.  NOTE: This medicine is only for you. Do not share this medicine with others.  What if I miss a dose?  If you miss a dose, take it as soon as you can. If it is almost time for your next dose, take only that dose. Do not take double or extra doses. If you stop taking your medicine for several days or more, ask your doctor or health care professional what dose you should start back on.  What may interact with this medicine?  -cimetidine  -fluoxetine  -ketoconazole  -medicines for erectile disfunction like sildenafil,  tadalafil, vardenafil  -medicines for high blood pressure  -other alpha-blockers like alfuzosin, doxazosin, phentolamine, phenoxybenzamine, prazosin, terazosin  -warfarin  This list may not describe all possible interactions. Give your health care provider a list of all the medicines, herbs, non-prescription drugs, or dietary supplements you use. Also tell them if you smoke, drink alcohol, or use illegal drugs. Some items may interact with your medicine.  What should I watch for while using this medicine?  Visit your doctor or health care professional for regular check ups. You will need lab work done before you start this medicine and regularly while you are taking it. Check your blood pressure as directed. Ask your health care professional what your blood pressure should be, and when you should contact him or her.  This medicine may make you feel dizzy or lightheaded. This is more likely to happen after the first dose, after an increase in dose, or during hot weather or exercise. Drinking alcohol and taking some medicines can make this worse. Do not drive, use machinery, or do anything that needs mental alertness until you know how this medicine affects you. Do not sit or stand up quickly. If you begin to feel dizzy, sit down until you feel better. These effects can decrease once your body adjusts to the medicine.  Contact your doctor or health care professional right away if you have an erection that lasts longer than 4 hours or if it   becomes painful. This may be a sign of a serious problem and must be treated right away to prevent permanent damage.  If you are thinking of having cataract surgery, tell your eye surgeon that you have taken this medicine.  What side effects may I notice from receiving this medicine?  Side effects that you should report to your doctor or health care professional as soon as possible:  -allergic reactions like skin rash or itching, hives, swelling of the lips, mouth, tongue, or  throat  -breathing problems  -change in vision  -feeling faint or lightheaded  -irregular heartbeat  -prolonged or painful erection  -weakness  Side effects that usually do not require medical attention (report to your doctor or health care professional if they continue or are bothersome):  -back pain  -change in sex drive or performance  -constipation, nausea or vomiting  -cough  -drowsy  -runny or stuffy nose  -trouble sleeping  This list may not describe all possible side effects. Call your doctor for medical advice about side effects. You may report side effects to FDA at 1-800-FDA-1088.  Where should I keep my medicine?  Keep out of the reach of children.  Store at room temperature between 15 and 30 degrees C (59 and 86 degrees F). Throw away any unused medicine after the expiration date.  NOTE: This sheet is a summary. It may not cover all possible information. If you have questions about this medicine, talk to your doctor, pharmacist, or health care provider.      2016, [employer]vier/[employer] Standard. (2012-03-21 14:11:34)

## 2015-08-11 LAB — URINALYSIS, ROUTINE W REFLEX MICROSCOPIC
BILIRUBIN URINE: NEGATIVE
HGB URINE DIPSTICK: NEGATIVE
Ketones, ur: NEGATIVE
LEUKOCYTES UA: NEGATIVE
Nitrite: NEGATIVE
PROTEIN: NEGATIVE
Specific Gravity, Urine: 1.031 (ref 1.001–1.035)
pH: 6 (ref 5.0–8.0)

## 2015-08-11 LAB — URINALYSIS, MICROSCOPIC ONLY
Bacteria, UA: NONE SEEN [HPF]
CASTS: NONE SEEN [LPF]
Crystals: NONE SEEN [HPF]
RBC / HPF: NONE SEEN RBC/HPF (ref <=2)
Squamous Epithelial / LPF: NONE SEEN [HPF] (ref <=5)
WBC, UA: NONE SEEN WBC/HPF (ref <=5)
YEAST: NONE SEEN [HPF]

## 2015-08-11 LAB — HEPATIC FUNCTION PANEL
ALK PHOS: 83 U/L (ref 40–115)
ALT: 76 U/L — ABNORMAL HIGH (ref 9–46)
AST: 42 U/L — ABNORMAL HIGH (ref 10–35)
Albumin: 4.8 g/dL (ref 3.6–5.1)
BILIRUBIN DIRECT: 0.1 mg/dL (ref <=0.2)
BILIRUBIN TOTAL: 0.7 mg/dL (ref 0.2–1.2)
Indirect Bilirubin: 0.6 mg/dL (ref 0.2–1.2)
TOTAL PROTEIN: 7.9 g/dL (ref 6.1–8.1)

## 2015-08-11 LAB — CBC WITH DIFFERENTIAL/PLATELET
BASOS PCT: 1 %
Basophils Absolute: 91 cells/uL (ref 0–200)
Eosinophils Absolute: 364 cells/uL (ref 15–500)
Eosinophils Relative: 4 %
HCT: 47.6 % (ref 38.5–50.0)
HEMOGLOBIN: 17.2 g/dL — AB (ref 13.2–17.1)
LYMPHS PCT: 32 %
Lymphs Abs: 2912 cells/uL (ref 850–3900)
MCH: 33.3 pg — ABNORMAL HIGH (ref 27.0–33.0)
MCHC: 36.1 g/dL — ABNORMAL HIGH (ref 32.0–36.0)
MCV: 92.2 fL (ref 80.0–100.0)
MPV: 10.2 fL (ref 7.5–12.5)
Monocytes Absolute: 637 cells/uL (ref 200–950)
Monocytes Relative: 7 %
NEUTROS ABS: 5096 {cells}/uL (ref 1500–7800)
Neutrophils Relative %: 56 %
Platelets: 229 10*3/uL (ref 140–400)
RBC: 5.16 MIL/uL (ref 4.20–5.80)
RDW: 13.6 % (ref 11.0–15.0)
WBC: 9.1 10*3/uL (ref 3.8–10.8)

## 2015-08-11 LAB — IRON AND TIBC
%SAT: 29 % (ref 15–60)
IRON: 105 ug/dL (ref 50–180)
TIBC: 362 ug/dL (ref 250–425)
UIBC: 257 ug/dL (ref 125–400)

## 2015-08-11 LAB — BASIC METABOLIC PANEL WITH GFR
BUN: 22 mg/dL (ref 7–25)
CHLORIDE: 101 mmol/L (ref 98–110)
CO2: 22 mmol/L (ref 20–31)
Calcium: 9.9 mg/dL (ref 8.6–10.3)
Creat: 0.93 mg/dL (ref 0.70–1.33)
GFR, Est Non African American: >89 mL/min (ref >=60)
Glucose, Bld: 110 mg/dL — ABNORMAL HIGH (ref 65–99)
POTASSIUM: 3.7 mmol/L (ref 3.5–5.3)
Sodium: 138 mmol/L (ref 135–146)

## 2015-08-11 LAB — MICROALBUMIN / CREATININE URINE RATIO
CREATININE, URINE: 70 mg/dL (ref 20–370)
Microalb Creat Ratio: 7 mcg/mg creat (ref <30)
Microalb, Ur: 0.5 mg/dL

## 2015-08-11 LAB — PSA: PSA: 0.99 ng/mL (ref <=4.00)

## 2015-08-11 LAB — VITAMIN B12: VITAMIN B 12: 531 pg/mL (ref 200–1100)

## 2015-08-11 LAB — TESTOSTERONE: Testosterone: 323 ng/dL (ref 250–827)

## 2015-08-11 LAB — VITAMIN D 25 HYDROXY (VIT D DEFICIENCY, FRACTURES): VIT D 25 HYDROXY: 20 ng/mL — AB (ref 30–100)

## 2015-08-11 LAB — LIPID PANEL
CHOLESTEROL: 225 mg/dL — AB (ref 125–200)
HDL: 39 mg/dL — ABNORMAL LOW (ref >=40)
LDL CALC: 130 mg/dL — AB (ref <130)
Total CHOL/HDL Ratio: 5.8 Ratio — ABNORMAL HIGH (ref <=5.0)
Triglycerides: 281 mg/dL — ABNORMAL HIGH (ref <150)
VLDL: 56 mg/dL — ABNORMAL HIGH (ref <30)

## 2015-08-11 LAB — MAGNESIUM: Magnesium: 2.2 mg/dL (ref 1.5–2.5)

## 2015-08-11 LAB — TSH: TSH: 5.03 mIU/L — ABNORMAL HIGH (ref 0.40–4.50)

## 2015-09-09 ENCOUNTER — Other Ambulatory Visit: Payer: Self-pay | Admitting: Internal Medicine

## 2015-09-09 ENCOUNTER — Other Ambulatory Visit: Payer: Self-pay | Admitting: Physician Assistant

## 2015-09-10 ENCOUNTER — Ambulatory Visit (AMBULATORY_SURGERY_CENTER): Payer: Self-pay

## 2015-09-10 VITALS — Ht 70.0 in | Wt 244.0 lb

## 2015-09-10 DIAGNOSIS — Z1211 Encounter for screening for malignant neoplasm of colon: Secondary | ICD-10-CM

## 2015-09-10 MED ORDER — NA SULFATE-K SULFATE-MG SULF 17.5-3.13-1.6 GM/177ML PO SOLN: 1.0000 | Freq: Once | ORAL | Status: DC

## 2015-09-10 NOTE — Progress Notes (Signed)
No egg or soy allergy.  Hx of post op N/V x2. No other previous complications from anesthesia. No home O2. No diet meds.

## 2015-09-24 ENCOUNTER — Encounter: Payer: Self-pay | Admitting: Gastroenterology

## 2015-09-24 ENCOUNTER — Ambulatory Visit (AMBULATORY_SURGERY_CENTER): Payer: BLUE CROSS/BLUE SHIELD | Admitting: Gastroenterology

## 2015-09-24 VITALS — BP 111/81 | HR 65 | Temp 97.8°F | Resp 12 | Ht 69.5 in | Wt 243.0 lb

## 2015-09-24 DIAGNOSIS — Z1211 Encounter for screening for malignant neoplasm of colon: Secondary | ICD-10-CM | POA: Diagnosis not present

## 2015-09-24 DIAGNOSIS — D123 Benign neoplasm of transverse colon: Secondary | ICD-10-CM | POA: Diagnosis not present

## 2015-09-24 DIAGNOSIS — D124 Benign neoplasm of descending colon: Secondary | ICD-10-CM

## 2015-09-24 LAB — GLUCOSE, CAPILLARY
GLUCOSE-CAPILLARY: 165 mg/dL — AB (ref 65–99)
Glucose-Capillary: 219 mg/dL — ABNORMAL HIGH (ref 65–99)

## 2015-09-24 MED ORDER — SODIUM CHLORIDE 0.9 % IV SOLN: 500.0000 mL | INTRAVENOUS | Status: DC

## 2015-09-24 NOTE — Patient Instructions (Signed)
YOU HAD AN ENDOSCOPIC PROCEDURE TODAY AT [facility]:   Refer to the procedure report that was given to you for any specific questions about what was found during the examination.  If the procedure report does not answer your questions, please call your gastroenterologist to clarify.  If you requested that your care partner not be given the details of your procedure findings, then the procedure report has been included in a sealed envelope for you to review at your convenience later.  YOU SHOULD EXPECT: Some feelings of bloating in the abdomen. Passage of more gas than usual.  Walking can help get rid of the air that was put into your GI tract during the procedure and reduce the bloating. If you had a lower endoscopy (such as a colonoscopy or flexible sigmoidoscopy) you may notice spotting of blood in your stool or on the toilet paper. If you underwent a bowel prep for your procedure, you may not have a normal bowel movement for a few days.  Please Note:  You might notice some irritation and congestion in your nose or some drainage.  This is from the oxygen used during your procedure.  There is no need for concern and it should clear up in a day or so.  SYMPTOMS TO REPORT IMMEDIATELY:   Following lower endoscopy (colonoscopy or flexible sigmoidoscopy):  Excessive amounts of blood in the stool  Significant tenderness or worsening of abdominal pains  Swelling of the abdomen that is new, acute  Fever of 100F or higher   For urgent or emergent issues, a gastroenterologist can be reached at any hour by calling [phone].   DIET: Your first meal following the procedure should be a small meal and then it is ok to progress to your normal diet. Heavy or fried foods are harder to digest and may make you feel nauseous or bloated.  Likewise, meals heavy in dairy and vegetables can increase bloating.  Drink plenty of fluids but you should avoid alcoholic beverages for 24  hours.  ACTIVITY:  You should plan to take it easy for the rest of today and you should NOT DRIVE or use heavy machinery until tomorrow (because of the sedation medicines used during the test).    FOLLOW UP: Our staff will call the number listed on your records the next business day following your procedure to check on you and address any questions or concerns that you may have regarding the information given to you following your procedure. If we do not reach you, we will leave a message.  However, if you are feeling well and you are not experiencing any problems, there is no need to return our call.  We will assume that you have returned to your regular daily activities without incident.  If any biopsies were taken you will be contacted by phone or by letter within the next 1-3 weeks.  Please call us at [phone] if you have not heard about the biopsies in 3 weeks.    SIGNATURES/CONFIDENTIALITY: You and/or your care partner have signed paperwork which will be entered into your electronic medical record.  These signatures attest to the fact that that the information above on your After Visit Summary has been reviewed and is understood.  Full responsibility of the confidentiality of this discharge information lies with you and/or your care-partner.   Resume medications. Information given on polyps, Diverticulosis and high fiber diet.

## 2015-09-24 NOTE — Op Note (Addendum)
[name]: [name] [name] Procedure Date: 09/24/2015 9:39 AM MRN: [id number] Endoscopist: Ladene Artist , MD Age: 50 Referring MD:  Date of Birth: 12-24-1965 Gender: Male Account #: [id number] Procedure:                Colonoscopy Indications:              Screening for colorectal malignant neoplasm, This                            is the patient's first colonoscopy Medicines:                Monitored Anesthesia Care Procedure:                Pre-Anesthesia Assessment:                           - Prior to the procedure, a History and Physical                            was performed, and patient medications and                            allergies were reviewed. The patient's tolerance of                            previous anesthesia was also reviewed. The risks                            and benefits of the procedure and the sedation                            options and risks were discussed with the patient.                            All questions were answered, and informed consent                            was obtained. Prior Anticoagulants: The patient has                            taken no previous anticoagulant or antiplatelet                            agents. ASA Grade Assessment: II - A patient with                            mild systemic disease. After reviewing the risks                            and benefits, the patient was deemed in                            satisfactory condition to undergo the procedure.  After obtaining informed consent, the colonoscope                            was passed under direct vision. Throughout the                            procedure, the patient's blood pressure, pulse, and                            oxygen saturations were monitored continuously. The                            Model PCF-H190L [id number]) scope was introduced                            through the anus and  advanced to the the cecum,                            identified by appendiceal orifice and ileocecal                            valve. The colonoscopy was performed without                            difficulty. The patient tolerated the procedure                            well. The quality of the bowel preparation was                            excellent. The ileocecal valve, appendiceal                            orifice, and rectum were photographed. Scope In: 9:48:40 AM Scope Out: 10:02:10 AM Scope Withdrawal Time: 0 hours 11 minutes 51 seconds  Total Procedure Duration: 0 hours 13 minutes 30 seconds  Findings:                 The digital rectal exam was normal.                           Four sessile polyps were found in the descending                            colon (1) and transverse colon (3). The polyps were                            5 to 7 mm in size. These polyps were removed with a                            cold snare. Resection and retrieval were complete.                           A few small-mouthed diverticula were  found in the                            sigmoid and descending colon. There was no evidence                            of diverticular bleeding.                           The exam was otherwise without abnormality on                            direct and retroflexion views. Complications:            No immediate complications. Estimated Blood Loss:     Estimated blood loss: none. Impression:               - Four 5 to 7 mm polyps in the descending colon and                            in the transverse colon, removed with a cold snare.                            Resected and retrieved.                           - Mild diverticulosis in the sigmoid and descending                            colon.                           - The examination was otherwise normal on direct                            and retroflexion views. Recommendation:           - Patient has  a contact number available for                            emergencies. The signs and symptoms of potential                            delayed complications were discussed with the                            patient. Return to normal activities tomorrow.                            Written discharge instructions were provided to the                            patient.                           - Resume previous diet.                           -  Continue present medications.                           - Await pathology results.                           - If the pathology report reveals 3-4 precancerous                            polyps, then repeat the colonoscopy in 3 years. If                            1-2 precancerous colonoscopy in 5 years Ladene Artist, MD 09/24/2015 10:10:07 AM This report has been signed electronically.

## 2015-09-24 NOTE — Progress Notes (Signed)
Called to room to assist during endoscopic procedure.  Patient ID and intended procedure confirmed with present staff. Received instructions for my participation in the procedure from the performing physician.  

## 2015-09-24 NOTE — Progress Notes (Signed)
Report to PACU, RN, vss, BBS= Clear.  

## 2015-09-25 ENCOUNTER — Telehealth: Payer: Self-pay | Admitting: *Deleted

## 2015-09-25 NOTE — Telephone Encounter (Signed)
No answer, left message to call if questions or concerns. 

## 2015-09-29 ENCOUNTER — Encounter: Payer: Self-pay | Admitting: Gastroenterology

## 2015-09-30 ENCOUNTER — Other Ambulatory Visit: Payer: Self-pay | Admitting: Internal Medicine

## 2015-10-09 ENCOUNTER — Encounter: Payer: Self-pay | Admitting: Internal Medicine

## 2015-10-09 ENCOUNTER — Ambulatory Visit (INDEPENDENT_AMBULATORY_CARE_PROVIDER_SITE_OTHER): Payer: BLUE CROSS/BLUE SHIELD | Admitting: Internal Medicine

## 2015-10-09 VITALS — BP 118/70 | HR 84 | Temp 97.8°F | Resp 18 | Ht 69.5 in | Wt 246.0 lb

## 2015-10-09 DIAGNOSIS — R42 Dizziness and giddiness: Secondary | ICD-10-CM

## 2015-10-09 DIAGNOSIS — R197 Diarrhea, unspecified: Secondary | ICD-10-CM

## 2015-10-09 MED ORDER — HYOSCYAMINE SULFATE 0.125 MG SL SUBL: SUBLINGUAL_TABLET | SUBLINGUAL | Status: DC

## 2015-10-09 NOTE — Progress Notes (Signed)
Subjective:    Patient ID: [name] [name], male    DOB: 1966/03/23, 50 y.o.   MRN: [id number]  HPI  Patient presents to the office for evaluation of diarrhea, abdominal cramping, and some dizziness.  He reports that this has been going on for approximately 1 week.  He reports that the stool has been loose and powdery in appearance.  He reports that there is no blood in his stool.  He is still having 2 stools per day.  At the worse he was having 6 stools per day.  He reports that this started after he ate some spicy foods.  He reports that he has been having some changes in his blood pressure readings.  He reports that he gets worsening dizziness when he drinks.  He reports that he does work outside a lot.  Blood sugars are running 170-180.  He reports that he has been checking his blood pressure.  He reports that sometimes his blood pressure is 69/43.  He reports that he is not sure his cuff is working.  He reports that the dizziness will last only for a few seconds.  He feels fine once he sits back down he does okay.  He reports that he is due to have a job change coming up.  He reports that he is drinking water, diet free mountain dew or sugar free lemonade or beer.  He is drinking 4 beers per day.  He is thinking he gets in 100 oz of fluid per day.     .Review of Systems  Constitutional: Negative for fever, chills and fatigue.  Respiratory: Negative for cough, chest tightness, shortness of breath and wheezing.   Cardiovascular: Negative for chest pain and palpitations.  Gastrointestinal: Positive for diarrhea. Negative for nausea, vomiting, abdominal pain, constipation, blood in stool and anal bleeding.  Genitourinary: Negative.   Musculoskeletal: Negative.   Neurological: Positive for dizziness. Negative for light-headedness, numbness and headaches.       Objective:   Physical Exam  Constitutional: He is oriented to person, place, and time. He appears well-developed and  well-nourished. No distress.  HENT:  Mouth/Throat: No oropharyngeal exudate.  Eyes: Conjunctivae are normal. No scleral icterus.  Neck: Normal range of motion. Neck supple. No JVD present. No thyromegaly present.  Cardiovascular: Normal rate, regular rhythm, normal heart sounds and intact distal pulses.  Exam reveals no gallop and no friction rub.   No murmur heard. Pulmonary/Chest: Effort normal and breath sounds normal. No respiratory distress. He has no wheezes. He has no rales. He exhibits no tenderness.  Abdominal: Soft. Bowel sounds are normal. He exhibits no distension and no mass. There is no tenderness. There is no rebound and no guarding.  Musculoskeletal: Normal range of motion.  Lymphadenopathy:    He has no cervical adenopathy.  Neurological: He is alert and oriented to person, place, and time.  Skin: Skin is warm and dry. He is not diaphoretic.  Psychiatric: He has a normal mood and affect. His behavior is normal. Judgment and thought content normal.  Nursing note and vitals reviewed.   Filed Vitals:   10/09/15 0945  BP: 118/70  Pulse: 84  Temp: 97.8 F (36.6 C)  Resp: 18          Assessment & Plan:    1. Diarrhea, unspecified type -doubt that this is diverticulitis -levsin -appears to be improving with time -avoid abx currently   2. Dizziness and giddiness -dehydration from diarrhea and hypotension -cut hyzar in  half -avoid alcohol

## 2015-10-09 NOTE — Patient Instructions (Signed)
Please take hyoscamine up to 4 times a day as needed to help with diarrhea and abdominal cramping.  It will dissolve in your mouth.  It takes 5-10 minutes to work.  Please drink plenty of water.  Your goal 5-6 water bottles of water.    Please cut your blood pressure medication in half and monitor your blood pressure at home.  Please send me a message in a week and let me know what your blood pressures are doing.  Please call if you develop severe abdominal pain, nausea or intractable vomiting go to the ER.

## 2015-10-29 ENCOUNTER — Other Ambulatory Visit: Payer: Self-pay | Admitting: Internal Medicine

## 2015-12-19 ENCOUNTER — Other Ambulatory Visit: Payer: Self-pay | Admitting: Internal Medicine

## 2016-02-10 ENCOUNTER — Ambulatory Visit: Payer: Self-pay | Admitting: Internal Medicine

## 2016-02-15 ENCOUNTER — Ambulatory Visit (INDEPENDENT_AMBULATORY_CARE_PROVIDER_SITE_OTHER): Payer: Self-pay | Admitting: Orthopedic Surgery

## 2016-02-29 ENCOUNTER — Ambulatory Visit: Payer: Self-pay | Admitting: Internal Medicine

## 2016-03-17 ENCOUNTER — Ambulatory Visit: Payer: Self-pay | Admitting: Internal Medicine

## 2016-03-22 ENCOUNTER — Ambulatory Visit: Payer: Self-pay | Admitting: Internal Medicine

## 2016-04-13 ENCOUNTER — Ambulatory Visit: Payer: Self-pay | Admitting: Internal Medicine

## 2016-04-18 ENCOUNTER — Encounter: Payer: Self-pay | Admitting: Internal Medicine

## 2016-04-18 ENCOUNTER — Ambulatory Visit (INDEPENDENT_AMBULATORY_CARE_PROVIDER_SITE_OTHER): Payer: Managed Care, Other (non HMO) | Admitting: Internal Medicine

## 2016-04-18 VITALS — BP 132/90 | HR 98 | Temp 97.5°F | Resp 16 | Ht 69.5 in | Wt 236.0 lb

## 2016-04-18 DIAGNOSIS — Z794 Long term (current) use of insulin: Secondary | ICD-10-CM | POA: Diagnosis not present

## 2016-04-18 DIAGNOSIS — E782 Mixed hyperlipidemia: Secondary | ICD-10-CM

## 2016-04-18 DIAGNOSIS — I10 Essential (primary) hypertension: Secondary | ICD-10-CM | POA: Diagnosis not present

## 2016-04-18 DIAGNOSIS — R42 Dizziness and giddiness: Secondary | ICD-10-CM | POA: Diagnosis not present

## 2016-04-18 DIAGNOSIS — E119 Type 2 diabetes mellitus without complications: Secondary | ICD-10-CM | POA: Diagnosis not present

## 2016-04-18 DIAGNOSIS — Z79899 Other long term (current) drug therapy: Secondary | ICD-10-CM | POA: Diagnosis not present

## 2016-04-18 DIAGNOSIS — H5500 Unspecified nystagmus: Secondary | ICD-10-CM

## 2016-04-18 LAB — HEPATIC FUNCTION PANEL
ALBUMIN: 4.6 g/dL (ref 3.6–5.1)
ALT: 52 U/L — ABNORMAL HIGH (ref 9–46)
AST: 33 U/L (ref 10–35)
Alkaline Phosphatase: 82 U/L (ref 40–115)
Bilirubin, Direct: 0.2 mg/dL (ref <=0.2)
Indirect Bilirubin: 0.6 mg/dL (ref 0.2–1.2)
TOTAL PROTEIN: 8.1 g/dL (ref 6.1–8.1)
Total Bilirubin: 0.8 mg/dL (ref 0.2–1.2)

## 2016-04-18 LAB — LIPID PANEL
Cholesterol: 182 mg/dL (ref <200)
HDL: 47 mg/dL (ref >40)
LDL Cholesterol: 107 mg/dL — ABNORMAL HIGH (ref <100)
TRIGLYCERIDES: 140 mg/dL (ref <150)
Total CHOL/HDL Ratio: 3.9 Ratio (ref <5.0)
VLDL: 28 mg/dL (ref <30)

## 2016-04-18 LAB — CBC WITH DIFFERENTIAL/PLATELET
Basophils Absolute: 72 cells/uL (ref 0–200)
Basophils Relative: 1 %
Eosinophils Absolute: 288 cells/uL (ref 15–500)
Eosinophils Relative: 4 %
HEMATOCRIT: 46.7 % (ref 38.5–50.0)
Hemoglobin: 16.3 g/dL (ref 13.2–17.1)
LYMPHS PCT: 36 %
Lymphs Abs: 2592 cells/uL (ref 850–3900)
MCH: 32.4 pg (ref 27.0–33.0)
MCHC: 34.9 g/dL (ref 32.0–36.0)
MCV: 92.8 fL (ref 80.0–100.0)
MONO ABS: 504 {cells}/uL (ref 200–950)
MONOS PCT: 7 %
MPV: 10.5 fL (ref 7.5–12.5)
NEUTROS PCT: 52 %
Neutro Abs: 3744 cells/uL (ref 1500–7800)
PLATELETS: 225 10*3/uL (ref 140–400)
RBC: 5.03 MIL/uL (ref 4.20–5.80)
RDW: 13.5 % (ref 11.0–15.0)
WBC: 7.2 10*3/uL (ref 3.8–10.8)

## 2016-04-18 LAB — BASIC METABOLIC PANEL WITH GFR
BUN: 21 mg/dL (ref 7–25)
CALCIUM: 9.9 mg/dL (ref 8.6–10.3)
CO2: 25 mmol/L (ref 20–31)
Chloride: 101 mmol/L (ref 98–110)
Creat: 0.91 mg/dL (ref 0.70–1.33)
GLUCOSE: 170 mg/dL — AB (ref 65–99)
Potassium: 4.3 mmol/L (ref 3.5–5.3)
Sodium: 139 mmol/L (ref 135–146)

## 2016-04-18 LAB — TSH: TSH: 2.52 mIU/L (ref 0.40–4.50)

## 2016-04-18 LAB — HEMOGLOBIN A1C
Hgb A1c MFr Bld: 8.6 % — ABNORMAL HIGH (ref <5.7)
Mean Plasma Glucose: 200 mg/dL

## 2016-04-18 LAB — VITAMIN B12: Vitamin B-12: 489 pg/mL (ref 200–1100)

## 2016-04-18 MED ORDER — MECLIZINE HCL 25 MG PO TABS: 25.0000 mg | ORAL_TABLET | Freq: Three times a day (TID) | ORAL | 1 refills | Status: DC | PRN

## 2016-04-18 MED ORDER — FLUTICASONE PROPIONATE 50 MCG/ACT NA SUSP: 2.0000 | Freq: Every day | NASAL | 0 refills | Status: DC

## 2016-04-18 NOTE — Progress Notes (Signed)
Assessment and Plan:  Hypertension:  -mildly elevated today -Continue medication -monitor blood pressure at home. -Continue DASH diet -Reminder to go to the ER if any CP, SOB, nausea, dizziness, severe HA, changes vision/speech, left arm numbness and tingling and jaw pain.  Cholesterol - Continue diet and exercise -Check cholesterol.  -cont medication  Diabetes with diabetic chronic kidney disease -Continue diet and exercise.  -followed by endocrinology -will defer treatment to them -possible that uncontrolled diabetes is playing a roll in vertigo -high risk for stroke may consider getting MRI brain -Check A1C  Vitamin D Def -check level -continue medications.   Dizziness -could be related to orthostasis given that the patient is engaging in both alcohol and caffeine while on invokana -have advised stopping alcohol and caffeine -meclizine for dizziness -definite nystagmus on exam but could be related to substance abuse rather than definite brain lesion in the cerebellar region -will check urine for ketones and high specific gravity for signs of dehydration -will get BMET for electrolyte derangement -Thiamine and B12 due to recent alcohol use   Continue diet and meds as discussed. Further disposition pending results of labs. Discussed med's effects and SE's.    HPI 51 y.o. male  presents for 3 month follow up with hypertension, hyperlipidemia, diabetes and vitamin D deficiency.   His blood pressure has been controlled at home, today their BP is BP: 132/90.He does not workout. He denies chest pain, shortness of breath, dizziness.  He reports that when he is dizzy he is getting readings of 95/65.    He is on cholesterol medication and denies myalgias. His cholesterol is at goal. The cholesterol was:  08/10/2015: Cholesterol 225; HDL 39; LDL Cholesterol 130; Triglycerides 281   He has not been working on diet and exercise for diabetes with diabetic chronic kidney disease, he is  not on bASA, he is on ACE/ARB, and denies  foot ulcerations, hyperglycemia, hypoglycemia , increased appetite, nausea, paresthesia of the feet, polydipsia, polyuria, visual disturbances, vomiting and weight loss. Last A1C was: No results found for requested labs within last 8760 hours.   Patient is on Vitamin D supplement. 08/10/2015: Vit D, 25-Hydroxy 20   Patient reports that he has been having some issues with dizziness.  It is intermittent and is worsened by his rapid motions.  He notes that it is worsening over the last couple months.  He reports that he has tried cutting his BP medication in half.  He reports that he has had some worsening issues with congestion for the last week.  He reports that he is getting a vertigo like sensation.  He feels like he is the one that is spinning.  He has never had issues with vertigo in the past.  He does not have nausea or vomiting with it.  Generally it will go away if he sits stills.  It has not woken him up from sleep.  He has no history of vertigo in the past.    He reports that he is drinking a lot of beer recently.  He is generally having 6 beers per day.  He does drink despite his wife getting angry with him.  He reports that he is able to still work.  If he does not drink he does get nausea in the afternoon.  He reports that he is influenced by the stress of the job he has.     Current Medications:  Current Outpatient Prescriptions on File Prior to Visit  Medication Sig Dispense Refill  .  aspirin 81 MG tablet Take 81 mg by mouth daily.      Marland Kitchen atorvastatin (LIPITOR) 40 MG tablet Take 40 mg by mouth daily.      . Canagliflozin (INVOKANA) 300 MG TABS Take 300 mg by mouth daily.    . Cholecalciferol (VITAMIN D3) 3000 units TABS Take 4,000 Int'l Units by mouth.    . citalopram (CELEXA) 40 MG tablet TAKE 1 TABLET ONCE DAILY. 90 tablet 0  . Dulaglutide (TRULICITY [place]) Inject into the skin once a week. Reported on 09/10/2015    . glucose blood (ACCU-CHEK  COMPACT STRIPS) test strip Use as instructed     . insulin glargine (LANTUS) 100 UNIT/ML injection Inject 60 Units into the skin 2 (two) times daily.     . insulin lispro (HUMALOG KWIKPEN) 100 UNIT/ML injection Inject 50 Units into the skin 3 (three) times daily with meals. 50 units not exceeding 200 units a day    . levothyroxine (SYNTHROID, LEVOTHROID) 137 MCG tablet Take 137 mcg by mouth daily.      Marland Kitchen losartan-hydrochlorothiazide (HYZAAR) 100-25 MG per tablet Take 1 tablet by mouth daily. 90 tablet 1  . meloxicam (MOBIC) 15 MG tablet Once daily with food for pain 30 tablet 3  . MILK THISTLE PO Take 280 mg by mouth.    Marland Kitchen omeprazole (PRILOSEC) 20 MG capsule TAKE (1) CAPSULE DAILY. 90 capsule 0  . tamsulosin (FLOMAX) 0.4 MG CAPS capsule TAKE 1 CAPSULE DAILY AFTER SUPPER. 90 capsule 0   No current facility-administered medications on file prior to visit.    Medical History:  Past Medical History:  Diagnosis Date  . Anxiety   . ASTHMA 10/28/2006  . Cough   . Diabetes mellitus type II   . GERD (gastroesophageal reflux disease)   . Hyperlipidemia   . Hypertension    DR TODD PCP,PT HAS NOT SEEN >3 YRS  . Hypothyroidism   . Lumbar back pain   . NASH (nonalcoholic steatohepatitis)   . Numbness   . OSA (obstructive sleep apnea) 11/19/2010  . Sleep apnea    Pt states mild case, doesn't use cpap   Allergies: No Known Allergies   Review of Systems:  Review of Systems  Constitutional: Positive for weight loss. Negative for chills, diaphoresis, fever and malaise/fatigue.  HENT: Positive for congestion. Negative for ear pain, hearing loss, sinus pain, sore throat and tinnitus.   Eyes: Negative.   Respiratory: Negative for cough, shortness of breath and wheezing.   Cardiovascular: Negative for chest pain, palpitations and leg swelling.  Gastrointestinal: Negative for blood in stool, constipation, diarrhea, heartburn, melena, nausea and vomiting.  Genitourinary: Positive for urgency.   Neurological: Positive for dizziness. Negative for tremors, focal weakness and headaches.  Psychiatric/Behavioral: Positive for depression and substance abuse. The patient has insomnia. The patient is not nervous/anxious.     Family history- Review and unchanged  Social history- Review and unchanged  Physical Exam: BP 132/90   Pulse 98   Temp 97.5 F (36.4 C)   Resp 16   Ht 5' 9.5" (1.765 m)   Wt 236 lb (107 kg)   SpO2 97%   BMI 34.35 kg/m  Wt Readings from Last 3 Encounters:  04/18/16 236 lb (107 kg)  10/09/15 246 lb (111.6 kg)  09/24/15 243 lb (110.2 kg)   General Appearance: Well nourished well developed, non-toxic appearing, in no apparent distress. Eyes: PERRLA, horizontal nystagmus with a rotational component, No conjunctival swelling or erythema ENT/Mouth: Ear canals clear with no  erythema, swelling, or discharge.  TMs normal bilaterally, oropharynx clear, moist, with no exudate.   Neck: Supple, thyroid normal, no JVD, no cervical adenopathy.  Respiratory: Respiratory effort normal, breath sounds clear A&P, no wheeze, rhonchi or rales noted.  No retractions, no accessory muscle usage Cardio: RRR with no MRGs. No noted edema.  Abdomen: Soft, + BS.  Non tender, no guarding, rebound, hernias, masses. Musculoskeletal: Full ROM, 5/5 strength, Normal gait Skin: Warm, dry without rashes, lesions, ecchymosis.  Neuro: Awake and oriented X 3, Cranial nerves intact. No cerebellar symptoms. Normal rapid alternating hand movements, normal finger to nose, normal heel to shin.  Normal gait, normal strength.  Psych: normal affect, Insight and Judgment appropriate.    Starlyn Skeans, PA-C 11:11 AM [facility]

## 2016-04-18 NOTE — Patient Instructions (Signed)
Please take meclizine every 8 hours as needed for vertigo.  Please use 2 sprays of flonase per each nostril.  Please take claritin, zyrtec, or allegra daily to help dry up any further congestion.  Please avoid both caffeine and alcohol and make sure you are drinking plenty of water.  I believe that you may be getting dehydrated because you are combining invokana which is dehydrating with other substances like caffeine and alcohol which causes you to also get rid of fluid.  Please call the office at the end of the week if you are not having any improvement in the dizziness,    Please pull over immediately if you start getting dizzy.

## 2016-04-19 LAB — URINALYSIS, ROUTINE W REFLEX MICROSCOPIC
Bilirubin Urine: NEGATIVE
Hgb urine dipstick: NEGATIVE
Ketones, ur: NEGATIVE
LEUKOCYTES UA: NEGATIVE
NITRITE: NEGATIVE
Protein, ur: NEGATIVE
SPECIFIC GRAVITY, URINE: 1.036 — AB (ref 1.001–1.035)
pH: 6 (ref 5.0–8.0)

## 2016-04-19 LAB — URINALYSIS, MICROSCOPIC ONLY
BACTERIA UA: NONE SEEN [HPF]
Casts: NONE SEEN [LPF]
Crystals: NONE SEEN [HPF]
RBC / HPF: NONE SEEN RBC/HPF (ref <=2)
Squamous Epithelial / LPF: NONE SEEN [HPF] (ref <=5)
WBC UA: NONE SEEN WBC/HPF (ref <=5)
Yeast: NONE SEEN [HPF]

## 2016-04-23 LAB — VITAMIN B1: VITAMIN B1 (THIAMINE): 9 nmol/L (ref 8–30)

## 2016-05-07 ENCOUNTER — Other Ambulatory Visit: Payer: Self-pay | Admitting: Internal Medicine

## 2016-05-29 ENCOUNTER — Other Ambulatory Visit: Payer: Self-pay | Admitting: Internal Medicine

## 2016-06-21 ENCOUNTER — Other Ambulatory Visit: Payer: Self-pay | Admitting: Internal Medicine

## 2016-06-21 ENCOUNTER — Other Ambulatory Visit: Payer: Self-pay | Admitting: Physician Assistant

## 2016-06-22 ENCOUNTER — Ambulatory Visit (INDEPENDENT_AMBULATORY_CARE_PROVIDER_SITE_OTHER): Payer: Managed Care, Other (non HMO) | Admitting: Physician Assistant

## 2016-06-22 ENCOUNTER — Encounter: Payer: Self-pay | Admitting: Physician Assistant

## 2016-06-22 VITALS — BP 136/80 | HR 68 | Temp 97.0°F | Resp 16 | Ht 69.5 in | Wt 242.0 lb

## 2016-06-22 DIAGNOSIS — R0989 Other specified symptoms and signs involving the circulatory and respiratory systems: Secondary | ICD-10-CM

## 2016-06-22 DIAGNOSIS — R21 Rash and other nonspecific skin eruption: Secondary | ICD-10-CM

## 2016-06-22 DIAGNOSIS — Z794 Long term (current) use of insulin: Secondary | ICD-10-CM | POA: Diagnosis not present

## 2016-06-22 DIAGNOSIS — E119 Type 2 diabetes mellitus without complications: Secondary | ICD-10-CM

## 2016-06-22 MED ORDER — TRIAMCINOLONE ACETONIDE 0.5 % EX CREA: 1.0000 "application " | TOPICAL_CREAM | Freq: Two times a day (BID) | CUTANEOUS | 2 refills | Status: DC

## 2016-06-22 NOTE — Patient Instructions (Signed)
Rash A rash is a change in the color of the skin. A rash can also change the way your skin feels. There are many different conditions and factors that can cause a rash. Follow these instructions at home: Pay attention to any changes in your symptoms. Follow these instructions to help with your condition: Medicine Take or apply over-the-counter and prescription medicines only as told by your health care provider. These may include:  Corticosteroid cream.  Anti-itch lotions.  Oral antihistamines.  Skin Care  Apply cool compresses to the affected areas.  Try taking a bath with: ? Epsom salts. Follow the instructions on the packaging. You can get these at your local pharmacy or grocery store. ? Baking soda. Pour a small amount into the bath as told by your health care provider. ? Colloidal oatmeal. Follow the instructions on the packaging. You can get this at your local pharmacy or grocery store.  Try applying baking soda paste to your skin. Stir water into baking soda until it reaches a paste-like consistency.  Do not scratch or rub your skin.  Avoid covering the rash. Make sure the rash is exposed to air as much as possible. General instructions  Avoid hot showers or baths, which can make itching worse. A cold shower may help.  Avoid scented soaps, detergents, and perfumes. Use gentle soaps, detergents, perfumes, and other cosmetic products.  Avoid any substance that causes your rash. Keep a journal to help track what causes your rash. Write down: ? What you eat. ? What cosmetic products you use. ? What you drink. ? What you wear. This includes jewelry.  Keep all follow-up visits as told by your health care provider. This is important. Contact a health care provider if:  You sweat at night.  You lose weight.  You urinate more than normal.  You feel weak.  You vomit.  Your skin or the whites of your eyes look yellow (jaundice).  Your skin: ? Tingles. ? Is  numb.  Your rash: ? Does not go away after several days. ? Gets worse.  You are: ? Unusually thirsty. ? More tired than normal.  You have: ? New symptoms. ? Pain in your abdomen. ? A fever. ? Diarrhea. Get help right away if:  You develop a rash that covers all or most of your body. The rash may or may not be painful.  You develop blisters that: ? Are on top of the rash. ? Grow larger or grow together. ? Are painful. ? Are inside your nose or mouth.  You develop a rash that: ? Looks like purple pinprick-sized spots all over your body. ? Has a "bull's eye" or looks like a target. ? Is not related to sun exposure, is red and painful, and causes your skin to peel. This information is not intended to replace advice given to you by your health care provider. Make sure you discuss any questions you have with your health care provider. Document Released: 03/11/2002 Document Revised: 08/25/2015 Document Reviewed: 08/06/2014 [employer] Interactive Patient Education  2017 [employer]  

## 2016-06-22 NOTE — Progress Notes (Signed)
Subjective:    Patient ID: [name] [name], male    DOB: June 13, 1965, 51 y.o.   MRN: [id number]  HPI 51 y.o. WM with history of insulin dependent DM x 2000 presents with left leg pain and rash.  Has had lower leg rash intermittent x 4-5 months left medial anterior leg. Then for 1-2 weeks has has dull ache in left large toe and in his ankle. Has some burning pain bilateral feet, worse left leg. No claudication.   Blood pressure 136/80, pulse 68, temperature 97 F (36.1 C), resp. rate 16, height 5' 9.5" (1.765 m), weight 242 lb (109.8 kg), SpO2 95 %.  Medications Current Outpatient Prescriptions on File Prior to Visit  Medication Sig  . aspirin 81 MG tablet Take 81 mg by mouth daily.    Marland Kitchen atorvastatin (LIPITOR) 40 MG tablet Take 40 mg by mouth daily.    . Canagliflozin (INVOKANA) 300 MG TABS Take 300 mg by mouth daily.  . Cholecalciferol (VITAMIN D3) 3000 units TABS Take 4,000 Int'l Units by mouth.  . citalopram (CELEXA) 40 MG tablet TAKE 1 TABLET ONCE DAILY.  . Dulaglutide (TRULICITY [place]) Inject into the skin once a week. Reported on 09/10/2015  . glucose blood (ACCU-CHEK COMPACT STRIPS) test strip Use as instructed   . insulin glargine (LANTUS) 100 UNIT/ML injection Inject 60 Units into the skin 2 (two) times daily.   . insulin lispro (HUMALOG [place]PEN) 100 UNIT/ML injection Inject 50 Units into the skin 3 (three) times daily with meals. 50 units not exceeding 200 units a day  . levothyroxine (SYNTHROID, LEVOTHROID) 137 MCG tablet Take 137 mcg by mouth daily.    Marland Kitchen losartan-hydrochlorothiazide (HYZAAR) 100-25 MG per tablet Take 1 tablet by mouth daily.  . meloxicam (MOBIC) 15 MG tablet Once daily with food for pain  . MILK THISTLE PO Take 280 mg by mouth.  Marland Kitchen omeprazole (PRILOSEC) 20 MG capsule TAKE (1) CAPSULE DAILY.  . tamsulosin (FLOMAX) 0.4 MG CAPS capsule TAKE 1 CAPSULE DAILY AFTER SUPPER.   No current facility-administered medications on file prior to visit.     Problem list He  has ASTHMA; OSA (obstructive sleep apnea); Carpal tunnel syndrome of right wrist; Depression; Diabetes mellitus type 2, insulin dependent ([place]); Obesity; and Hyperlipidemia on his problem list.  Review of Systems  Constitutional: Negative.   HENT: Negative.   Respiratory: Negative.   Cardiovascular: Negative.   Gastrointestinal: Negative.   Genitourinary: Negative.   Musculoskeletal: Negative.   Skin: Positive for rash. Negative for color change, pallor and wound.  Neurological: Negative.   Psychiatric/Behavioral: Negative.        Objective:   Physical Exam  Musculoskeletal: He exhibits no edema.  Decreased sensation bilateral feet, good cap refill, bounding DP and decreased TP  Skin: Skin is warm and dry. Rash (left medial lower leg, superior to mallelous, with vertical linear healing scabs about 3cm long with mild sourrounding ertythema, no warmth, tenderness) noted.      Assessment & Plan:  Diabetes mellitus type 2, insulin dependent ([place]) Discussed general issues about diabetes pathophysiology and management., Educational material distributed., Suggested low cholesterol diet., Encouraged aerobic exercise., Discussed foot care., Reminded to get yearly retinal exam.  2. Rash DDX- contact derm, vascular rash, shingles Will get ABI since DM, decreased pulses - triamcinolone cream (KENALOG) 0.5 %; Apply 1 application topically 2 (two) times daily.  Dispense: 80 g; Refill: 2 - VAS [place] LOWER EXTREMITY ARTERIAL DUPLEX; Future - VAS [place] ABI WITH/WO TBI; Future  Future Appointments Date Time Provider [place]  08/10/2016 3:00 PM Starlyn Skeans, PA-C [facility] None

## 2016-07-06 ENCOUNTER — Other Ambulatory Visit: Payer: Self-pay | Admitting: Physician Assistant

## 2016-07-06 DIAGNOSIS — R0989 Other specified symptoms and signs involving the circulatory and respiratory systems: Secondary | ICD-10-CM

## 2016-07-14 ENCOUNTER — Ambulatory Visit (HOSPITAL_COMMUNITY)
Admission: RE | Admit: 2016-07-14 | Discharge: 2016-07-14 | Disposition: A | Discharge status: Home / Self Care | Payer: 59 | Source: Ambulatory Visit | Attending: Cardiovascular Disease | Admitting: Cardiovascular Disease

## 2016-07-14 DIAGNOSIS — M79605 Pain in left leg: Secondary | ICD-10-CM | POA: Diagnosis not present

## 2016-07-14 DIAGNOSIS — E119 Type 2 diabetes mellitus without complications: Secondary | ICD-10-CM | POA: Insufficient documentation

## 2016-07-14 DIAGNOSIS — R21 Rash and other nonspecific skin eruption: Secondary | ICD-10-CM | POA: Insufficient documentation

## 2016-07-14 DIAGNOSIS — M79604 Pain in right leg: Secondary | ICD-10-CM | POA: Insufficient documentation

## 2016-07-14 DIAGNOSIS — R0989 Other specified symptoms and signs involving the circulatory and respiratory systems: Secondary | ICD-10-CM

## 2016-08-10 ENCOUNTER — Encounter: Payer: Self-pay | Admitting: Internal Medicine

## 2016-08-30 ENCOUNTER — Encounter: Payer: Self-pay | Admitting: Internal Medicine

## 2016-09-06 ENCOUNTER — Encounter: Payer: Self-pay | Admitting: Physician Assistant

## 2016-09-08 ENCOUNTER — Other Ambulatory Visit: Payer: Self-pay

## 2016-09-08 MED ORDER — EMPAGLIFLOZIN 25 MG PO TABS: 25.0000 mg | ORAL_TABLET | Freq: Every day | ORAL | 1 refills | Status: DC

## 2016-09-20 ENCOUNTER — Encounter: Payer: Self-pay | Admitting: Internal Medicine

## 2016-09-20 ENCOUNTER — Ambulatory Visit (INDEPENDENT_AMBULATORY_CARE_PROVIDER_SITE_OTHER): Payer: Managed Care, Other (non HMO) | Admitting: Internal Medicine

## 2016-09-20 VITALS — BP 124/84 | HR 80 | Temp 97.5°F | Resp 16 | Ht 71.5 in | Wt 234.8 lb

## 2016-09-20 DIAGNOSIS — Z79899 Other long term (current) drug therapy: Secondary | ICD-10-CM | POA: Diagnosis not present

## 2016-09-20 DIAGNOSIS — E782 Mixed hyperlipidemia: Secondary | ICD-10-CM

## 2016-09-20 DIAGNOSIS — E119 Type 2 diabetes mellitus without complications: Secondary | ICD-10-CM

## 2016-09-20 DIAGNOSIS — Z0001 Encounter for general adult medical examination with abnormal findings: Secondary | ICD-10-CM

## 2016-09-20 DIAGNOSIS — E349 Endocrine disorder, unspecified: Secondary | ICD-10-CM

## 2016-09-20 DIAGNOSIS — Z0000 Encounter for general adult medical examination without abnormal findings: Secondary | ICD-10-CM

## 2016-09-20 DIAGNOSIS — Z125 Encounter for screening for malignant neoplasm of prostate: Secondary | ICD-10-CM

## 2016-09-20 DIAGNOSIS — Z136 Encounter for screening for cardiovascular disorders: Secondary | ICD-10-CM

## 2016-09-20 DIAGNOSIS — E559 Vitamin D deficiency, unspecified: Secondary | ICD-10-CM | POA: Diagnosis not present

## 2016-09-20 DIAGNOSIS — I10 Essential (primary) hypertension: Secondary | ICD-10-CM | POA: Diagnosis not present

## 2016-09-20 DIAGNOSIS — E039 Hypothyroidism, unspecified: Secondary | ICD-10-CM

## 2016-09-20 DIAGNOSIS — Z1211 Encounter for screening for malignant neoplasm of colon: Secondary | ICD-10-CM

## 2016-09-20 DIAGNOSIS — R5383 Other fatigue: Secondary | ICD-10-CM

## 2016-09-20 DIAGNOSIS — Z1212 Encounter for screening for malignant neoplasm of rectum: Secondary | ICD-10-CM

## 2016-09-20 DIAGNOSIS — Z794 Long term (current) use of insulin: Secondary | ICD-10-CM

## 2016-09-20 LAB — IRON AND TIBC
%SAT: 30 % (ref 15–60)
Iron: 110 ug/dL (ref 50–180)
TIBC: 364 ug/dL (ref 250–425)
UIBC: 254 ug/dL

## 2016-09-20 LAB — HEPATIC FUNCTION PANEL
ALT: 71 U/L — AB (ref 9–46)
AST: 39 U/L — ABNORMAL HIGH (ref 10–35)
Albumin: 4.9 g/dL (ref 3.6–5.1)
Alkaline Phosphatase: 87 U/L (ref 40–115)
Bilirubin, Direct: 0.1 mg/dL (ref <=0.2)
Indirect Bilirubin: 0.6 mg/dL (ref 0.2–1.2)
TOTAL PROTEIN: 8.1 g/dL (ref 6.1–8.1)
Total Bilirubin: 0.7 mg/dL (ref 0.2–1.2)

## 2016-09-20 LAB — LIPID PANEL
CHOLESTEROL: 186 mg/dL (ref <200)
HDL: 42 mg/dL (ref >40)
LDL Cholesterol: 108 mg/dL — ABNORMAL HIGH (ref <100)
Total CHOL/HDL Ratio: 4.4 Ratio (ref <5.0)
Triglycerides: 182 mg/dL — ABNORMAL HIGH (ref <150)
VLDL: 36 mg/dL — AB (ref <30)

## 2016-09-20 LAB — BASIC METABOLIC PANEL WITH GFR
BUN: 23 mg/dL (ref 7–25)
CALCIUM: 9.7 mg/dL (ref 8.6–10.3)
CO2: 22 mmol/L (ref 20–31)
CREATININE: 1.05 mg/dL (ref 0.70–1.33)
Chloride: 99 mmol/L (ref 98–110)
GFR, Est African American: >89 mL/min (ref >=60)
GFR, Est Non African American: 81 mL/min (ref >=60)
GLUCOSE: 212 mg/dL — AB (ref 65–99)
Potassium: 4.1 mmol/L (ref 3.5–5.3)
Sodium: 135 mmol/L (ref 135–146)

## 2016-09-20 LAB — TSH: TSH: 5.78 mIU/L — ABNORMAL HIGH (ref 0.40–4.50)

## 2016-09-20 MED ORDER — METFORMIN HCL ER 500 MG PO TB24: ORAL_TABLET | ORAL | 1 refills | Status: DC

## 2016-09-20 NOTE — Progress Notes (Signed)
ADULT & ADOLESCENT INTERNAL MEDICINE   Unk Pinto, M.D.      Uvaldo Bristle. Silverio Lay, P.A.-C [facility]                [address] [place], [place] [address] Telephone [phone] Telefax [phone] Annual  Screening/Preventative Visit  & Comprehensive Evaluation & Examination     This very nice 51 y.o. MWM presents for a Screening/Preventative Visit & comprehensive evaluation and management of multiple medical co-morbidities.  Patient has been followed for HTN, T2_NIDDM  , Hyperlipidemia and Vitamin D Deficiency.     HTN predates since  age 46 yo circa 31. Patient's BP has been controlled at home.  Today's BP is at goal -  124/84. Patient denies any cardiac symptoms as chest pain, palpitations, shortness of breath, dizziness or ankle swelling.     Patient's hyperlipidemia is not controlled with diet and medications. Patient denies myalgias or other medication SE's. Last lipids were not at goal" Lab Results  Component Value Date   CHOL 182 04/18/2016   HDL 47 04/18/2016   LDLCALC 107 (H) 04/18/2016   LDLDIRECT 120.3 11/07/2006   TRIG 140 04/18/2016   CHOLHDL 3.9 04/18/2016      Patient has Morbid Obesity (BMI 32+) and consequent  T2_NIDDM since age 63 yo about 2000 and patient denies reactive hypoglycemic symptoms, visual blurring or paresthesias.  Patient failed initial trial on Metformin due to Diarrhea and was started on insulin. Then later was added invokana - recently switched by formulary to [place]. Also he's on Trulicity shots weekly.   Fo Insulin , he take Lantus 40 mg bid and Novalog 40 units bid. He admits N x 2.  Patient was started on Tamsulosin for his nocturia , but notes no improvement and actually denies any LUTS. Last A1c was 8.6% - not at goal .      Patient had a thyroidectomy for Goiter in 2002 and was begun on thyroid Replacement. Finally, patient has history of Vitamin D Deficiency and last vitamin  D was very low : Lab Results  Component Value Date   VD25OH 20 (L) 08/10/2015   Current Outpatient Prescriptions on File Prior to Visit  Medication Sig  . aspirin 81 MG  Take 81 mg by mouth daily.    Marland Kitchen atorvastatin  40 MG  Take 40 mg by mouth daily.    Marland Kitchen VITAMIN D 3000 units  Take 4,000 Int'l Units by mouth.  . citalopram 40 MG  TAKE 1 TABLET ONCE DAILY.  . TRULICITY [place] Inject into the skin once a week. Reported on 09/10/2015  . JARDIANCE 25 MG  Take 25 mg by mouth daily.  Marland Kitchen LANTUS Inject 40 Units into the skin 2 x daily.   Marland Kitchen HUMALOG KWIKPEN Inject 40 Units into the skin 2 x times daily with meals.   Marland Kitchen levothyroxine  137 MCG Take 137 mcg by mouth daily.    Marland Kitchen losartan-hctz 100-25  Take 1 tablet by mouth daily.  Marland Kitchen MILK THISTLE  Take 280 mg by mouth.  Marland Kitchen omeprazole  20 MG  TAKE (1) CAPSULE DAILY.  . tamsulosin  0.4 MG  TAKE 1 CAPSULE DAILY AFTER SUPPER.   No Known Allergies   Past Medical History:  Diagnosis Date  . Anxiety   . ASTHMA 10/28/2006  . Cough   . Diabetes mellitus type II   . GERD (  gastroesophageal reflux disease)   . Hyperlipidemia   . Hypertension    DR TODD PCP,PT HAS NOT SEEN >3 YRS  . Hypothyroidism   . Lumbar back pain   . NASH (nonalcoholic steatohepatitis)   . Numbness   . OSA (obstructive sleep apnea) 11/19/2010  . Sleep apnea    Pt states mild case, doesn't use cpap   Health Maintenance  Topic Date Due  . PNEUMOCOCCAL POLYSACCHARIDE VACCINE (1) 06/01/1965  . OPHTHALMOLOGY EXAM  06/01/1973  . FOOT EXAM  08/09/2016  . HIV Screening  09/07/2017 (Originally 06/01/1978)  . HEMOGLOBIN A1C  10/16/2016  . INFLUENZA VACCINE  11/02/2016  . COLONOSCOPY  09/23/2020  . TETANUS/TDAP  08/09/2025  . Hepatitis C Screening  Completed   Immunization History  Administered Date(s) Administered  . Influenza Split 01/16/2014  . Tdap 08/10/2015   Past Surgical History:  Procedure Laterality Date  . bilateral shoulder surgery    . right knee repair  1997  .  THYROIDECTOMY     right  . TRIGGER FINGER RELEASE    . TRIGGER FINGER RELEASE  02/23/2011   Procedure: RELEASE TRIGGER FINGER/A-1 PULLEY;  Surgeon: Meredith Pel;  Location: [place];  Service: Orthopedics;  Laterality: Right;  Right 4th trigger finger release   Family History  Problem Relation Age of Onset  . Osteoarthritis Father   . Heart attack Father   . Skin cancer Father   . Breast cancer Mother   . Colon cancer Neg Hx   . Esophageal cancer Neg Hx   . Rectal cancer Neg Hx   . Stomach cancer Neg Hx    Social History  . Marital status: Married    Spouse name: N/A  . Number of children: 2  . Years of education: N/A   Occupational History  . [employer] broadcast assistant [employer]   Social History Main Topics  . Smoking status: Never Smoker  . Smokeless tobacco: Never Used  . Alcohol use 0.0 oz/week     Comment: 20 beers per week  . Drug use: No  . Sexual activity: Not on file    ROS Constitutional: Denies fever, chills, weight loss/gain, headaches, insomnia,  night sweats or change in appetite. Does c/o fatigue. Eyes: Denies redness, blurred vision, diplopia, discharge, itchy or watery eyes.  ENT: Denies discharge, congestion, post nasal drip, epistaxis, sore throat, earache, hearing loss, dental pain, Tinnitus, Vertigo, Sinus pain or snoring.  Cardio: Denies chest pain, palpitations, irregular heartbeat, syncope, dyspnea, diaphoresis, orthopnea, PND, claudication or edema Respiratory: denies cough, dyspnea, DOE, pleurisy, hoarseness, laryngitis or wheezing.  Gastrointestinal: Denies dysphagia, heartburn, reflux, water brash, pain, cramps, nausea, vomiting, bloating, diarrhea, constipation, hematemesis, melena, hematochezia, jaundice or hemorrhoids Genitourinary: Denies dysuria, frequency, urgency, nocturia, hesitancy, discharge, hematuria or flank pain Musculoskeletal: Denies arthralgia, myalgia, stiffness, Jt. Swelling, pain, limp or strain/sprain. Denies Falls. Skin: Denies  puritis, rash, hives, warts, acne, eczema or change in skin lesion Neuro: No weakness, tremor, incoordination, spasms, paresthesia or pain Psychiatric: Denies confusion, memory loss or sensory loss. Denies Depression. Endocrine: Denies change in weight, skin, hair change, nocturia, and paresthesia, diabetic polys, visual blurring or hyper / hypo glycemic episodes.  Heme/Lymph: No excessive bleeding, bruising or enlarged lymph nodes.  Physical Exam  BP 124/84   Pulse 80   Temp 97.5 F (36.4 C)   Resp 16   Ht 5' 11.5" (1.816 m)   Wt 234 lb 12.8 oz (106.5 kg)   BMI 32.29 kg/m   General  Appearance: Well nourished and well groomed and in no apparent distress.  Eyes: PERRLA, EOMs, conjunctiva no swelling or erythema, normal fundi and vessels. Sinuses: No frontal/maxillary tenderness ENT/Mouth: EACs patent / TMs  nl. Nares clear without erythema, swelling, mucoid exudates. Oral hygiene is good. No erythema, swelling, or exudate. Tongue normal, non-obstructing. Tonsils not swollen or erythematous. Hearing normal.  Neck: Supple, thyroid normal. No bruits, nodes or JVD. Respiratory: Respiratory effort normal.  BS equal and clear bilateral without rales, rhonci, wheezing or stridor. Cardio: Heart sounds are normal with regular rate and rhythm and no murmurs, rubs or gallops. Peripheral pulses are normal and equal bilaterally without edema. No aortic or femoral bruits. Chest: symmetric with normal excursions and percussion.  Abdomen: Soft, with Nl bowel sounds. Nontender, no guarding, rebound, hernias, masses, or organomegaly.  Lymphatics: Non tender without lymphadenopathy.  Genitourinary: No hernias.Testes nl. DRE - prostate nl for age - smooth & firm w/o nodules. Musculoskeletal: Full ROM all peripheral extremities, joint stability, 5/5 strength, and normal gait. Skin: Warm and dry without rashes, lesions, cyanosis, clubbing or  ecchymosis.  Neuro: Cranial nerves intact, reflexes equal  bilaterally. Normal muscle tone, no cerebellar symptoms. Sensation intact.  Pysch: Alert and oriented X 3 with normal affect, insight and judgment appropriate.   Assessment and Plan  1. Annual Preventative/Screening Exam   2. Essential hypertension  - EKG 12-Lead - [place], RETROPERITNL ABD,  LTD - Urinalysis, Routine w reflex microscopic - Microalbumin / creatinine urine ratio - CBC with Differential/Platelet - BASIC METABOLIC PANEL WITH GFR - Magnesium - TSH  3. Hyperlipidemia, mixed  - EKG 12-Lead - [place], RETROPERITNL ABD,  LTD - Hepatic function panel - Lipid panel - TSH  4. Diabetes mellitus type 2, insulin dependent ([place])  - recc try cut lantus to 35 u bid and Novolog also to 35 units bid.  and ROV 3 weeks or prn  - EKG 12-Lead - [place], RETROPERITNL ABD,  LTD - HM DIABETES FOOT EXAM - LOW EXTREMITY NEUR EXAM DOCUM - Hemoglobin A1c - metFORMIN (GLUCOPHAGE XR) 500 MG 24 hr tablet; Take 2 tablets 2 x / day for Diabetes  Dispense: 360 tablet; Refill: 1 - to re-try starting with 1 tablet daily w/g Imodium   - Glutamic acid decarboxylase auto abs to r/o insulin sensitivity  5. Vitamin D deficiency  - VITAMIN D 25 Hydroxy   6. Acquired hypothyroidism  - TSH  7. Testosterone deficiency  - Testosterone  8. Screening for colorectal cancer  - POC Hemoccult Bld/Stl   9. Screening for prostate cancer  - PSA  10. Screening for ischemic heart disease  - EKG 12-Lead  11. Screening for AAA (aortic abdominal aneurysm)  - [place], RETROPERITNL ABD,  LTD  12. Fatigue  - Vitamin B12 - Iron and TIBC - Testosterone  13. Medication management  - Urinalysis, Routine w reflex microscopic - Microalbumin / creatinine urine ratio - CBC with Differential/Platelet - BASIC METABOLIC PANEL WITH GFR - Hepatic function panel - Magnesium - Lipid panel - TSH - Hemoglobin A1c - VITAMIN D 25 Hydroxy        Advised d/c Tamsulosin and see if voiding sx's worsen. Patient was  counseled in prudent diet, weight control to achieve/maintain BMI less than 25, BP monitoring, regular exercise and medications as discussed.  Discussed med effects and SE's. Routine screening labs and tests as requested with regular follow-up as recommended. Over 50 minutes of exam, counseling, chart review and high complex critical decision making was performed

## 2016-09-20 NOTE — Patient Instructions (Signed)

## 2016-09-21 LAB — CBC WITH DIFFERENTIAL/PLATELET
BASOS ABS: 73 {cells}/uL (ref 0–200)
Basophils Relative: 1 %
Eosinophils Absolute: 292 cells/uL (ref 15–500)
Eosinophils Relative: 4 %
HEMATOCRIT: 47.6 % (ref 38.5–50.0)
HEMOGLOBIN: 16.3 g/dL (ref 13.2–17.1)
LYMPHS ABS: 2409 {cells}/uL (ref 850–3900)
Lymphocytes Relative: 33 %
MCH: 31.8 pg (ref 27.0–33.0)
MCHC: 34.2 g/dL (ref 32.0–36.0)
MCV: 92.8 fL (ref 80.0–100.0)
MONO ABS: 511 {cells}/uL (ref 200–950)
MPV: 10.7 fL (ref 7.5–12.5)
Monocytes Relative: 7 %
Neutro Abs: 4015 cells/uL (ref 1500–7800)
Neutrophils Relative %: 55 %
Platelets: 195 10*3/uL (ref 140–400)
RBC: 5.13 MIL/uL (ref 4.20–5.80)
RDW: 13.3 % (ref 11.0–15.0)
WBC: 7.3 10*3/uL (ref 3.8–10.8)

## 2016-09-21 LAB — TESTOSTERONE: Testosterone: 461 ng/dL (ref 250–827)

## 2016-09-21 LAB — URINALYSIS, ROUTINE W REFLEX MICROSCOPIC
BILIRUBIN URINE: NEGATIVE
HGB URINE DIPSTICK: NEGATIVE
Ketones, ur: NEGATIVE
LEUKOCYTES UA: NEGATIVE
Nitrite: NEGATIVE
PH: 5.5 (ref 5.0–8.0)
Protein, ur: NEGATIVE
Specific Gravity, Urine: 1.034 (ref 1.001–1.035)

## 2016-09-21 LAB — URINALYSIS, MICROSCOPIC ONLY
Bacteria, UA: NONE SEEN [HPF]
CASTS: NONE SEEN [LPF]
Crystals: NONE SEEN [HPF]
RBC / HPF: NONE SEEN RBC/HPF (ref <=2)
SQUAMOUS EPITHELIAL / LPF: NONE SEEN [HPF] (ref <=5)
WBC UA: NONE SEEN WBC/HPF (ref <=5)
Yeast: NONE SEEN [HPF]

## 2016-09-21 LAB — MICROALBUMIN / CREATININE URINE RATIO
Creatinine, Urine: 59 mg/dL (ref 20–370)
MICROALB/CREAT RATIO: 8 ug/mg{creat} (ref <30)
Microalb, Ur: 0.5 mg/dL

## 2016-09-21 LAB — PSA: PSA: 0.5 ng/mL (ref <=4.0)

## 2016-09-21 LAB — HEMOGLOBIN A1C
HEMOGLOBIN A1C: 10.1 % — AB (ref <5.7)
MEAN PLASMA GLUCOSE: 243 mg/dL

## 2016-09-21 LAB — VITAMIN D 25 HYDROXY (VIT D DEFICIENCY, FRACTURES): Vit D, 25-Hydroxy: 27 ng/mL — ABNORMAL LOW (ref 30–100)

## 2016-09-21 LAB — MAGNESIUM: MAGNESIUM: 2 mg/dL (ref 1.5–2.5)

## 2016-09-21 LAB — VITAMIN B12: VITAMIN B 12: 509 pg/mL (ref 200–1100)

## 2016-09-22 LAB — GLUTAMIC ACID DECARBOXYLASE AUTO ABS: Glutamic Acid Decarb Ab: <5 IU/mL (ref <5)

## 2016-10-10 ENCOUNTER — Other Ambulatory Visit: Payer: Self-pay | Admitting: Internal Medicine

## 2016-10-12 ENCOUNTER — Ambulatory Visit: Payer: Self-pay | Admitting: Internal Medicine

## 2016-10-15 NOTE — Progress Notes (Signed)
[place]

## 2016-11-10 ENCOUNTER — Ambulatory Visit: Payer: Self-pay | Admitting: Internal Medicine

## 2016-11-28 ENCOUNTER — Ambulatory Visit: Payer: Self-pay | Admitting: Internal Medicine

## 2016-11-28 NOTE — Progress Notes (Signed)
Wife called at 9:30 am  to reschedule his 9:30 appointment

## 2016-12-03 ENCOUNTER — Other Ambulatory Visit: Payer: Self-pay | Admitting: Internal Medicine

## 2016-12-12 ENCOUNTER — Ambulatory Visit: Payer: Self-pay | Admitting: Internal Medicine

## 2016-12-20 ENCOUNTER — Ambulatory Visit: Payer: Self-pay | Admitting: Internal Medicine

## 2016-12-27 ENCOUNTER — Ambulatory Visit: Payer: Self-pay | Admitting: Physician Assistant

## 2017-01-11 ENCOUNTER — Other Ambulatory Visit: Payer: Self-pay | Admitting: Internal Medicine

## 2017-01-21 ENCOUNTER — Other Ambulatory Visit: Payer: Self-pay | Admitting: Internal Medicine

## 2017-01-21 ENCOUNTER — Encounter: Payer: Self-pay | Admitting: Internal Medicine

## 2017-01-24 ENCOUNTER — Other Ambulatory Visit: Payer: Self-pay | Admitting: Internal Medicine

## 2017-01-26 ENCOUNTER — Other Ambulatory Visit: Payer: Self-pay

## 2017-01-26 ENCOUNTER — Other Ambulatory Visit: Payer: Self-pay | Admitting: Internal Medicine

## 2017-01-26 MED ORDER — LOSARTAN POTASSIUM-HCTZ 100-25 MG PO TABS: 1.0000 | ORAL_TABLET | Freq: Every morning | ORAL | 0 refills | Status: DC

## 2017-01-30 ENCOUNTER — Other Ambulatory Visit: Payer: Self-pay | Admitting: Internal Medicine

## 2017-02-20 ENCOUNTER — Other Ambulatory Visit: Payer: Self-pay | Admitting: *Deleted

## 2017-02-20 ENCOUNTER — Ambulatory Visit: Payer: Managed Care, Other (non HMO) | Admitting: Internal Medicine

## 2017-02-20 ENCOUNTER — Encounter: Payer: Self-pay | Admitting: Internal Medicine

## 2017-02-20 VITALS — BP 132/82 | HR 88 | Temp 97.6°F | Resp 18 | Ht 71.5 in | Wt 227.8 lb

## 2017-02-20 DIAGNOSIS — E039 Hypothyroidism, unspecified: Secondary | ICD-10-CM

## 2017-02-20 DIAGNOSIS — E559 Vitamin D deficiency, unspecified: Secondary | ICD-10-CM | POA: Diagnosis not present

## 2017-02-20 DIAGNOSIS — E782 Mixed hyperlipidemia: Secondary | ICD-10-CM | POA: Diagnosis not present

## 2017-02-20 DIAGNOSIS — Z794 Long term (current) use of insulin: Secondary | ICD-10-CM

## 2017-02-20 DIAGNOSIS — Z79899 Other long term (current) drug therapy: Secondary | ICD-10-CM | POA: Diagnosis not present

## 2017-02-20 DIAGNOSIS — Z23 Encounter for immunization: Secondary | ICD-10-CM

## 2017-02-20 DIAGNOSIS — I10 Essential (primary) hypertension: Secondary | ICD-10-CM

## 2017-02-20 DIAGNOSIS — E119 Type 2 diabetes mellitus without complications: Secondary | ICD-10-CM

## 2017-02-20 MED ORDER — INSULIN NPH ISOPHANE & REGULAR (70-30) 100 UNIT/ML ~~LOC~~ SUSP: SUBCUTANEOUS | 3 refills | Status: DC

## 2017-02-20 NOTE — Progress Notes (Signed)
This very nice 51 y.o. MWM returns belatedly lost to follow for his DM and also is followed for  Hypertension, Hyperlipidemia and Vitamin D Deficiency.      Patient is treated for HTN ( age 78 in 27)  & BP has been controlled at home. Today's BP: 132/82. Patient has had no complaints of any cardiac type chest pain, palpitations, dyspnea / orthopnea / PND, dizziness, claudication, or dependent edema.     Hyperlipidemia is controlled with diet & meds. Patient denies myalgias or other med SE's. Last Lipids were not at goal: Lab Results  Component Value Date   CHOL 186 09/20/2016   HDL 42 09/20/2016   LDLCALC 108 (H) 09/20/2016   LDLDIRECT 120.3 11/07/2006   TRIG 182 (H) 09/20/2016   CHOLHDL 4.4 09/20/2016      Also, the patient has history of Morbid Obesity / T2_NIDDM (age 51 yo / 2000) .  Last A1c was 10.1% in June. Patient had been on a multi-drug regimen of Lantus/Novolog, Jardiance and weekly Trulicity approximating $2000/month. He related Intolerance to MF with Diarrhea, HE was convinced to retry and apparently is tolerating well. As he had missed multiple app't , he has been off of his other meds and reports his CBG's have ranged about 250 mg% range and has had no symptoms of reactive hypoglycemia, diabetic polys, paresthesias or visual blurring.       Patient has been on thyroid replacement since thyroidectomy in 2002 for Goiter. Further, the patient also has history of Vitamin D Deficiency and supplements vitamin D without any suspected side-effects. Last vitamin D was still very low.  Lab Results  Component Value Date   VD25OH 27 (L) 09/20/2016   Current Outpatient Medications on File Prior to Visit  Medication Sig  . aspirin 81 MG tablet Take 81 mg by mouth daily.    Marland Kitchen atorvastatin (LIPITOR) 80 MG tablet TAKE ONE TABLET AT BEDTIME.  Marland Kitchen Cholecalciferol (VITAMIN D3) 3000 units TABS Take 4,000 Int'l Units by mouth.  . citalopram (CELEXA) 40 MG tablet TAKE 1 TABLET ONCE DAILY.    Marland Kitchen glucose blood (ACCU-CHEK COMPACT STRIPS) test strip Use as instructed   . levothyroxine (SYNTHROID, LEVOTHROID) 137 MCG tablet TAKE 1 TABLET EACH DAY.  Marland Kitchen losartan-hydrochlorothiazide (HYZAAR) 100-25 MG tablet Take 1 tablet by mouth every morning.  . metFORMIN (GLUCOPHAGE XR) 500 MG 24 hr tablet Take 2 tablets 2 x / day for Diabetes  . MILK THISTLE PO Take 280 mg by mouth.  Marland Kitchen omeprazole (PRILOSEC) 20 MG capsule TAKE (1) CAPSULE DAILY.   No current facility-administered medications on file prior to visit.    Not on File PMHx:   Past Medical History:  Diagnosis Date  . Anxiety   . ASTHMA 10/28/2006  . Cough   . Diabetes mellitus type II   . GERD (gastroesophageal reflux disease)   . Hyperlipidemia   . Hypertension    DR TODD PCP,PT HAS NOT SEEN >3 YRS  . Hypothyroidism   . Lumbar back pain   . NASH (nonalcoholic steatohepatitis)   . Numbness   . OSA (obstructive sleep apnea) 11/19/2010  . Sleep apnea    Pt states mild case, doesn't use cpap   Immunization History  Administered Date(s) Administered  . Influenza Split 01/16/2014  . Tdap 08/10/2015   Past Surgical History:  Procedure Laterality Date  . bilateral shoulder surgery    . RELEASE TRIGGER FINGER/A-1 PULLEY Right 02/23/2011   Performed by Marcene Duos  Nicki Reaper, MD at [facility]  . right knee repair  1997  . THYROIDECTOMY     right  . TRIGGER FINGER RELEASE     FHx:    Reviewed / unchanged  SHx:    Reviewed / unchanged  Systems Review:  Constitutional: Denies fever, chills, wt changes, headaches, insomnia, fatigue, night sweats, change in appetite. Eyes: Denies redness, blurred vision, diplopia, discharge, itchy, watery eyes.  ENT: Denies discharge, congestion, post nasal drip, epistaxis, sore throat, earache, hearing loss, dental pain, tinnitus, vertigo, sinus pain, snoring.  CV: Denies chest pain, palpitations, irregular heartbeat, syncope, dyspnea, diaphoresis, orthopnea, PND, claudication or edema. Respiratory:  denies cough, dyspnea, DOE, pleurisy, hoarseness, laryngitis, wheezing.  Gastrointestinal: Denies dysphagia, odynophagia, heartburn, reflux, water brash, abdominal pain or cramps, nausea, vomiting, bloating, diarrhea, constipation, hematemesis, melena, hematochezia  or hemorrhoids. Genitourinary: Denies dysuria, frequency, urgency, nocturia, hesitancy, discharge, hematuria or flank pain. Musculoskeletal: Denies arthralgias, myalgias, stiffness, jt. swelling, pain, limping or strain/sprain.  Skin: Denies pruritus, rash, hives, warts, acne, eczema or change in skin lesion(s). Neuro: No weakness, tremor, incoordination, spasms, paresthesia or pain. Psychiatric: Denies confusion, memory loss or sensory loss. Endo: Denies change in weight, skin or hair change.  Heme/Lymph: No excessive bleeding, bruising or enlarged lymph nodes.  Physical Exam  BP 132/82   Pulse 88   Temp 97.6 F (36.4 C)   Resp 18   Ht 5' 11.5" (1.816 m)   Wt 227 lb 12.8 oz (103.3 kg)   BMI 31.33 kg/m   Appears well nourished, well groomed  and in no distress.  Eyes: PERRLA, EOMs, conjunctiva no swelling or erythema. Sinuses: No frontal/maxillary tenderness ENT/Mouth: EAC's clear, TM's nl w/o erythema, bulging. Nares clear w/o erythema, swelling, exudates. Oropharynx clear without erythema or exudates. Oral hygiene is good. Tongue normal, non obstructing. Hearing intact.  Neck: Supple. Thyroid nl. Car 2+/2+ without bruits, nodes or JVD. Chest: Respirations nl with BS clear & equal w/o rales, rhonchi, wheezing or stridor.  Cor: Heart sounds normal w/ regular rate and rhythm without sig. murmurs, gallops, clicks or rubs. Peripheral pulses normal and equal  without edema.  Abdomen: Soft & bowel sounds normal. Non-tender w/o guarding, rebound, hernias, masses or organomegaly.  Lymphatics: Unremarkable.  Musculoskeletal: Full ROM all peripheral extremities, joint stability, 5/5 strength and normal gait.  Skin: Warm, dry  without exposed rashes, lesions or ecchymosis apparent.  Neuro: Cranial nerves intact, reflexes equal bilaterally. Sensory-motor testing grossly intact. Tendon reflexes grossly intact.  Pysch: Alert & oriented x 3.  Insight and judgement nl & appropriate. No ideations.  Assessment and Plan:  1. Essential hypertension  - Continue medication, monitor blood pressure at home.  - Continue DASH diet. Reminder to go to the ER if any CP,  SOB, nausea, dizziness, severe HA, changes vision/speech.  - CBC with Differential/Platelet - BASIC METABOLIC PANEL WITH GFR - Magnesium - TSH  2. Hyperlipidemia, mixed  - Continue diet/meds, exercise,& lifestyle modifications.  - Continue monitor periodic cholesterol/liver & renal functions   - Hepatic function panel - Lipid panel - TSH  3. Diabetes mellitus type 2, insulin dependent ([place])  - Extensive diabetic counseling and patient was recommend to start Novolin 70/30 mix at 40 u qam & 20 u qpm monitoring bid CBG's and to increase insulin by 5 units if CBG's remain over 180 mg% - ROV 1 month to review or call sooner if questions. - Continue diet, exercise, lifestyle modifications.  - Monitor appropriate labs.  - Hemoglobin A1c  4.  Vitamin D deficiency  - Continue supplementation.  - VITAMIN D 25 Hydroxy  5. Acquired hypothyroidism  - TSH  6. Medication management        Discussed  regular exercise, BP monitoring, weight control to achieve/maintain BMI less than 25 and discussed med and SE's. Recommended labs to assess and monitor clinical status with further disposition pending results of labs. Over 30 minutes of exam, counseling, chart review was performed.

## 2017-02-20 NOTE — Patient Instructions (Addendum)
Start Novolin 70/30 Mix  At   40 units with Breakfast &  20 Units with supper   If Fasting glucoses are running over 180 -   You may increase each dose of Insulin 5 units  +++++++++++++++++++++++++++++ Recommend Adult Low Dose Aspirin or  coated  Aspirin 81 mg daily  To reduce risk of Colon Cancer 20 %,  Skin Cancer 26 % ,  Melanoma 46%  and  Pancreatic cancer 60% +++++++++++++++++++++++++ Vitamin D goal  is between 70-100.  Please make sure that you are taking your Vitamin D as directed.  It is very important as a natural anti-inflammatory  helping hair, skin, and nails, as well as reducing stroke and heart attack risk.  It helps your bones and helps with mood. It also decreases numerous cancer risks so please take it as directed.  Low Vit D is associated with a 200-300% higher risk for CANCER  and 200-300% higher risk for HEART   ATTACK  &  STROKE.   .....................................Marland Kitchen It is also associated with higher death rate at younger ages,  autoimmune diseases like Rheumatoid arthritis, Lupus, Multiple Sclerosis.    Also many other serious conditions, like depression, Alzheimer's Dementia, infertility, muscle aches, fatigue, fibromyalgia - just to name a few. ++++++++++++++++++++ Recommend the book "The END of DIETING" by Dr Excell Seltzer  & the book "The END of DIABETES " by Dr Excell Seltzer At [email or web address] - get book & Audio CD's    Being diabetic has a  300% increased risk for heart attack, stroke, cancer, and alzheimer- type vascular dementia. It is very important that you work harder with diet by avoiding all foods that are white. Avoid white rice (brown & wild rice is OK), white potatoes (sweetpotatoes in moderation is OK), White bread or wheat bread or anything made out of white flour like bagels, donuts, rolls, buns, biscuits, cakes, pastries, cookies, pizza crust, and pasta (made from white flour & egg whites) - vegetarian pasta or spinach or wheat pasta is OK.  Multigrain breads like Arnold's or Pepperidge Farm, or multigrain sandwich thins or flatbreads.  Diet, exercise and weight loss can reverse and cure diabetes in the early stages.  Diet, exercise and weight loss is very important in the control and prevention of complications of diabetes which affects every system in your body, ie. Brain - dementia/stroke, eyes - glaucoma/blindness, heart - heart attack/heart failure, kidneys - dialysis, stomach - gastric paralysis, intestines - malabsorption, nerves - severe painful neuritis, circulation - gangrene & loss of a leg(s), and finally cancer and Alzheimers.    I recommend avoid fried & greasy foods,  sweets/candy, white rice (brown or wild rice or Quinoa is OK), white potatoes (sweet potatoes are OK) - anything made from white flour - bagels, doughnuts, rolls, buns, biscuits,white and wheat breads, pizza crust and traditional pasta made of white flour & egg white(vegetarian pasta or spinach or wheat pasta is OK).  Multi-grain bread is OK - like multi-grain flat bread or sandwich thins. Avoid alcohol in excess. Exercise is also important.    Eat all the vegetables you want - avoid meat, especially red meat and dairy - especially cheese.  Cheese is the most concentrated form of trans-fats which is the worst thing to clog up our arteries. Veggie cheese is OK which can be found in the fresh produce section at [employer] or [employer] or [employer]  +++++++++++++++++++++ DASH Eating Plan  DASH stands for "Dietary Approaches to Stop Hypertension."   The  DASH eating plan is a healthy eating plan that has been shown to reduce high blood pressure (hypertension). Additional health benefits may include reducing the risk of type 2 diabetes mellitus, heart disease, and stroke. The DASH eating plan may also help with weight loss. WHAT DO I NEED TO KNOW ABOUT THE DASH EATING PLAN? For the DASH eating plan, you will follow these general guidelines:  Choose foods with a  percent daily value for sodium of less than 5% (as listed on the food label).  Use salt-free seasonings or herbs instead of table salt or sea salt.  Check with your health care provider or pharmacist before using salt substitutes.  Eat lower-sodium products, often labeled as "lower sodium" or "no salt added."  Eat fresh foods.  Eat more vegetables, fruits, and low-fat dairy products.  Choose whole grains. Look for the word "whole" as the first word in the ingredient list.  Choose fish   Limit sweets, desserts, sugars, and sugary drinks.  Choose heart-healthy fats.  Eat veggie cheese   Eat more home-cooked food and less restaurant, buffet, and fast food.  Limit fried foods.  Cook foods using methods other than frying.  Limit canned vegetables. If you do use them, rinse them well to decrease the sodium.  When eating at a restaurant, ask that your food be prepared with less salt, or no salt if possible.                      WHAT FOODS CAN I EAT? Read Dr Fara Olden Fuhrman's books on The End of Dieting & The End of Diabetes  Grains Whole grain or whole wheat bread. Brown rice. Whole grain or whole wheat pasta. Quinoa, bulgur, and whole grain cereals. Low-sodium cereals. Corn or whole wheat flour tortillas. Whole grain cornbread. Whole grain crackers. Low-sodium crackers.  Vegetables Fresh or frozen vegetables (raw, steamed, roasted, or grilled). Low-sodium or reduced-sodium tomato and vegetable juices. Low-sodium or reduced-sodium tomato sauce and paste. Low-sodium or reduced-sodium canned vegetables.   Fruits All fresh, canned (in natural juice), or frozen fruits.  Protein Products  All fish and seafood.  Dried beans, peas, or lentils. Unsalted nuts and seeds. Unsalted canned beans.  Dairy Low-fat dairy products, such as skim or 1% milk, 2% or reduced-fat cheeses, low-fat ricotta or cottage cheese, or plain low-fat yogurt. Low-sodium or reduced-sodium cheeses.  Fats and  Oils Tub margarines without trans fats. Light or reduced-fat mayonnaise and salad dressings (reduced sodium). Avocado. Safflower, olive, or canola oils. Natural peanut or almond butter.  Other Unsalted popcorn and pretzels. The items listed above may not be a complete list of recommended foods or beverages. Contact your dietitian for more options.  +++++++++++++++  WHAT FOODS ARE NOT RECOMMENDED? Grains/ White flour or wheat flour White bread. White pasta. White rice. Refined cornbread. Bagels and croissants. Crackers that contain trans fat.  Vegetables  Creamed or fried vegetables. Vegetables in a . Regular canned vegetables. Regular canned tomato sauce and paste. Regular tomato and vegetable juices.  Fruits Dried fruits. Canned fruit in light or heavy syrup. Fruit juice.  Meat and Other Protein Products Meat in general - RED meat & White meat.  Fatty cuts of meat. Ribs, chicken wings, all processed meats as bacon, sausage, bologna, salami, fatback, hot dogs, bratwurst and packaged luncheon meats.  Dairy Whole or 2% milk, cream, half-and-half, and cream cheese. Whole-fat or sweetened yogurt. Full-fat cheeses or blue cheese. Non-dairy creamers and whipped toppings. Processed cheese, cheese  spreads, or cheese curds.  Condiments Onion and garlic salt, seasoned salt, table salt, and sea salt. Canned and packaged gravies. Worcestershire sauce. Tartar sauce. Barbecue sauce. Teriyaki sauce. Soy sauce, including reduced sodium. Steak sauce. Fish sauce. Oyster sauce. Cocktail sauce. Horseradish. Ketchup and mustard. Meat flavorings and tenderizers. Bouillon cubes. Hot sauce. Tabasco sauce. Marinades. Taco seasonings. Relishes.  Fats and Oils Butter, stick margarine, lard, shortening and bacon fat. Coconut, palm kernel, or palm oils. Regular salad dressings.  Pickles and olives. Salted popcorn and pretzels.  The items listed above may not be a complete list of foods and beverages to  avoid.

## 2017-02-21 ENCOUNTER — Other Ambulatory Visit: Payer: Self-pay | Admitting: Internal Medicine

## 2017-02-21 ENCOUNTER — Other Ambulatory Visit: Payer: Self-pay | Admitting: *Deleted

## 2017-02-21 LAB — CBC WITH DIFFERENTIAL/PLATELET
Basophils Absolute: 70 cells/uL (ref 0–200)
Basophils Relative: 0.9 %
EOS PCT: 3.2 %
Eosinophils Absolute: 250 cells/uL (ref 15–500)
HEMATOCRIT: 46.3 % (ref 38.5–50.0)
HEMOGLOBIN: 16.4 g/dL (ref 13.2–17.1)
Lymphs Abs: 2168 cells/uL (ref 850–3900)
MCH: 32.8 pg (ref 27.0–33.0)
MCHC: 35.4 g/dL (ref 32.0–36.0)
MCV: 92.6 fL (ref 80.0–100.0)
MPV: 10.9 fL (ref 7.5–12.5)
Monocytes Relative: 7.8 %
NEUTROS PCT: 60.3 %
Neutro Abs: 4703 cells/uL (ref 1500–7800)
Platelets: 215 10*3/uL (ref 140–400)
RBC: 5 10*6/uL (ref 4.20–5.80)
RDW: 12.1 % (ref 11.0–15.0)
TOTAL LYMPHOCYTE: 27.8 %
WBC mixed population: 608 cells/uL (ref 200–950)
WBC: 7.8 10*3/uL (ref 3.8–10.8)

## 2017-02-21 LAB — HEPATIC FUNCTION PANEL
AG Ratio: 1.4 (calc) (ref 1.0–2.5)
ALT: 43 U/L (ref 9–46)
AST: 24 U/L (ref 10–35)
Albumin: 5 g/dL (ref 3.6–5.1)
Alkaline phosphatase (APISO): 102 U/L (ref 40–115)
BILIRUBIN INDIRECT: 0.6 mg/dL (ref 0.2–1.2)
BILIRUBIN TOTAL: 0.7 mg/dL (ref 0.2–1.2)
Bilirubin, Direct: 0.1 mg/dL (ref 0.0–0.2)
Globulin: 3.5 g/dL (calc) (ref 1.9–3.7)
Total Protein: 8.5 g/dL — ABNORMAL HIGH (ref 6.1–8.1)

## 2017-02-21 LAB — HEMOGLOBIN A1C
Hgb A1c MFr Bld: 10.5 % of total Hgb — ABNORMAL HIGH (ref <5.7)
MEAN PLASMA GLUCOSE: 255 (calc)
eAG (mmol/L): 14.1 (calc)

## 2017-02-21 LAB — BASIC METABOLIC PANEL WITH GFR
BUN: 23 mg/dL (ref 7–25)
CHLORIDE: 98 mmol/L (ref 98–110)
CO2: 26 mmol/L (ref 20–32)
Calcium: 10.2 mg/dL (ref 8.6–10.3)
Creat: 1.05 mg/dL (ref 0.70–1.33)
GFR, EST AFRICAN AMERICAN: 93 mL/min/{1.73_m2} (ref >=60)
GFR, EST NON AFRICAN AMERICAN: 81 mL/min/{1.73_m2} (ref >=60)
Glucose, Bld: 395 mg/dL — ABNORMAL HIGH (ref 65–99)
POTASSIUM: 4 mmol/L (ref 3.5–5.3)
SODIUM: 138 mmol/L (ref 135–146)

## 2017-02-21 LAB — VITAMIN D 25 HYDROXY (VIT D DEFICIENCY, FRACTURES): VIT D 25 HYDROXY: 27 ng/mL — AB (ref 30–100)

## 2017-02-21 LAB — LIPID PANEL
Cholesterol: 227 mg/dL — ABNORMAL HIGH (ref <200)
HDL: 52 mg/dL (ref >40)
LDL Cholesterol (Calc): 135 mg/dL (calc) — ABNORMAL HIGH
NON-HDL CHOLESTEROL (CALC): 175 mg/dL — AB (ref <130)
Total CHOL/HDL Ratio: 4.4 (calc) (ref <5.0)
Triglycerides: 262 mg/dL — ABNORMAL HIGH (ref <150)

## 2017-02-21 LAB — MAGNESIUM: MAGNESIUM: 2.2 mg/dL (ref 1.5–2.5)

## 2017-02-21 LAB — TSH: TSH: 4.99 mIU/L — ABNORMAL HIGH (ref 0.40–4.50)

## 2017-02-21 MED ORDER — FREESTYLE LIBRE SENSOR SYSTEM MISC
0 refills | Status: DC
Start: 2017-02-21 — End: 2017-04-13

## 2017-02-21 MED ORDER — OMEPRAZOLE 20 MG PO CPDR: DELAYED_RELEASE_CAPSULE | ORAL | 1 refills | Status: DC

## 2017-02-21 NOTE — Addendum Note (Signed)
Addended by: Melbourne Abts C on: 02/21/2017 08:09 AM   Modules accepted: Orders

## 2017-04-05 ENCOUNTER — Telehealth (INDEPENDENT_AMBULATORY_CARE_PROVIDER_SITE_OTHER): Payer: Self-pay | Admitting: Orthopedic Surgery

## 2017-04-05 NOTE — Telephone Encounter (Signed)
If that is what the patient wants its ok. I dont think that Dr Marlou Sa has seen him in a while. I do know that Dondra Spry is going to be opening him up to see patients on 01/07 in the a.m. So we could potentially have an appt for him there when the times open.

## 2017-04-05 NOTE — Telephone Encounter (Signed)
Pt would like an earlier appt. Is it ok if I sched him with another provider

## 2017-04-10 ENCOUNTER — Encounter (INDEPENDENT_AMBULATORY_CARE_PROVIDER_SITE_OTHER): Payer: Self-pay | Admitting: Orthopedic Surgery

## 2017-04-10 ENCOUNTER — Ambulatory Visit (INDEPENDENT_AMBULATORY_CARE_PROVIDER_SITE_OTHER): Payer: Self-pay

## 2017-04-10 ENCOUNTER — Ambulatory Visit (INDEPENDENT_AMBULATORY_CARE_PROVIDER_SITE_OTHER): Payer: 59 | Admitting: Orthopedic Surgery

## 2017-04-10 DIAGNOSIS — G8929 Other chronic pain: Secondary | ICD-10-CM

## 2017-04-10 DIAGNOSIS — M25561 Pain in right knee: Secondary | ICD-10-CM

## 2017-04-10 MED ORDER — METHYLPREDNISOLONE ACETATE 40 MG/ML IJ SUSP
40.0000 mg | INTRAMUSCULAR | Status: AC | PRN
  Administered 2017-04-10: 40 mg via INTRA_ARTICULAR

## 2017-04-10 MED ORDER — BUPIVACAINE HCL 0.25 % IJ SOLN
4.0000 mL | INTRAMUSCULAR | Status: AC | PRN
  Administered 2017-04-10: 4 mL via INTRA_ARTICULAR

## 2017-04-10 MED ORDER — LIDOCAINE HCL 1 % IJ SOLN
5.0000 mL | INTRAMUSCULAR | Status: AC | PRN
  Administered 2017-04-10: 5 mL

## 2017-04-10 NOTE — Progress Notes (Signed)
Office Visit Note   Patient: [name] [name]           Date of Birth: Aug 17, 1965           MRN: [id number] Visit Date: 04/10/2017 Requested by: [name] [name], [name] [name] [name] [name] [name] [name] [name] [name], [name] [name] [id number] PCP: [name] Pinto, MD  Subjective: Chief Complaint  Patient presents with  . Right Knee - Pain    HPI: [name] [name] is a 51 year old patient with right knee pain.  He reports chronic pain but is been worse since he had a fall January 1 of this year.  He landed on a curb.  He describes a lot of medial sided pain.  Initially had swelling but that is gotten better.  He takes ibuprofen as needed.  He has had previous right knee surgery.  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  Patient denies  fevers or chills.   Assessment & Plan: Visit Diagnoses:  1. Chronic pain of right knee     Plan: Impression is right knee pain with mild arthritis and exacerbation of existing arthritis from impact injury.  Plan is aspiration and injection of the knee today.  Did get about 25 cc out.  I will see him back as needed.  I think this is mostly pressing the reset button on some existing arthritis this in the knee  Follow-Up Instructions: Return if symptoms worsen or fail to improve.   Orders:  Orders Placed This Encounter  Procedures  . XR KNEE 3 VIEW RIGHT   No orders of the defined types were placed in this encounter.     Procedures: Large Joint Inj: R knee on 04/10/2017 1:09 PM Indications: diagnostic evaluation, joint swelling and pain Details: 18 G 1.5 in needle, superolateral approach  Arthrogram: No  Medications: 5 mL lidocaine 1 %; 40 mg methylPREDNISolone acetate 40 MG/ML; 4 mL bupivacaine 0.25 % Aspirate: 27 mL yellow Outcome: tolerated well, no immediate  complications Procedure, treatment alternatives, risks and benefits explained, specific risks discussed. Consent was given by the patient. Immediately prior to procedure a time out was called to verify the correct patient, procedure, equipment, support staff and site/side marked as required. Patient was prepped and draped in the usual sterile fashion.       Clinical Data: No additional findings.  Objective: Vital Signs: There were no vitals taken for this visit.  Physical Exam:   Constitutional: Patient appears well-developed HEENT:  Head: Normocephalic Eyes:EOM are normal Neck: Normal range of motion Cardiovascular: Normal rate Pulmonary/chest: Effort normal Neurologic: Patient is alert Skin: Skin is warm Psychiatric: Patient has normal mood and affect    Ortho Exam: Orthopedic exam demonstrates slightly antalgic gait to the right.  He does have an abrasion on the superior medial aspect of the patellar region.  There is a mild knee effusion but no warmth to the knee.  Range of motion is excellent collateral and cruciate ligaments are stable pedal pulses palpable extensor mechanism is intact  Specialty Comments:  No specialty comments available.  Imaging: Xr Knee 3 View Right  Result Date: 04/10/2017 AP lateral merchant right knee reviewed.  Previous ACL reconstruction noted.  There are mild degenerative changes throughout the medial lateral and patellofemoral compartment with no  bone-on-bone changes noted.  No loose bodies present.  Mild spurring noted in the lateral and medial compartments.  No loose bodies.    PMFS History: Patient Active Problem List   Diagnosis Date Noted  . Obesity 11/10/2014  . Hyperlipidemia 11/10/2014  . Diabetes mellitus type 2, insulin dependent ([place]) 08/14/2012  . Carpal tunnel syndrome of right wrist 09/08/2011  . Depression 09/08/2011  . OSA (obstructive sleep apnea) 11/19/2010  . ASTHMA 10/28/2006   Past Medical History:  Diagnosis  Date  . Anxiety   . ASTHMA 10/28/2006  . Cough   . Diabetes mellitus type II   . GERD (gastroesophageal reflux disease)   . Hyperlipidemia   . Hypertension    DR [name] [name] PCP,PT HAS NOT SEEN >3 YRS  . Hypothyroidism   . Lumbar back pain   . NASH (nonalcoholic steatohepatitis)   . Numbness   . OSA (obstructive sleep apnea) 11/19/2010  . Sleep apnea    Pt states mild case, doesn't use cpap    Family History  Problem Relation Age of Onset  . Osteoarthritis Father   . Heart attack Father   . Skin cancer Father   . Breast cancer Mother   . Colon cancer Neg Hx   . Esophageal cancer Neg Hx   . Rectal cancer Neg Hx   . Stomach cancer Neg Hx     Past Surgical History:  Procedure Laterality Date  . bilateral shoulder surgery    . right knee repair  1997  . THYROIDECTOMY     right  . TRIGGER FINGER RELEASE    . TRIGGER FINGER RELEASE  02/23/2011   Procedure: RELEASE TRIGGER FINGER/A-1 PULLEY;  Surgeon: Meredith Pel;  Location: [place];  Service: Orthopedics;  Laterality: Right;  Right 4th trigger finger release   Social History   Occupational History  . 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 Smoking status: Never Smoker  . Smokeless tobacco: Never Used  Substance and Sexual Activity  . Alcohol use: Yes    Alcohol/week: 0.0 oz    Comment: 20 beers per week  . Drug use: No  . Sexual activity: Not on file

## 2017-04-11 ENCOUNTER — Ambulatory Visit: Payer: Self-pay | Admitting: Internal Medicine

## 2017-04-13 ENCOUNTER — Other Ambulatory Visit: Payer: Self-pay | Admitting: *Deleted

## 2017-04-13 ENCOUNTER — Encounter: Payer: Self-pay | Admitting: Internal Medicine

## 2017-04-13 ENCOUNTER — Ambulatory Visit (INDEPENDENT_AMBULATORY_CARE_PROVIDER_SITE_OTHER): Payer: Managed Care, Other (non HMO) | Admitting: Internal Medicine

## 2017-04-13 VITALS — BP 138/98 | HR 76 | Temp 97.5°F | Resp 18 | Ht 71.5 in | Wt 230.6 lb

## 2017-04-13 DIAGNOSIS — Z794 Long term (current) use of insulin: Secondary | ICD-10-CM | POA: Diagnosis not present

## 2017-04-13 DIAGNOSIS — E119 Type 2 diabetes mellitus without complications: Secondary | ICD-10-CM | POA: Diagnosis not present

## 2017-04-13 DIAGNOSIS — I10 Essential (primary) hypertension: Secondary | ICD-10-CM | POA: Diagnosis not present

## 2017-04-13 MED ORDER — FREESTYLE LIBRE SENSOR SYSTEM MISC: 0 refills | Status: DC

## 2017-04-13 NOTE — Progress Notes (Addendum)
  Subjective:    Patient ID: [name] [name], male    DOB: [birth year], 52 y.o.   MRN: [id number]  HPI  This 52 yo MWM with Insulin Requiring T2_DM returns for f/u on simplified diabetic regimen of Metformin 2 gm/day and Novolin 70/30 at 40 units qam and 20 units qpm when he remembers. He has not been monitoring his FBG's very frequently , but denies any c/o diabetic polys, paresthesias, visual blurring or hypoglycemic sx's.   (He had been on a regimen of Lantus/Nonolog, Jardiance and Trulicity approximating $2,000/month).  Medication Sig  . aspirin 81 MG tablet Take 81 mg by mouth daily.    Marland Kitchen atorvastatin (LIPITOR) 80 MG tablet TAKE ONE TABLET AT BEDTIME.  Marland Kitchen Cholecalciferol (VITAMIN D3) 3000 units TABS Take 4,000 Int'l Units by mouth.  . citalopram (CELEXA) 40 MG tablet TAKE 1 TABLET ONCE DAILY.  Marland Kitchen glucose blood (ACCU-CHEK COMPACT STRIPS) test strip Use as instructed   . insulin NPH-regular Human (NOVOLIN 70/30) (70-30) 100 UNIT/ML injection As directed  . levothyroxine (SYNTHROID, LEVOTHROID) 137 MCG tablet TAKE 1 TABLET EACH DAY.  Marland Kitchen losartan-hydrochlorothiazide (HYZAAR) 100-25 MG tablet Take 1 tablet by mouth every morning.  . metFORMIN (GLUCOPHAGE XR) 500 MG 24 hr tablet Take 2 tablets 2 x / day for Diabetes  . MILK THISTLE PO Take 280 mg by mouth.  Marland Kitchen omeprazole (PRILOSEC) 20 MG capsule TAKE (1) CAPSULE DAILY.  Marland Kitchen Continuous Blood Gluc Sensor (FREESTYLE LIBRE SENSOR SYSTEM) MISC Check blood sugar 3 times a day-[id number]   No Known Allergies   Past Medical History:  Diagnosis Date  . Anxiety   . ASTHMA 10/28/2006  . Cough   . Diabetes mellitus type II   . GERD (gastroesophageal reflux disease)   . Hyperlipidemia   . Hypertension    DR TODD PCP,PT HAS NOT SEEN >3 YRS  . Hypothyroidism   . Lumbar back pain   . NASH (nonalcoholic steatohepatitis)   . Numbness   . OSA (obstructive sleep apnea) 11/19/2010  . Sleep apnea    Pt states mild case, doesn't use cpap   Review of  Systems  10 point systems review negative except as above.    Objective:   Physical Exam  BP (!) 138/98   Pulse 76   Temp (!) 97.5 F (36.4 C)   Resp 18   Ht 5' 11.5" (1.816 m)   Wt 230 lb 9.6 oz (104.6 kg)   BMI 31.71 kg/m   HEENT - WNL. Neck - supple.  Chest - Clear equal BS. Cor - Nl HS. RRR w/o sig MGR. PP 1(+). No edema. MS- FROM w/o deformities.  Gait Nl. Neuro -  Nl w/o focal abnormalities.    Assessment & Plan:   1. Essential hypertension  2. Diabetes mellitus type 2, insulin dependent ([place])  - long discussion re: prudent Diabetic diet and weight    - encouraged bid CBG monitoring

## 2017-04-21 ENCOUNTER — Other Ambulatory Visit: Payer: Self-pay | Admitting: Internal Medicine

## 2017-04-21 DIAGNOSIS — Z794 Long term (current) use of insulin: Principal | ICD-10-CM

## 2017-04-21 DIAGNOSIS — E119 Type 2 diabetes mellitus without complications: Secondary | ICD-10-CM

## 2017-05-23 ENCOUNTER — Other Ambulatory Visit: Payer: Self-pay | Admitting: Physician Assistant

## 2017-05-23 ENCOUNTER — Other Ambulatory Visit: Payer: Self-pay | Admitting: Internal Medicine

## 2017-05-23 DIAGNOSIS — Z794 Long term (current) use of insulin: Principal | ICD-10-CM

## 2017-05-23 DIAGNOSIS — E119 Type 2 diabetes mellitus without complications: Secondary | ICD-10-CM

## 2017-06-12 ENCOUNTER — Encounter: Payer: Self-pay | Admitting: Internal Medicine

## 2017-06-12 ENCOUNTER — Ambulatory Visit: Payer: Managed Care, Other (non HMO) | Admitting: Internal Medicine

## 2017-06-12 VITALS — BP 124/82 | HR 80 | Temp 97.7°F | Resp 16 | Ht 71.5 in | Wt 231.6 lb

## 2017-06-12 DIAGNOSIS — I10 Essential (primary) hypertension: Secondary | ICD-10-CM

## 2017-06-12 DIAGNOSIS — E119 Type 2 diabetes mellitus without complications: Secondary | ICD-10-CM

## 2017-06-12 DIAGNOSIS — E039 Hypothyroidism, unspecified: Secondary | ICD-10-CM

## 2017-06-12 DIAGNOSIS — E782 Mixed hyperlipidemia: Secondary | ICD-10-CM

## 2017-06-12 DIAGNOSIS — Z794 Long term (current) use of insulin: Secondary | ICD-10-CM | POA: Diagnosis not present

## 2017-06-12 DIAGNOSIS — M7062 Trochanteric bursitis, left hip: Secondary | ICD-10-CM

## 2017-06-12 DIAGNOSIS — E559 Vitamin D deficiency, unspecified: Secondary | ICD-10-CM

## 2017-06-12 DIAGNOSIS — Z79899 Other long term (current) drug therapy: Secondary | ICD-10-CM | POA: Diagnosis not present

## 2017-06-12 MED ORDER — DEXAMETHASONE SODIUM PHOSPHATE 10 MG/ML IJ SOLN: 20.0000 mg | Freq: Once | INTRAMUSCULAR | Status: DC

## 2017-06-12 NOTE — Patient Instructions (Signed)

## 2017-06-12 NOTE — Progress Notes (Signed)
This very nice 52 y.o. MWM presents for 3 month follow up with HTN, HLD, Insulin requiring T2_DM and Vitamin D Deficiency. He's also c/o limiting pain Lt hip with  Much walking.      Patient is treated for HTN since 43 (age 34 yo)  & BP has been controlled at home. Today's BP is at goal - 124/82. Patient has had no complaints of any cardiac type chest pain, palpitations, dyspnea / orthopnea / PND, dizziness, claudication, or dependent edema.     Hyperlipidemia is controlled with diet & meds. Patient denies myalgias or other med SE's. Last Lipids were  Lab Results  Component Value Date   CHOL 227 (H) 02/20/2017   HDL 52 02/20/2017   LDLCALC 108 (H) 09/20/2016   LDLDIRECT 120.3 11/07/2006   TRIG 262 (H) 02/20/2017   CHOLHDL 4.4 02/20/2017      Also, the patient has history of  T2_DM  Since age 42 (2000)  and has had no symptoms of reactive hypoglycemia, diabetic polys, paresthesias or visual blurring.   He has been transitioned off of Lantus,/Novolog, Jardiance and Trulicity (approximating $2,34month) To bid dosing on Novolin 70/30 mix (approximating $20/mo)  Last A1c on his former regimen was not at goal: Lab Results  Component Value Date   HGBA1C 10.5 (H) 02/20/2017      Patient has been on thyroid replacement since thyroidectomy in 2002 for Goiter. Further, the patient also has history of Vitamin D Deficiency and supplements vitamin D without any suspected side-effects. Last vitamin D was very low:  Lab Results  Component Value Date   VD25OH 27 (L) 02/20/2017   Current Outpatient Medications on File Prior to Visit  Medication Sig  . aspirin 81 MG tablet Take 81 mg by mouth daily.    Marland Kitchen atorvastatin (LIPITOR) 80 MG tablet TAKE ONE TABLET AT BEDTIME.  Marland Kitchen Cholecalciferol (VITAMIN D3) 3000 units TABS Take 4,000 Int'l Units by mouth.  . citalopram (CELEXA) 40 MG tablet TAKE 1 TABLET ONCE DAILY.  Marland Kitchen glucose blood (ACCU-CHEK COMPACT STRIPS) test strip Use as instructed   . insulin  NPH-regular Human (NOVOLIN 70/30) (70-30) 100 UNIT/ML injection As directed  . levothyroxine (SYNTHROID, LEVOTHROID) 137 MCG tablet TAKE 1 TABLET EACH DAY.  Marland Kitchen losartan-hydrochlorothiazide (HYZAAR) 100-25 MG tablet Take 1 tablet by mouth every morning.  . metFORMIN (GLUCOPHAGE-XR) 500 MG 24 hr tablet TAKE 2 TABLET TWICE DAILY FOR DIABETES  . MILK THISTLE PO Take 280 mg by mouth.  Marland Kitchen omeprazole (PRILOSEC) 20 MG capsule TAKE (1) CAPSULE DAILY.   No current facility-administered medications on file prior to visit.    No Known Allergies PMHx:   Past Medical History:  Diagnosis Date  . Anxiety   . ASTHMA 10/28/2006  . Cough   . Diabetes mellitus type II 2000   age 6  . GERD (gastroesophageal reflux disease)   . Hyperlipidemia   . Hypertension 1995  . Hypothyroidism 2002   thyroidectomy for Goiter  . Lumbar back pain   . NASH (nonalcoholic steatohepatitis)   . Numbness   . OSA (obstructive sleep apnea) 11/19/2010  . Sleep apnea    Pt states mild case, doesn't use cpap   Immunization History  Administered Date(s) Administered  . Influenza Inj Mdck Quad With Preservative 02/20/2017  . Influenza Split 01/16/2014  . Tdap 08/10/2015   Past Surgical History:  Procedure Laterality Date  . bilateral shoulder surgery    . right knee repair  1997  .  THYROIDECTOMY     right  . TRIGGER FINGER RELEASE    . TRIGGER FINGER RELEASE  02/23/2011   Procedure: RELEASE TRIGGER FINGER/A-1 PULLEY;  Surgeon: Meredith Pel;  Location: [place];  Service: Orthopedics;  Laterality: Right;  Right 4th trigger finger release   FHx:    Reviewed / unchanged  SHx:    Reviewed / unchanged  Systems Review:  Constitutional: Denies fever, chills, wt changes, headaches, insomnia, fatigue, night sweats, change in appetite. Eyes: Denies redness, blurred vision, diplopia, discharge, itchy, watery eyes.  ENT: Denies discharge, congestion, post nasal drip, epistaxis, sore throat, earache, hearing loss, dental  pain, tinnitus, vertigo, sinus pain, snoring.  CV: Denies chest pain, palpitations, irregular heartbeat, syncope, dyspnea, diaphoresis, orthopnea, PND, claudication or edema. Respiratory: denies cough, dyspnea, DOE, pleurisy, hoarseness, laryngitis, wheezing.  Gastrointestinal: Denies dysphagia, odynophagia, heartburn, reflux, water brash, abdominal pain or cramps, nausea, vomiting, bloating, diarrhea, constipation, hematemesis, melena, hematochezia  or hemorrhoids. Genitourinary: Denies dysuria, frequency, urgency, nocturia, hesitancy, discharge, hematuria or flank pain. Musculoskeletal: Denies arthralgias, myalgias, stiffness, jt. swelling, limping or strain/sprain. Has pain Lt hip.  Skin: Denies pruritus, rash, hives, warts, acne, eczema or change in skin lesion(s). Neuro: No weakness, tremor, incoordination, spasms, paresthesia or pain. Psychiatric: Denies confusion, memory loss or sensory loss. Endo: Denies change in weight, skin or hair change.  Heme/Lymph: No excessive bleeding, bruising or enlarged lymph nodes.  Physical Exam  BP 124/82   Pulse 80   Temp 97.7 F (36.5 C)   Resp 16   Ht 5' 11.5" (1.816 m)   Wt 231 lb 9.6 oz (105.1 kg)   BMI 31.85 kg/m   Appears  well nourished, well groomed  and in no distress.  Eyes: PERRLA, EOMs, conjunctiva no swelling or erythema. Sinuses: No frontal/maxillary tenderness ENT/Mouth: EAC's clear, TM's nl w/o erythema, bulging. Nares clear w/o erythema, swelling, exudates. Oropharynx clear without erythema or exudates. Oral hygiene is good. Tongue normal, non obstructing. Hearing intact.  Neck: Supple. Thyroid not palpable. Car 2+/2+ without bruits, nodes or JVD. Chest: Respirations nl with BS clear & equal w/o rales, rhonchi, wheezing or stridor.  Cor: Heart sounds normal w/ regular rate and rhythm without sig. murmurs, gallops, clicks or rubs. Peripheral pulses normal and equal  without edema.  Abdomen: Soft & bowel sounds normal. Non-tender  w/o guarding, rebound, hernias, masses or organomegaly.  Lymphatics: Unremarkable.  Musculoskeletal: Full ROM all peripheral extremities, joint stability, 5/5 strength and normal gait. (+) Exquisite pain over Lt hip Gr trochanteric bursa.   After informed consent and aseptic prep with alcohol the Lt Hip Gr. Trochanteric bursa was infiltrated with 1 cc Marcaine 0.5% and 2 ml Dexamethasone (20 mg)   Skin: Warm, dry without exposed rashes, lesions or ecchymosis apparent.  Neuro: Cranial nerves intact, reflexes equal bilaterally. Sensory-motor testing grossly intact. Tendon reflexes grossly intact.  Pysch: Alert & oriented x 3.  Insight and judgement nl & appropriate. No ideations.  Assessment and Plan:  - Continue medication, monitor blood pressure at home.  - Continue DASH diet. Reminder to go to the ER if any CP,  SOB, nausea, dizziness, severe HA, changes vision/speech.  - Continue diet/meds, exercise,& lifestyle modifications.  - Continue monitor periodic cholesterol/liver & renal functions   - Continue diet, exercise, lifestyle modifications.  - Monitor appropriate labs. - Continue supplementation.      Discussed  regular exercise, BP monitoring, weight control to achieve/maintain BMI less than 25 and discussed med and SE's. Recommended labs to assess  and monitor clinical status with further disposition pending results of labs. Over 30 minutes of exam, counseling, chart review was performed.

## 2017-06-13 LAB — VITAMIN D 25 HYDROXY (VIT D DEFICIENCY, FRACTURES): Vit D, 25-Hydroxy: 31 ng/mL (ref 30–100)

## 2017-06-13 LAB — LIPID PANEL
Cholesterol: 185 mg/dL (ref <200)
HDL: 54 mg/dL (ref >40)
LDL Cholesterol (Calc): 102 mg/dL (calc) — ABNORMAL HIGH
Non-HDL Cholesterol (Calc): 131 mg/dL (calc) — ABNORMAL HIGH (ref <130)
Total CHOL/HDL Ratio: 3.4 (calc) (ref <5.0)
Triglycerides: 169 mg/dL — ABNORMAL HIGH (ref <150)

## 2017-06-13 LAB — CBC WITH DIFFERENTIAL/PLATELET
BASOS ABS: 111 {cells}/uL (ref 0–200)
Basophils Relative: 1.1 %
EOS PCT: 2.9 %
Eosinophils Absolute: 293 cells/uL (ref 15–500)
HEMATOCRIT: 45.4 % (ref 38.5–50.0)
Hemoglobin: 16.4 g/dL (ref 13.2–17.1)
Lymphs Abs: 2687 cells/uL (ref 850–3900)
MCH: 32.6 pg (ref 27.0–33.0)
MCHC: 36.1 g/dL — ABNORMAL HIGH (ref 32.0–36.0)
MCV: 90.3 fL (ref 80.0–100.0)
MPV: 10.8 fL (ref 7.5–12.5)
Monocytes Relative: 8.6 %
NEUTROS PCT: 60.8 %
Neutro Abs: 6141 cells/uL (ref 1500–7800)
PLATELETS: 246 10*3/uL (ref 140–400)
RBC: 5.03 10*6/uL (ref 4.20–5.80)
RDW: 12.2 % (ref 11.0–15.0)
TOTAL LYMPHOCYTE: 26.6 %
WBC mixed population: 869 cells/uL (ref 200–950)
WBC: 10.1 10*3/uL (ref 3.8–10.8)

## 2017-06-13 LAB — HEPATIC FUNCTION PANEL
AG Ratio: 1.4 (calc) (ref 1.0–2.5)
ALT: 46 U/L (ref 9–46)
AST: 31 U/L (ref 10–35)
Albumin: 4.8 g/dL (ref 3.6–5.1)
Alkaline phosphatase (APISO): 89 U/L (ref 40–115)
BILIRUBIN DIRECT: 0.2 mg/dL (ref 0.0–0.2)
BILIRUBIN INDIRECT: 0.4 mg/dL (ref 0.2–1.2)
GLOBULIN: 3.5 g/dL (ref 1.9–3.7)
Total Bilirubin: 0.6 mg/dL (ref 0.2–1.2)
Total Protein: 8.3 g/dL — ABNORMAL HIGH (ref 6.1–8.1)

## 2017-06-13 LAB — BASIC METABOLIC PANEL WITH GFR
BUN: 18 mg/dL (ref 7–25)
CHLORIDE: 100 mmol/L (ref 98–110)
CO2: 29 mmol/L (ref 20–32)
Calcium: 9.8 mg/dL (ref 8.6–10.3)
Creat: 0.98 mg/dL (ref 0.70–1.33)
GFR, EST AFRICAN AMERICAN: 101 mL/min/{1.73_m2} (ref >=60)
GFR, Est Non African American: 87 mL/min/{1.73_m2} (ref >=60)
Glucose, Bld: 195 mg/dL — ABNORMAL HIGH (ref 65–99)
POTASSIUM: 4.5 mmol/L (ref 3.5–5.3)
SODIUM: 139 mmol/L (ref 135–146)

## 2017-06-13 LAB — MAGNESIUM: MAGNESIUM: 2 mg/dL (ref 1.5–2.5)

## 2017-06-13 LAB — HEMOGLOBIN A1C
HEMOGLOBIN A1C: 10.2 %{Hb} — AB (ref <5.7)
Mean Plasma Glucose: 246 (calc)
eAG (mmol/L): 13.6 (calc)

## 2017-06-13 LAB — TSH: TSH: 3.43 m[IU]/L (ref 0.40–4.50)

## 2017-06-15 ENCOUNTER — Other Ambulatory Visit: Payer: Self-pay | Admitting: *Deleted

## 2017-06-15 MED ORDER — BLOOD GLUCOSE MONITOR KIT: PACK | 0 refills | Status: AC

## 2017-06-22 ENCOUNTER — Other Ambulatory Visit: Payer: Self-pay | Admitting: Physician Assistant

## 2017-06-22 DIAGNOSIS — Z794 Long term (current) use of insulin: Principal | ICD-10-CM

## 2017-06-22 DIAGNOSIS — E119 Type 2 diabetes mellitus without complications: Secondary | ICD-10-CM

## 2017-08-16 ENCOUNTER — Other Ambulatory Visit: Payer: Self-pay | Admitting: Internal Medicine

## 2017-09-13 ENCOUNTER — Encounter: Payer: Self-pay | Admitting: Internal Medicine

## 2017-09-17 ENCOUNTER — Other Ambulatory Visit: Payer: Self-pay | Admitting: Internal Medicine

## 2017-09-17 DIAGNOSIS — E119 Type 2 diabetes mellitus without complications: Secondary | ICD-10-CM

## 2017-09-17 DIAGNOSIS — Z794 Long term (current) use of insulin: Principal | ICD-10-CM

## 2017-09-26 ENCOUNTER — Encounter: Payer: Self-pay | Admitting: Internal Medicine

## 2017-10-11 ENCOUNTER — Ambulatory Visit: Payer: Managed Care, Other (non HMO) | Admitting: Internal Medicine

## 2017-10-11 ENCOUNTER — Encounter: Payer: Self-pay | Admitting: Internal Medicine

## 2017-10-11 VITALS — BP 126/80 | HR 88 | Temp 97.3°F | Resp 18 | Ht 71.5 in | Wt 230.2 lb

## 2017-10-11 DIAGNOSIS — I10 Essential (primary) hypertension: Secondary | ICD-10-CM

## 2017-10-11 DIAGNOSIS — Z136 Encounter for screening for cardiovascular disorders: Secondary | ICD-10-CM

## 2017-10-11 DIAGNOSIS — Z1212 Encounter for screening for malignant neoplasm of rectum: Secondary | ICD-10-CM

## 2017-10-11 DIAGNOSIS — E039 Hypothyroidism, unspecified: Secondary | ICD-10-CM

## 2017-10-11 DIAGNOSIS — E119 Type 2 diabetes mellitus without complications: Secondary | ICD-10-CM

## 2017-10-11 DIAGNOSIS — E559 Vitamin D deficiency, unspecified: Secondary | ICD-10-CM

## 2017-10-11 DIAGNOSIS — Z130 Encounter for screening for diseases of the blood and blood-forming organs and certain disorders involving the immune mechanism: Secondary | ICD-10-CM

## 2017-10-11 DIAGNOSIS — R350 Frequency of micturition: Secondary | ICD-10-CM

## 2017-10-11 DIAGNOSIS — Z131 Encounter for screening for diabetes mellitus: Secondary | ICD-10-CM

## 2017-10-11 DIAGNOSIS — R5383 Other fatigue: Secondary | ICD-10-CM

## 2017-10-11 DIAGNOSIS — Z79899 Other long term (current) drug therapy: Secondary | ICD-10-CM

## 2017-10-11 DIAGNOSIS — Z125 Encounter for screening for malignant neoplasm of prostate: Secondary | ICD-10-CM | POA: Diagnosis not present

## 2017-10-11 DIAGNOSIS — Z1389 Encounter for screening for other disorder: Secondary | ICD-10-CM | POA: Diagnosis not present

## 2017-10-11 DIAGNOSIS — Z13220 Encounter for screening for lipoid disorders: Secondary | ICD-10-CM

## 2017-10-11 DIAGNOSIS — G4733 Obstructive sleep apnea (adult) (pediatric): Secondary | ICD-10-CM

## 2017-10-11 DIAGNOSIS — Z0000 Encounter for general adult medical examination without abnormal findings: Secondary | ICD-10-CM | POA: Diagnosis not present

## 2017-10-11 DIAGNOSIS — Z1329 Encounter for screening for other suspected endocrine disorder: Secondary | ICD-10-CM

## 2017-10-11 DIAGNOSIS — Z8249 Family history of ischemic heart disease and other diseases of the circulatory system: Secondary | ICD-10-CM

## 2017-10-11 DIAGNOSIS — E782 Mixed hyperlipidemia: Secondary | ICD-10-CM

## 2017-10-11 DIAGNOSIS — Z111 Encounter for screening for respiratory tuberculosis: Secondary | ICD-10-CM

## 2017-10-11 DIAGNOSIS — Z794 Long term (current) use of insulin: Secondary | ICD-10-CM

## 2017-10-11 DIAGNOSIS — E349 Endocrine disorder, unspecified: Secondary | ICD-10-CM

## 2017-10-11 DIAGNOSIS — N401 Enlarged prostate with lower urinary tract symptoms: Secondary | ICD-10-CM | POA: Diagnosis not present

## 2017-10-11 DIAGNOSIS — Z0001 Encounter for general adult medical examination with abnormal findings: Secondary | ICD-10-CM

## 2017-10-11 DIAGNOSIS — Z1211 Encounter for screening for malignant neoplasm of colon: Secondary | ICD-10-CM

## 2017-10-11 NOTE — Progress Notes (Addendum)
[facility]   Unk Pinto, M.D.     Uvaldo Bristle. Silverio Lay, P.A.-C Liane Comber, Ramona                [address] [place], [place] [address] Telephone [phone] Telefax [phone] Annual  Screening/Preventative Visit  & Comprehensive Evaluation & Examination     This very nice 52 y.o. MWM  presents for a Screening /Preventative Visit & comprehensive evaluation and management of multiple medical co-morbidities.  Patient has been followed for HTN, HLD, insReqT2_DM  and Vitamin D Deficiency.     HTN predates circa 73 (age 82). Patient's BP has been controlled at home.  Today's BP is at goal - 126/80. Patient denies any cardiac symptoms as chest pain, palpitations, shortness of breath, dizziness or ankle swelling.     Patient's hyperlipidemia is not controlled with diet and medications. Patient denies myalgias or other medication SE's. Last lipids were not at goal:  Lab Results  Component Value Date   CHOL 217 (H) 10/11/2017   HDL 59 10/11/2017   LDLCALC 132 (H) 10/11/2017   LDLDIRECT 120.3 11/07/2006   TRIG 145 10/11/2017   CHOLHDL 3.7 10/11/2017      Patient has T2_NIDDM dx'd age 41 circa 2000 and compliance historically has been poor.  Patient had been on a $2000 / month regimen of Lantus/Novolog, Jardiance & Trulicity and was switched to Novolin 70/30 w/MF at about $20/month. Patient admits poor dietary compliance and also frequently forgetting to take his 2sd dose of Nonolin 70/30. patient denies reactive hypoglycemic symptoms, visual blurring, diabetic polys or paresthesias. Last A1c was not at goal consistent with his poor compliance.  Lab Results  Component Value Date   HGBA1C 9.4 (H) 10/11/2017          Patient has been on thyroid replacement since thyroidectomy in 2002 for Goiter.     Finally, patient has history of Vitamin D Deficiency ("20"/May2017) and last vitamin D was  still not at goal: Lab Results  Component Value Date   VD25OH 32 10/11/2017   Current Outpatient Medications on File Prior to Visit  Medication Sig  . aspirin 81 MG tablet Take 81 mg by mouth daily.    Marland Kitchen atorvastatin (LIPITOR) 80 MG tablet TAKE ONE TABLET AT BEDTIME.  . blood glucose meter kit and supplies KIT Dispense based on patient and insurance preference. Use up to 3 times daily as directed. (FOR ICD-10-E11.9 and Z79.4).  . citalopram (CELEXA) 40 MG tablet TAKE 1 TABLET ONCE DAILY.  Marland Kitchen insulin NPH-regular Human (NOVOLIN 70/30) (70-30) 100 UNIT/ML injection As directed  . JARDIANCE 25 MG TABS tablet TAKE 1 TABLET BY MOUTH DAILY.  Marland Kitchen levothyroxine (SYNTHROID, LEVOTHROID) 137 MCG tablet TAKE 1 TABLET EACH DAY.  Marland Kitchen losartan-hydrochlorothiazide (HYZAAR) 100-25 MG tablet TAKE 1/2 TO 1 TABLET EACH MORNING.  . metFORMIN (GLUCOPHAGE-XR) 500 MG 24 hr tablet TAKE 2 TABLET TWICE DAILY FOR DIABETES  . omeprazole (PRILOSEC) 20 MG capsule TAKE (1) CAPSULE DAILY.  Marland Kitchen Cholecalciferol (VITAMIN D3) 3000 units TABS Take 4,000 Int'l Units by mouth.  Marland Kitchen MILK THISTLE PO Take 280 mg by mouth.   Current Facility-Administered Medications on File Prior to Visit  Medication  . dexamethasone (DECADRON) injection 20 mg   No Known Allergies   Past Medical History:  Diagnosis Date  . Anxiety   . ASTHMA 10/28/2006  .  Cough   . Diabetes mellitus type II 2000   age 35  . GERD (gastroesophageal reflux disease)   . Hyperlipidemia   . Hypertension 1995  . Hypothyroidism 2002   thyroidectomy for Goiter  . Lumbar back pain   . NASH (nonalcoholic steatohepatitis)   . Numbness   . OSA (obstructive sleep apnea) 11/19/2010  . Sleep apnea    Pt states mild case, doesn't use cpap   Health Maintenance  Topic Date Due  . OPHTHALMOLOGY EXAM  06/01/1973  . HIV Screening  06/01/1978  . PNEUMOCOCCAL POLYSACCHARIDE VACCINE (2) 08/16/2017  . INFLUENZA VACCINE  11/02/2017  . HEMOGLOBIN A1C  04/13/2018  . FOOT EXAM   10/12/2018  . COLONOSCOPY  09/23/2020  . TETANUS/TDAP  08/09/2025  . Hepatitis C Screening  Completed   Immunization History  Administered Date(s) Administered  . Influenza Inj Mdck Quad With Preservative 02/20/2017  . Influenza Split 01/16/2014  . PPD Test 10/11/2017  . Pneumococcal Polysaccharide-23 08/16/2012  . Tdap 08/10/2015   Last Colon - 09/24/2015 - Dr Fuller Plan - recc f/u 5 yr - June 2022  Past Surgical History:  Procedure Laterality Date  . bilateral shoulder surgery    . right knee repair  1997  . THYROIDECTOMY     right  . TRIGGER FINGER RELEASE    . TRIGGER FINGER RELEASE  02/23/2011   Procedure: RELEASE TRIGGER FINGER/A-1 PULLEY;  Surgeon: Meredith Pel;  Location: [facility];  Service: Orthopedics;  Laterality: Right;  Right 4th trigger finger release   Family History  Problem Relation Age of Onset  . Osteoarthritis Father   . Heart attack Father   . Skin cancer Father   . Breast cancer Mother   . Colon cancer Neg Hx   . Esophageal cancer Neg Hx   . Rectal cancer Neg Hx   . Stomach cancer Neg Hx    Social History   Socioeconomic History  . Marital status: Married    Spouse name: Corporate treasurer  . Number of children: 2  Occupational History  . Occupation: Barrister's clerk  Tobacco Use  . Smoking status: Never Smoker  . Smokeless tobacco: Never Used  Substance and Sexual Activity  . Alcohol use: Yes    Alcohol/week: 0.0 oz    Comment: 20 beers per week  . Drug use: No  . Sexual activity: Not on file    ROS Constitutional: Denies fever, chills, weight loss/gain, headaches, insomnia,  night sweats or change in appetite. Does c/o fatigue. Eyes: Denies redness, blurred vision, diplopia, discharge, itchy or watery eyes.  ENT: Denies discharge, congestion, post nasal drip, epistaxis, sore throat, earache, hearing loss, dental pain, Tinnitus, Vertigo, Sinus pain or snoring.  Cardio: Denies chest pain, palpitations, irregular heartbeat, syncope, dyspnea,  diaphoresis, orthopnea, PND, claudication or edema Respiratory: denies cough, dyspnea, DOE, pleurisy, hoarseness, laryngitis or wheezing.  Gastrointestinal: Denies dysphagia, heartburn, reflux, water brash, pain, cramps, nausea, vomiting, bloating, diarrhea, constipation, hematemesis, melena, hematochezia, jaundice or hemorrhoids Genitourinary: Denies dysuria, frequency, urgency, nocturia, hesitancy, discharge, hematuria or flank pain Musculoskeletal: Denies arthralgia, myalgia, stiffness, Jt. Swelling, pain, limp or strain/sprain. Denies Falls. Skin: Denies puritis, rash, hives, warts, acne, eczema or change in skin lesion Neuro: No weakness, tremor, incoordination, spasms, paresthesia or pain Psychiatric: Denies confusion, memory loss or sensory loss. Denies Depression. Endocrine: Denies change in weight, skin, hair change, nocturia, and paresthesia, diabetic polys, visual blurring or hyper / hypo glycemic episodes.  Heme/Lymph: No excessive bleeding, bruising or enlarged lymph  nodes.  Physical Exam  BP 126/80   Pulse 88   Temp (!) 97.3 F (36.3 C)   Resp 18   Ht 5' 11.5" (1.816 m)   Wt 230 lb 3.2 oz (104.4 kg)   BMI 31.66 kg/m   General Appearance: Well nourished and well groomed and in no apparent distress.  Eyes: PERRLA, EOMs, conjunctiva no swelling or erythema, normal fundi and vessels. Sinuses: No frontal/maxillary tenderness ENT/Mouth: EACs patent / TMs  nl. Nares clear without erythema, swelling, mucoid exudates. Oral hygiene is good. No erythema, swelling, or exudate. Tongue normal, non-obstructing. Tonsils not swollen or erythematous. Hearing normal.  Neck: Supple, thyroid not palpable. No bruits, nodes or JVD. Respiratory: Respiratory effort normal.  BS equal and clear bilateral without rales, rhonci, wheezing or stridor. Cardio: Heart sounds are normal with regular rate and rhythm and no murmurs, rubs or gallops. Peripheral pulses are normal and equal bilaterally without  edema. No aortic or femoral bruits. Chest: symmetric with normal excursions and percussion.  Abdomen: Soft, with Nl bowel sounds. Nontender, no guarding, rebound, hernias, masses, or organomegaly.  Lymphatics: Non tender without lymphadenopathy.  Genitourinary: No hernias.Testes nl. DRE - prostate nl for age - smooth & firm w/o nodules. Musculoskeletal: Full ROM all peripheral extremities, joint stability, 5/5 strength, and normal gait. Skin: Warm and dry without rashes, lesions, cyanosis, clubbing or  ecchymosis.  Neuro: Cranial nerves intact, reflexes equal bilaterally. Normal muscle tone, no cerebellar symptoms. Sensation intact to touch, vibratory and Monofilament to the toes bilaterally. Pysch: Alert and oriented X 3 with normal affect, insight and judgment appropriate.   Assessment and Plan  1. Annual Preventative/Screening Exam   1. Encounter for general adult medical examination with abnormal findings  2. Essential hypertension  - EKG 12-Lead - Urinalysis, Routine w reflex microscopic - Microalbumin / creatinine urine ratio - CBC with Differential/Platelet - COMPLETE METABOLIC PANEL WITH GFR - Magnesium - TSH - [place], RETROPERITNL ABD,  LTD  3. Hyperlipidemia, mixed - EKG 12-Lead - Lipid panel - TSH - [place], RETROPERITNL ABD,  LTD  4. Diabetes mellitus type 2, insulin dependent (HCC)  - EKG 12-Lead - Urinalysis, Routine w reflex microscopic - Microalbumin / creatinine urine ratio - HM DIABETES FOOT EXAM - LOW EXTREMITY NEUR EXAM DOCUM - Hemoglobin A1c - [place], RETROPERITNL ABD,  LTD  5. Vitamin D deficiency - VITAMIN D 25 Hydroxy  6. Hypothyroidism, unspecified type  - TSH  7. Testosterone deficiency  - Testosterone  8. Screening for colorectal cancer  - POC Hemoccult Bld/Stl  9. Screening for prostate cancer  - PSA  10. Screening for ischemic heart disease  - EKG 12-Lead  11. FHx: heart disease  - EKG 12-Lead - [place], RETROPERITNL ABD,  LTD  12.  Screening for AAA (aortic abdominal aneurysm)  - [place], RETROPERITNL ABD,  LTD  13. Fatigue  - Iron,Total/Total Iron Binding Cap - Vitamin B12 - Testosterone - CBC with Differential/Platelet - TSH  14. Medication management  - Urinalysis, Routine w reflex microscopic - Microalbumin / creatinine urine ratio - CBC with Differential/Platelet  15. Screening examination for pulmonary tuberculosis  - PPD       Patient was counseled in prudent diet, weight control to achieve/maintain BMI less than 25, BP monitoring, regular exercise and medications as discussed.  Discussed med effects and SE's. Routine screening labs and tests as requested with regular follow-up as recommended. Over 40 minutes of exam, counseling, chart review and high complex critical decision making was performed

## 2017-10-11 NOTE — Patient Instructions (Signed)

## 2017-10-12 LAB — CBC WITH DIFFERENTIAL/PLATELET
BASOS ABS: 66 {cells}/uL (ref 0–200)
Basophils Relative: 0.8 %
EOS PCT: 2.9 %
Eosinophils Absolute: 241 cells/uL (ref 15–500)
HEMATOCRIT: 47.2 % (ref 38.5–50.0)
Hemoglobin: 16.7 g/dL (ref 13.2–17.1)
LYMPHS ABS: 2341 {cells}/uL (ref 850–3900)
MCH: 32.6 pg (ref 27.0–33.0)
MCHC: 35.4 g/dL (ref 32.0–36.0)
MCV: 92.2 fL (ref 80.0–100.0)
MPV: 11 fL (ref 7.5–12.5)
Monocytes Relative: 9.3 %
NEUTROS ABS: 4880 {cells}/uL (ref 1500–7800)
Neutrophils Relative %: 58.8 %
PLATELETS: 212 10*3/uL (ref 140–400)
RBC: 5.12 10*6/uL (ref 4.20–5.80)
RDW: 12.4 % (ref 11.0–15.0)
Total Lymphocyte: 28.2 %
WBC: 8.3 10*3/uL (ref 3.8–10.8)
WBCMIX: 772 {cells}/uL (ref 200–950)

## 2017-10-12 LAB — COMPLETE METABOLIC PANEL WITH GFR
AG Ratio: 1.6 (calc) (ref 1.0–2.5)
ALT: 37 U/L (ref 9–46)
AST: 26 U/L (ref 10–35)
Albumin: 5.1 g/dL (ref 3.6–5.1)
Alkaline phosphatase (APISO): 82 U/L (ref 40–115)
BUN: 20 mg/dL (ref 7–25)
CALCIUM: 9.8 mg/dL (ref 8.6–10.3)
CO2: 24 mmol/L (ref 20–32)
CREATININE: 0.91 mg/dL (ref 0.70–1.33)
Chloride: 101 mmol/L (ref 98–110)
GFR, Est African American: 110 mL/min/{1.73_m2} (ref >=60)
GFR, Est Non African American: 95 mL/min/{1.73_m2} (ref >=60)
GLOBULIN: 3.2 g/dL (ref 1.9–3.7)
GLUCOSE: 157 mg/dL — AB (ref 65–99)
Potassium: 3.8 mmol/L (ref 3.5–5.3)
SODIUM: 138 mmol/L (ref 135–146)
Total Bilirubin: 0.8 mg/dL (ref 0.2–1.2)
Total Protein: 8.3 g/dL — ABNORMAL HIGH (ref 6.1–8.1)

## 2017-10-12 LAB — URINALYSIS, ROUTINE W REFLEX MICROSCOPIC
Bilirubin Urine: NEGATIVE
HGB URINE DIPSTICK: NEGATIVE
Ketones, ur: NEGATIVE
Leukocytes, UA: NEGATIVE
Nitrite: NEGATIVE
PH: 7.5 (ref 5.0–8.0)
PROTEIN: NEGATIVE
Specific Gravity, Urine: 1.027 (ref 1.001–1.03)

## 2017-10-12 LAB — TSH: TSH: 10.23 mIU/L — ABNORMAL HIGH (ref 0.40–4.50)

## 2017-10-12 LAB — LIPID PANEL
CHOL/HDL RATIO: 3.7 (calc) (ref <5.0)
CHOLESTEROL: 217 mg/dL — AB (ref <200)
HDL: 59 mg/dL (ref >40)
LDL CHOLESTEROL (CALC): 132 mg/dL — AB
Non-HDL Cholesterol (Calc): 158 mg/dL (calc) — ABNORMAL HIGH (ref <130)
TRIGLYCERIDES: 145 mg/dL (ref <150)

## 2017-10-12 LAB — HEMOGLOBIN A1C
HEMOGLOBIN A1C: 9.4 %{Hb} — AB (ref <5.7)
Mean Plasma Glucose: 223 (calc)
eAG (mmol/L): 12.4 (calc)

## 2017-10-12 LAB — VITAMIN D 25 HYDROXY (VIT D DEFICIENCY, FRACTURES): VIT D 25 HYDROXY: 32 ng/mL (ref 30–100)

## 2017-10-12 LAB — IRON, TOTAL/TOTAL IRON BINDING CAP
%SAT: 33 % (ref 20–48)
Iron: 128 ug/dL (ref 50–180)
TIBC: 386 ug/dL (ref 250–425)

## 2017-10-12 LAB — MICROALBUMIN / CREATININE URINE RATIO
Creatinine, Urine: 37 mg/dL (ref 20–320)
MICROALB UR: 2.5 mg/dL
MICROALB/CREAT RATIO: 68 ug/mg{creat} — AB (ref <30)

## 2017-10-12 LAB — VITAMIN B12: Vitamin B-12: 426 pg/mL (ref 200–1100)

## 2017-10-12 LAB — PSA: PSA: 0.6 ng/mL (ref <=4.0)

## 2017-10-12 LAB — MAGNESIUM: MAGNESIUM: 2.1 mg/dL (ref 1.5–2.5)

## 2017-10-12 LAB — TESTOSTERONE: TESTOSTERONE: 383 ng/dL (ref 250–827)

## 2017-10-15 ENCOUNTER — Encounter: Payer: Self-pay | Admitting: Internal Medicine

## 2017-10-17 ENCOUNTER — Encounter: Payer: Self-pay | Admitting: Internal Medicine

## 2017-10-21 ENCOUNTER — Other Ambulatory Visit: Payer: Self-pay | Admitting: Internal Medicine

## 2017-10-21 DIAGNOSIS — E119 Type 2 diabetes mellitus without complications: Secondary | ICD-10-CM

## 2017-10-21 DIAGNOSIS — Z794 Long term (current) use of insulin: Principal | ICD-10-CM

## 2017-11-15 ENCOUNTER — Other Ambulatory Visit: Payer: Self-pay | Admitting: Internal Medicine

## 2017-11-22 DIAGNOSIS — S63410A Traumatic rupture of collateral ligament of right index finger at metacarpophalangeal and interphalangeal joint, initial encounter: Secondary | ICD-10-CM | POA: Insufficient documentation

## 2017-11-28 ENCOUNTER — Other Ambulatory Visit: Payer: Self-pay | Admitting: Orthopedic Surgery

## 2017-11-28 DIAGNOSIS — S63419A Traumatic rupture of collateral ligament of unspecified finger at metacarpophalangeal and interphalangeal joint, initial encounter: Secondary | ICD-10-CM

## 2017-12-06 ENCOUNTER — Other Ambulatory Visit: Payer: 59

## 2017-12-18 ENCOUNTER — Other Ambulatory Visit: Payer: 59

## 2018-01-15 ENCOUNTER — Other Ambulatory Visit: Payer: Self-pay | Admitting: Internal Medicine

## 2018-01-19 ENCOUNTER — Ambulatory Visit: Payer: Self-pay | Admitting: Physician Assistant

## 2018-01-24 NOTE — Progress Notes (Signed)
Assessment and Plan:  Hypertension:  -mildly elevated today -Continue medication -monitor blood pressure at home. -Continue DASH diet -Reminder to go to the ER if any CP, SOB, nausea, dizziness, severe HA, changes vision/speech, left arm numbness and tingling and jaw pain.  Cholesterol - Continue diet and exercise -Check cholesterol.  -cont medication  Diabetes with diabetic chronic kidney disease -Continue diet and exercise.  -Check A1C - stop drinking  Vitamin D Def -check level -continue medications.   Bilateral hip pain/hand pain Will do prednisone, knows will increase sugar and will monitor more closely Follow up with ortho   Continue diet and meds as discussed. Further disposition pending results of labs. Discussed med's effects and SE's.    HPI 52 y.o. male  presents for 3 month follow up with hypertension, hyperlipidemia, diabetes and vitamin D deficiency.  He is right handed, had injury right hand, saw Dr. Fredna Dow that wanted an MRI but the patient states it was too expensive. He continues to have pain in 1st digit, requesting prednisone, understands increase risk of sugars. Has bilateral hip pain lateral hip, he was walking 9 miles a day in [place] and states worse after this. He will drink 6-8 beers a day.    His blood pressure has been controlled at home, today their BP is BP: 122/80.He does not workout. He denies chest pain, shortness of breath, dizziness.     He is on cholesterol medication and denies myalgias. His cholesterol is at goal. The cholesterol was:  10/11/2017: Cholesterol 217; HDL 59; LDL Cholesterol (Calc) 132; Triglycerides 145   He has not been working on diet and exercise for diabetes with diabetic chronic kidney disease, he is not on bASA, he is on ACE/ARB, he is not checking his sugars, he is out of insulin takes 40 and 20 of 70/30, and he is on jardiance, no low blood sugars, left it in [place] and denies  foot ulcerations, hyperglycemia, hypoglycemia ,  increased appetite, nausea, paresthesia of the feet, polydipsia, polyuria, visual disturbances, vomiting and weight loss. Last A1C was: 10/11/2017: Hgb A1c MFr Bld 9.4   Patient is on Vitamin D supplement. 10/11/2017: Vit D, 25-Hydroxy 32    Current Medications:  Current Outpatient Medications on File Prior to Visit  Medication Sig Dispense Refill  . aspirin 81 MG tablet Take 81 mg by mouth daily.      Marland Kitchen atorvastatin (LIPITOR) 80 MG tablet TAKE ONE TABLET AT BEDTIME. 90 tablet 0  . blood glucose meter kit and supplies KIT Dispense based on patient and insurance preference. Use up to 3 times daily as directed. (FOR ICD-10-E11.9 and Z79.4). 1 each 0  . Cholecalciferol (VITAMIN D3) 3000 units TABS Take 4,000 Int'l Units by mouth.    . citalopram (CELEXA) 40 MG tablet TAKE 1 TABLET ONCE DAILY. 90 tablet 0  . insulin NPH-regular Human (NOVOLIN 70/30) (70-30) 100 UNIT/ML injection As directed 10 mL 3  . JARDIANCE 25 MG TABS tablet TAKE 1 TABLET BY MOUTH DAILY. 30 tablet 0  . levothyroxine (SYNTHROID, LEVOTHROID) 137 MCG tablet TAKE 1 TABLET EACH DAY. 90 tablet 0  . losartan-hydrochlorothiazide (HYZAAR) 100-25 MG tablet TAKE 1/2 TO 1 TABLET EACH MORNING. 90 tablet 0  . metFORMIN (GLUCOPHAGE-XR) 500 MG 24 hr tablet TAKE 2 TABLET TWICE DAILY FOR DIABETES 360 tablet 0  . MILK THISTLE PO Take 280 mg by mouth.    Marland Kitchen omeprazole (PRILOSEC) 20 MG capsule TAKE (1) CAPSULE DAILY. 30 capsule 0   Current Facility-Administered Medications on  File Prior to Visit  Medication Dose Route Frequency Provider Last Rate Last Dose  . dexamethasone (DECADRON) injection 20 mg  20 mg Intra-articular Once Unk Pinto, MD       Medical History:  Past Medical History:  Diagnosis Date  . Anxiety   . ASTHMA 10/28/2006  . Cough   . Diabetes mellitus type II 2000   age 49  . GERD (gastroesophageal reflux disease)   . Hyperlipidemia   . Hypertension 1995  . Hypothyroidism 2002   thyroidectomy for Goiter  . Lumbar  back pain   . NASH (nonalcoholic steatohepatitis)   . Numbness   . OSA (obstructive sleep apnea) 11/19/2010  . Sleep apnea    Pt states mild case, doesn't use cpap   Allergies: No Known Allergies   Review of Systems:  Review of Systems  Constitutional: Negative for chills, diaphoresis, fever, malaise/fatigue and weight loss.  HENT: Negative for congestion, ear pain, hearing loss, sinus pain, sore throat and tinnitus.   Eyes: Negative.   Respiratory: Negative for cough, shortness of breath and wheezing.   Cardiovascular: Negative for chest pain, palpitations and leg swelling.  Gastrointestinal: Negative for blood in stool, constipation, diarrhea, heartburn, melena, nausea and vomiting.  Genitourinary: Negative for urgency.  Neurological: Negative for dizziness, tremors, focal weakness and headaches.  Psychiatric/Behavioral: Negative for depression and substance abuse. The patient is not nervous/anxious and does not have insomnia.     Family history- Review and unchanged  Social history- Review and unchanged  Physical Exam: BP 122/80   Pulse 90   Temp 98.4 F (36.9 C)   Resp 18   Ht 5' 11.5" (1.816 m)   Wt 225 lb (102.1 kg)   SpO2 97%   BMI 30.94 kg/m  Wt Readings from Last 3 Encounters:  01/25/18 225 lb (102.1 kg)  10/11/17 230 lb 3.2 oz (104.4 kg)  06/12/17 231 lb 9.6 oz (105.1 kg)   General Appearance: Well nourished well developed, non-toxic appearing, in no apparent distress. Eyes: PERRLA, horizontal nystagmus with a rotational component, No conjunctival swelling or erythema ENT/Mouth: Ear canals clear with no erythema, swelling, or discharge.  TMs normal bilaterally, oropharynx clear, moist, with no exudate.   Neck: Supple, thyroid normal, no JVD, no cervical adenopathy.  Respiratory: Respiratory effort normal, breath sounds clear A&P, no wheeze, rhonchi or rales noted.  No retractions, no accessory muscle usage Cardio: RRR with no MRGs. No noted edema.  Abdomen:  Soft, + BS.  Non tender, no guarding, rebound, hernias, masses. Musculoskeletal: Full ROM, 5/5 strength, Normal gait Skin: Warm, dry without rashes, lesions, ecchymosis.  Neuro: Awake and oriented X 3, Cranial nerves intact. No cerebellar symptoms. Normal rapid alternating hand movements, normal finger to nose, normal heel to shin.  Normal gait, normal strength.  Psych: normal affect, Insight and Judgment appropriate.    Vicie Mutters, PA-C 3:23 PM [facility]

## 2018-01-25 ENCOUNTER — Ambulatory Visit (INDEPENDENT_AMBULATORY_CARE_PROVIDER_SITE_OTHER): Payer: Managed Care, Other (non HMO) | Admitting: Physician Assistant

## 2018-01-25 ENCOUNTER — Encounter: Payer: Self-pay | Admitting: Physician Assistant

## 2018-01-25 VITALS — BP 122/80 | HR 90 | Temp 98.4°F | Resp 18 | Ht 71.5 in | Wt 225.0 lb

## 2018-01-25 DIAGNOSIS — I10 Essential (primary) hypertension: Secondary | ICD-10-CM

## 2018-01-25 DIAGNOSIS — E782 Mixed hyperlipidemia: Secondary | ICD-10-CM | POA: Diagnosis not present

## 2018-01-25 DIAGNOSIS — E119 Type 2 diabetes mellitus without complications: Secondary | ICD-10-CM | POA: Diagnosis not present

## 2018-01-25 DIAGNOSIS — Z794 Long term (current) use of insulin: Secondary | ICD-10-CM

## 2018-01-25 DIAGNOSIS — E039 Hypothyroidism, unspecified: Secondary | ICD-10-CM

## 2018-01-25 MED ORDER — PREDNISONE 20 MG PO TABS: ORAL_TABLET | ORAL | 0 refills | Status: AC

## 2018-01-25 NOTE — Patient Instructions (Addendum)
Please take the prednisone to help decrease inflammation and therefore decrease symptoms. Take it it with food to avoid GI upset. It can cause increased energy but on the other hand it can make it hard to sleep at night so please take it AT [place], it takes 8-12 hours to start working so it will NOT affect your sleeping if you take it at night with your food!!  If you are diabetic it will increase your sugars so decrease carbs and monitor your sugars closely.    While on the prednisone may have to increase night time insulin to 25 Check sugar 2-3 x a day Follow up 2 weeks for DM education   What does your A1C results mean?  Your A1C is a measure of your sugar over the past 3 months   Use this chart as a guide to compare the results of your A1C blood test to your estimated average daily blood sugar:  A1C Range Average Sugar  4.0-6.0% 60-120 mg/dl  6.1-7.0% 121 - 150 mg/dl  7.1-8.0% 151-180 mg/dl  8.1-9.0% 181-210 mg/dl  10.1-11% 211-240 mg/dl  11.1-12.0% 271-300 mg/dl  12.1-13.0% 301-330 mg/dl  13.1-14.0% 331-360 mg/dl  Greater than 14.0% Greater than 360 mg/dl   RANGE OF A1C   Your A1C is a measure of your sugar over the past 3 months and is not affected by what you have eaten over the past few days. Diabetes increases your chances of stroke and heart attack over 300 % and is the leading cause of blindness and kidney failure in the [place]. Please make sure you decrease bad carbs like white bread, white rice, potatoes, corn, soft drinks, pasta, cereals, refined sugars, sweet tea, dried fruits, and fruit juice. Good carbs are okay to eat in moderation like sweet potatoes, brown rice, whole grain pasta/bread, most fruit (except dried fruit) and you can eat as many veggies as you want.   Greater than 6.5 is considered diabetic. Between 6.4 and 5.7 is prediabetic If your A1C is less than 5.7 you are NOT diabetic.  Targets for Glucose Readings: Time of Check Target for  patients WITHOUT Diabetes Target for DIABETICS  Before Meals Less than 100  less than 150  Two hours after meals Less than 200  Less than 250    Diabetes is a very complicated disease...lets simplify it.  An easy way to look at it to understand the complications is if you think of the extra sugar floating in your blood stream as glass shards floating through your blood stream.    Diabetes affects your small vessels first: 1) The glass shards (sugar) scraps down the tiny blood vessels in your eyes and lead to diabetic retinopathy, the leading cause of blindness in the [place]. Diabetes is the leading cause of newly diagnosed adult (80 to 54 years of age) blindness in the [place].  2) The glass shards scratches down the tiny vessels of your legs leading to nerve damage called neuropathy and can lead to amputations of your feet. More than 60% of all non-traumatic amputations of lower limbs occur in people with diabetes.  3) Over time the small vessels in your brain are shredded and closed off, individually this does not cause any problems but over a long period of time many of the small vessels being blocked can lead to Vascular Dementia.   4) Your kidney's are a filter system and have a "net" that keeps certain things in the body and lets bad things  out. Sugar shreds this net and leads to kidney damage and eventually failure. Decreasing the sugar that is destroying the net and certain blood pressure medications can help stop or decrease progression of kidney disease. Diabetes was the primary cause of kidney failure in 44 percent of all new cases in 2011.  5) Diabetes also destroys the small vessels in your penis that lead to erectile dysfunction. Eventually the vessels are so damaged that you may not be responsive to cialis or viagra.   Diabetes and your large vessels: Your larger vessels consist of your coronary arteries in your heart and the carotid vessels to your brain. Diabetes or even  increased sugars put you at 300% increased risk of heart attack and stroke and this is why.. The sugar scrapes down your large blood vessels and your body sees this as an internal injury and tries to repair itself. Just like you get a scab on your skin, your platelets will stick to the blood vessel wall trying to heal it. This is why we have diabetics on low dose aspirin daily, this prevents the platelets from sticking and can prevent plaque formation. In addition, your body takes cholesterol and tries to shove it into the open wound. This is why we want your LDL, or bad cholesterol, below 70.   The combination of platelets and cholesterol over 5-10 years forms plaque that can break off and cause a heart attack or stroke.   PLEASE REMEMBER:  Diabetes is preventable! Up to 32 percent of complications and morbidities among individuals with type 2 diabetes can be prevented, delayed, or effectively treated and minimized with regular visits to a health professional, appropriate monitoring and medication, and a healthy diet and lifestyle.

## 2018-01-26 LAB — COMPLETE METABOLIC PANEL WITH GFR
AG RATIO: 1.5 (calc) (ref 1.0–2.5)
ALBUMIN MSPROF: 4.7 g/dL (ref 3.6–5.1)
ALT: 33 U/L (ref 9–46)
AST: 22 U/L (ref 10–35)
Alkaline phosphatase (APISO): 104 U/L (ref 40–115)
BUN: 24 mg/dL (ref 7–25)
CALCIUM: 9.8 mg/dL (ref 8.6–10.3)
CO2: 24 mmol/L (ref 20–32)
CREATININE: 1.18 mg/dL (ref 0.70–1.33)
Chloride: 98 mmol/L (ref 98–110)
GFR, EST NON AFRICAN AMERICAN: 70 mL/min/{1.73_m2} (ref >=60)
GFR, Est African American: 81 mL/min/{1.73_m2} (ref >=60)
GLOBULIN: 3.2 g/dL (ref 1.9–3.7)
Glucose, Bld: 303 mg/dL — ABNORMAL HIGH (ref 65–99)
Potassium: 4.1 mmol/L (ref 3.5–5.3)
SODIUM: 134 mmol/L — AB (ref 135–146)
Total Bilirubin: 0.6 mg/dL (ref 0.2–1.2)
Total Protein: 7.9 g/dL (ref 6.1–8.1)

## 2018-01-26 LAB — CBC WITH DIFFERENTIAL/PLATELET
BASOS PCT: 0.8 %
Basophils Absolute: 72 cells/uL (ref 0–200)
EOS PCT: 2.8 %
Eosinophils Absolute: 252 cells/uL (ref 15–500)
HCT: 45 % (ref 38.5–50.0)
HEMOGLOBIN: 15.9 g/dL (ref 13.2–17.1)
Lymphs Abs: 2718 cells/uL (ref 850–3900)
MCH: 32 pg (ref 27.0–33.0)
MCHC: 35.3 g/dL (ref 32.0–36.0)
MCV: 90.5 fL (ref 80.0–100.0)
MONOS PCT: 8.3 %
MPV: 11.5 fL (ref 7.5–12.5)
NEUTROS ABS: 5211 {cells}/uL (ref 1500–7800)
Neutrophils Relative %: 57.9 %
Platelets: 214 10*3/uL (ref 140–400)
RBC: 4.97 10*6/uL (ref 4.20–5.80)
RDW: 12 % (ref 11.0–15.0)
Total Lymphocyte: 30.2 %
WBC mixed population: 747 cells/uL (ref 200–950)
WBC: 9 10*3/uL (ref 3.8–10.8)

## 2018-01-26 LAB — HEMOGLOBIN A1C
EAG (MMOL/L): 13.5 (calc)
Hgb A1c MFr Bld: 10.1 % of total Hgb — ABNORMAL HIGH (ref <5.7)
Mean Plasma Glucose: 243 (calc)

## 2018-01-26 LAB — TSH: TSH: 9.18 mIU/L — ABNORMAL HIGH (ref 0.40–4.50)

## 2018-01-26 LAB — LIPID PANEL
CHOLESTEROL: 223 mg/dL — AB (ref <200)
HDL: 51 mg/dL (ref >40)
LDL Cholesterol (Calc): 130 mg/dL (calc) — ABNORMAL HIGH
Non-HDL Cholesterol (Calc): 172 mg/dL (calc) — ABNORMAL HIGH (ref <130)
Total CHOL/HDL Ratio: 4.4 (calc) (ref <5.0)
Triglycerides: 293 mg/dL — ABNORMAL HIGH (ref <150)

## 2018-02-21 ENCOUNTER — Other Ambulatory Visit: Payer: Self-pay | Admitting: Physician Assistant

## 2018-02-21 ENCOUNTER — Other Ambulatory Visit: Payer: Self-pay | Admitting: Internal Medicine

## 2018-04-27 ENCOUNTER — Ambulatory Visit: Payer: Self-pay | Admitting: Internal Medicine

## 2018-05-06 ENCOUNTER — Encounter: Payer: Self-pay | Admitting: Internal Medicine

## 2018-05-06 NOTE — Patient Instructions (Signed)

## 2018-05-07 ENCOUNTER — Ambulatory Visit (INDEPENDENT_AMBULATORY_CARE_PROVIDER_SITE_OTHER): Payer: Managed Care, Other (non HMO) | Admitting: Internal Medicine

## 2018-05-07 DIAGNOSIS — Z79899 Other long term (current) drug therapy: Secondary | ICD-10-CM

## 2018-05-07 DIAGNOSIS — E119 Type 2 diabetes mellitus without complications: Secondary | ICD-10-CM | POA: Diagnosis not present

## 2018-05-07 DIAGNOSIS — E039 Hypothyroidism, unspecified: Secondary | ICD-10-CM

## 2018-05-07 DIAGNOSIS — E559 Vitamin D deficiency, unspecified: Secondary | ICD-10-CM

## 2018-05-07 DIAGNOSIS — Z794 Long term (current) use of insulin: Secondary | ICD-10-CM

## 2018-05-07 DIAGNOSIS — E782 Mixed hyperlipidemia: Secondary | ICD-10-CM | POA: Diagnosis not present

## 2018-05-07 DIAGNOSIS — I10 Essential (primary) hypertension: Secondary | ICD-10-CM | POA: Diagnosis not present

## 2018-05-09 ENCOUNTER — Encounter: Payer: Self-pay | Admitting: Internal Medicine

## 2018-05-09 NOTE — Patient Instructions (Signed)

## 2018-05-09 NOTE — Progress Notes (Signed)
This very nice 53 y.o. MWM presents for 6 month follow up with HTN, HLD, T2_IDDM and Vitamin D Deficiency.          Patient is treated for HTN (age 27 yo in 85) & BP has been controlled at home.  Today's BP is at goal-  140/80. Patient has had no complaints of any cardiac type chest pain, palpitations, dyspnea / orthopnea / PND, dizziness, claudication, or dependent edema.     Hyperlipidemia is not controlled with diet & meds. Patient denies myalgias or other med SE's. Last Lipids were not at goal: Lab Results  Component Value Date   CHOL 223 (H) 01/25/2018   HDL 51 01/25/2018   LDLCALC 130 (H) 01/25/2018   TRIG 293 (H) 01/25/2018   CHOLHDL 4.4 01/25/2018      Also, the patient has history of T2_NIDDM (age 42  In 7) and has had no symptoms of reactive hypoglycemia, diabetic polys, paresthesias or visual blurring.  He reports am glucoses usually 170-190 and pm glucoses usually 200-300's. Patient historically has had poor control commensurate with his poor compliance and last A1c was not at goal: Lab Results  Component Value Date   HGBA1C 10.1 (H) 01/25/2018                                        Patient has been on thyroid Replacement since thyroidectomy for goiter in 2002.      Further, the patient also has history of Vitamin D Deficiency ("20" / [id number]) and supplements vitamin D without any suspected side-effects. Last vitamin D was not at goal: Lab Results  Component Value Date   VD25OH 32 10/11/2017   Current Outpatient Medications on File Prior to Visit  Medication Sig  . aspirin 81 MG tablet Take 81 mg by mouth daily.    Marland Kitchen atorvastatin (LIPITOR) 80 MG tablet TAKE ONE TABLET AT BEDTIME.  . blood glucose meter kit and supplies KIT Dispense based on patient and insurance preference. Use up to 3 times daily as directed. (FOR ICD-10-E11.9 and Z79.4).  Marland Kitchen Cholecalciferol (VITAMIN D3) 3000 units TABS Take 4,000 Int'l Units by mouth.  . citalopram (CELEXA) 40 MG tablet TAKE 1  TABLET ONCE DAILY.  . Empagliflozin (JARDIANCE PO) Take by mouth.  . insulin NPH-regular Human (NOVOLIN 70/30) (70-30) 100 UNIT/ML injection As directed  . levothyroxine (SYNTHROID, LEVOTHROID) 137 MCG tablet TAKE 1 TABLET EACH DAY.  Marland Kitchen losartan-hydrochlorothiazide (HYZAAR) 100-25 MG tablet TAKE 1/2 TO 1 TABLET EACH MORNING.  . metFORMIN (GLUCOPHAGE-XR) 500 MG 24 hr tablet TAKE 2 TABLET TWICE DAILY FOR DIABETES  . MILK THISTLE PO Take 280 mg by mouth.  Marland Kitchen omeprazole (PRILOSEC) 20 MG capsule TAKE (1) CAPSULE DAILY.   No current facility-administered medications on file prior to visit.    PMHx:   Past Medical History:  Diagnosis Date  . Anxiety   . ASTHMA 10/28/2006  . Cough   . Diabetes mellitus type II 2000   age 58  . GERD (gastroesophageal reflux disease)   . Hyperlipidemia   . Hypertension 1995  . Hypothyroidism 2002   thyroidectomy for Goiter  . Lumbar back pain   . NASH (nonalcoholic steatohepatitis)   . Numbness   . OSA (obstructive sleep apnea) 11/19/2010  . Sleep apnea    Pt states mild case, doesn't use cpap   Immunization History  Administered Date(s)  Administered  . Influenza Inj Mdck Quad With Preservative 02/20/2017  . Influenza Split 01/16/2014  . PPD Test 10/11/2017  . Pneumococcal Polysaccharide-23 08/16/2012  . Tdap 08/10/2015   Past Surgical History:  Procedure Laterality Date  . bilateral shoulder surgery    . right knee repair  1997  . THYROIDECTOMY     right  . TRIGGER FINGER RELEASE    . TRIGGER FINGER RELEASE  02/23/2011   Procedure: RELEASE TRIGGER FINGER/A-1 PULLEY;  Surgeon: Meredith Pel;  Location: [facility];  Service: Orthopedics;  Laterality: Right;  Right 4th trigger finger release   FHx:    Reviewed / unchanged  SHx:    Reviewed / unchanged   Systems Review:  Constitutional: Denies fever, chills, wt changes, headaches, insomnia, fatigue, night sweats, change in appetite. Eyes: Denies redness, blurred vision, diplopia, discharge,  itchy, watery eyes.  ENT: Denies discharge, congestion, post nasal drip, epistaxis, sore throat, earache, hearing loss, dental pain, tinnitus, vertigo, sinus pain, snoring.  CV: Denies chest pain, palpitations, irregular heartbeat, syncope, dyspnea, diaphoresis, orthopnea, PND, claudication or edema. Respiratory: denies cough, dyspnea, DOE, pleurisy, hoarseness, laryngitis, wheezing.  Gastrointestinal: Denies dysphagia, odynophagia, heartburn, reflux, water brash, abdominal pain or cramps, nausea, vomiting, bloating, diarrhea, constipation, hematemesis, melena, hematochezia  or hemorrhoids. Genitourinary: Denies dysuria, frequency, urgency, nocturia, hesitancy, discharge, hematuria or flank pain. Musculoskeletal: Denies arthralgias, myalgias, stiffness, jt. swelling, pain, limping or strain/sprain.  Skin: Denies pruritus, rash, hives, warts, acne, eczema or change in skin lesion(s). Neuro: No weakness, tremor, incoordination, spasms, paresthesia or pain. Psychiatric: Denies confusion, memory loss or sensory loss. Endo: Denies change in weight, skin or hair change.  Heme/Lymph: No excessive bleeding, bruising or enlarged lymph nodes.  Physical Exam  BP 140/80   Pulse 87   Temp 98 F (36.7 C)   Ht 5' 11.5" (1.816 m)   Wt 226 lb 12.8 oz (102.9 kg)   SpO2 96%   BMI 31.19 kg/m   Appears  well nourished, well groomed  and in no distress.  Eyes: PERRLA, EOMs, conjunctiva no swelling or erythema. Sinuses: No frontal/maxillary tenderness ENT/Mouth: EAC's clear, TM's nl w/o erythema, bulging. Nares clear w/o erythema, swelling, exudates. Oropharynx clear without erythema or exudates. Oral hygiene is good. Tongue normal, non obstructing. Hearing intact.  Neck: Supple. Thyroid not palpable. Car 2+/2+ without bruits, nodes or JVD. Chest: Respirations nl with BS clear & equal w/o rales, rhonchi, wheezing or stridor.  Cor: Heart sounds normal w/ regular rate and rhythm without sig. murmurs, gallops,  clicks or rubs. Peripheral pulses normal and equal  without edema.  Abdomen: Soft & bowel sounds normal. Non-tender w/o guarding, rebound, hernias, masses or organomegaly.  Lymphatics: Unremarkable.  Musculoskeletal: Full ROM all peripheral extremities, joint stability, 5/5 strength and normal gait.  Skin: Warm, dry without exposed rashes, lesions or ecchymosis apparent.  Neuro: Cranial nerves intact, reflexes equal bilaterally. Sensory-motor testing grossly intact. Tendon reflexes grossly intact.  Pysch: Alert & oriented x 3.  Insight and judgement nl & appropriate. No ideations.  Assessment and Plan:  1. Essential hypertension  - Continue medication, monitor blood pressure at home.  - Continue DASH diet.  Reminder to go to the ER if any CP,  SOB, nausea, dizziness, severe HA, changes vision/speech.  - CBC with Differential/Platelet - COMPLETE METABOLIC PANEL WITH GFR - Magnesium - TSH  2. Hyperlipidemia, mixed  - Continue diet/meds, exercise,& lifestyle modifications.  - Continue monitor periodic cholesterol/liver & renal functions   - Lipid panel - TSH  3. Diabetes mellitus type 2, insulin dependent (HCC)  - Continue diet, exercise  - Lifestyle modifications.  - Monitor appropriate labs.  - Hemoglobin A1c  4. Vitamin D deficiency  - Continue supplementation.  - VITAMIN D 25 Hydroxyl  5. Hypothyroidism  - TSH  6. Medication management  - CBC with Differential/Platelet - COMPLETE METABOLIC PANEL WITH GFR - Magnesium - Lipid panel - TSH - Hemoglobin A1c - VITAMIN D 25 Hydroxyl        Discussed  regular exercise, BP monitoring, weight control to achieve/maintain BMI less than 25 and discussed med and SE's. Recommended labs to assess and monitor clinical status with further disposition pending results of labs. Over 30 minutes of exam, counseling, chart review was performed.

## 2018-05-10 ENCOUNTER — Encounter: Payer: Self-pay | Admitting: Internal Medicine

## 2018-05-10 ENCOUNTER — Ambulatory Visit: Payer: Managed Care, Other (non HMO) | Admitting: Internal Medicine

## 2018-05-10 VITALS — BP 140/80 | HR 87 | Temp 98.0°F | Ht 71.5 in | Wt 226.8 lb

## 2018-05-10 DIAGNOSIS — E782 Mixed hyperlipidemia: Secondary | ICD-10-CM | POA: Diagnosis not present

## 2018-05-10 DIAGNOSIS — E119 Type 2 diabetes mellitus without complications: Secondary | ICD-10-CM

## 2018-05-10 DIAGNOSIS — Z79899 Other long term (current) drug therapy: Secondary | ICD-10-CM

## 2018-05-10 DIAGNOSIS — I10 Essential (primary) hypertension: Secondary | ICD-10-CM

## 2018-05-10 DIAGNOSIS — E039 Hypothyroidism, unspecified: Secondary | ICD-10-CM

## 2018-05-10 DIAGNOSIS — Z794 Long term (current) use of insulin: Secondary | ICD-10-CM

## 2018-05-10 DIAGNOSIS — E559 Vitamin D deficiency, unspecified: Secondary | ICD-10-CM | POA: Diagnosis not present

## 2018-05-11 LAB — CBC WITH DIFFERENTIAL/PLATELET
Absolute Monocytes: 896 cells/uL (ref 200–950)
Basophils Absolute: 90 cells/uL (ref 0–200)
Basophils Relative: 0.8 %
Eosinophils Absolute: 224 cells/uL (ref 15–500)
Eosinophils Relative: 2 %
HCT: 42.9 % (ref 38.5–50.0)
Hemoglobin: 15.4 g/dL (ref 13.2–17.1)
Lymphs Abs: 2352 cells/uL (ref 850–3900)
MCH: 33 pg (ref 27.0–33.0)
MCHC: 35.9 g/dL (ref 32.0–36.0)
MCV: 92.1 fL (ref 80.0–100.0)
MONOS PCT: 8 %
MPV: 10.6 fL (ref 7.5–12.5)
Neutro Abs: 7638 cells/uL (ref 1500–7800)
Neutrophils Relative %: 68.2 %
Platelets: 244 10*3/uL (ref 140–400)
RBC: 4.66 10*6/uL (ref 4.20–5.80)
RDW: 12.3 % (ref 11.0–15.0)
Total Lymphocyte: 21 %
WBC: 11.2 10*3/uL — ABNORMAL HIGH (ref 3.8–10.8)

## 2018-05-11 LAB — COMPLETE METABOLIC PANEL WITH GFR
AG Ratio: 1.5 (calc) (ref 1.0–2.5)
ALT: 39 U/L (ref 9–46)
AST: 24 U/L (ref 10–35)
Albumin: 4.7 g/dL (ref 3.6–5.1)
Alkaline phosphatase (APISO): 107 U/L (ref 35–144)
BILIRUBIN TOTAL: 0.5 mg/dL (ref 0.2–1.2)
BUN: 22 mg/dL (ref 7–25)
CHLORIDE: 100 mmol/L (ref 98–110)
CO2: 26 mmol/L (ref 20–32)
Calcium: 9.7 mg/dL (ref 8.6–10.3)
Creat: 0.91 mg/dL (ref 0.70–1.33)
GFR, EST AFRICAN AMERICAN: 110 mL/min/{1.73_m2} (ref >=60)
GFR, Est Non African American: 95 mL/min/{1.73_m2} (ref >=60)
Globulin: 3.2 g/dL (calc) (ref 1.9–3.7)
Glucose, Bld: 269 mg/dL — ABNORMAL HIGH (ref 65–99)
Potassium: 3.9 mmol/L (ref 3.5–5.3)
Sodium: 136 mmol/L (ref 135–146)
Total Protein: 7.9 g/dL (ref 6.1–8.1)

## 2018-05-11 LAB — HEMOGLOBIN A1C
Hgb A1c MFr Bld: 11 % of total Hgb — ABNORMAL HIGH (ref <5.7)
Mean Plasma Glucose: 269 (calc)
eAG (mmol/L): 14.9 (calc)

## 2018-05-11 LAB — MAGNESIUM: Magnesium: 2.1 mg/dL (ref 1.5–2.5)

## 2018-05-11 LAB — LIPID PANEL
Cholesterol: 206 mg/dL — ABNORMAL HIGH (ref <200)
HDL: 49 mg/dL (ref >=40)
LDL Cholesterol (Calc): 117 mg/dL (calc) — ABNORMAL HIGH
Non-HDL Cholesterol (Calc): 157 mg/dL (calc) — ABNORMAL HIGH (ref <130)
Total CHOL/HDL Ratio: 4.2 (calc) (ref <5.0)
Triglycerides: 301 mg/dL — ABNORMAL HIGH (ref <150)

## 2018-05-11 LAB — TSH: TSH: 5.95 mIU/L — ABNORMAL HIGH (ref 0.40–4.50)

## 2018-05-11 LAB — VITAMIN D 25 HYDROXY (VIT D DEFICIENCY, FRACTURES): Vit D, 25-Hydroxy: 25 ng/mL — ABNORMAL LOW (ref 30–100)

## 2018-05-13 ENCOUNTER — Encounter: Payer: Self-pay | Admitting: Internal Medicine

## 2018-05-13 ENCOUNTER — Other Ambulatory Visit: Payer: Self-pay | Admitting: Internal Medicine

## 2018-05-13 MED ORDER — AZITHROMYCIN 250 MG PO TABS: ORAL_TABLET | ORAL | 1 refills | Status: DC

## 2018-05-25 ENCOUNTER — Other Ambulatory Visit: Payer: Self-pay | Admitting: Physician Assistant

## 2018-05-25 ENCOUNTER — Other Ambulatory Visit: Payer: Self-pay | Admitting: Internal Medicine

## 2018-05-25 DIAGNOSIS — E119 Type 2 diabetes mellitus without complications: Secondary | ICD-10-CM

## 2018-05-25 DIAGNOSIS — Z794 Long term (current) use of insulin: Principal | ICD-10-CM

## 2018-06-22 ENCOUNTER — Other Ambulatory Visit: Payer: Self-pay | Admitting: Internal Medicine

## 2018-07-05 ENCOUNTER — Other Ambulatory Visit: Payer: Self-pay | Admitting: Internal Medicine

## 2018-08-13 DIAGNOSIS — K219 Gastro-esophageal reflux disease without esophagitis: Secondary | ICD-10-CM | POA: Insufficient documentation

## 2018-08-13 NOTE — Progress Notes (Deleted)
Assessment and Plan:   Hypertension  Continue medication: *** Monitor blood pressure at home; call if consistently over 130/80 Continue DASH diet.   Reminder to go to the ER if any CP, SOB, nausea, dizziness, severe HA, changes vision/speech, left arm numbness and tingling and jaw pain.  Cholesterol Continue medications: *** Continue low cholesterol diet and exercise.  Check lipid panel.   Diabetes with diabetic chronic kidney disease Medications: *** Discussed disease and risks Discussed diet/exercise, weight management  A1C  Hypothyroidism continue medications the same pending lab results reminded to take on an empty stomach 30-23mns before food.  check TSH level  Vitamin D Def Continue supplementation for goal of 60-100 Check vitamin D level  GERD Well managed on current medications Discussed diet, avoiding triggers and other lifestyle changes  Depression/anxiety Continue medications  Lifestyle discussed: diet/exerise, sleep hygiene, stress management, hydration   Continue diet and meds as discussed. Further disposition pending results of labs. Discussed med's effects and SE's.    Future Appointments  Date Time Provider [place] 08/14/2018  2:30 PM CLiane Comber NP [facility] None  11/15/2018  3:00 PM MUnk Pinto MD [facility] None     HPI 53y.o. male  presents for 3 month follow up with hypertension, hyperlipidemia, diabetes, GERD, depression, hypothyroid and vitamin D deficiency.  He is right handed, had injury right hand, saw Dr. KFredna Dowthat wanted an MRI but the patient states it was too expensive. He continues to have pain in 1st digit, requesting prednisone, understands increase risk of sugars. Has bilateral hip pain lateral hip, he was walking 9 miles a day in [place] and states worse after this. He will drink 6-8 beers a day.   he has a diagnosis of depression and is currently on celexa 40 mg daily, reports symptoms are*** well controlled on  current regimen.   he has a diagnosis of GERD which is currently managed by omeprazole 20 mg daily  he reports symptoms {ACTION; ARE/ARE NVHQ:[id number]}currently well controlled, and denies breakthrough reflux, burning in chest, hoarseness or cough.    BMI is There is no height or weight on file to calculate BMI., he {HAS HAS NWUX:32440}been working on diet and exercise. Wt Readings from Last 3 Encounters:  05/10/18 226 lb 12.8 oz (102.9 kg)  01/25/18 225 lb (102.1 kg)  10/11/17 230 lb 3.2 oz (104.4 kg)    His blood pressure {HAS HAS NOT:18834} been controlled at home, today their BP is    He {DOES_DOES NNUU:72536}workout. He denies chest pain, shortness of breath, dizziness.   He {ACTION; IS/IS NUYQ:[id number]}on cholesterol medication and denies myalgias. His cholesterol {ACTION; IS/IS NOT:[id number]} at goal. The cholesterol last visit was:   Lab Results  Component Value Date   CHOL 206 (H) 05/10/2018   HDL 49 05/10/2018   LDLCALC 117 (H) 05/10/2018   LDLDIRECT 120.3 11/07/2006   TRIG 301 (H) 05/10/2018   CHOLHDL 4.2 05/10/2018    He {Has/has not:18111} been working on diet and exercise for T2DM (on metformin ***, novolin 70/30 40 units AM and *** units PM, and denies {Symptoms; diabetes w/o none:19199}. Last A1C in the office was:  Lab Results  Component Value Date   HGBA1C 11.0 (H) 05/10/2018   Patient has been on thyroid Replacement since thyroidectomy for goiter in 2002. His medication was not changed last visit.   Lab Results  Component Value Date   TSH 5.95 (H) 05/10/2018   Patient is *** on Vitamin D supplement.  Lab Results  Component Value Date   VD25OH 25 (L) 05/10/2018         Current Medications:  Current Outpatient Medications on File Prior to Visit  Medication Sig Dispense Refill  . aspirin 81 MG tablet Take 81 mg by mouth daily.      Marland Kitchen atorvastatin (LIPITOR) 80 MG tablet Take 1 tablet daily for Cholesterol 90 tablet 0  . azithromycin (ZITHROMAX) 250 MG  tablet Take 2 tablets (500 mg) on  Day 1,  followed by 1 tablet (250 mg) once daily on Days 2 through 5. 6 each 1  . blood glucose meter kit and supplies KIT Dispense based on patient and insurance preference. Use up to 3 times daily as directed. (FOR ICD-10-E11.9 and Z79.4). 1 each 0  . Cholecalciferol (VITAMIN D3) 3000 units TABS Take 4,000 Int'l Units by mouth.    . citalopram (CELEXA) 40 MG tablet TAKE 1 TABLET ONCE DAILY. 30 tablet 0  . hydrochlorothiazide (HYDRODIURIL) 25 MG tablet TAKE 1/2 TO 1 TABLET EACH MORNING. 90 tablet 0  . insulin NPH-regular Human (NOVOLIN 70/30) (70-30) 100 UNIT/ML injection As directed 10 mL 3  . levothyroxine (SYNTHROID, LEVOTHROID) 137 MCG tablet TAKE 1 TABLET EACH DAY. 90 tablet 0  . losartan (COZAAR) 100 MG tablet TAKE 1/2 TO 1 TABLET ONCE DAILY. 90 tablet 0  . losartan-hydrochlorothiazide (HYZAAR) 100-25 MG tablet TAKE 1/2 TO 1 TABLET EACH MORNING. 90 tablet 0  . metFORMIN (GLUCOPHAGE-XR) 500 MG 24 hr tablet Take 2 tablets 2 x /day for Diabetes 120 tablet 0  . MILK THISTLE PO Take 280 mg by mouth.    Marland Kitchen omeprazole (PRILOSEC) 20 MG capsule Take 1 capsule Daily for GERD / Acid Reflux 90 capsule 1   No current facility-administered medications on file prior to visit.    Medical History:  Past Medical History:  Diagnosis Date  . Anxiety   . ASTHMA 10/28/2006  . Cough   . Diabetes mellitus type II 2000   age 4  . GERD (gastroesophageal reflux disease)   . Hyperlipidemia   . Hypertension 1995  . Hypothyroidism 2002   thyroidectomy for Goiter  . Lumbar back pain   . NASH (nonalcoholic steatohepatitis)   . Numbness   . OSA (obstructive sleep apnea) 11/19/2010  . Sleep apnea    Pt states mild case, doesn't use cpap   Allergies: No Known Allergies   Review of Systems:  Review of Systems  Constitutional: Negative for chills, diaphoresis, fever, malaise/fatigue and weight loss.  HENT: Negative for congestion, ear pain, hearing loss, sinus pain, sore  throat and tinnitus.   Eyes: Negative.   Respiratory: Negative for cough, shortness of breath and wheezing.   Cardiovascular: Negative for chest pain, palpitations and leg swelling.  Gastrointestinal: Negative for blood in stool, constipation, diarrhea, heartburn, melena, nausea and vomiting.  Genitourinary: Negative for urgency.  Neurological: Negative for dizziness, tremors, focal weakness and headaches.  Psychiatric/Behavioral: Negative for depression and substance abuse. The patient is not nervous/anxious and does not have insomnia.     Family history- Review and unchanged  Social history- Review and unchanged  Physical Exam: There were no vitals taken for this visit. Wt Readings from Last 3 Encounters:  05/10/18 226 lb 12.8 oz (102.9 kg)  01/25/18 225 lb (102.1 kg)  10/11/17 230 lb 3.2 oz (104.4 kg)   General Appearance: Well nourished well developed, non-toxic appearing, in no apparent distress. Eyes: PERRLA, horizontal nystagmus with a rotational component, No conjunctival swelling or  erythema ENT/Mouth: Ear canals clear with no erythema, swelling, or discharge.  TMs normal bilaterally, oropharynx clear, moist, with no exudate.   Neck: Supple, thyroid normal, no JVD, no cervical adenopathy.  Respiratory: Respiratory effort normal, breath sounds clear A&P, no wheeze, rhonchi or rales noted.  No retractions, no accessory muscle usage Cardio: RRR with no MRGs. No noted edema.  Abdomen: Soft, + BS.  Non tender, no guarding, rebound, hernias, masses. Musculoskeletal: Full ROM, 5/5 strength, Normal gait Skin: Warm, dry without rashes, lesions, ecchymosis.  Neuro: Awake and oriented X 3, Cranial nerves intact. No cerebellar symptoms.  Normal gait, normal strength.  Psych: normal affect, Insight and Judgment appropriate.    [name] Ribas, NP 1:44 PM [facility]

## 2018-08-14 ENCOUNTER — Ambulatory Visit: Payer: Self-pay | Admitting: Adult Health

## 2018-09-07 ENCOUNTER — Other Ambulatory Visit: Payer: Self-pay | Admitting: Adult Health

## 2018-09-07 ENCOUNTER — Other Ambulatory Visit: Payer: Self-pay | Admitting: Internal Medicine

## 2018-09-07 DIAGNOSIS — Z794 Long term (current) use of insulin: Secondary | ICD-10-CM

## 2018-09-07 DIAGNOSIS — E119 Type 2 diabetes mellitus without complications: Secondary | ICD-10-CM

## 2018-09-11 NOTE — Progress Notes (Signed)
Assessment and Plan:   Hypertension  Increase to full losartan/hctz 100/25 mg daily Monitor blood pressure at home; call if consistently over 130/80, discussed ideally <120/80 Continue DASH diet.   Reminder to go to the ER if any CP, SOB, nausea, dizziness, severe HA, changes vision/speech, left arm numbness and tingling and jaw pain.  Cholesterol Stop atorvastatin, start rosuvastatin 40 mg  Reviewed LDL goal <70 Continue low cholesterol diet and exercise.  Check lipid panel.   Diabetes with diabetic chronic kidney disease Medications: metformin - add 3rd tab with dinner; increase to 2 tabs in PM if tolerating after 2 weeks for total of 4 tabs daily; discussed imodium PRN if initial diarrhea Sent in novolin 70/30 pens which he feels will improve compliance; continue 40 units AM, start 20 units PM and gradually increase until fasting glucose is <150, then if tolerating and no hypoglycemia until <130 Discussed low glucose overnight is more dangerous than high; call with any concerns or hypoglycemia Discussed disease and risks Discussed diet/exercise, weight management  A1C  Hypothyroidism continue medications the same pending lab results reminded to take on an empty stomach 30-22mns before food.  check TSH level  Vitamin D Def Taking 4000 IU daily/more consistently Continue supplementation for goal of 60-100 Check vitamin D level  GERD Symptoms well managed without breakthrough Will try to get off PPI given info for taper and famotidine sent in Discussed diet, avoiding triggers and other lifestyle changes  Depression/anxiety Continue medications  Lifestyle discussed: diet/exerise, sleep hygiene, stress management, hydration   Continue diet and meds as discussed. Further disposition pending results of labs. Discussed med's effects and SE's.    Future Appointments  Date Time Provider DPre[place] 11/15/2018  3:00 PM MUnk Pinto MD [facility] None     HPI 53 y.o. male  presents for 3 month follow up with hypertension, hyperlipidemia, diabetes, GERD, depression, hypothyroid and vitamin D deficiency.  He is right handed, had injury right hand, saw Dr. KFredna Dowthat wanted an MRI but the patient states it was too expensive.   He admits to drinking 6-8 beers a day.   he has a diagnosis of depression and is currently on celexa 40 mg daily, reports symptoms are well controlled on current regimen.   he has a diagnosis of GERD which is currently managed by omeprazole 20 mg daily  he reports symptoms is currently well controlled, and denies breakthrough reflux, burning in chest, hoarseness or cough.    BMI is Body mass index is 31.08 kg/m., he has not been working on diet and exercise, is generally active in his yardwork and [place].  He avoids sweet drinks Wt Readings from Last 3 Encounters:  09/12/18 226 lb (102.5 kg)  05/10/18 226 lb 12.8 oz (102.9 kg)  01/25/18 225 lb (102.1 kg)   He does not currently check BP at home, today their BP is BP: 140/90  He does not workout. He denies chest pain, shortness of breath, dizziness.   He is on cholesterol medication (atorvastatin 80 mg daily) and denies myalgias. His cholesterol is not at goal. The cholesterol last visit was:   Lab Results  Component Value Date   CHOL 206 (H) 05/10/2018   HDL 49 05/10/2018   LDLCALC 117 (H) 05/10/2018   LDLDIRECT 120.3 11/07/2006   TRIG 301 (H) 05/10/2018   CHOLHDL 4.2 05/10/2018    He has not been working on diet and exercise for T2DM (on metformin 1000 mg in AM,  hasn't tolerated in PM, novolin 70/30 40 units AM and ? units PM, not typically taking, endorses having to draw up as a barrier, would prefer pen if possible, and denies hypoglycemia , increased appetite, nausea, paresthesia of the feet, polydipsia, polyuria and visual disturbances. He admits not taking glucose regularly, has all needed supplies, when checking fasting typically 170-180. Last  A1C in the office was:  Lab Results  Component Value Date   HGBA1C 11.0 (H) 05/10/2018   Patient has been on thyroid Replacement since thyroidectomy for goiter in 2002. His medication was not changed last visit. Taking levothyroxine 137 mcg, takes with all other AM medications with breakfast, has always taken it this way.   Lab Results  Component Value Date   TSH 5.95 (H) 05/10/2018   Patient is on Vitamin D supplement, was out at the last visit, currently taking 4000 IU  Lab Results  Component Value Date   VD25OH 25 (L) 05/10/2018       Current Medications:  Current Outpatient Medications on File Prior to Visit  Medication Sig Dispense Refill  . aspirin 81 MG tablet Take 81 mg by mouth daily.      Marland Kitchen atorvastatin (LIPITOR) 80 MG tablet Take 1 tablet daily for Cholesterol 90 tablet 0  . azithromycin (ZITHROMAX) 250 MG tablet Take 2 tablets (500 mg) on  Day 1,  followed by 1 tablet (250 mg) once daily on Days 2 through 5. 6 each 1  . blood glucose meter kit and supplies KIT Dispense based on patient and insurance preference. Use up to 3 times daily as directed. (FOR ICD-10-E11.9 and Z79.4). 1 each 0  . Cholecalciferol (VITAMIN D3) 3000 units TABS Take 4,000 Int'l Units by mouth.    . citalopram (CELEXA) 40 MG tablet Take 1 tablet Daily for Mood 90 tablet 0  . levothyroxine (SYNTHROID, LEVOTHROID) 137 MCG tablet TAKE 1 TABLET EACH DAY. 90 tablet 0  . losartan-hydrochlorothiazide (HYZAAR) 100-25 MG tablet TAKE 1/2 TO 1 TABLET EACH MORNING. 90 tablet 0  . metFORMIN (GLUCOPHAGE-XR) 500 MG 24 hr tablet Take 2 tablets 2 x /daily with Meals for Diabetes 360 tablet 0  . MILK THISTLE PO Take 280 mg by mouth.    Marland Kitchen omeprazole (PRILOSEC) 20 MG capsule Take 1 capsule Daily for GERD / Acid Reflux 90 capsule 1  . [DISCONTINUED] insulin NPH-regular Human (NOVOLIN 70/30) (70-30) 100 UNIT/ML injection As directed 10 mL 3  . hydrochlorothiazide (HYDRODIURIL) 25 MG tablet TAKE 1/2 TO 1 TABLET EACH MORNING.  (Patient not taking: Reported on 09/12/2018) 90 tablet 0  . losartan (COZAAR) 100 MG tablet TAKE 1/2 TO 1 TABLET ONCE DAILY. (Patient not taking: Reported on 09/12/2018) 90 tablet 0   No current facility-administered medications on file prior to visit.    Medical History:  Past Medical History:  Diagnosis Date  . Anxiety   . ASTHMA 10/28/2006  . Cough   . Diabetes mellitus type II 2000   age 39  . GERD (gastroesophageal reflux disease)   . Hyperlipidemia   . Hypertension 1995  . Hypothyroidism 2002   thyroidectomy for Goiter  . Lumbar back pain   . NASH (nonalcoholic steatohepatitis)   . Numbness   . OSA (obstructive sleep apnea) 11/19/2010  . Sleep apnea    Pt states mild case, doesn't use cpap   Allergies: No Known Allergies   Review of Systems:  Review of Systems  Constitutional: Negative for chills, diaphoresis, fever, malaise/fatigue and weight loss.  HENT: Negative  for congestion, ear pain, hearing loss, sinus pain, sore throat and tinnitus.   Eyes: Negative.   Respiratory: Negative for cough, shortness of breath and wheezing.   Cardiovascular: Negative for chest pain, palpitations and leg swelling.  Gastrointestinal: Negative for blood in stool, constipation, diarrhea, heartburn, melena, nausea and vomiting.  Genitourinary: Negative for urgency.  Neurological: Negative for dizziness, tremors, focal weakness and headaches.  Psychiatric/Behavioral: Negative for depression and substance abuse. The patient is not nervous/anxious and does not have insomnia.     Family history- Review and unchanged  Social history- Review and unchanged  Physical Exam: BP 140/90   Pulse 89   Temp 97.7 F (36.5 C)   Ht 5' 11.5" (1.816 m)   Wt 226 lb (102.5 kg)   SpO2 97%   BMI 31.08 kg/m  Wt Readings from Last 3 Encounters:  09/12/18 226 lb (102.5 kg)  05/10/18 226 lb 12.8 oz (102.9 kg)  01/25/18 225 lb (102.1 kg)   General Appearance: Well nourished well developed, non-toxic  appearing, in no apparent distress. Eyes: PERRLA, horizontal nystagmus with a rotational component, No conjunctival swelling or erythema ENT/Mouth: Ear canals clear with no erythema, swelling, or discharge.  TMs normal bilaterally, oropharynx clear, moist, with no exudate.   Neck: Supple, thyroid normal, no JVD, no cervical adenopathy.  Respiratory: Respiratory effort normal, breath sounds clear A&P, no wheeze, rhonchi or rales noted.  No retractions, no accessory muscle usage Cardio: RRR with no MRGs. No noted edema.  Abdomen: Soft, + BS.  Non tender, no guarding, rebound, hernias, masses. Musculoskeletal: Full ROM, 5/5 strength, Normal gait. R 2nd digit crosses over 3rd digit permanently.  Skin: Warm, dry without rashes, lesions, ecchymosis.  Neuro: Awake and oriented X 3, Cranial nerves intact. No cerebellar symptoms.  Normal gait, normal strength.  Psych: normal affect, Insight and Judgment appropriate.    Izora Ribas, NP 3:32 PM [facility]

## 2018-09-12 ENCOUNTER — Other Ambulatory Visit: Payer: Self-pay

## 2018-09-12 ENCOUNTER — Encounter: Payer: Self-pay | Admitting: Adult Health

## 2018-09-12 ENCOUNTER — Ambulatory Visit (INDEPENDENT_AMBULATORY_CARE_PROVIDER_SITE_OTHER): Payer: Managed Care, Other (non HMO) | Admitting: Adult Health

## 2018-09-12 VITALS — BP 140/90 | HR 89 | Temp 97.7°F | Ht 71.5 in | Wt 226.0 lb

## 2018-09-12 DIAGNOSIS — Z789 Other specified health status: Secondary | ICD-10-CM | POA: Insufficient documentation

## 2018-09-12 DIAGNOSIS — E559 Vitamin D deficiency, unspecified: Secondary | ICD-10-CM | POA: Diagnosis not present

## 2018-09-12 DIAGNOSIS — Z79899 Other long term (current) drug therapy: Secondary | ICD-10-CM

## 2018-09-12 DIAGNOSIS — E1169 Type 2 diabetes mellitus with other specified complication: Secondary | ICD-10-CM | POA: Diagnosis not present

## 2018-09-12 DIAGNOSIS — K219 Gastro-esophageal reflux disease without esophagitis: Secondary | ICD-10-CM | POA: Diagnosis not present

## 2018-09-12 DIAGNOSIS — E119 Type 2 diabetes mellitus without complications: Secondary | ICD-10-CM | POA: Diagnosis not present

## 2018-09-12 DIAGNOSIS — E039 Hypothyroidism, unspecified: Secondary | ICD-10-CM | POA: Diagnosis not present

## 2018-09-12 DIAGNOSIS — E785 Hyperlipidemia, unspecified: Secondary | ICD-10-CM | POA: Diagnosis not present

## 2018-09-12 DIAGNOSIS — E669 Obesity, unspecified: Secondary | ICD-10-CM

## 2018-09-12 DIAGNOSIS — Z794 Long term (current) use of insulin: Secondary | ICD-10-CM

## 2018-09-12 DIAGNOSIS — F32A Depression, unspecified: Secondary | ICD-10-CM

## 2018-09-12 DIAGNOSIS — F329 Major depressive disorder, single episode, unspecified: Secondary | ICD-10-CM

## 2018-09-12 DIAGNOSIS — I10 Essential (primary) hypertension: Secondary | ICD-10-CM

## 2018-09-12 MED ORDER — ROSUVASTATIN CALCIUM 40 MG PO TABS: 40.0000 mg | ORAL_TABLET | Freq: Every day | ORAL | 1 refills | Status: DC

## 2018-09-12 MED ORDER — INSULIN ISOPHANE & REGULAR (HUMAN 70-30)100 UNIT/ML KWIKPEN: PEN_INJECTOR | SUBCUTANEOUS | 11 refills | Status: DC

## 2018-09-12 MED ORDER — LOSARTAN POTASSIUM-HCTZ 100-25 MG PO TABS: ORAL_TABLET | ORAL | 0 refills | Status: DC

## 2018-09-12 MED ORDER — FAMOTIDINE 20 MG PO TABS: 20.0000 mg | ORAL_TABLET | Freq: Two times a day (BID) | ORAL | 1 refills | Status: DC

## 2018-09-12 NOTE — Patient Instructions (Addendum)
Goals    . Blood Pressure < 130/80    . DIET - INCREASE WATER INTAKE     Water or sugar free clear beverage 80+ fluid ounces daily     . Exercise 3x per week (30 min per time)     A brisk 15-20 min walk after a meal can improve sensitivity     . Fasting Blood Glucose <150         Try switching to stevia/truvia - avoid sugar, flour (breads, pastas, pastries) starches potatoes Try to be extra strict with diet until we can get better control of your sugars - stick to lean proteins, fibrous veggies, nuts, seeds, etc  Try taking 1 tab metformin with dinner - wait 2 weeks if doing ok - increase to 2 tabs   For diarrhea can add imodium  Start with 20 units insulin with dinner, then slowly increase by 2-3 units each day until fasting glucose is <150, then end goal <130     When it comes to diets, agreement about the perfect plan isn't easy to find, even among the experts. Experts at the [place] developed an idea known as the Healthy Eating Plate. Just imagine a plate divided into logical, healthy portions.  The emphasis is on diet quality:  Load up on vegetables and fruits - one-half of your plate: Aim for color and variety, and remember that potatoes don't count.  Go for whole grains - one-quarter of your plate: Whole wheat, barley, wheat berries, quinoa, oats, brown rice, and foods made with them. If you want pasta, go with whole wheat pasta.  Protein power - one-quarter of your plate: Fish, chicken, beans, and nuts are all healthy, versatile protein sources. Limit red meat.  The diet, however, does go beyond the plate, offering a few other suggestions.  Use healthy plant oils, such as olive, canola, soy, corn, sunflower and peanut. Check the labels, and avoid partially hydrogenated oil, which have unhealthy trans fats.  If you're thirsty, drink water. Coffee and tea are good in moderation, but skip sugary drinks and limit milk and dairy products to one or  two daily servings.  The type of carbohydrate in the diet is more important than the amount. Some sources of carbohydrates, such as vegetables, fruits, whole grains, and beans-are healthier than others.  Finally, stay active.      GETTING OFF OF PPI's    Nexium/protonix/prilosec/Omeprazole/Dexilant/Aciphex are called PPI's, they are great at healing your stomach but should only be taken for a short period of time.     Recent studies have shown that taken for a long time they  can increase the risk of osteoporosis (weakening of your bones), pneumonia, low magnesium, restless legs, Cdiff (infection that causes diarrhea), DEMENTIA and most recently kidney damage / disease / insufficiency.     Due to this information we want to try to stop the PPI but if you try to stop it abruptly this can cause rebound acid and worsening symptoms.   So this is how we want you to get off the PPI: Generic is always fine!!  - Start taking the nexium/protonix/prilosec/PPI  every other day with  zantac (ranitidine) OR pepcid famotadine 2 x a day for 2-4 weeks - some people stay on this dosage and can not taper off further. Our main goal is to limit the dosage and amount you are taking so if you need to stay on this dose.   - then decrease the  PPI to every 3 days while taking the zantac or pepcid 300mg  twice a day the other  days for 2-4  Weeks  - then you can try the zantac or pepcid 300mg  once at night or up to 2 x day as needed.  - you can continue on this once at night or stop all together  - Avoid alcohol, spicy foods, NSAIDS (aleve, ibuprofen) at this time. See foods below.   +++++++++++++++++++++++++++++++++++++++++++  Food Choices for Gastroesophageal Reflux Disease  When you have gastroesophageal reflux disease (GERD), the foods you eat and your eating habits are very important. Choosing the right foods can help ease the discomfort of GERD. WHAT GENERAL GUIDELINES DO I NEED TO FOLLOW?  Choose  fruits, vegetables, whole grains, low-fat dairy products, and low-fat meat, fish, and poultry.  Limit fats such as oils, salad dressings, butter, nuts, and avocado.  Keep a food diary to identify foods that cause symptoms.  Avoid foods that cause reflux. These may be different for different people.  Eat frequent small meals instead of three large meals each day.  Eat your meals slowly, in a relaxed setting.  Limit fried foods.  Cook foods using methods other than frying.  Avoid drinking alcohol.  Avoid drinking large amounts of liquids with your meals.  Avoid bending over or lying down until 2-3 hours after eating.   WHAT FOODS ARE NOT RECOMMENDED? The following are some foods and drinks that may worsen your symptoms:  Vegetables Tomatoes. Tomato juice. Tomato and spaghetti sauce. Chili peppers. Onion and garlic. Horseradish. Fruits Oranges, grapefruit, and lemon (fruit and juice). Meats High-fat meats, fish, and poultry. This includes hot dogs, ribs, ham, sausage, salami, and bacon. Dairy Whole milk and chocolate milk. Sour cream. Cream. Butter. Ice cream. Cream cheese.  Beverages Coffee and tea, with or without caffeine. Carbonated beverages or energy drinks. Condiments Hot sauce. Barbecue sauce.  Sweets/Desserts Chocolate and cocoa. Donuts. Peppermint and spearmint. Fats and Oils High-fat foods, including [place] fries and potato chips. Other Vinegar. Strong spices, such as black pepper, white pepper, red pepper, cayenne, curry powder, cloves, ginger, and chili powder.

## 2018-09-13 ENCOUNTER — Other Ambulatory Visit: Payer: Self-pay | Admitting: Adult Health

## 2018-09-13 ENCOUNTER — Other Ambulatory Visit: Payer: Self-pay

## 2018-09-13 ENCOUNTER — Encounter: Payer: Self-pay | Admitting: Adult Health

## 2018-09-13 DIAGNOSIS — R7989 Other specified abnormal findings of blood chemistry: Secondary | ICD-10-CM

## 2018-09-13 DIAGNOSIS — E039 Hypothyroidism, unspecified: Secondary | ICD-10-CM

## 2018-09-13 DIAGNOSIS — E119 Type 2 diabetes mellitus without complications: Secondary | ICD-10-CM

## 2018-09-13 MED ORDER — VITAMIN D3 75 MCG (3000 UT) PO TABS: 8000.0000 [IU] | ORAL_TABLET | Freq: Every day | ORAL | Status: DC

## 2018-09-13 MED ORDER — LEVOTHYROXINE SODIUM 137 MCG PO TABS: ORAL_TABLET | ORAL | 0 refills | Status: DC

## 2018-09-13 MED ORDER — NOVOLIN 70/30 FLEXPEN (70-30) 100 UNIT/ML ~~LOC~~ SUPN: PEN_INJECTOR | SUBCUTANEOUS | 11 refills | Status: DC

## 2018-09-14 LAB — CBC WITH DIFFERENTIAL/PLATELET
Absolute Monocytes: 656 cells/uL (ref 200–950)
Basophils Absolute: 87 cells/uL (ref 0–200)
Basophils Relative: 1.1 %
Eosinophils Absolute: 292 cells/uL (ref 15–500)
Eosinophils Relative: 3.7 %
HCT: 44.5 % (ref 38.5–50.0)
Hemoglobin: 16.1 g/dL (ref 13.2–17.1)
Lymphs Abs: 2307 cells/uL (ref 850–3900)
MCH: 32.9 pg (ref 27.0–33.0)
MCHC: 36.2 g/dL — ABNORMAL HIGH (ref 32.0–36.0)
MCV: 91 fL (ref 80.0–100.0)
MPV: 11 fL (ref 7.5–12.5)
Monocytes Relative: 8.3 %
Neutro Abs: 4558 cells/uL (ref 1500–7800)
Neutrophils Relative %: 57.7 %
Platelets: 229 10*3/uL (ref 140–400)
RBC: 4.89 10*6/uL (ref 4.20–5.80)
RDW: 12 % (ref 11.0–15.0)
Total Lymphocyte: 29.2 %
WBC: 7.9 10*3/uL (ref 3.8–10.8)

## 2018-09-14 LAB — COMPLETE METABOLIC PANEL WITH GFR
AG Ratio: 1.5 (calc) (ref 1.0–2.5)
ALT: 64 U/L — ABNORMAL HIGH (ref 9–46)
AST: 44 U/L — ABNORMAL HIGH (ref 10–35)
Albumin: 4.7 g/dL (ref 3.6–5.1)
Alkaline phosphatase (APISO): 83 U/L (ref 35–144)
BUN: 18 mg/dL (ref 7–25)
CO2: 25 mmol/L (ref 20–32)
Calcium: 9.9 mg/dL (ref 8.6–10.3)
Chloride: 98 mmol/L (ref 98–110)
Creat: 0.85 mg/dL (ref 0.70–1.33)
GFR, Est African American: 114 mL/min/{1.73_m2} (ref >=60)
GFR, Est Non African American: 98 mL/min/{1.73_m2} (ref >=60)
Globulin: 3.2 g/dL (calc) (ref 1.9–3.7)
Glucose, Bld: 168 mg/dL — ABNORMAL HIGH (ref 65–99)
Potassium: 3.9 mmol/L (ref 3.5–5.3)
Sodium: 134 mmol/L — ABNORMAL LOW (ref 135–146)
Total Bilirubin: 0.6 mg/dL (ref 0.2–1.2)
Total Protein: 7.9 g/dL (ref 6.1–8.1)

## 2018-09-14 LAB — LIPID PANEL
Cholesterol: 214 mg/dL — ABNORMAL HIGH (ref <200)
HDL: 42 mg/dL (ref >=40)
LDL Cholesterol (Calc): 120 mg/dL (calc) — ABNORMAL HIGH
Non-HDL Cholesterol (Calc): 172 mg/dL (calc) — ABNORMAL HIGH (ref <130)
Total CHOL/HDL Ratio: 5.1 (calc) — ABNORMAL HIGH (ref <5.0)
Triglycerides: 363 mg/dL — ABNORMAL HIGH (ref <150)

## 2018-09-14 LAB — GAMMA GT: GGT: 263 U/L — ABNORMAL HIGH (ref 3–85)

## 2018-09-14 LAB — TEST AUTHORIZATION

## 2018-09-14 LAB — HEMOGLOBIN A1C
Hgb A1c MFr Bld: 11.1 % of total Hgb — ABNORMAL HIGH (ref <5.7)
Mean Plasma Glucose: 272 (calc)
eAG (mmol/L): 15.1 (calc)

## 2018-09-14 LAB — MAGNESIUM: Magnesium: 1.9 mg/dL (ref 1.5–2.5)

## 2018-09-14 LAB — TSH: TSH: 11.49 mIU/L — ABNORMAL HIGH (ref 0.40–4.50)

## 2018-09-14 LAB — VITAMIN D 25 HYDROXY (VIT D DEFICIENCY, FRACTURES): Vit D, 25-Hydroxy: 35 ng/mL (ref 30–100)

## 2018-10-01 ENCOUNTER — Encounter: Payer: Self-pay | Admitting: Internal Medicine

## 2018-10-23 ENCOUNTER — Other Ambulatory Visit: Payer: Managed Care, Other (non HMO)

## 2018-10-23 ENCOUNTER — Other Ambulatory Visit: Payer: Self-pay

## 2018-10-23 DIAGNOSIS — R7989 Other specified abnormal findings of blood chemistry: Secondary | ICD-10-CM

## 2018-10-23 DIAGNOSIS — E039 Hypothyroidism, unspecified: Secondary | ICD-10-CM

## 2018-10-23 DIAGNOSIS — Z789 Other specified health status: Secondary | ICD-10-CM

## 2018-10-24 ENCOUNTER — Other Ambulatory Visit: Payer: Self-pay | Admitting: Adult Health

## 2018-10-24 DIAGNOSIS — R7989 Other specified abnormal findings of blood chemistry: Secondary | ICD-10-CM

## 2018-10-24 LAB — TSH: TSH: 5.79 mIU/L — ABNORMAL HIGH (ref 0.40–4.50)

## 2018-10-24 LAB — HEPATITIS PANEL, ACUTE
Hep A IgM: NONREACTIVE
Hep B C IgM: NONREACTIVE
Hepatitis B Surface Ag: NONREACTIVE
Hepatitis C Ab: NONREACTIVE
SIGNAL TO CUT-OFF: 0.02 (ref <1.00)

## 2018-10-24 LAB — HEPATIC FUNCTION PANEL
AG Ratio: 1.6 (calc) (ref 1.0–2.5)
ALT: 57 U/L — ABNORMAL HIGH (ref 9–46)
AST: 35 U/L (ref 10–35)
Albumin: 4.8 g/dL (ref 3.6–5.1)
Alkaline phosphatase (APISO): 98 U/L (ref 35–144)
Bilirubin, Direct: 0.2 mg/dL (ref 0.0–0.2)
Globulin: 3 g/dL (calc) (ref 1.9–3.7)
Indirect Bilirubin: 0.5 mg/dL (calc) (ref 0.2–1.2)
Total Bilirubin: 0.7 mg/dL (ref 0.2–1.2)
Total Protein: 7.8 g/dL (ref 6.1–8.1)

## 2018-10-24 MED ORDER — LEVOTHYROXINE SODIUM 137 MCG PO TABS: ORAL_TABLET | ORAL | 0 refills | Status: DC

## 2018-11-01 ENCOUNTER — Ambulatory Visit
Admission: RE | Admit: 2018-11-01 | Discharge: 2018-11-01 | Disposition: A | Discharge status: Home / Self Care | Payer: Managed Care, Other (non HMO) | Source: Ambulatory Visit | Attending: Adult Health | Admitting: Adult Health

## 2018-11-01 ENCOUNTER — Other Ambulatory Visit: Payer: Self-pay | Admitting: Internal Medicine

## 2018-11-01 ENCOUNTER — Encounter: Payer: Self-pay | Admitting: Adult Health

## 2018-11-01 DIAGNOSIS — R7989 Other specified abnormal findings of blood chemistry: Secondary | ICD-10-CM

## 2018-11-01 DIAGNOSIS — I77811 Abdominal aortic ectasia: Secondary | ICD-10-CM | POA: Insufficient documentation

## 2018-11-01 DIAGNOSIS — K7581 Nonalcoholic steatohepatitis (NASH): Secondary | ICD-10-CM | POA: Insufficient documentation

## 2018-11-07 ENCOUNTER — Other Ambulatory Visit: Payer: Self-pay | Admitting: Internal Medicine

## 2018-11-14 ENCOUNTER — Other Ambulatory Visit: Payer: Self-pay | Admitting: Internal Medicine

## 2018-11-14 MED ORDER — CEPHALEXIN 500 MG PO CAPS: ORAL_CAPSULE | ORAL | 0 refills | Status: DC

## 2018-11-15 ENCOUNTER — Encounter: Payer: Self-pay | Admitting: Internal Medicine

## 2018-11-19 DIAGNOSIS — N5203 Combined arterial insufficiency and corporo-venous occlusive erectile dysfunction: Secondary | ICD-10-CM | POA: Insufficient documentation

## 2018-11-25 ENCOUNTER — Other Ambulatory Visit: Payer: Self-pay | Admitting: Internal Medicine

## 2018-11-25 MED ORDER — GABAPENTIN 800 MG PO TABS: ORAL_TABLET | ORAL | 0 refills | Status: DC

## 2018-12-31 ENCOUNTER — Encounter: Payer: Self-pay | Admitting: Internal Medicine

## 2019-01-24 ENCOUNTER — Ambulatory Visit: Payer: Managed Care, Other (non HMO) | Admitting: Adult Health

## 2019-02-27 ENCOUNTER — Other Ambulatory Visit: Payer: Self-pay | Admitting: Internal Medicine

## 2019-02-27 ENCOUNTER — Other Ambulatory Visit: Payer: Self-pay | Admitting: Adult Health

## 2019-04-13 ENCOUNTER — Other Ambulatory Visit: Payer: Self-pay | Admitting: Internal Medicine

## 2019-05-23 ENCOUNTER — Encounter: Payer: Self-pay | Admitting: Internal Medicine

## 2019-05-30 ENCOUNTER — Encounter: Payer: Self-pay | Admitting: Internal Medicine

## 2019-05-30 NOTE — Progress Notes (Signed)
Annual  Screening/Preventative Visit  & Comprehensive Evaluation & Examination     This very nice 54 y.o. MWM presents for a Screening /Preventative Visit & comprehensive evaluation and management of multiple medical co-morbidities.  Patient has been followed for HTN, HLD, T2_IDDM, Hypothyroidism  and Vitamin D Deficiency.     HTN predates since age 1 yo (39). Patient's BP has been controlled at home.  Today's BP: (!) 142/84. Patient denies any cardiac symptoms as chest pain, palpitations, shortness of breath, dizziness or ankle swelling.     Patient's hyperlipidemia is not controlled with diet and after last labs, he was switched from Atorvastatin to Rosuvastatin.  Patient denies myalgias or other medication SE's. Last lipids were not at goal:  Lab Results  Component Value Date   CHOL 214 (H) 09/12/2018   HDL 42 09/12/2018   LDLCALC 120 (H) 09/12/2018   LDLDIRECT 120.3 11/07/2006   TRIG 363 (H) 09/12/2018   CHOLHDL 5.1 (H) 09/12/2018       Patient has hx/o T2_IDDM since 2000 (age 32 yo) and patient denies reactive hypoglycemic symptoms, visual blurring, diabetic polys or paresthesias. Patient is poorly compliant with meds, diet & OV's as is reflected by his last OV was was 8 months ago in June 2020. His last  A1c then was not at goal:  Lab Results  Component Value Date   HGBA1C 11.1 (H) 09/12/2018       Patient underwent Thyroidectomy in 2002 for Goiter and has been on Thyroid Replacement.       Finally, patient has history of Vitamin D Deficiency ("20" / 2017)  And patient does not supplement as advised so his  last vitamin D was still very low:  Lab Results  Component Value Date   VD25OH 35 09/12/2018    Current Outpatient Medications on File Prior to Visit  Medication Sig  . aspirin 81 MG tablet Take 81 mg by mouth daily.    . blood glucose meter kit and supplies KIT Dispense based on patient and insurance preference. Use up to 3 times daily as directed. (FOR  ICD-10-E11.9 and Z79.4).  . citalopram (CELEXA) 40 MG tablet Take 1 tablet Daily for Mood  . famotidine (PEPCID) 20 MG tablet Take 1 tablet (20 mg total) by mouth 2 (two) times daily.  . Insulin Isophane & Regular Human (NOVOLIN 70/30 FLEXPEN) (70-30) 100 UNIT/ML PEN Take 40 units with breakfast and 20-35 units with dinner (start with 20 units with dinner, slowly titrate up until fasting glucose is <150).  Marland Kitchen levothyroxine (SYNTHROID) 137 MCG tablet Take 1 tablet daily on an empty stomach with only water for 30 minutes & no Antacid meds, Calcium or Magnesium for 4 hours & avoid Biotin  . losartan-hydrochlorothiazide (HYZAAR) 100-25 MG tablet Take 1 tablet Daily for BP  . metFORMIN (GLUCOPHAGE-XR) 500 MG 24 hr tablet Take 2 tablets 2 x /daily with Meals for Diabetes  . MILK THISTLE PO Take 280 mg by mouth.  Marland Kitchen omeprazole (PRILOSEC) 20 MG capsule Take 1 capsule Daily for Indigestion & Reflux  . rosuvastatin (CRESTOR) 40 MG tablet Take 1 tablet (40 mg total) by mouth daily.  Marland Kitchen VITAMIN D PO Take 2,000 Units by mouth in the morning and at bedtime.  . gabapentin (NEURONTIN) 800 MG tablet Take 1/2 to 1 tablet 2 to 3 x /day for Back Pain (Patient not taking: Reported on 05/31/2019)   No current facility-administered medications on file prior to visit.   No Known Allergies Past Medical  History:  Diagnosis Date  . Anxiety   . ASTHMA 10/28/2006  . Cough   . Diabetes mellitus type II 2000   age 36  . GERD (gastroesophageal reflux disease)   . Hyperlipidemia   . Hypertension 1995  . Hypothyroidism 2002   thyroidectomy for Goiter  . Lumbar back pain   . NASH (nonalcoholic steatohepatitis)   . Numbness   . OSA (obstructive sleep apnea) 11/19/2010  . Sleep apnea    Pt states mild case, doesn't use cpap   Health Maintenance  Topic Date Due  . OPHTHALMOLOGY EXAM  06/01/1973  . HIV Screening  06/01/1978  . INFLUENZA VACCINE  11/03/2018  . HEMOGLOBIN A1C  03/14/2019  . FOOT EXAM  05/29/2020  .  COLONOSCOPY  09/23/2020  . TETANUS/TDAP  08/09/2025  . PNEUMOCOCCAL POLYSACCHARIDE VACCINE AGE 18-64 HIGH RISK  Completed  . Hepatitis C Screening  Completed   Immunization History  Administered Date(s) Administered  . Influenza Inj Mdck Quad With Preservative 02/20/2017  . Influenza Split 01/16/2014  . PPD Test 10/11/2017  . Pneumococcal Polysaccharide-23 08/16/2012  . Tdap 08/10/2015   Last Colon -  09/24/2015 - Dr Fuller Plan - recc f/u 5 yr - June 2022  Past Surgical History:  Procedure Laterality Date  . bilateral shoulder surgery    . right knee repair  1997  . THYROIDECTOMY     right  . TRIGGER FINGER RELEASE    . TRIGGER FINGER RELEASE  02/23/2011   Procedure: RELEASE TRIGGER FINGER/A-1 PULLEY;  Surgeon: Meredith Pel;  Location: [facility];  Service: Orthopedics;  Laterality: Right;  Right 4th trigger finger release   Family History  Problem Relation Age of Onset  . Osteoarthritis Father   . Heart attack Father   . Skin cancer Father   . Breast cancer Mother   . Colon cancer Neg Hx   . Esophageal cancer Neg Hx   . Rectal cancer Neg Hx   . Stomach cancer Neg Hx    Social History   Socioeconomic History  . Marital status: Married    Spouse name: Corporate treasurer  . Number of children: 2  Occupational History  . Occupation: Barrister's clerk    Comment: insurance company  Tobacco Use  . Smoking status: Never Smoker  . Smokeless tobacco: Never Used  Substance and Sexual Activity  . Alcohol use: Yes    Alcohol/week: 0.0 standard drinks    Comment: 20 beers per week  . Drug use: No  . Sexual activity: Not on file    ROS Constitutional: Denies fever, chills, weight loss/gain, headaches, insomnia,  night sweats or change in appetite. Does c/o fatigue. Eyes: Denies redness, blurred vision, diplopia, discharge, itchy or watery eyes.  ENT: Denies discharge, congestion, post nasal drip, epistaxis, sore throat, earache, hearing loss, dental pain, Tinnitus, Vertigo, Sinus  pain or snoring.  Cardio: Denies chest pain, palpitations, irregular heartbeat, syncope, dyspnea, diaphoresis, orthopnea, PND, claudication or edema Respiratory: denies cough, dyspnea, DOE, pleurisy, hoarseness, laryngitis or wheezing.  Gastrointestinal: Denies dysphagia, heartburn, reflux, water brash, pain, cramps, nausea, vomiting, bloating, diarrhea, constipation, hematemesis, melena, hematochezia, jaundice or hemorrhoids Genitourinary: Denies dysuria, frequency, urgency, nocturia, hesitancy, discharge, hematuria or flank pain Musculoskeletal: Denies arthralgia, myalgia, stiffness, Jt. Swelling, pain, limp or strain/sprain. Denies Falls. Skin: Denies puritis, rash, hives, warts, acne, eczema or change in skin lesion Neuro: No weakness, tremor, incoordination, spasms, paresthesia or pain Psychiatric: Denies confusion, memory loss or sensory loss. Denies Depression. Endocrine: Denies change in  weight, skin, hair change, nocturia, and paresthesia, diabetic polys, visual blurring or hyper / hypo glycemic episodes.  Heme/Lymph: No excessive bleeding, bruising or enlarged lymph nodes.  Physical Exam  BP (!) 142/84   Pulse 96   Temp (!) 97.2 F (36.2 C)   Resp 16   Ht '5\' 11"'$  (1.803 m)   Wt 221 lb 6.4 oz (100.4 kg)   BMI 30.88 kg/m   General Appearance: Well nourished and well groomed and in no apparent distress.  Eyes: PERRLA, EOMs, conjunctiva no swelling or erythema, normal fundi and vessels. Sinuses: No frontal/maxillary tenderness ENT/Mouth: EACs patent / TMs  nl. Nares clear without erythema, swelling, mucoid exudates. Oral hygiene is good. No erythema, swelling, or exudate. Tongue normal, non-obstructing. Tonsils not swollen or erythematous. Hearing normal.  Neck: Supple, thyroid not palpable. No bruits, nodes or JVD. Respiratory: Respiratory effort normal.  BS equal and clear bilateral without rales, rhonci, wheezing or stridor. Cardio: Heart sounds are normal with regular rate and  rhythm and no murmurs, rubs or gallops. Peripheral pulses are normal and equal bilaterally without edema. No aortic or femoral bruits. Chest: symmetric with normal excursions and percussion.  Abdomen: Soft, with Nl bowel sounds. Nontender, no guarding, rebound, hernias, masses, or organomegaly.  Lymphatics: Non tender without lymphadenopathy.  Musculoskeletal: Full ROM all peripheral extremities, joint stability, 5/5 strength, and normal gait. Skin: Warm and dry without rashes, lesions, cyanosis, clubbing or  ecchymosis.  Neuro: Cranial nerves intact, reflexes equal bilaterally. Normal muscle tone, no cerebellar symptoms. Sensation intact.  Pysch: Alert and oriented X 3 with normal affect, insight and judgment appropriate.   Assessment and Plan  1. Annual Preventative/Screening Exam   2. Essential hypertension  - EKG 12-Lead - [place], RETROPERITNL ABD,  LTD - Urinalysis, Routine w reflex microscopic - Microalbumin / creatinine urine ratio - CBC with Differential/Platelet - COMPLETE METABOLIC PANEL WITH GFR - Magnesium - TSH  3. Hyperlipidemia associated with type 2 diabetes mellitus ([facility])  - EKG 12-Lead - [place], RETROPERITNL ABD,  LTD - Lipid panel - TSH  4. Diabetes mellitus type 2, insulin dependent ([facility])  - EKG 12-Lead - [place], RETROPERITNL ABD,  LTD - Urinalysis, Routine w reflex microscopic - Microalbumin / creatinine urine ratio - HM DIABETES FOOT EXAM - LOW EXTREMITY NEUR EXAM DOCUM - Hemoglobin A1c - Insulin, random  5. Vitamin D deficiency  - VITAMIN D 25 Hydroxy  6. Screening examination for pulmonary tuberculosis  - TB Skin Test  7. Hypothyroidism  - TSH  8. Testosterone deficiency  - Testosterone  9. Screening for colorectal cancer  - Microalbumin / creatinine urine ratio - POC Hemoccult Bld/Stl  10. Screening for prostate cancer  - PSA  11. Screening for ischemic heart disease  - EKG 12-Lead  12. FHx: heart disease  - EKG 12-Lead - [place],  RETROPERITNL ABD,  LTD  13. Screening for AAA (aortic abdominal aneurysm)  - [place], RETROPERITNL ABD,  LTD  14. Fatigue, unspecified type  - Iron,Total/Total Iron Binding Cap - Vitamin B12 - CBC with Differential/Platelet - TSH  15. Medication management  - Urinalysis, Routine w reflex microscopic - Microalbumin / creatinine urine ratio - CBC with Differential/Platelet - COMPLETE METABOLIC PANEL WITH GFR - Magnesium - Lipid panel - TSH - Hemoglobin A1c - Insulin, random - VITAMIN D 25 Hydroxy       Patient was counseled in prudent diet, weight control to achieve/maintain BMI less than 25, BP monitoring, regular exercise and medications as discussed.  Discussed  med effects and SE's. Routine screening labs and tests as requested with regular follow-up as recommended. Over 40 minutes of exam, counseling, chart review and high complex critical decision making was performed  Kirtland Bouchard, MD

## 2019-05-30 NOTE — Patient Instructions (Addendum)

## 2019-05-31 ENCOUNTER — Other Ambulatory Visit: Payer: Self-pay

## 2019-05-31 ENCOUNTER — Ambulatory Visit: Payer: Managed Care, Other (non HMO) | Admitting: Internal Medicine

## 2019-05-31 VITALS — BP 142/84 | HR 96 | Temp 97.2°F | Resp 16 | Ht 71.0 in | Wt 221.4 lb

## 2019-05-31 DIAGNOSIS — I10 Essential (primary) hypertension: Secondary | ICD-10-CM | POA: Diagnosis not present

## 2019-05-31 DIAGNOSIS — R350 Frequency of micturition: Secondary | ICD-10-CM | POA: Diagnosis not present

## 2019-05-31 DIAGNOSIS — Z13220 Encounter for screening for lipoid disorders: Secondary | ICD-10-CM | POA: Diagnosis not present

## 2019-05-31 DIAGNOSIS — E1169 Type 2 diabetes mellitus with other specified complication: Secondary | ICD-10-CM

## 2019-05-31 DIAGNOSIS — Z131 Encounter for screening for diabetes mellitus: Secondary | ICD-10-CM | POA: Diagnosis not present

## 2019-05-31 DIAGNOSIS — Z136 Encounter for screening for cardiovascular disorders: Secondary | ICD-10-CM | POA: Diagnosis not present

## 2019-05-31 DIAGNOSIS — R5383 Other fatigue: Secondary | ICD-10-CM

## 2019-05-31 DIAGNOSIS — Z125 Encounter for screening for malignant neoplasm of prostate: Secondary | ICD-10-CM

## 2019-05-31 DIAGNOSIS — Z794 Long term (current) use of insulin: Secondary | ICD-10-CM

## 2019-05-31 DIAGNOSIS — Z79899 Other long term (current) drug therapy: Secondary | ICD-10-CM | POA: Diagnosis not present

## 2019-05-31 DIAGNOSIS — Z111 Encounter for screening for respiratory tuberculosis: Secondary | ICD-10-CM

## 2019-05-31 DIAGNOSIS — M5430 Sciatica, unspecified side: Secondary | ICD-10-CM

## 2019-05-31 DIAGNOSIS — Z8249 Family history of ischemic heart disease and other diseases of the circulatory system: Secondary | ICD-10-CM | POA: Diagnosis not present

## 2019-05-31 DIAGNOSIS — Z0000 Encounter for general adult medical examination without abnormal findings: Secondary | ICD-10-CM | POA: Diagnosis not present

## 2019-05-31 DIAGNOSIS — E559 Vitamin D deficiency, unspecified: Secondary | ICD-10-CM

## 2019-05-31 DIAGNOSIS — E349 Endocrine disorder, unspecified: Secondary | ICD-10-CM

## 2019-05-31 DIAGNOSIS — Z1329 Encounter for screening for other suspected endocrine disorder: Secondary | ICD-10-CM | POA: Diagnosis not present

## 2019-05-31 DIAGNOSIS — E119 Type 2 diabetes mellitus without complications: Secondary | ICD-10-CM

## 2019-05-31 DIAGNOSIS — Z1211 Encounter for screening for malignant neoplasm of colon: Secondary | ICD-10-CM

## 2019-05-31 DIAGNOSIS — Z130 Encounter for screening for diseases of the blood and blood-forming organs and certain disorders involving the immune mechanism: Secondary | ICD-10-CM

## 2019-05-31 DIAGNOSIS — N401 Enlarged prostate with lower urinary tract symptoms: Secondary | ICD-10-CM | POA: Diagnosis not present

## 2019-05-31 DIAGNOSIS — E039 Hypothyroidism, unspecified: Secondary | ICD-10-CM

## 2019-05-31 DIAGNOSIS — Z1389 Encounter for screening for other disorder: Secondary | ICD-10-CM

## 2019-05-31 DIAGNOSIS — Z0001 Encounter for general adult medical examination with abnormal findings: Secondary | ICD-10-CM

## 2019-05-31 MED ORDER — PREDNISONE 20 MG PO TABS: ORAL_TABLET | ORAL | 0 refills | Status: DC

## 2019-06-01 ENCOUNTER — Encounter: Payer: Self-pay | Admitting: Internal Medicine

## 2019-06-03 LAB — URINALYSIS, ROUTINE W REFLEX MICROSCOPIC
Bilirubin Urine: NEGATIVE
Hgb urine dipstick: NEGATIVE
Leukocytes,Ua: NEGATIVE
Nitrite: NEGATIVE
Protein, ur: NEGATIVE
Specific Gravity, Urine: 1.032 (ref 1.001–1.03)
pH: <=5 (ref 5.0–8.0)

## 2019-06-03 LAB — CBC WITH DIFFERENTIAL/PLATELET
Absolute Monocytes: 537 cells/uL (ref 200–950)
Basophils Absolute: 68 cells/uL (ref 0–200)
Basophils Relative: 1 %
Eosinophils Absolute: 299 cells/uL (ref 15–500)
Eosinophils Relative: 4.4 %
HCT: 45.7 % (ref 38.5–50.0)
Hemoglobin: 16.1 g/dL (ref 13.2–17.1)
Lymphs Abs: 2081 cells/uL (ref 850–3900)
MCH: 32.3 pg (ref 27.0–33.0)
MCHC: 35.2 g/dL (ref 32.0–36.0)
MCV: 91.8 fL (ref 80.0–100.0)
MPV: 11.2 fL (ref 7.5–12.5)
Monocytes Relative: 7.9 %
Neutro Abs: 3815 cells/uL (ref 1500–7800)
Neutrophils Relative %: 56.1 %
Platelets: 208 10*3/uL (ref 140–400)
RBC: 4.98 10*6/uL (ref 4.20–5.80)
RDW: 12 % (ref 11.0–15.0)
Total Lymphocyte: 30.6 %
WBC: 6.8 10*3/uL (ref 3.8–10.8)

## 2019-06-03 LAB — COMPLETE METABOLIC PANEL WITH GFR
AG Ratio: 1.7 (calc) (ref 1.0–2.5)
ALT: 29 U/L (ref 9–46)
AST: 22 U/L (ref 10–35)
Albumin: 5 g/dL (ref 3.6–5.1)
Alkaline phosphatase (APISO): 104 U/L (ref 35–144)
BUN/Creatinine Ratio: 27 (calc) — ABNORMAL HIGH (ref 6–22)
BUN: 26 mg/dL — ABNORMAL HIGH (ref 7–25)
CO2: 24 mmol/L (ref 20–32)
Calcium: 10.1 mg/dL (ref 8.6–10.3)
Chloride: 93 mmol/L — ABNORMAL LOW (ref 98–110)
Creat: 0.97 mg/dL (ref 0.70–1.33)
GFR, Est African American: 101 mL/min/{1.73_m2} (ref >=60)
GFR, Est Non African American: 88 mL/min/{1.73_m2} (ref >=60)
Globulin: 2.9 g/dL (calc) (ref 1.9–3.7)
Glucose, Bld: 233 mg/dL — ABNORMAL HIGH (ref 65–99)
Potassium: 4.1 mmol/L (ref 3.5–5.3)
Sodium: 129 mmol/L — ABNORMAL LOW (ref 135–146)
Total Bilirubin: 0.8 mg/dL (ref 0.2–1.2)
Total Protein: 7.9 g/dL (ref 6.1–8.1)

## 2019-06-03 LAB — IRON, TOTAL/TOTAL IRON BINDING CAP
%SAT: 29 % (calc) (ref 20–48)
Iron: 101 ug/dL (ref 50–180)
TIBC: 350 mcg/dL (calc) (ref 250–425)

## 2019-06-03 LAB — MICROALBUMIN / CREATININE URINE RATIO
Creatinine, Urine: 82 mg/dL (ref 20–320)
Microalb Creat Ratio: 70 mcg/mg creat — ABNORMAL HIGH (ref <30)
Microalb, Ur: 5.7 mg/dL

## 2019-06-03 LAB — VITAMIN D 25 HYDROXY (VIT D DEFICIENCY, FRACTURES): Vit D, 25-Hydroxy: 26 ng/mL — ABNORMAL LOW (ref 30–100)

## 2019-06-03 LAB — TSH: TSH: 4.5 mIU/L (ref 0.40–4.50)

## 2019-06-03 LAB — LIPID PANEL
Cholesterol: 168 mg/dL (ref <200)
HDL: 40 mg/dL (ref >=40)
Non-HDL Cholesterol (Calc): 128 mg/dL (calc) (ref <130)
Total CHOL/HDL Ratio: 4.2 (calc) (ref <5.0)
Triglycerides: 414 mg/dL — ABNORMAL HIGH (ref <150)

## 2019-06-03 LAB — HEMOGLOBIN A1C
Hgb A1c MFr Bld: 12.2 % of total Hgb — ABNORMAL HIGH (ref <5.7)
Mean Plasma Glucose: 303 (calc)
eAG (mmol/L): 16.8 (calc)

## 2019-06-03 LAB — PSA: PSA: 0.9 ng/mL (ref <=4.0)

## 2019-06-03 LAB — INSULIN, RANDOM: Insulin: 29 u[IU]/mL — ABNORMAL HIGH

## 2019-06-03 LAB — VITAMIN B12: Vitamin B-12: 546 pg/mL (ref 200–1100)

## 2019-06-03 LAB — MAGNESIUM: Magnesium: 2 mg/dL (ref 1.5–2.5)

## 2019-06-03 LAB — TESTOSTERONE: Testosterone: 389 ng/dL (ref 250–827)

## 2019-07-13 ENCOUNTER — Other Ambulatory Visit: Payer: Self-pay | Admitting: Adult Health

## 2019-07-13 ENCOUNTER — Other Ambulatory Visit: Payer: Self-pay | Admitting: Internal Medicine

## 2019-07-13 DIAGNOSIS — E119 Type 2 diabetes mellitus without complications: Secondary | ICD-10-CM

## 2019-07-13 MED ORDER — LEVOTHYROXINE SODIUM 137 MCG PO TABS: ORAL_TABLET | ORAL | 0 refills | Status: DC

## 2019-07-13 MED ORDER — LOSARTAN POTASSIUM-HCTZ 100-25 MG PO TABS: ORAL_TABLET | ORAL | 0 refills | Status: DC

## 2019-07-23 ENCOUNTER — Other Ambulatory Visit: Payer: Self-pay | Admitting: Physician Assistant

## 2019-07-23 MED ORDER — OMEPRAZOLE 40 MG PO CPDR: DELAYED_RELEASE_CAPSULE | ORAL | 3 refills | Status: DC

## 2019-08-12 ENCOUNTER — Ambulatory Visit: Payer: Managed Care, Other (non HMO) | Admitting: Internal Medicine

## 2019-08-25 ENCOUNTER — Encounter (HOSPITAL_BASED_OUTPATIENT_CLINIC_OR_DEPARTMENT_OTHER): Payer: Self-pay | Admitting: Emergency Medicine

## 2019-08-25 ENCOUNTER — Inpatient Hospital Stay (HOSPITAL_BASED_OUTPATIENT_CLINIC_OR_DEPARTMENT_OTHER)
Admission: EM | Admit: 2019-08-25 | Discharge: 2019-09-01 | DRG: 233 | Disposition: A | Discharge status: Home / Self Care | Payer: Managed Care, Other (non HMO) | Attending: Surgery | Admitting: Surgery

## 2019-08-25 ENCOUNTER — Emergency Department (HOSPITAL_BASED_OUTPATIENT_CLINIC_OR_DEPARTMENT_OTHER): Payer: Managed Care, Other (non HMO)

## 2019-08-25 ENCOUNTER — Other Ambulatory Visit: Payer: Self-pay

## 2019-08-25 DIAGNOSIS — Q211 Atrial septal defect: Secondary | ICD-10-CM

## 2019-08-25 DIAGNOSIS — G4733 Obstructive sleep apnea (adult) (pediatric): Secondary | ICD-10-CM | POA: Diagnosis present

## 2019-08-25 DIAGNOSIS — K219 Gastro-esophageal reflux disease without esophagitis: Secondary | ICD-10-CM | POA: Diagnosis present

## 2019-08-25 DIAGNOSIS — I440 Atrioventricular block, first degree: Secondary | ICD-10-CM | POA: Diagnosis not present

## 2019-08-25 DIAGNOSIS — I249 Acute ischemic heart disease, unspecified: Secondary | ICD-10-CM | POA: Diagnosis present

## 2019-08-25 DIAGNOSIS — I10 Essential (primary) hypertension: Secondary | ICD-10-CM | POA: Diagnosis not present

## 2019-08-25 DIAGNOSIS — I214 Non-ST elevation (NSTEMI) myocardial infarction: Principal | ICD-10-CM

## 2019-08-25 DIAGNOSIS — Z79899 Other long term (current) drug therapy: Secondary | ICD-10-CM | POA: Diagnosis not present

## 2019-08-25 DIAGNOSIS — I5041 Acute combined systolic (congestive) and diastolic (congestive) heart failure: Secondary | ICD-10-CM | POA: Diagnosis present

## 2019-08-25 DIAGNOSIS — I2510 Atherosclerotic heart disease of native coronary artery without angina pectoris: Secondary | ICD-10-CM

## 2019-08-25 DIAGNOSIS — T383X6A Underdosing of insulin and oral hypoglycemic [antidiabetic] drugs, initial encounter: Secondary | ICD-10-CM | POA: Diagnosis present

## 2019-08-25 DIAGNOSIS — D62 Acute posthemorrhagic anemia: Secondary | ICD-10-CM | POA: Diagnosis not present

## 2019-08-25 DIAGNOSIS — E1169 Type 2 diabetes mellitus with other specified complication: Secondary | ICD-10-CM

## 2019-08-25 DIAGNOSIS — J45909 Unspecified asthma, uncomplicated: Secondary | ICD-10-CM | POA: Diagnosis present

## 2019-08-25 DIAGNOSIS — Z803 Family history of malignant neoplasm of breast: Secondary | ICD-10-CM

## 2019-08-25 DIAGNOSIS — Z91128 Patient's intentional underdosing of medication regimen for other reason: Secondary | ICD-10-CM

## 2019-08-25 DIAGNOSIS — I25110 Atherosclerotic heart disease of native coronary artery with unstable angina pectoris: Secondary | ICD-10-CM | POA: Diagnosis present

## 2019-08-25 DIAGNOSIS — R5383 Other fatigue: Secondary | ICD-10-CM | POA: Diagnosis present

## 2019-08-25 DIAGNOSIS — E669 Obesity, unspecified: Secondary | ICD-10-CM

## 2019-08-25 DIAGNOSIS — Z7982 Long term (current) use of aspirin: Secondary | ICD-10-CM | POA: Diagnosis not present

## 2019-08-25 DIAGNOSIS — R778 Other specified abnormalities of plasma proteins: Secondary | ICD-10-CM

## 2019-08-25 DIAGNOSIS — E785 Hyperlipidemia, unspecified: Secondary | ICD-10-CM | POA: Diagnosis present

## 2019-08-25 DIAGNOSIS — E039 Hypothyroidism, unspecified: Secondary | ICD-10-CM | POA: Diagnosis present

## 2019-08-25 DIAGNOSIS — I340 Nonrheumatic mitral (valve) insufficiency: Secondary | ICD-10-CM | POA: Diagnosis not present

## 2019-08-25 DIAGNOSIS — D696 Thrombocytopenia, unspecified: Secondary | ICD-10-CM | POA: Diagnosis present

## 2019-08-25 DIAGNOSIS — I6521 Occlusion and stenosis of right carotid artery: Secondary | ICD-10-CM | POA: Diagnosis present

## 2019-08-25 DIAGNOSIS — Z951 Presence of aortocoronary bypass graft: Secondary | ICD-10-CM

## 2019-08-25 DIAGNOSIS — Z20822 Contact with and (suspected) exposure to covid-19: Secondary | ICD-10-CM | POA: Diagnosis present

## 2019-08-25 DIAGNOSIS — F1010 Alcohol abuse, uncomplicated: Secondary | ICD-10-CM | POA: Diagnosis present

## 2019-08-25 DIAGNOSIS — J90 Pleural effusion, not elsewhere classified: Secondary | ICD-10-CM

## 2019-08-25 DIAGNOSIS — Y92009 Unspecified place in unspecified non-institutional (private) residence as the place of occurrence of the external cause: Secondary | ICD-10-CM | POA: Diagnosis not present

## 2019-08-25 DIAGNOSIS — Z808 Family history of malignant neoplasm of other organs or systems: Secondary | ICD-10-CM

## 2019-08-25 DIAGNOSIS — Z7952 Long term (current) use of systemic steroids: Secondary | ICD-10-CM

## 2019-08-25 DIAGNOSIS — Z8249 Family history of ischemic heart disease and other diseases of the circulatory system: Secondary | ICD-10-CM | POA: Diagnosis not present

## 2019-08-25 DIAGNOSIS — I051 Rheumatic mitral insufficiency: Secondary | ICD-10-CM | POA: Diagnosis present

## 2019-08-25 DIAGNOSIS — E1165 Type 2 diabetes mellitus with hyperglycemia: Secondary | ICD-10-CM | POA: Diagnosis present

## 2019-08-25 DIAGNOSIS — Z794 Long term (current) use of insulin: Secondary | ICD-10-CM | POA: Diagnosis not present

## 2019-08-25 DIAGNOSIS — K7581 Nonalcoholic steatohepatitis (NASH): Secondary | ICD-10-CM | POA: Diagnosis present

## 2019-08-25 DIAGNOSIS — I110 Hypertensive heart disease with heart failure: Secondary | ICD-10-CM | POA: Diagnosis present

## 2019-08-25 DIAGNOSIS — Z01810 Encounter for preprocedural cardiovascular examination: Secondary | ICD-10-CM | POA: Diagnosis not present

## 2019-08-25 DIAGNOSIS — F1290 Cannabis use, unspecified, uncomplicated: Secondary | ICD-10-CM | POA: Diagnosis present

## 2019-08-25 LAB — SURGICAL PCR SCREEN
MRSA, PCR: NEGATIVE
Staphylococcus aureus: NEGATIVE

## 2019-08-25 LAB — D-DIMER, QUANTITATIVE: D-Dimer, Quant: 0.35 ug/mL-FEU (ref 0.00–0.50)

## 2019-08-25 LAB — GLUCOSE, CAPILLARY
Glucose-Capillary: 290 mg/dL — ABNORMAL HIGH (ref 70–99)
Glucose-Capillary: 295 mg/dL — ABNORMAL HIGH (ref 70–99)
Glucose-Capillary: 357 mg/dL — ABNORMAL HIGH (ref 70–99)
Glucose-Capillary: 223 mg/dL — ABNORMAL HIGH (ref 70–99)

## 2019-08-25 LAB — COMPREHENSIVE METABOLIC PANEL
ALT: 36 U/L (ref 0–44)
AST: 87 U/L — ABNORMAL HIGH (ref 15–41)
Albumin: 4.7 g/dL (ref 3.5–5.0)
Alkaline Phosphatase: 81 U/L (ref 38–126)
Anion gap: 17 — ABNORMAL HIGH (ref 5–15)
BUN: 24 mg/dL — ABNORMAL HIGH (ref 6–20)
CO2: 22 mmol/L (ref 22–32)
Calcium: 9.4 mg/dL (ref 8.9–10.3)
Chloride: 98 mmol/L (ref 98–111)
Creatinine, Ser: 1.01 mg/dL (ref 0.61–1.24)
GFR calc Af Amer: >60 mL/min (ref >60)
GFR calc non Af Amer: >60 mL/min (ref >60)
Glucose, Bld: 356 mg/dL — ABNORMAL HIGH (ref 70–99)
Potassium: 4.5 mmol/L (ref 3.5–5.1)
Sodium: 137 mmol/L (ref 135–145)
Total Bilirubin: 1.4 mg/dL — ABNORMAL HIGH (ref 0.3–1.2)
Total Protein: 8.4 g/dL — ABNORMAL HIGH (ref 6.5–8.1)

## 2019-08-25 LAB — CBC
HCT: 45.7 % (ref 39.0–52.0)
Hemoglobin: 16.4 g/dL (ref 13.0–17.0)
MCH: 32.2 pg (ref 26.0–34.0)
MCHC: 35.9 g/dL (ref 30.0–36.0)
MCV: 89.6 fL (ref 80.0–100.0)
Platelets: 225 10*3/uL (ref 150–400)
RBC: 5.1 MIL/uL (ref 4.22–5.81)
RDW: 11.9 % (ref 11.5–15.5)
WBC: 14.9 10*3/uL — ABNORMAL HIGH (ref 4.0–10.5)
nRBC: 0 % (ref 0.0–0.2)

## 2019-08-25 LAB — HIV ANTIBODY (ROUTINE TESTING W REFLEX): HIV Screen 4th Generation wRfx: NONREACTIVE

## 2019-08-25 LAB — TROPONIN I (HIGH SENSITIVITY)
Troponin I (High Sensitivity): 7922 ng/L (ref <18)
Troponin I (High Sensitivity): 13766 ng/L (ref <18)
Troponin I (High Sensitivity): 14441 ng/L (ref <18)
Troponin I (High Sensitivity): 12219 ng/L (ref <18)

## 2019-08-25 LAB — SARS CORONAVIRUS 2 BY RT PCR (HOSPITAL ORDER, PERFORMED IN ~~LOC~~ HOSPITAL LAB): SARS Coronavirus 2: NEGATIVE

## 2019-08-25 LAB — HEMOGLOBIN A1C
Hgb A1c MFr Bld: 10.8 % — ABNORMAL HIGH (ref 4.8–5.6)
Mean Plasma Glucose: 263.26 mg/dL

## 2019-08-25 LAB — HEPARIN LEVEL (UNFRACTIONATED): Heparin Unfractionated: 0.25 IU/mL — ABNORMAL LOW (ref 0.30–0.70)

## 2019-08-25 LAB — LIPASE, BLOOD: Lipase: 26 U/L (ref 11–51)

## 2019-08-25 MED ORDER — INSULIN ISOPHANE & REGULAR (HUMAN 70-30)100 UNIT/ML KWIKPEN: 40.0000 [IU] | PEN_INJECTOR | Freq: Two times a day (BID) | SUBCUTANEOUS | Status: DC

## 2019-08-25 MED ORDER — FAMOTIDINE 20 MG PO TABS
20.0000 mg | ORAL_TABLET | Freq: Two times a day (BID) | ORAL | Status: DC
  Administered 2019-08-25 – 2019-08-26 (×3): 20 mg via ORAL
  Filled 2019-08-25 (×3): qty 1

## 2019-08-25 MED ORDER — HEPARIN SODIUM (PORCINE) 5000 UNIT/ML IJ SOLN
4000.0000 [IU] | Freq: Once | INTRAMUSCULAR | Status: AC
  Administered 2019-08-25: 4000 [IU] via INTRAVENOUS
  Filled 2019-08-25: qty 1

## 2019-08-25 MED ORDER — LOSARTAN POTASSIUM-HCTZ 100-25 MG PO TABS: 1.0000 | ORAL_TABLET | Freq: Every day | ORAL | Status: DC

## 2019-08-25 MED ORDER — SODIUM CHLORIDE 0.9% FLUSH: 3.0000 mL | INTRAVENOUS | Status: DC | PRN

## 2019-08-25 MED ORDER — LEVOTHYROXINE SODIUM 25 MCG PO TABS
137.0000 ug | ORAL_TABLET | Freq: Every day | ORAL | Status: DC
  Administered 2019-08-26 – 2019-09-01 (×7): 137 ug via ORAL
  Filled 2019-08-25 (×7): qty 1

## 2019-08-25 MED ORDER — HEPARIN (PORCINE) 25000 UT/250ML-% IV SOLN
1400.0000 [IU]/h | INTRAVENOUS | Status: DC
  Administered 2019-08-25: 1300 [IU]/h via INTRAVENOUS
  Administered 2019-08-26: 1400 [IU]/h via INTRAVENOUS
  Filled 2019-08-25 (×2): qty 250

## 2019-08-25 MED ORDER — ROSUVASTATIN CALCIUM 20 MG PO TABS
40.0000 mg | ORAL_TABLET | Freq: Every day | ORAL | Status: DC
  Administered 2019-08-25 – 2019-08-31 (×6): 40 mg via ORAL
  Filled 2019-08-25 (×6): qty 2

## 2019-08-25 MED ORDER — ONDANSETRON HCL 4 MG/2ML IJ SOLN: 4.0000 mg | Freq: Four times a day (QID) | INTRAMUSCULAR | Status: DC | PRN

## 2019-08-25 MED ORDER — SODIUM CHLORIDE 0.9 % IV SOLN: 250.0000 mL | INTRAVENOUS | Status: DC | PRN

## 2019-08-25 MED ORDER — SODIUM CHLORIDE 0.9 % WEIGHT BASED INFUSION: 1.0000 mL/kg/h | INTRAVENOUS | Status: DC

## 2019-08-25 MED ORDER — CITALOPRAM HYDROBROMIDE 20 MG PO TABS
40.0000 mg | ORAL_TABLET | Freq: Every day | ORAL | Status: DC
  Administered 2019-08-25 – 2019-09-01 (×7): 40 mg via ORAL
  Filled 2019-08-25 (×7): qty 2

## 2019-08-25 MED ORDER — ASPIRIN 81 MG PO CHEW
324.0000 mg | CHEWABLE_TABLET | Freq: Once | ORAL | Status: AC
  Administered 2019-08-25: 324 mg via ORAL
  Filled 2019-08-25: qty 4

## 2019-08-25 MED ORDER — SODIUM CHLORIDE 0.9% FLUSH: 3.0000 mL | Freq: Two times a day (BID) | INTRAVENOUS | Status: DC

## 2019-08-25 MED ORDER — SODIUM CHLORIDE 0.9 % WEIGHT BASED INFUSION
3.0000 mL/kg/h | INTRAVENOUS | Status: DC
  Administered 2019-08-26: 3 mL/kg/h via INTRAVENOUS

## 2019-08-25 MED ORDER — INSULIN ASPART PROT & ASPART (70-30 MIX) 100 UNIT/ML ~~LOC~~ SUSP
40.0000 [IU] | Freq: Every day | SUBCUTANEOUS | Status: DC
  Filled 2019-08-25: qty 10

## 2019-08-25 MED ORDER — SODIUM CHLORIDE 0.9 % WEIGHT BASED INFUSION: 3.0000 mL/kg/h | INTRAVENOUS | Status: DC

## 2019-08-25 MED ORDER — LOSARTAN POTASSIUM 50 MG PO TABS: 100.0000 mg | ORAL_TABLET | Freq: Every day | ORAL | Status: DC

## 2019-08-25 MED ORDER — ASPIRIN 300 MG RE SUPP: 300.0000 mg | RECTAL | Status: AC

## 2019-08-25 MED ORDER — PANTOPRAZOLE SODIUM 40 MG PO TBEC
40.0000 mg | DELAYED_RELEASE_TABLET | Freq: Every day | ORAL | Status: DC
  Administered 2019-08-25 – 2019-08-26 (×2): 40 mg via ORAL
  Filled 2019-08-25 (×2): qty 1

## 2019-08-25 MED ORDER — VITAMIN D 25 MCG (1000 UNIT) PO TABS
1000.0000 [IU] | ORAL_TABLET | Freq: Every day | ORAL | Status: DC
  Administered 2019-08-25 – 2019-09-01 (×7): 1000 [IU] via ORAL
  Filled 2019-08-25 (×7): qty 1

## 2019-08-25 MED ORDER — HEPARIN BOLUS VIA INFUSION
1000.0000 [IU] | Freq: Once | INTRAVENOUS | Status: AC
  Administered 2019-08-25: 1000 [IU] via INTRAVENOUS
  Filled 2019-08-25: qty 1000

## 2019-08-25 MED ORDER — NITROGLYCERIN 0.4 MG SL SUBL: 0.4000 mg | SUBLINGUAL_TABLET | SUBLINGUAL | Status: DC | PRN

## 2019-08-25 MED ORDER — ONDANSETRON HCL 4 MG/2ML IJ SOLN
4.0000 mg | Freq: Once | INTRAMUSCULAR | Status: AC
  Administered 2019-08-25: 4 mg via INTRAVENOUS

## 2019-08-25 MED ORDER — INSULIN ASPART 100 UNIT/ML ~~LOC~~ SOLN
0.0000 [IU] | Freq: Three times a day (TID) | SUBCUTANEOUS | Status: DC
  Administered 2019-08-25: 15 [IU] via SUBCUTANEOUS
  Administered 2019-08-26: 8 [IU] via SUBCUTANEOUS
  Administered 2019-08-26 (×2): 3 [IU] via SUBCUTANEOUS

## 2019-08-25 MED ORDER — ASPIRIN EC 81 MG PO TBEC
81.0000 mg | DELAYED_RELEASE_TABLET | Freq: Every day | ORAL | Status: DC
  Administered 2019-08-26: 81 mg via ORAL
  Filled 2019-08-25: qty 1

## 2019-08-25 MED ORDER — ACETAMINOPHEN 325 MG PO TABS: 650.0000 mg | ORAL_TABLET | ORAL | Status: DC | PRN

## 2019-08-25 MED ORDER — SODIUM CHLORIDE 0.9% FLUSH
3.0000 mL | Freq: Two times a day (BID) | INTRAVENOUS | Status: DC
  Administered 2019-08-25: 3 mL via INTRAVENOUS

## 2019-08-25 MED ORDER — ONDANSETRON HCL 4 MG/2ML IJ SOLN
INTRAMUSCULAR | Status: AC
  Filled 2019-08-25: qty 2

## 2019-08-25 MED ORDER — INSULIN ASPART PROT & ASPART (70-30 MIX) 100 UNIT/ML ~~LOC~~ SUSP
20.0000 [IU] | Freq: Every day | SUBCUTANEOUS | Status: DC
  Administered 2019-08-25 – 2019-08-26 (×2): 20 [IU] via SUBCUTANEOUS
  Filled 2019-08-25: qty 10

## 2019-08-25 MED ORDER — ASPIRIN 81 MG PO CHEW: 324.0000 mg | CHEWABLE_TABLET | ORAL | Status: AC

## 2019-08-25 MED ORDER — HYDROCHLOROTHIAZIDE 25 MG PO TABS: 25.0000 mg | ORAL_TABLET | Freq: Every day | ORAL | Status: DC

## 2019-08-25 MED ORDER — INSULIN ASPART 100 UNIT/ML ~~LOC~~ SOLN
0.0000 [IU] | Freq: Every day | SUBCUTANEOUS | Status: DC
  Administered 2019-08-25: 2 [IU] via SUBCUTANEOUS
  Administered 2019-08-26: 3 [IU] via SUBCUTANEOUS

## 2019-08-25 MED ORDER — SODIUM CHLORIDE 0.9 % WEIGHT BASED INFUSION
1.0000 mL/kg/h | INTRAVENOUS | Status: DC
  Administered 2019-08-26: 1 mL/kg/h via INTRAVENOUS

## 2019-08-25 MED ORDER — MUPIROCIN 2 % EX OINT
1.0000 "application " | TOPICAL_OINTMENT | Freq: Two times a day (BID) | CUTANEOUS | Status: DC
  Administered 2019-08-25 – 2019-08-28 (×4): 1 via NASAL
  Filled 2019-08-25 (×3): qty 22

## 2019-08-25 MED ORDER — METOPROLOL TARTRATE 5 MG/5ML IV SOLN
5.0000 mg | Freq: Once | INTRAVENOUS | Status: AC
  Administered 2019-08-25: 5 mg via INTRAVENOUS
  Filled 2019-08-25: qty 5

## 2019-08-25 NOTE — ED Notes (Signed)
ED Provider at bedside. 

## 2019-08-25 NOTE — ED Notes (Signed)
Report given to Calvin RN with [facility] °

## 2019-08-25 NOTE — Progress Notes (Signed)
Pt received to unit from [facility], Heparin running 27ml/Hour upon arrival   VSS,  pt oriented to unit. Lives with wife. Will conitne to monitor .   Albin Felling Torey Regan

## 2019-08-25 NOTE — ED Notes (Signed)
Date and time results received: 08/25/19 0855   Test: trp Critical Value: [id number] Name of Provider Notified: [email or web address]  Orders Received? Or Actions Taken?:no orders given

## 2019-08-25 NOTE — ED Notes (Signed)
Patient transported to X-ray 

## 2019-08-25 NOTE — H&P (Addendum)
Cardiology Admission History and Physical:   Patient ID: [name] [name] MRN: [id number]; DOB: [birth year]   Admission date: 08/25/2019  Primary Care Provider: Unk Pinto, MD Primary Cardiologist: No primary care provider on file. new Primary Electrophysiologist:  None   Chief Complaint:  NSTEMI  Patient Profile:   [name] [name] is a 54 y.o. male with hypertension, hyperlipidemia, asthma, EtoH abuse, diabetes, and GERD admitted with NSTEMI.  History of Present Illness:   [name] [name] reports 1-2 months of chest pressure and belching.  He initially though it was gas.  However he is also noting exertional dyspnea.  His wife urged him to be seen because the symptoms seem to be worse lately.  He presented to [facility] with worsening chest pain.  In the ED vital signs were stable.  He was started on heparin and aspirin.  He had no recurrent chest pain.  Cardiac enzymes were notable for a high-sensitivity troponin that increased from 7922 on admission to 13,000 766 prior to leaving [facility].  He currently denies chest pain but does have some nausea.  He denies LE edema, orthopnea or PND.  He reports drinking up to 6 beers a night.  He has no history of DTs or withdrawal.  His wife noted that he needed to cut back with his alcohol.  He also occasionally uses marijuana but no other illicits.  His father had his first stents placed in his 14s.  [name] [name] was diagnosed with diabetes at around age 70 and his has not been well-controlled lately.  He also notes that his blood pressure has been poorly controlled.  He struggles with compliance and often misses doses, especially with the Metformin, because of GI upset.   Past Medical History:  Diagnosis Date  . Anxiety   . ASTHMA 10/28/2006  . Cough   . Diabetes mellitus type II 2000   age 35  . GERD (gastroesophageal reflux disease)   . Hyperlipidemia   . Hypertension 1995  . Hypothyroidism 2002   thyroidectomy for Goiter  . Lumbar back pain   . NASH (nonalcoholic steatohepatitis)   . Numbness   . OSA (obstructive sleep apnea) 11/19/2010  . Sleep apnea    Pt states mild case, doesn't use cpap    Past Surgical History:  Procedure Laterality Date  . bilateral shoulder surgery    . right knee repair  1997  . THYROIDECTOMY     right  . TRIGGER FINGER RELEASE    . TRIGGER FINGER RELEASE  02/23/2011   Procedure: RELEASE TRIGGER FINGER/A-1 PULLEY;  Surgeon: Meredith Pel;  Location: [facility];  Service: Orthopedics;  Laterality: Right;  Right 4th trigger finger release     Medications Prior to Admission: Prior to Admission medications   Medication Sig Start Date End Date Taking? Authorizing Provider  aspirin 81 MG tablet Take 81 mg by mouth daily.      [provider]  blood glucose meter kit and supplies KIT Dispense based on patient and insurance preference. Use up to 3 times daily as directed. (FOR ICD-10-E11.9 and Z79.4). 06/15/17   Unk Pinto, MD  citalopram (CELEXA) 40 MG tablet Take 1 tablet Daily for Mood 02/27/19   Unk Pinto, MD  famotidine (PEPCID) 20 MG tablet Take 1 tablet (20 mg total) by mouth 2 (two) times daily. 09/12/18   Liane Comber, NP  Insulin Isophane & Regular Human (NOVOLIN 70/30 FLEXPEN) (70-30) 100 UNIT/ML PEN Take 40 units with breakfast  and 20-35 units with dinner (start with 20 units with dinner, slowly titrate up until fasting glucose is <150). 09/13/18   Liane Comber, NP  levothyroxine (SYNTHROID) 137 MCG tablet Take 1 tablet daily on an empty stomach with only water for 30 minutes & no Antacid meds, Calcium or Magnesium for 4 hours & avoid Biotin 07/13/19   Unk Pinto, MD  losartan-hydrochlorothiazide [facility]) 100-25 MG tablet Take 1 tablet Daily for BP & Fluid Retention / Ankle Swelling 07/13/19   Unk Pinto, MD  metFORMIN (GLUCOPHAGE-XR) 500 MG 24 hr tablet Take 2 tablets 2 x  /day with Meals for Diabetes 07/13/19    Unk Pinto, MD  MILK THISTLE PO Take 280 mg by mouth.    [provider]  omeprazole (PRILOSEC) 40 MG capsule Take 1 capsule Daily for Indigestion & Reflux 07/23/19   Vicie Mutters, PA-C  predniSONE (DELTASONE) 20 MG tablet 1 tab 3 x /day as Directed for Sciatica 05/31/19   Unk Pinto, MD  rosuvastatin (CRESTOR) 40 MG tablet Take 1 tablet Daily for Cholesterol 07/13/19   Unk Pinto, MD  VITAMIN D PO Take 2,000 Units by mouth in the morning and at bedtime.    [provider]     Allergies:   No Known Allergies  Social History:   Social History   Socioeconomic History  . Marital status: Married    Spouse name: Not on file  . Number of children: 2  . Years of education: Not on file  . Highest education level: Not on file  Occupational History  . 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 Smoking status: Never Smoker  . Smokeless tobacco: Never Used  Substance and Sexual Activity  . Alcohol use: Yes    Alcohol/week: 0.0 standard drinks    Comment: 20 beers per week  . Drug use: Yes    Types: Marijuana  . Sexual activity: Not on file  Other Topics Concern  . Not on file  Social History Narrative  . Not on file   Social Determinants of Health   Financial Resource Strain:   . Difficulty of Paying Living Expenses:   Food Insecurity:   . Worried About Charity fundraiser in the Last Year:   . Arboriculturist in the Last Year:   Transportation Needs:   . Film/video editor (Medical):   Marland Kitchen Lack of Transportation (Non-Medical):   Physical Activity:   . Days of Exercise per Week:   . Minutes of Exercise per Session:   Stress:   . Feeling of Stress :   Social Connections:   . Frequency of Communication with Friends and Family:   . Frequency of Social Gatherings with Friends and Family:   . Attends Religious Services:   . Active Member of Clubs or Organizations:   . Attends Archivist Meetings:   Marland Kitchen  Marital Status:   Intimate Partner Violence:   . Fear of Current or Ex-Partner:   . Emotionally Abused:   Marland Kitchen Physically Abused:   . Sexually Abused:     Family History:   The patient's family history includes Breast cancer in his mother; Heart attack in his father; Osteoarthritis in his father; Skin cancer in his father. There is no history of Colon cancer, Esophageal cancer, Rectal cancer, or Stomach cancer.    ROS:  Please see the history of present illness.  All other ROS reviewed and negative.     Physical  Exam/Data:   Vitals:   08/25/19 1030 08/25/19 1100 08/25/19 1200 08/25/19 1253  BP: 108/79 95/75 110/66 106/90  Pulse: 87 87 87 97  Resp: 18 (!) '21 18 19  '$ Temp:    98.7 F (37.1 C)  TempSrc:    Oral  SpO2: 97% 93% 97% 96%  Weight:    97.3 kg  Height:    '5\' 10"'$  (1.778 m)   No intake or output data in the 24 hours ending 08/25/19 1440 Last 3 Weights 08/25/2019 08/25/2019 05/31/2019  Weight (lbs) 214 lb 6.4 oz 221 lb 221 lb 6.4 oz  Weight (kg) 97.251 kg 100.245 kg 100.426 kg     VS:  BP 106/90 (BP Location: Left Arm)   Pulse 97   Temp 98.7 F (37.1 C) (Oral)   Resp 19   Ht '5\' 10"'$  (1.778 m)   Wt 97.3 kg   SpO2 96%   BMI 30.76 kg/m  , BMI Body mass index is 30.76 kg/m. GENERAL:  Well appearing HEENT: Pupils equal round and reactive, fundi not visualized, oral mucosa unremarkable NECK:  No jugular venous distention, waveform within normal limits, carotid upstroke brisk and symmetric, no bruits LUNGS:  Clear to auscultation bilaterally HEART:  RRR.  PMI not displaced or sustained,S1 and S2 within normal limits, no S3, no S4, no clicks, no rubs, no murmurs ABD:  Flat, positive bowel sounds normal in frequency in pitch, no bruits, no rebound, no guarding, no midline pulsatile mass, no hepatomegaly, no splenomegaly EXT:  2 plus pulses throughout, no edema, no cyanosis no clubbing SKIN:  No rashes no nodules NEURO:  Cranial nerves II through XII grossly intact, motor  grossly intact throughout PSYCH:  Cognitively intact, oriented to person place and time   EKG:  The ECG that was done 08/25/19 was personally reviewed and demonstrates sinus rhythm.  Rate 99 bpm. Inferolateral Q waves.  Relevant CV Studies: n/a  Laboratory Data:  High Sensitivity Troponin:   Recent Labs  Lab 08/25/19 0810 08/25/19 1010  TROPONINIHS 7,922* 13,766*      Chemistry Recent Labs  Lab 08/25/19 0810  NA 137  K 4.5  CL 98  CO2 22  GLUCOSE 356*  BUN 24*  CREATININE 1.01  CALCIUM 9.4  GFRNONAA >60  GFRAA >60  ANIONGAP 17*    Recent Labs  Lab 08/25/19 0810  PROT 8.4*  ALBUMIN 4.7  AST 87*  ALT 36  ALKPHOS 81  BILITOT 1.4*   Hematology Recent Labs  Lab 08/25/19 0810  WBC 14.9*  RBC 5.10  HGB 16.4  HCT 45.7  MCV 89.6  MCH 32.2  MCHC 35.9  RDW 11.9  PLT 225   BNPNo results for input(s): BNP, PROBNP in the last 168 hours.  DDimer  Recent Labs  Lab 08/25/19 0810  DDIMER 0.35     Radiology/Studies:  DG Chest 2 View  Result Date: 08/25/2019 CLINICAL DATA:  Left-sided chest pain EXAM: CHEST - 2 VIEW COMPARISON:  02/21/2011 FINDINGS: Cardiac shadow is within normal limits. The lungs are well aerated bilaterally. Mild central vascular prominence is seen with mild interstitial edema. No sizable effusion is noted. No focal infiltrate is seen. IMPRESSION: Changes of early CHF. Electronically Signed   By: Inez Catalina M.D.   On: 08/25/2019 08:52       TIMI Risk Score for Unstable Angina or Non-ST Elevation MI:   The patient's TIMI risk score is 4, which indicates a 20% risk of all cause mortality, new or recurrent  myocardial infarction or need for urgent revascularization in the next 14 days.   Assessment and Plan:   # NSTEMI: [name] [name] presents with NSTEMI.  Given his nausea and small q waves on EKG, I am concerned for RCA disease.  He is currently not having any chest pain but does have mild nausea.  Continue heparin and aspirin. Start  carvedilol 3.'25mg'$  bid.  We will give some Zofran for now.  Plan for left heart catheterization in the morning.High-sensitivity troponin rose from 7000 913,000.  Low threshold for today if this continues to have persistent symptoms.  The importance of taking dual antiplatelet therapy without missing needs to be stressed given his history with noncompliance.  Will order echo.  Risks and benefits of cardiac catheterization have been discussed with the patient.  The patient understands that risks included but are not limited to stroke (1 in 1000), death (1 in 65), kidney failure [usually temporary] (1 in 500), bleeding (1 in 200), allergic reaction [possibly serious] (1 in 200). The patient understands and agrees to proceed.    # Hyperlipidemia:  Check fasting lipids in the morning.  Continue rosuvastatin  #DM: Longstanding history of DM.  Hemoglobin A1c pending.  He notes that his blood sugars have been well-controlled at home.  Holding home Metformin in preparation for cath.  Continue home 70/30 insulin at 40 units twice daily plus sliding scale.  # EtOH abuse:  Discussed the importance of limiting to 2/setting and 14/week.  No h/o DT.  Last drink was last weekend.  Lipase normal this AM.  # Hypertension:  BP currently low.  Hold home losartan/HCTZ for now.  Start carvedilol 3.'125mg'$  bid.   Severity of Illness: The appropriate patient status for this patient is OBSERVATION. Observation status is judged to be reasonable and necessary in order to provide the required intensity of service to ensure the patient's safety. The patient's presenting symptoms, physical exam findings, and initial radiographic and laboratory data in the context of their medical condition is felt to place them at decreased risk for further clinical deterioration. Furthermore, it is anticipated that the patient will be medically stable for discharge from the hospital within 2 midnights of admission. The following factors support  the patient status of observation.   " The patient's presenting symptoms include chest pain, shortness of breath. " The physical exam findings include n/a. " The initial radiographic and laboratory data are +troponin,  q waves.     For questions or updates, please contact [facility] Please consult [email or web address] for contact info under        Signed, Skeet Latch, MD  08/25/2019 2:40 PM

## 2019-08-25 NOTE — Progress Notes (Signed)
CRITICAL VALUE ALERT  Critical Value:  Troponin 14,441  Date & Time Notified:  08/25/19 1510  Provider Notified: Tommye Standard, [place]  Orders Received/Actions taken: no new orders

## 2019-08-25 NOTE — ED Notes (Signed)
Attempted to give report to receiving nurse at [facility] but unavailable

## 2019-08-25 NOTE — Progress Notes (Signed)
ANTICOAGULATION CONSULT NOTE - Initial Consult  Pharmacy Consult for Heparin  Indication: chest pain/ACS  No Known Allergies  Patient Measurements: Height: 5\' 10"  (177.8 cm) Weight: 100.2 kg (221 lb) IBW/kg (Calculated) : 73 Heparin Dosing Weight: 93.9 kg  Vital Signs: Temp: 98.8 F (37.1 C) (05/23 0810) Temp Source: Oral (05/23 0810) BP: 144/93 (05/23 0900) Pulse Rate: 103 (05/23 0900)  Labs: Recent Labs    08/25/19 0810  HGB 16.4  HCT 45.7  PLT 225  CREATININE 1.01  TROPONINIHS 7,922*    Estimated Creatinine Clearance: 96.9 mL/min (by C-G formula based on SCr of 1.01 mg/dL).   Medical History: Past Medical History:  Diagnosis Date  . Anxiety   . ASTHMA 10/28/2006  . Cough   . Diabetes mellitus type II 2000   age 54  . GERD (gastroesophageal reflux disease)   . Hyperlipidemia   . Hypertension 1995  . Hypothyroidism 2002   thyroidectomy for Goiter  . Lumbar back pain   . NASH (nonalcoholic steatohepatitis)   . Numbness   . OSA (obstructive sleep apnea) 11/19/2010  . Sleep apnea    Pt states mild case, doesn't use cpap    Assessment: 54 YOM who presented to [facility] on 5/23 with CP and concern for ACS/NSTEMI. Pharmacy consulted to start Heparin for anticoagulation.   The patient has already received a heparin 4000 unit bolus x 1 earlier today at 0915 and ASA 324 x 1. CBC wnl, no AC noted PTA outside of a baby aspirin.  Goal of Therapy:  Heparin level 0.3-0.7 units/ml Monitor platelets by anticoagulation protocol: Yes   Plan:  - Heparin 4000 unit bolus (already given) - Start Heparin at a rate of 1300 units/hr - Daily HL/CBC - Will continue to monitor for any signs/symptoms of bleeding and will follow up with heparin level in 6 hours   Thank you for allowing pharmacy to be a part of this patient's care.  Alycia Rossetti, PharmD, BCPS Clinical Pharmacist Clinical phone for 08/25/2019: [phone] 08/25/2019 9:27 AM   **Pharmacist phone directory can  now be found on [email or web address] ([email or web address]).  Listed under [place].

## 2019-08-25 NOTE — ED Notes (Signed)
Date and time results received: 08/25/19 1120 Test: trp Critical Value: 13,766  Name of Provider Notified:[place]  Orders Received? Or Actions Taken?: no orders given

## 2019-08-25 NOTE — ED Provider Notes (Addendum)
[facility] EMERGENCY DEPARTMENT Provider Note   CSN: [id number] Arrival date & time: 08/25/19  0801     History Chief Complaint  Patient presents with  . Chest Pain   [name] [name] is a 54 y.o. male.  Presenting ER with chest pain.  Patient reports over the past month he has had intermittent episodes of chest pain.  States these occur almost exclusively after he has been exerting himself significantly.  Will to follow-up severe chest heaviness, achiness, left-sided and often times will feel somewhat nauseous and clammy, sweaty.  States last night he had a particularly bad episode that also occurred after exertion.  Improved with rest.  This morning when he woke up he felt mildly nauseous but has not had any ongoing chest pain.  No pain currently.  Denies cardiac history, reports history of diabetes, hypertension.  HPI     Past Medical History:  Diagnosis Date  . Anxiety   . ASTHMA 10/28/2006  . Cough   . Diabetes mellitus type II 2000   age 12  . GERD (gastroesophageal reflux disease)   . Hyperlipidemia   . Hypertension 1995  . Hypothyroidism 2002   thyroidectomy for Goiter  . Lumbar back pain   . NASH (nonalcoholic steatohepatitis)   . Numbness   . OSA (obstructive sleep apnea) 11/19/2010  . Sleep apnea    Pt states mild case, doesn't use cpap    Patient Active Problem List   Diagnosis Date Noted  . NSTEMI (non-ST elevated myocardial infarction) ([place]) 08/25/2019  . Ectatic abdominal aorta ([place]) 11/01/2018  . NASH (nonalcoholic steatohepatitis)   . Elevated LFTs 09/13/2018  . Alcohol consumption heavy 09/12/2018  . GERD (gastroesophageal reflux disease) 08/13/2018  . Hypothyroidism 10/11/2017  . Essential hypertension 10/11/2017  . Vitamin D deficiency 10/11/2017  . Testosterone deficiency 10/11/2017  . Obesity (BMI 30.0-34.9) 11/10/2014  . Hyperlipidemia associated with type 2 diabetes mellitus ([place]) 11/10/2014  . Diabetes mellitus type 2,  insulin dependent ([place]) 08/14/2012  . Carpal tunnel syndrome of right wrist 09/08/2011  . Depression 09/08/2011  . OSA (obstructive sleep apnea) 11/19/2010  . ASTHMA 10/28/2006    Past Surgical History:  Procedure Laterality Date  . bilateral shoulder surgery    . right knee repair  1997  . THYROIDECTOMY     right  . TRIGGER FINGER RELEASE    . TRIGGER FINGER RELEASE  02/23/2011   Procedure: RELEASE TRIGGER FINGER/A-1 PULLEY;  Surgeon: Meredith Pel;  Location: [facility];  Service: Orthopedics;  Laterality: Right;  Right 4th trigger finger release       Family History  Problem Relation Age of Onset  . Osteoarthritis Father   . Heart attack Father   . Skin cancer Father   . Breast cancer Mother   . Colon cancer Neg Hx   . Esophageal cancer Neg Hx   . Rectal cancer Neg Hx   . Stomach cancer Neg Hx     Social History   Tobacco Use  . Smoking status: Never Smoker  . Smokeless tobacco: Never Used  Substance Use Topics  . Alcohol use: Yes    Alcohol/week: 0.0 standard drinks    Comment: 20 beers per week  . Drug use: Yes    Types: Marijuana    Home Medications Prior to Admission medications   Medication Sig Start Date End Date Taking? Authorizing Provider  aspirin 81 MG tablet Take 81 mg by mouth daily.      [provider]  blood glucose meter kit and supplies KIT Dispense based on patient and insurance preference. Use up to 3 times daily as directed. (FOR ICD-10-E11.9 and Z79.4). 06/15/17   Unk Pinto, MD  citalopram (CELEXA) 40 MG tablet Take 1 tablet Daily for Mood 02/27/19   Unk Pinto, MD  famotidine (PEPCID) 20 MG tablet Take 1 tablet (20 mg total) by mouth 2 (two) times daily. 09/12/18   Liane Comber, NP  Insulin Isophane & Regular Human (NOVOLIN 70/30 FLEXPEN) (70-30) 100 UNIT/ML PEN Take 40 units with breakfast and 20-35 units with dinner (start with 20 units with dinner, slowly titrate up until fasting glucose is <150). 09/13/18    Liane Comber, NP  levothyroxine (SYNTHROID) 137 MCG tablet Take 1 tablet daily on an empty stomach with only water for 30 minutes & no Antacid meds, Calcium or Magnesium for 4 hours & avoid Biotin 07/13/19   Unk Pinto, MD  losartan-hydrochlorothiazide [facility]) 100-25 MG tablet Take 1 tablet Daily for BP & Fluid Retention / Ankle Swelling 07/13/19   Unk Pinto, MD  metFORMIN (GLUCOPHAGE-XR) 500 MG 24 hr tablet Take 2 tablets 2 x  /day with Meals for Diabetes 07/13/19   Unk Pinto, MD  MILK THISTLE PO Take 280 mg by mouth.    [provider]  omeprazole (PRILOSEC) 40 MG capsule Take 1 capsule Daily for Indigestion & Reflux 07/23/19   Vicie Mutters, PA-C  predniSONE (DELTASONE) 20 MG tablet 1 tab 3 x /day as Directed for Sciatica 05/31/19   Unk Pinto, MD  rosuvastatin (CRESTOR) 40 MG tablet Take 1 tablet Daily for Cholesterol 07/13/19   Unk Pinto, MD  VITAMIN D PO Take 2,000 Units by mouth in the morning and at bedtime.    [provider]    Allergies    Patient has no known allergies.  Review of Systems   Review of Systems  Constitutional: Negative for chills and fever.  HENT: Negative for ear pain and sore throat.   Eyes: Negative for pain and visual disturbance.  Respiratory: Negative for cough and shortness of breath.   Cardiovascular: Positive for chest pain. Negative for palpitations.  Gastrointestinal: Positive for nausea. Negative for abdominal pain and vomiting.  Genitourinary: Negative for dysuria and hematuria.  Musculoskeletal: Negative for arthralgias and back pain.  Skin: Negative for color change and rash.  Neurological: Negative for seizures and syncope.  All other systems reviewed and are negative.   Physical Exam Updated Vital Signs BP 112/89   Pulse 85   Temp 98.8 F (37.1 C) (Oral)   Resp (!) 26   Ht '5\' 10"'$  (1.778 m)   Wt 100.2 kg   SpO2 95%   BMI 31.71 kg/m   Physical Exam Vitals and nursing note reviewed.    Constitutional:      Appearance: He is well-developed.  HENT:     Head: Normocephalic and atraumatic.  Eyes:     Conjunctiva/sclera: Conjunctivae normal.  Cardiovascular:     Rate and Rhythm: Normal rate and regular rhythm.     Heart sounds: No murmur.  Pulmonary:     Effort: Pulmonary effort is normal. No respiratory distress.     Breath sounds: Normal breath sounds.  Chest:     Chest wall: No tenderness or crepitus.  Abdominal:     Palpations: Abdomen is soft.     Tenderness: There is no abdominal tenderness.  Musculoskeletal:     Cervical back: Neck supple.     Right lower leg: No edema.  Left lower leg: No edema.  Skin:    General: Skin is warm and dry.     Capillary Refill: Capillary refill takes less than 2 seconds.  Neurological:     General: No focal deficit present.     Mental Status: He is alert and oriented to person, place, and time.     ED Results / Procedures / Treatments   Labs (all labs ordered are listed, but only abnormal results are displayed) Labs Reviewed  CBC - Abnormal; Notable for the following components:      Result Value   WBC 14.9 (*)    All other components within normal limits  COMPREHENSIVE METABOLIC PANEL - Abnormal; Notable for the following components:   Glucose, Bld 356 (*)    BUN 24 (*)    Total Protein 8.4 (*)    AST 87 (*)    Total Bilirubin 1.4 (*)    Anion gap 17 (*)    All other components within normal limits  TROPONIN I (HIGH SENSITIVITY) - Abnormal; Notable for the following components:   Troponin I (High Sensitivity) 7,922 (*)    All other components within normal limits  SARS CORONAVIRUS 2 BY RT PCR (HOSPITAL ORDER, [place])  LIPASE, BLOOD  D-DIMER, QUANTITATIVE (NOT AT [facility])  HEPARIN LEVEL (UNFRACTIONATED)  TROPONIN I (HIGH SENSITIVITY)    EKG EKG Interpretation  Date/Time:  Sunday Aug 25 2019 08:10:10 EDT Ventricular Rate:  99 PR Interval:    QRS Duration: 100 QT  Interval:  397 QTC Calculation: 510 R Axis:   67 Text Interpretation: Sinus rhythm Nonspecific T abnormalities, lateral leads Prolonged QT interval Confirmed by Madalyn Rob [phone]) on 08/25/2019 8:29:41 AM   Radiology DG Chest 2 View  Result Date: 08/25/2019 CLINICAL DATA:  Left-sided chest pain EXAM: CHEST - 2 VIEW COMPARISON:  02/21/2011 FINDINGS: Cardiac shadow is within normal limits. The lungs are well aerated bilaterally. Mild central vascular prominence is seen with mild interstitial edema. No sizable effusion is noted. No focal infiltrate is seen. IMPRESSION: Changes of early CHF. Electronically Signed   By: Inez Catalina M.D.   On: 08/25/2019 08:52    Procedures .Critical Care Performed by: Lucrezia Starch, MD Authorized by: Lucrezia Starch, MD   Critical care provider statement:    Critical care time (minutes):  45   Critical care was necessary to treat or prevent imminent or life-threatening deterioration of the following conditions:  Cardiac failure   Critical care was time spent personally by me on the following activities:  Discussions with consultants, evaluation of patient's response to treatment, examination of patient, ordering and performing treatments and interventions, ordering and review of laboratory studies, ordering and review of radiographic studies, pulse oximetry, re-evaluation of patient's condition, obtaining history from patient or surrogate and review of old charts   (including critical care time)  Medications Ordered in ED Medications  heparin ADULT infusion 100 units/mL (25000 units/278m sodium chloride 0.45%) (1,300 Units/hr Intravenous New Bag/Given 08/25/19 0944)  aspirin chewable tablet 324 mg (324 mg Oral Given 08/25/19 0919)  heparin injection 4,000 Units (4,000 Units Intravenous Given 08/25/19 0915)  metoprolol tartrate (LOPRESSOR) injection 5 mg (5 mg Intravenous Given 08/25/19 0918)  ondansetron (ZOFRAN) injection 4 mg (4 mg Intravenous  Given 08/25/19 0914)    ED Course  I have reviewed the triage vital signs and the nursing notes.  Pertinent labs & imaging results that were available during my care of the patient were reviewed  by me and considered in my medical decision making (see chart for details).    MDM Rules/Calculators/A&P                      54 year old male presented to ER with chest pain.  Very concerning episodes of chest pain occurring over the past month.  No ongoing pain in ER today.  EKG negative for STEMI here.  Troponin however was profoundly elevated.  Dimer negative.  Current presentation consistent with ACS, NSTEMI.  Consulted cardiology, likely cath tomorrow, okay to start diet for now.  Started heparin, aspirin.  Had some intermittent episodes of sinus tachycardia, resolved after receiving metoprolol. Vitals otherwise stable. Will transfer to [facility] via [facility], admitted to Dr. Skeet Latch service with cardiology.  ADDENDUM - repeat troponin up to ~14000. Pt on frequent reassessments remains chest pain free, vital signs have remained stable. Dr. Oval Linsey updated.   Final Clinical Impression(s) / ED Diagnoses Final diagnoses:  ACS (acute coronary syndrome) (HCC)  NSTEMI (non-ST elevated myocardial infarction) ([place])  Elevated troponin    Rx / DC Orders ED Discharge Orders    None       Lucrezia Starch, MD 08/25/19 1002    Lucrezia Starch, MD 08/25/19 1124

## 2019-08-25 NOTE — ED Triage Notes (Signed)
L side chest discomfort with nausea that started last night after eating japanese food. He broke out in a sweat. It has resolved. He states this has happened frequently over the past month.

## 2019-08-25 NOTE — Progress Notes (Signed)
ANTICOAGULATION CONSULT NOTE - Initial Consult  Pharmacy Consult for Heparin  Indication: chest pain/ACS  No Known Allergies  Patient Measurements: Height: 5\' 10"  (177.8 cm) Weight: 97.3 kg (214 lb 6.4 oz) IBW/kg (Calculated) : 73 Heparin Dosing Weight: 93.9 kg  Vital Signs: Temp: 99.3 F (37.4 C) (05/23 1623) Temp Source: Oral (05/23 1623) BP: 105/70 (05/23 1623) Pulse Rate: 89 (05/23 1623)  Labs: Recent Labs    08/25/19 0810 08/25/19 0810 08/25/19 1010 08/25/19 1408 08/25/19 1537  HGB 16.4  --   --   --   --   HCT 45.7  --   --   --   --   PLT 225  --   --   --   --   HEPARINUNFRC  --   --   --   --  0.25*  CREATININE 1.01  --   --   --   --   TROPONINIHS 7,922*   < > 13,766* 14,441* 12,219*   < > = values in this interval not displayed.    Estimated Creatinine Clearance: 95.5 mL/min (by C-G formula based on SCr of 1.01 mg/dL).  Assessment: 6 YOM who presented to [facility] with CP and concern for ACS/NSTEMI.  Initial hep lvl 0.25  Goal of Therapy:  Heparin level 0.3-0.7 units/ml Monitor platelets by anticoagulation protocol: Yes   Plan:  Bolus heparin 1000 units x 1 Increase hep gtt to 1400 units/hr Next lvl 0000  Barth Kirks, PharmD, BCPS, BCCCP Clinical Pharmacist [phone]  Please check AMION for all [place] numbers  08/25/2019 7:52 PM

## 2019-08-26 ENCOUNTER — Other Ambulatory Visit: Payer: Self-pay | Admitting: *Deleted

## 2019-08-26 ENCOUNTER — Inpatient Hospital Stay (HOSPITAL_COMMUNITY)
Admission: EM | Disposition: A | Discharge status: Home / Self Care | Payer: Self-pay | Source: Home / Self Care | Attending: Surgery

## 2019-08-26 ENCOUNTER — Inpatient Hospital Stay (HOSPITAL_COMMUNITY): Payer: Managed Care, Other (non HMO)

## 2019-08-26 DIAGNOSIS — I5041 Acute combined systolic (congestive) and diastolic (congestive) heart failure: Secondary | ICD-10-CM

## 2019-08-26 DIAGNOSIS — I249 Acute ischemic heart disease, unspecified: Secondary | ICD-10-CM

## 2019-08-26 DIAGNOSIS — Z01810 Encounter for preprocedural cardiovascular examination: Secondary | ICD-10-CM

## 2019-08-26 DIAGNOSIS — I2510 Atherosclerotic heart disease of native coronary artery without angina pectoris: Secondary | ICD-10-CM

## 2019-08-26 DIAGNOSIS — I25110 Atherosclerotic heart disease of native coronary artery with unstable angina pectoris: Secondary | ICD-10-CM

## 2019-08-26 DIAGNOSIS — I340 Nonrheumatic mitral (valve) insufficiency: Secondary | ICD-10-CM

## 2019-08-26 HISTORY — PX: LEFT HEART CATH AND CORONARY ANGIOGRAPHY: CATH118249

## 2019-08-26 LAB — BLOOD GAS, ARTERIAL
Acid-Base Excess: 0.3 mmol/L (ref 0.0–2.0)
Bicarbonate: 24 mmol/L (ref 20.0–28.0)
Drawn by: 519031
FIO2: 21
O2 Saturation: 95.3 %
Patient temperature: 36.9
pCO2 arterial: 36.3 mmHg (ref 32.0–48.0)
pH, Arterial: 7.435 (ref 7.350–7.450)
pO2, Arterial: 70.5 mmHg — ABNORMAL LOW (ref 83.0–108.0)

## 2019-08-26 LAB — URINALYSIS, ROUTINE W REFLEX MICROSCOPIC
Bilirubin Urine: NEGATIVE
Glucose, UA: >=500 mg/dL — AB
Hgb urine dipstick: NEGATIVE
Ketones, ur: NEGATIVE mg/dL
Leukocytes,Ua: NEGATIVE
Nitrite: NEGATIVE
Protein, ur: NEGATIVE mg/dL
Specific Gravity, Urine: >1.046 — ABNORMAL HIGH (ref 1.005–1.030)
pH: 5 (ref 5.0–8.0)

## 2019-08-26 LAB — CBC
HCT: 39.9 % (ref 39.0–52.0)
Hemoglobin: 13.8 g/dL (ref 13.0–17.0)
MCH: 31.9 pg (ref 26.0–34.0)
MCHC: 34.6 g/dL (ref 30.0–36.0)
MCV: 92.1 fL (ref 80.0–100.0)
Platelets: 157 10*3/uL (ref 150–400)
RBC: 4.33 MIL/uL (ref 4.22–5.81)
RDW: 11.9 % (ref 11.5–15.5)
WBC: 7.8 10*3/uL (ref 4.0–10.5)
nRBC: 0 % (ref 0.0–0.2)

## 2019-08-26 LAB — HEPARIN LEVEL (UNFRACTIONATED)
Heparin Unfractionated: 0.36 IU/mL (ref 0.30–0.70)
Heparin Unfractionated: 0.49 IU/mL (ref 0.30–0.70)
Heparin Unfractionated: 0.34 IU/mL (ref 0.30–0.70)

## 2019-08-26 LAB — BASIC METABOLIC PANEL
Anion gap: 11 (ref 5–15)
BUN: 19 mg/dL (ref 6–20)
CO2: 23 mmol/L (ref 22–32)
Calcium: 8.8 mg/dL — ABNORMAL LOW (ref 8.9–10.3)
Chloride: 101 mmol/L (ref 98–111)
Creatinine, Ser: 0.88 mg/dL (ref 0.61–1.24)
GFR calc Af Amer: >60 mL/min (ref >60)
GFR calc non Af Amer: >60 mL/min (ref >60)
Glucose, Bld: 205 mg/dL — ABNORMAL HIGH (ref 70–99)
Potassium: 3.5 mmol/L (ref 3.5–5.1)
Sodium: 135 mmol/L (ref 135–145)

## 2019-08-26 LAB — PULMONARY FUNCTION TEST
FEF 25-75 Pre: 3.36 L/sec
FEF2575-%Pred-Pre: 105 %
FEV1-%Pred-Pre: 80 %
FEV1-Pre: 3.02 L
FEV1FVC-%Pred-Pre: 104 %
FEV6-%Pred-Pre: 76 %
FEV6-Pre: 3.59 L
FEV6FVC-%Pred-Pre: 104 %
FVC-%Pred-Pre: 77 %
FVC-Pre: 3.79 L
Pre FEV1/FVC ratio: 80 %
Pre FEV6/FVC Ratio: 100 %

## 2019-08-26 LAB — PROTIME-INR
INR: 1 (ref 0.8–1.2)
Prothrombin Time: 12.6 seconds (ref 11.4–15.2)

## 2019-08-26 LAB — GLUCOSE, CAPILLARY
Glucose-Capillary: 179 mg/dL — ABNORMAL HIGH (ref 70–99)
Glucose-Capillary: 186 mg/dL — ABNORMAL HIGH (ref 70–99)
Glucose-Capillary: 277 mg/dL — ABNORMAL HIGH (ref 70–99)
Glucose-Capillary: 254 mg/dL — ABNORMAL HIGH (ref 70–99)

## 2019-08-26 LAB — LIPID PANEL
Cholesterol: 122 mg/dL (ref 0–200)
HDL: 41 mg/dL (ref >40)
LDL Cholesterol: 58 mg/dL (ref 0–99)
Total CHOL/HDL Ratio: 3 RATIO
Triglycerides: 116 mg/dL (ref <150)
VLDL: 23 mg/dL (ref 0–40)

## 2019-08-26 LAB — TYPE AND SCREEN
ABO/RH(D): O POS
Antibody Screen: NEGATIVE

## 2019-08-26 LAB — APTT: aPTT: 28 seconds (ref 24–36)

## 2019-08-26 LAB — ECHOCARDIOGRAM COMPLETE
Height: 70 in
Weight: 3430.4 oz

## 2019-08-26 LAB — ABO/RH: ABO/RH(D): O POS

## 2019-08-26 SURGERY — LEFT HEART CATH AND CORONARY ANGIOGRAPHY
Anesthesia: LOCAL

## 2019-08-26 MED ORDER — MIDAZOLAM HCL 2 MG/2ML IJ SOLN
INTRAMUSCULAR | Status: DC | PRN
  Administered 2019-08-26: 1 mg via INTRAVENOUS

## 2019-08-26 MED ORDER — LABETALOL HCL 5 MG/ML IV SOLN: 10.0000 mg | INTRAVENOUS | Status: AC | PRN

## 2019-08-26 MED ORDER — CHLORHEXIDINE GLUCONATE CLOTH 2 % EX PADS
6.0000 | MEDICATED_PAD | Freq: Once | CUTANEOUS | Status: AC
  Administered 2019-08-26: 6 via TOPICAL

## 2019-08-26 MED ORDER — BISACODYL 5 MG PO TBEC
5.0000 mg | DELAYED_RELEASE_TABLET | Freq: Once | ORAL | Status: DC
  Filled 2019-08-26: qty 1

## 2019-08-26 MED ORDER — SODIUM CHLORIDE 0.9 % IV SOLN: INTRAVENOUS | Status: AC

## 2019-08-26 MED ORDER — CHLORHEXIDINE GLUCONATE CLOTH 2 % EX PADS
6.0000 | MEDICATED_PAD | Freq: Once | CUTANEOUS | Status: AC
  Administered 2019-08-27: 6 via TOPICAL

## 2019-08-26 MED ORDER — INSULIN REGULAR(HUMAN) IN NACL 100-0.9 UT/100ML-% IV SOLN
INTRAVENOUS | Status: AC
  Administered 2019-08-27: 11 [IU]/h via INTRAVENOUS
  Filled 2019-08-26: qty 100

## 2019-08-26 MED ORDER — CHLORHEXIDINE GLUCONATE 0.12 % MT SOLN
15.0000 mL | Freq: Once | OROMUCOSAL | Status: AC
  Administered 2019-08-27: 15 mL via OROMUCOSAL
  Filled 2019-08-26: qty 15

## 2019-08-26 MED ORDER — HEPARIN SODIUM (PORCINE) 1000 UNIT/ML IJ SOLN
INTRAMUSCULAR | Status: DC | PRN
  Administered 2019-08-26: 5000 [IU] via INTRAVENOUS

## 2019-08-26 MED ORDER — SODIUM CHLORIDE 0.9 % IV SOLN: 250.0000 mL | INTRAVENOUS | Status: DC | PRN

## 2019-08-26 MED ORDER — METOPROLOL TARTRATE 12.5 MG HALF TABLET
12.5000 mg | ORAL_TABLET | Freq: Once | ORAL | Status: AC
  Administered 2019-08-27: 12.5 mg via ORAL
  Filled 2019-08-26: qty 1

## 2019-08-26 MED ORDER — PHENYLEPHRINE HCL-NACL 20-0.9 MG/250ML-% IV SOLN
30.0000 ug/min | INTRAVENOUS | Status: AC
  Administered 2019-08-27: 20 ug/min via INTRAVENOUS
  Filled 2019-08-26: qty 250

## 2019-08-26 MED ORDER — HEPARIN (PORCINE) IN NACL 1000-0.9 UT/500ML-% IV SOLN
INTRAVENOUS | Status: DC | PRN
  Administered 2019-08-26 (×2): 500 mL

## 2019-08-26 MED ORDER — HEPARIN (PORCINE) IN NACL 1000-0.9 UT/500ML-% IV SOLN
INTRAVENOUS | Status: AC
  Filled 2019-08-26: qty 1000

## 2019-08-26 MED ORDER — POTASSIUM CHLORIDE CRYS ER 20 MEQ PO TBCR: 20.0000 meq | EXTENDED_RELEASE_TABLET | Freq: Two times a day (BID) | ORAL | Status: DC

## 2019-08-26 MED ORDER — TRANEXAMIC ACID (OHS) PUMP PRIME SOLUTION
2.0000 mg/kg | INTRAVENOUS | Status: DC
  Filled 2019-08-26: qty 1.95

## 2019-08-26 MED ORDER — SODIUM CHLORIDE 0.9 % IV SOLN
750.0000 mg | INTRAVENOUS | Status: AC
  Administered 2019-08-27: 750 mg via INTRAVENOUS
  Filled 2019-08-26: qty 750

## 2019-08-26 MED ORDER — VERAPAMIL HCL 2.5 MG/ML IV SOLN
INTRAVENOUS | Status: AC
  Filled 2019-08-26: qty 2

## 2019-08-26 MED ORDER — SODIUM CHLORIDE 0.9 % IV SOLN
1.5000 g | INTRAVENOUS | Status: AC
  Administered 2019-08-27: 1.5 g via INTRAVENOUS
  Filled 2019-08-26: qty 1.5

## 2019-08-26 MED ORDER — SODIUM CHLORIDE 0.9% FLUSH
3.0000 mL | Freq: Two times a day (BID) | INTRAVENOUS | Status: DC
  Administered 2019-08-26: 3 mL via INTRAVENOUS

## 2019-08-26 MED ORDER — FENTANYL CITRATE (PF) 100 MCG/2ML IJ SOLN
INTRAMUSCULAR | Status: AC
  Filled 2019-08-26: qty 2

## 2019-08-26 MED ORDER — NOREPINEPHRINE 4 MG/250ML-% IV SOLN
0.0000 ug/min | INTRAVENOUS | Status: DC
  Filled 2019-08-26: qty 250

## 2019-08-26 MED ORDER — MIDAZOLAM HCL 2 MG/2ML IJ SOLN
INTRAMUSCULAR | Status: AC
  Filled 2019-08-26: qty 2

## 2019-08-26 MED ORDER — MAGNESIUM SULFATE 50 % IJ SOLN
40.0000 meq | INTRAMUSCULAR | Status: DC
  Filled 2019-08-26: qty 9.85

## 2019-08-26 MED ORDER — VANCOMYCIN HCL 1500 MG/300ML IV SOLN
1500.0000 mg | INTRAVENOUS | Status: AC
  Administered 2019-08-27: 1500 mg via INTRAVENOUS
  Filled 2019-08-26: qty 300

## 2019-08-26 MED ORDER — HEPARIN SODIUM (PORCINE) 1000 UNIT/ML IJ SOLN
INTRAMUSCULAR | Status: AC
  Filled 2019-08-26: qty 1

## 2019-08-26 MED ORDER — HEPARIN (PORCINE) 25000 UT/250ML-% IV SOLN
1400.0000 [IU]/h | INTRAVENOUS | Status: DC
  Administered 2019-08-26: 1400 [IU]/h via INTRAVENOUS

## 2019-08-26 MED ORDER — SODIUM CHLORIDE 0.9 % IV SOLN
INTRAVENOUS | Status: DC
  Filled 2019-08-26: qty 30

## 2019-08-26 MED ORDER — DIAZEPAM 5 MG PO TABS
5.0000 mg | ORAL_TABLET | Freq: Once | ORAL | Status: AC
  Administered 2019-08-27: 5 mg via ORAL
  Filled 2019-08-26: qty 1

## 2019-08-26 MED ORDER — FENTANYL CITRATE (PF) 100 MCG/2ML IJ SOLN
INTRAMUSCULAR | Status: DC | PRN
  Administered 2019-08-26: 50 ug via INTRAVENOUS

## 2019-08-26 MED ORDER — MILRINONE LACTATE IN DEXTROSE 20-5 MG/100ML-% IV SOLN
0.3000 ug/kg/min | INTRAVENOUS | Status: DC
  Filled 2019-08-26: qty 100

## 2019-08-26 MED ORDER — NITROGLYCERIN IN D5W 200-5 MCG/ML-% IV SOLN
2.0000 ug/min | INTRAVENOUS | Status: DC
  Filled 2019-08-26: qty 250

## 2019-08-26 MED ORDER — EPINEPHRINE HCL 5 MG/250ML IV SOLN IN NS
0.0000 ug/min | INTRAVENOUS | Status: DC
  Filled 2019-08-26: qty 250

## 2019-08-26 MED ORDER — DEXMEDETOMIDINE HCL IN NACL 400 MCG/100ML IV SOLN
0.1000 ug/kg/h | INTRAVENOUS | Status: AC
  Administered 2019-08-27 (×2): .7 ug/kg/h via INTRAVENOUS
  Filled 2019-08-26: qty 100

## 2019-08-26 MED ORDER — POTASSIUM CHLORIDE 2 MEQ/ML IV SOLN
80.0000 meq | INTRAVENOUS | Status: DC
  Filled 2019-08-26: qty 40

## 2019-08-26 MED ORDER — LIDOCAINE HCL (PF) 1 % IJ SOLN
INTRAMUSCULAR | Status: AC
  Filled 2019-08-26: qty 30

## 2019-08-26 MED ORDER — HYDRALAZINE HCL 20 MG/ML IJ SOLN: 10.0000 mg | INTRAMUSCULAR | Status: AC | PRN

## 2019-08-26 MED ORDER — SODIUM CHLORIDE 0.9% FLUSH: 3.0000 mL | INTRAVENOUS | Status: DC | PRN

## 2019-08-26 MED ORDER — TRANEXAMIC ACID (OHS) BOLUS VIA INFUSION
15.0000 mg/kg | INTRAVENOUS | Status: AC
  Administered 2019-08-27: 1459.5 mg via INTRAVENOUS
  Filled 2019-08-26: qty 1460

## 2019-08-26 MED ORDER — PLASMA-LYTE 148 IV SOLN
INTRAVENOUS | Status: DC
  Filled 2019-08-26: qty 2.5

## 2019-08-26 MED ORDER — POTASSIUM CHLORIDE CRYS ER 20 MEQ PO TBCR
20.0000 meq | EXTENDED_RELEASE_TABLET | Freq: Once | ORAL | Status: AC
  Administered 2019-08-26: 20 meq via ORAL
  Filled 2019-08-26: qty 1

## 2019-08-26 MED ORDER — LIDOCAINE HCL (PF) 1 % IJ SOLN
INTRAMUSCULAR | Status: DC | PRN
  Administered 2019-08-26: 2 mL

## 2019-08-26 MED ORDER — TEMAZEPAM 15 MG PO CAPS
15.0000 mg | ORAL_CAPSULE | Freq: Once | ORAL | Status: AC | PRN
  Administered 2019-08-26: 15 mg via ORAL
  Filled 2019-08-26: qty 1

## 2019-08-26 MED ORDER — VERAPAMIL HCL 2.5 MG/ML IV SOLN
INTRAVENOUS | Status: DC | PRN
  Administered 2019-08-26: 10 mL via INTRA_ARTERIAL

## 2019-08-26 MED ORDER — IOHEXOL 350 MG/ML SOLN
INTRAVENOUS | Status: DC | PRN
  Administered 2019-08-26: 45 mL

## 2019-08-26 MED ORDER — TRANEXAMIC ACID 1000 MG/10ML IV SOLN
1.5000 mg/kg/h | INTRAVENOUS | Status: AC
  Administered 2019-08-27: 1.5 mg/kg/h via INTRAVENOUS
  Filled 2019-08-26: qty 25

## 2019-08-26 SURGICAL SUPPLY — 9 items

## 2019-08-26 NOTE — Interval H&P Note (Signed)
History and Physical Interval Note:  08/26/2019 9:56 AM  [name] [name]  has presented today for surgery, with the diagnosis of NSTEMI.  The various methods of treatment have been discussed with the patient and family. After consideration of risks, benefits and other options for treatment, the patient has consented to  Procedure(s): LEFT HEART CATH AND CORONARY ANGIOGRAPHY (N/A) as a surgical intervention.  The patient's history has been reviewed, patient examined, no change in status, stable for surgery.  I have reviewed the patient's chart and labs.  Questions were answered to the patient's satisfaction.    Cath Lab Visit (complete for each Cath Lab visit)  Clinical Evaluation Leading to the Procedure:   ACS: Yes.    Non-ACS:  N/A  Hadyn Azer

## 2019-08-26 NOTE — Progress Notes (Signed)
ANTICOAGULATION CONSULT NOTE  Pharmacy Consult for Heparin  Indication: chest pain/ACS  No Known Allergies  Patient Measurements: Height: 5\' 10"  (177.8 cm) Weight: 97.3 kg (214 lb 6.4 oz) IBW/kg (Calculated) : 73 Heparin Dosing Weight: 93.9 kg  Vital Signs: Temp: 98.7 F (37.1 C) (05/24 0008) Temp Source: Oral (05/24 0008) BP: 114/77 (05/24 0008) Pulse Rate: 86 (05/24 0008)  Labs: Recent Labs    08/25/19 0810 08/25/19 0810 08/25/19 1010 08/25/19 1408 08/25/19 1537 08/26/19 0041  HGB 16.4  --   --   --   --   --   HCT 45.7  --   --   --   --   --   PLT 225  --   --   --   --   --   HEPARINUNFRC  --   --   --   --  0.25* 0.36  CREATININE 1.01  --   --   --   --   --   TROPONINIHS 7,922*   < > 13,766* 14,441* 12,219*  --    < > = values in this interval not displayed.    Estimated Creatinine Clearance: 95.5 mL/min (by C-G formula based on SCr of 1.01 mg/dL).  Assessment: 83 YOM who presented to [facility] on [facility] with CP and concern for ACS/NSTEMI.  Heparin level this evening came back at 0.36, on 1400 units/hr. No s/sx of bleeding or infusion issues per nursing.   Goal of Therapy:  Heparin level 0.3-0.7 units/ml Monitor platelets by anticoagulation protocol: Yes   Plan:  Continue heparin infusion at 1400 units/hr Confirm heparin level in 6 hours  Monitor HL, CBC, and s/sx of bleeding   Antonietta Jewel, PharmD, BCCCP Clinical Pharmacist  08/26/2019 1:20 AM  Please check AMION for all [place] phone numbers After 10:00 PM, call [place] [phone]

## 2019-08-26 NOTE — Progress Notes (Signed)
ANTICOAGULATION CONSULT NOTE  Pharmacy Consult for Heparin  Indication: chest pain/ACS  No Known Allergies  Patient Measurements: Height: 5\' 10"  (177.8 cm) Weight: 97.3 kg (214 lb 6.4 oz) IBW/kg (Calculated) : 73 Heparin Dosing Weight: 93.9 kg  Vital Signs: Temp: 98.4 F (36.9 C) (05/24 1123) Temp Source: Oral (05/24 1123) BP: 116/80 (05/24 1300) Pulse Rate: 87 (05/24 1300)  Labs: Recent Labs    08/25/19 0810 08/25/19 0810 08/25/19 1010 08/25/19 1408 08/25/19 1537 08/26/19 0041 08/26/19 0749  HGB 16.4  --   --   --   --   --  13.8  HCT 45.7  --   --   --   --   --  39.9  PLT 225  --   --   --   --   --  157  HEPARINUNFRC  --   --   --   --  0.25* 0.36 0.49  CREATININE 1.01  --   --   --   --   --  0.88  TROPONINIHS 7,922*   < > 13,766* 14,441* 12,219*  --   --    < > = values in this interval not displayed.    Estimated Creatinine Clearance: 109.6 mL/min (by C-G formula based on SCr of 0.88 mg/dL).  Assessment: 55 YOM who presented to [facility] on [facility] with CP and concern for ACS/NSTEMI.  Heparin level at goal on 1400 units/hr. No s/sx of bleeding or infusion issues per nursing.   Heparin off for LHC this AM.  Now s/p cath, planning CABG tomorrow AM.  Pharmacy asked to restart heparin 2 hrs after TR band removed - was removed at 150 PM.  Goal of Therapy:  Heparin level 0.3-0.7 units/ml Monitor platelets by anticoagulation protocol: Yes   Plan:  Resume heparin infusion at 1400 units/hr at 4 pm today. Heparin level 6 hrs after gtt resumed. Monitor heparin level, CBC, and s/sx of bleeding  F/u plans for heparin after cath today.  Marguerite Olea, [facility] Clinical Pharmacist Phone [phone]  08/26/2019 2:09 PM

## 2019-08-26 NOTE — Progress Notes (Signed)
Patient given cardiac surgery patient education booklet and incentive spirometer and instructions for use. Will monitor. Carlyon Nolasco, Bettina Gavia Rn

## 2019-08-26 NOTE — Progress Notes (Signed)
CARDIAC REHAB PHASE I   Preop education completed with pt and wife. Pt demonstrating 2500 on IS. Pt given Cardiac surgery booklet along with in-the-tube sheet and OHS careguide. Reinforced importance of IS use, walks, and sternal precautions. Pt and wife deny further questions or concerns. Will continue to follow throughout hospital stay.   RD:[id number] Rufina Falco, RN BSN 08/26/2019 3:03 PM

## 2019-08-26 NOTE — Progress Notes (Signed)
ANTICOAGULATION CONSULT NOTE  Pharmacy Consult for Heparin  Indication: chest pain/ACS  No Known Allergies  Patient Measurements: Height: 5\' 10"  (177.8 cm) Weight: 97.3 kg (214 lb 6.4 oz) IBW/kg (Calculated) : 73 Heparin Dosing Weight: 93.9 kg  Vital Signs: Temp: 98.5 F (36.9 C) (05/24 2005) Temp Source: Oral (05/24 2005) BP: 127/81 (05/24 2005) Pulse Rate: 82 (05/24 1628)  Labs: Recent Labs    08/25/19 0810 08/25/19 0810 08/25/19 1010 08/25/19 1408 08/25/19 1537 08/25/19 1537 08/26/19 0041 08/26/19 0749 08/26/19 1526 08/26/19 2218  HGB 16.4  --   --   --   --   --   --  13.8  --   --   HCT 45.7  --   --   --   --   --   --  39.9  --   --   PLT 225  --   --   --   --   --   --  157  --   --   APTT  --   --   --   --   --   --   --   --  28  --   LABPROT  --   --   --   --   --   --   --   --  12.6  --   INR  --   --   --   --   --   --   --   --  1.0  --   HEPARINUNFRC  --   --   --   --  0.25*   < > 0.36 0.49  --  0.34  CREATININE 1.01  --   --   --   --   --   --  0.88  --   --   TROPONINIHS 7,922*   < > 13,766* 14,441* 12,219*  --   --   --   --   --    < > = values in this interval not displayed.    Estimated Creatinine Clearance: 109.6 mL/min (by C-G formula based on SCr of 0.88 mg/dL).  Assessment: 77 YOM who presented to [facility] on [facility] with CP and concern for ACS/NSTEMI.  Heparin off for LHC this AM.  Now s/p cath, planning CABG tomorrow AM.  Pharmacy asked to restart heparin 2 hrs after TR band removed - was removed at 1350.  Heparin level therapeutic (0.34) on gtt at 1400 units/hr.  Goal of Therapy:  Heparin level 0.3-0.7 units/ml Monitor platelets by anticoagulation protocol: Yes   Plan:  Continue heparin infusion at 1400 units/hr F/u heparin level in a.m. (pt to CABG 5/25)  Sherlon Handing, PharmD, BCPS Please see amion for complete clinical pharmacist phone list  08/26/2019 11:14 PM

## 2019-08-26 NOTE — Consult Note (Signed)
[address]       [name],[name] [address]             [phone]       Cardiothoracic Surgery Consultation  Reason for Consult: High-grade left main and three-vessel coronary disease Referring Physician: Dr. Harrell [name] End  [name] [name] is an 54 y.o. male.  HPI:   The patient is a 54 year old gentleman with history of poorly controlled type 2 diabetes, hypertension, hyperlipidemia, hypothyroidism, and OSA who presented with a 6 to 6-monthhistory of progressive exertional fatigue and a 1 to 248-monthistory of left-sided chest pressure and belching with some radiation of pain down his left arm.  His symptoms have been occurring with less activity and his [facility].  His initial high-sensitivity troponin was 7922 and increased to 14,441.  [facility] a [facility] non-ST segment elevation MI.  Cardiac catheterization today showed 90% distal left main coronary stenosis with 70 to 80% stenoses involving the LAD, diagonal, large obtuse marginal, and right coronary artery branches.  Left ventricular ejection fraction was reduced to 45% with anterior hypokinesis.  LVEDP was 14 mmHg.  2D echocardiogram today confirm ejection fraction of 40 to 45% with moderate hypokinesis of the mid apical anteroseptal wall.  There is grade 2 diastolic dysfunction.  There is no significant valvular abnormality.  He denies orthopnea and PND.  He has no peripheral edema.  He said that he drinks about 6 beers a day and smokes occasional marijuana.  He is retired as a inCharity fundraisernd works part-time.  He is married and lives with his wife.  Past Medical History:  Diagnosis Date  . Anxiety   . ASTHMA 10/28/2006  . Cough   . Diabetes mellitus type II 2000   age 54. GERD (gastroesophageal reflux disease)   . Hyperlipidemia   . Hypertension 1995  . Hypothyroidism 2002   thyroidectomy  for Goiter  . Lumbar back pain   . NASH (nonalcoholic steatohepatitis)   . Numbness   . OSA (obstructive sleep apnea) 11/19/2010  . Sleep apnea    Pt states mild case, doesn't use cpap    Past Surgical History:  Procedure Laterality Date  . bilateral shoulder surgery    . right knee repair  1997  . THYROIDECTOMY     right  . TRIGGER FINGER RELEASE    . TRIGGER FINGER RELEASE  02/23/2011   Procedure: RELEASE TRIGGER FINGER/A-1 PULLEY;  Surgeon: GrMeredith [name] [facility] Service: [facility]opedics;  Laterality: Right;  Right 4th trigger finger release    Family History  Problem Relation Age of Onset  . Osteoarthritis Father   . Heart attack Father   . Skin cancer Father   . Breast cancer Mother   . Colon cancer Neg Hx   . Esophageal cancer Neg Hx   . Rectal cancer Neg Hx   . Stomach cancer Neg Hx     Social History:  reports that he has never smoked. He has never used smokeless tobacco. He reports current alcohol use. He reports current drug use. Drug: Marijuana.  Allergies: No Known Allergies  Medications:  I have reviewed the patient's current medications. Prior to Admission:  Medications Prior to Admission  Medication Sig Dispense Refill Last Dose  . aspirin 81 MG tablet Take 81 mg by mouth daily.  08/24/2019 at Unknown time  . blood glucose meter kit and supplies KIT Dispense based on patient and insurance preference. Use up to 3 times daily as directed. (FOR ICD-10-E11.9 and Z79.4). 1 each 0   . citalopram (CELEXA) 40 MG tablet Take 1 tablet Daily for Mood (Patient taking differently: Take 40 mg by mouth daily. ) 90 tablet 0 08/24/2019 at Unknown time  . Insulin Isophane & Regular Human (NOVOLIN 70/30 FLEXPEN) (70-30) 100 UNIT/ML PEN Take 40 units with breakfast and 20-35 units with dinner (start with 20 units with dinner, slowly titrate up until fasting glucose is <150). 15 mL 11 08/24/2019 at Unknown time  . levothyroxine (SYNTHROID) 137 MCG tablet Take 1  tablet daily on an empty stomach with only water for 30 minutes & no Antacid meds, Calcium or Magnesium for 4 hours & avoid Biotin (Patient taking differently: Take 137 mcg by mouth daily before breakfast. ) 90 tablet 0 08/24/2019 at Unknown time  . losartan-hydrochlorothiazide (HYZAAR) 100-25 MG tablet Take 1 tablet Daily for BP & Fluid Retention / Ankle Swelling (Patient taking differently: Take 1 tablet by mouth daily. ) 90 tablet 0 08/24/2019 at Unknown time  . metFORMIN (GLUCOPHAGE-XR) 500 MG 24 hr tablet Take 2 tablets 2 x  /day with Meals for Diabetes (Patient taking differently: Take 1,000 mg by mouth 2 (two) times daily with a meal. ) 360 tablet 0 08/24/2019 at Unknown time  . MILK THISTLE PO Take 280 mg by mouth.   Past Month at Unknown time  . omeprazole (PRILOSEC) 40 MG capsule Take 1 capsule Daily for Indigestion & Reflux (Patient taking differently: Take 40 mg by mouth daily. ) 30 capsule 3 08/24/2019 at Unknown time  . predniSONE (DELTASONE) 20 MG tablet 1 tab 3 x /day as Directed for Sciatica (Patient taking differently: Take 20 mg by mouth as needed (pain). ) 30 tablet 0 unknown  . rosuvastatin (CRESTOR) 40 MG tablet Take 1 tablet Daily for Cholesterol (Patient taking differently: Take 40 mg by mouth daily. ) 90 tablet 0 08/24/2019 at Unknown time  . VITAMIN D PO Take 10,000 Units by mouth daily.    08/24/2019 at Unknown time  . famotidine (PEPCID) 20 MG tablet Take 1 tablet (20 mg total) by mouth 2 (two) times daily. (Patient not taking: Reported on 08/25/2019) 180 tablet 1 Not Taking at Unknown time   Scheduled: . aspirin  324 mg Oral NOW   Or  . aspirin  300 mg Rectal NOW  . aspirin EC  81 mg Oral Daily  . cholecalciferol  1,000 Units Oral Daily  . citalopram  40 mg Oral Daily  . famotidine  20 mg Oral BID  . losartan  100 mg Oral Daily   And  . hydrochlorothiazide  25 mg Oral Daily  . insulin aspart  0-15 Units Subcutaneous TID WC  . insulin aspart  0-5 Units Subcutaneous QHS  .  insulin aspart protamine- aspart  20 Units Subcutaneous Q supper   And  . insulin aspart protamine- aspart  40 Units Subcutaneous Q breakfast  . levothyroxine  137 mcg Oral Q0600  . mupirocin ointment  1 application Nasal BID  . pantoprazole  40 mg Oral Daily  . rosuvastatin  40 mg Oral Daily  . sodium chloride flush  3 mL Intravenous Q12H   Continuous: . sodium chloride 75 mL/hr at 08/26/19 1158  . sodium chloride     FMB:WGYKZL chloride, acetaminophen, hydrALAZINE, labetalol, nitroGLYCERIN, ondansetron (ZOFRAN) IV, sodium chloride flush  Anti-infectives (From admission, onward)   None      Results for orders placed or performed during the hospital encounter of 08/25/19 (from the past 48 hour(s))  CBC     Status: Abnormal   Collection Time: 08/25/19  8:10 AM  Result Value Ref Range   WBC 14.9 (H) 4.0 - 10.5 K/uL   RBC 5.10 4.22 - 5.81 MIL/uL   Hemoglobin 16.4 13.0 - 17.0 g/dL   HCT 45.7 39.0 - 52.0 %   MCV 89.6 80.0 - 100.0 fL   MCH 32.2 26.0 - 34.0 pg   MCHC 35.9 30.0 - 36.0 g/dL   RDW 11.9 11.5 - 15.5 %   Platelets 225 150 - 400 K/uL   nRBC 0.0 0.0 - 0.2 %    Comment: Performed at [facility], [address] [place], [place] [address]  Troponin I (High Sensitivity)     Status: Abnormal   Collection Time: 08/25/19  8:10 AM  Result Value Ref Range   Troponin I (High Sensitivity) 7,922 (HH) <18 ng/L    Comment: CRITICAL RESULT CALLED TO, READ BACK BY AND VERIFIED WITH: REED C RN [phone] PHILLIPS C (NOTE) Elevated high sensitivity troponin I (hsTnI) values and significant  changes across serial measurements may suggest ACS but many other  chronic and acute conditions are known to elevate hsTnI results.  Refer to the Links section for chest pain algorithms and additional  guidance. Performed at [facility], [address] [place], [place] [address]   Comprehensive metabolic panel     Status: Abnormal   Collection Time: 08/25/19  8:10 AM    Result Value Ref Range   Sodium 137 135 - 145 mmol/L   Potassium 4.5 3.5 - 5.1 mmol/L   Chloride 98 98 - 111 mmol/L   CO2 22 22 - 32 mmol/L   Glucose, Bld 356 (H) 70 - 99 mg/dL    Comment: Glucose reference range applies only to samples taken after fasting for at least 8 hours.   BUN 24 (H) 6 - 20 mg/dL   Creatinine, Ser 1.01 0.61 - 1.24 mg/dL   Calcium 9.4 8.9 - 10.3 mg/dL   Total Protein 8.4 (H) 6.5 - 8.1 g/dL   Albumin 4.7 3.5 - 5.0 g/dL   AST 87 (H) 15 - 41 U/L   ALT 36 0 - 44 U/L   Alkaline Phosphatase 81 38 - 126 U/L   Total Bilirubin 1.4 (H) 0.3 - 1.2 mg/dL   GFR calc non Af Amer >60 >60 mL/min   GFR calc Af Amer >60 >60 mL/min   Anion gap 17 (H) 5 - 15    Comment: Performed at [facility], [address] [place], [place] [address]  Lipase, blood     Status: None   Collection Time: 08/25/19  8:10 AM  Result Value Ref Range   Lipase 26 11 - 51 U/L    Comment: Performed at [facility], [address] [place], [place] [address]  D-dimer, quantitative (not at [facility])     Status: None   Collection Time: 08/25/19  8:10 AM  Result Value Ref Range   D-Dimer, Quant 0.35 0.00 - 0.50 ug/mL-FEU    Comment: (NOTE) At the manufacturer cut-off of 0.50 ug/mL FEU, this assay has been documented to exclude PE with a sensitivity and negative predictive value of 97 to 99%.  At this time, this assay has not been  approved by the FDA to exclude DVT/VTE. Results should be correlated with clinical presentation. Performed at [facility], [address] [place], [place] [address]   SARS Coronavirus 2 by RT PCR (hospital order, performed in [facility] hospital lab) Nasopharyngeal Nasopharyngeal Swab     Status: None   Collection Time: 08/25/19  9:20 AM   Specimen: Nasopharyngeal Swab  Result Value Ref Range   SARS Coronavirus 2 NEGATIVE NEGATIVE    Comment: (NOTE) SARS-CoV-2 target nucleic acids are NOT DETECTED. The SARS-CoV-2 RNA is generally  detectable in upper and lower respiratory specimens during the acute phase of infection. The lowest concentration of SARS-CoV-2 viral copies this assay can detect is 250 copies / mL. A negative result does not preclude SARS-CoV-2 infection and should not be used as the sole basis for treatment or other patient management decisions.  A negative result may occur with improper specimen collection / handling, submission of specimen other than nasopharyngeal swab, presence of viral mutation(s) within the areas targeted by this assay, and inadequate number of viral copies (<250 copies / mL). A negative result must be combined with clinical observations, patient history, and epidemiological information. Fact Sheet for Patients:   StrictlyIdeas.no Fact Sheet for Healthcare Providers: BankingDealers.co.za This test is not yet approved or cleared  by the [place] FDA and has been authorized for detection and/or diagnosis of SARS-CoV-2 by FDA under an Emergency Use Authorization (EUA).  This EUA will remain in effect (meaning this test can be used) for the duration of the COVID-19 declaration under Section 564(b)(1) of the Act, 21 U.S.C. section 360bbb-3(b)(1), unless the authorization is terminated or revoked sooner. Performed at [facility], [address] [place], [place] [address]   Troponin I (High Sensitivity)     Status: Abnormal   Collection Time: 08/25/19 10:10 AM  Result Value Ref Range   Troponin I (High Sensitivity) 13,766 (HH) <18 ng/L    Comment: CRITICAL RESULT CALLED TO, READ BACK BY AND VERIFIED WITH: REED C RN [phone] PHILLIPS C DELTA CHECK NOTED (NOTE) Elevated high sensitivity troponin I (hsTnI) values and significant  changes across serial measurements may suggest ACS but many other  chronic and acute conditions are known to elevate hsTnI results.  Refer to the Links section for chest pain algorithms and  additional  guidance. Performed at [facility], [address] [place], [place] [address]   Glucose, capillary     Status: Abnormal   Collection Time: 08/25/19  1:06 PM  Result Value Ref Range   Glucose-Capillary 295 (H) 70 - 99 mg/dL    Comment: Glucose reference range applies only to samples taken after fasting for at least 8 hours.  Glucose, capillary     Status: Abnormal   Collection Time: 08/25/19  1:07 PM  Result Value Ref Range   Glucose-Capillary 290 (H) 70 - 99 mg/dL    Comment: Glucose reference range applies only to samples taken after fasting for at least 8 hours.  HIV Antibody (routine testing w rflx)     Status: None   Collection Time: 08/25/19  2:08 PM  Result Value Ref Range   HIV Screen 4th Generation wRfx Non Reactive Non Reactive    Comment: Performed at [facility], [place] [address] [place], [place] [address]  Troponin I (High Sensitivity)     Status: Abnormal   Collection Time: 08/25/19  2:08 PM  Result Value Ref Range  Troponin I (High Sensitivity) 14,441 (HH) <18 ng/L    Comment: CRITICAL RESULT CALLED TO, READ BACK BY AND VERIFIED WITH: L.TYSON,RN 08/25/2019 1511 DAVISB (NOTE) Elevated high sensitivity troponin I (hsTnI) values and significant  changes across serial measurements may suggest ACS but many other  chronic and acute conditions are known to elevate hsTnI results.  Refer to the Links section for chest pain algorithms and additional  guidance. Performed at [facility], [place] [address], [place], [place] [address]   Hemoglobin A1c     Status: Abnormal   Collection Time: 08/25/19  2:08 PM  Result Value Ref Range   Hgb A1c MFr Bld 10.8 (H) 4.8 - 5.6 %    Comment: (NOTE) Pre diabetes:          5.7%-6.4% Diabetes:              >6.4% Glycemic control for   <7.0% adults with diabetes    Mean Plasma Glucose 263.26 mg/dL    Comment: Performed at [place] [address] [place], [place] [address]  Troponin I  (High Sensitivity)     Status: Abnormal   Collection Time: 08/25/19  3:37 PM  Result Value Ref Range   Troponin I (High Sensitivity) 12,219 (HH) <18 ng/L    Comment: CRITICAL VALUE NOTED.  VALUE IS CONSISTENT WITH PREVIOUSLY REPORTED AND CALLED VALUE. (NOTE) Elevated high sensitivity troponin I (hsTnI) values and significant  changes across serial measurements may suggest ACS but many other  chronic and acute conditions are known to elevate hsTnI results.  Refer to the Links section for chest pain algorithms and additional  guidance. Performed at [facility], [place] [address], [place], [place] [address]   Heparin level (unfractionated)     Status: Abnormal   Collection Time: 08/25/19  3:37 PM  Result Value Ref Range   Heparin Unfractionated 0.25 (L) 0.30 - 0.70 IU/mL    Comment: (NOTE) If heparin results are below expected values, and patient dosage has  been confirmed, suggest follow up testing of antithrombin III levels. Performed at [facility], [place] [address], [place], [place] [address]   Glucose, capillary     Status: Abnormal   Collection Time: 08/25/19  4:19 PM  Result Value Ref Range   Glucose-Capillary 357 (H) 70 - 99 mg/dL    Comment: Glucose reference range applies only to samples taken after fasting for at least 8 hours.  Surgical PCR screen     Status: None   Collection Time: 08/25/19  6:28 PM   Specimen: Nasal Mucosa; Nasal Swab  Result Value Ref Range   MRSA, PCR NEGATIVE NEGATIVE   Staphylococcus aureus NEGATIVE NEGATIVE    Comment: (NOTE) The Xpert SA Assay (FDA approved for NASAL specimens in patients 81 years of age and older), is one component of a comprehensive surveillance program. It is not intended to diagnose infection nor to guide or monitor treatment. Performed at [facility], [place] [address] [place], [place] [address]   Glucose, capillary     Status: Abnormal   Collection Time: 08/25/19  9:17 PM  Result Value Ref Range    Glucose-Capillary 223 (H) 70 - 99 mg/dL    Comment: Glucose reference range applies only to samples taken after fasting for at least 8 hours.   Comment 1 Document in Chart   Heparin level (unfractionated)     Status: None   Collection Time: 08/26/19 12:41 AM  Result Value  Ref Range   Heparin Unfractionated 0.36 0.30 - 0.70 IU/mL    Comment: (NOTE) If heparin results are below expected values, and patient dosage has  been confirmed, suggest follow up testing of antithrombin III levels. Performed at [facility], [place] [address], [place], [place] [address]   Glucose, capillary     Status: Abnormal   Collection Time: 08/26/19  6:23 AM  Result Value Ref Range   Glucose-Capillary 179 (H) 70 - 99 mg/dL    Comment: Glucose reference range applies only to samples taken after fasting for at least 8 hours.  Heparin level (unfractionated)     Status: None   Collection Time: 08/26/19  7:49 AM  Result Value Ref Range   Heparin Unfractionated 0.49 0.30 - 0.70 IU/mL    Comment: (NOTE) If heparin results are below expected values, and patient dosage has  been confirmed, suggest follow up testing of antithrombin III levels. Performed at [facility], [place] [address] [place], [place] [address]   Basic metabolic panel     Status: Abnormal   Collection Time: 08/26/19  7:49 AM  Result Value Ref Range   Sodium 135 135 - 145 mmol/L   Potassium 3.5 3.5 - 5.1 mmol/L   Chloride 101 98 - 111 mmol/L   CO2 23 22 - 32 mmol/L   Glucose, Bld 205 (H) 70 - 99 mg/dL    Comment: Glucose reference range applies only to samples taken after fasting for at least 8 hours.   BUN 19 6 - 20 mg/dL   Creatinine, Ser 0.88 0.61 - 1.24 mg/dL   Calcium 8.8 (L) 8.9 - 10.3 mg/dL   GFR calc non Af Amer >60 >60 mL/min   GFR calc Af Amer >60 >60 mL/min   Anion gap 11 5 - 15    Comment: Performed at [place] [address], [place] [address]  CBC     Status: None   Collection Time: 08/26/19  7:49  AM  Result Value Ref Range   WBC 7.8 4.0 - 10.5 K/uL   RBC 4.33 4.22 - 5.81 MIL/uL   Hemoglobin 13.8 13.0 - 17.0 g/dL   HCT 39.9 39.0 - 52.0 %   MCV 92.1 80.0 - 100.0 fL   MCH 31.9 26.0 - 34.0 pg   MCHC 34.6 30.0 - 36.0 g/dL   RDW 11.9 11.5 - 15.5 %   Platelets 157 150 - 400 K/uL   nRBC 0.0 0.0 - 0.2 %    Comment: Performed at [facility], [place] [address], [place], [place] [address]  Lipid panel     Status: None   Collection Time: 08/26/19  7:49 AM  Result Value Ref Range   Cholesterol 122 0 - 200 mg/dL   Triglycerides 116 <150 mg/dL   HDL 41 >40 mg/dL   Total CHOL/HDL Ratio 3.0 RATIO   VLDL 23 0 - 40 mg/dL   LDL Cholesterol 58 0 - 99 mg/dL    Comment:        Total Cholesterol/HDL:CHD Risk Coronary Heart Disease Risk Table                     Men   Women  1/2 Average Risk   3.4   3.3  Average Risk       5.0   4.4  2 X Average Risk   9.6   7.1  3 X Average Risk  23.4  11.0        Use the calculated Patient Ratio above and the CHD Risk Table to determine the patient's CHD Risk.        ATP III CLASSIFICATION (LDL):  <100     mg/dL   Optimal  100-129  mg/dL   Near or Above                    Optimal  130-159  mg/dL   Borderline  160-189  mg/dL   High  >190     mg/dL   Very High Performed at [place] [address], [place], [place] [address]   Glucose, capillary     Status: Abnormal   Collection Time: 08/26/19 11:28 AM  Result Value Ref Range   Glucose-Capillary 186 (H) 70 - 99 mg/dL    Comment: Glucose reference range applies only to samples taken after fasting for at least 8 hours.    DG Chest 2 View  Result Date: 08/25/2019 CLINICAL DATA:  Left-sided chest pain EXAM: CHEST - 2 VIEW COMPARISON:  02/21/2011 FINDINGS: Cardiac shadow is within normal limits. The lungs are well aerated bilaterally. Mild central vascular prominence is seen with mild interstitial edema. No sizable effusion is noted. No focal infiltrate is seen. IMPRESSION: Changes of  early CHF. Electronically Signed   By: Inez Catalina M.D.   On: 08/25/2019 08:52   CARDIAC CATHETERIZATION  Result Date: 08/26/2019 Conclusions: 1. Severe multivessel coronary artery disease, including 90% distal LMCA stenosis, as well as 70-80% lesions involving the mid LAD, D1, large OM1, and rPL branches. 2. Mildly to moderately reduced left ventricular contraction (LVEF ~45%) with anterior hypokinesis.  Upper normal left ventricular filling pressure. Conclusion: 1. Cardiac surgery consultation for CABG. 2. Restart heparin infusion 2 hours after TR band removal. 3. Aggressive secondary prevention. Nelva Bush, MD [facility]   ECHOCARDIOGRAM COMPLETE  Result Date: 08/26/2019    ECHOCARDIOGRAM REPORT   Patient Name:   [name] [name] Date of Exam: 08/26/2019 Medical Rec #:  [id number]           Height:       70.0 in Accession #:    [id number]          Weight:       214.4 lb Date of Birth:  Dec 21, 1965           BSA:          2.150 m Patient Age:    58 years            BP:           115/76 mmHg Patient Gender: M                   HR:           85 bpm. Exam Location:  Inpatient Procedure: 2D Echo, Cardiac Doppler and Color Doppler Indications:    121-121.4 Non-ST elevation (NSTEMI) myocardial infarction  History:        Patient has no prior history of Echocardiogram examinations.                 Risk Factors:Hypertension, Diabetes, Dyslipidemia and Sleep                 Apnea. GERD. Hypothyroidism.  Sonographer:    Tiffany Dance Referring Phys: [id number] TIFFANY [place] IMPRESSIONS  1. Left ventricular ejection fraction, by estimation, is 40 to 45%. The left ventricle has mildly decreased function.  The left ventricle demonstrates regional wall motion abnormalities (see scoring diagram/findings for description). Left ventricular diastolic parameters are consistent with Grade II diastolic dysfunction (pseudonormalization). There is moderate hypokinesis of the left ventricular, mid-apical anteroseptal  wall.  2. Right ventricular systolic function is normal. The right ventricular size is normal.  3. The mitral valve is normal in structure. Mild mitral valve regurgitation. No evidence of mitral stenosis.  4. The aortic valve is normal in structure. Aortic valve regurgitation is not visualized. No aortic stenosis is present. FINDINGS  Left Ventricle: Left ventricular ejection fraction, by estimation, is 40 to 45%. The left ventricle has mildly decreased function. The left ventricle demonstrates regional wall motion abnormalities. Moderate hypokinesis of the left ventricular, mid-apical anteroseptal wall. The left ventricular internal cavity size was normal in size. There is no left ventricular hypertrophy. Left ventricular diastolic parameters are consistent with Grade II diastolic dysfunction (pseudonormalization). Right Ventricle: The right ventricular size is normal. No increase in right ventricular wall thickness. Right ventricular systolic function is normal. Left Atrium: Left atrial size was normal in size. Right Atrium: Right atrial size was normal in size. Pericardium: There is no evidence of pericardial effusion. Mitral Valve: The mitral valve is normal in structure. Mild mitral valve regurgitation. No evidence of mitral valve stenosis. Tricuspid Valve: The tricuspid valve is normal in structure. Tricuspid valve regurgitation is trivial. No evidence of tricuspid stenosis. Aortic Valve: The aortic valve is normal in structure. Aortic valve regurgitation is not visualized. No aortic stenosis is present. Pulmonic Valve: The pulmonic valve was normal in structure. Pulmonic valve regurgitation is trivial. No evidence of pulmonic stenosis. Aorta: The aortic root and ascending aorta are structurally normal, with no evidence of dilitation. IAS/Shunts: The atrial septum is grossly normal.  LEFT VENTRICLE PLAX 2D LVIDd:         4.36 cm  Diastology LVIDs:         3.28 cm  LV e' lateral:   5.55 cm/s LV PW:         0.96  cm  LV E/e' lateral: 12.7 LV IVS:        1.13 cm  LV e' medial:    6.31 cm/s LVOT diam:     2.00 cm  LV E/e' medial:  11.1 LV SV:         53 LV SV Index:   25 LVOT Area:     3.14 cm  RIGHT VENTRICLE            IVC RV Basal diam:  2.67 cm    IVC diam: 1.45 cm RV S prime:     9.57 cm/s TAPSE (M-mode): 1.8 cm LEFT ATRIUM             Index       RIGHT ATRIUM           Index LA diam:        4.20 cm 1.95 cm/m  RA Area:     10.90 cm LA Vol (A2C):   65.7 ml 30.56 ml/m RA Volume:   23.10 ml  10.74 ml/m LA Vol (A4C):   52.1 ml 24.23 ml/m LA Biplane Vol: 58.7 ml 27.30 ml/m  AORTIC VALVE LVOT Vmax:   87.20 cm/s LVOT Vmean:  60.700 cm/s LVOT VTI:    0.170 m  AORTA Ao Root diam: 3.90 cm Ao Asc diam:  3.60 cm MITRAL VALVE MV Area (PHT): 4.21 cm    SHUNTS MV Decel Time: 180 msec    Systemic  VTI:  0.17 m MV E velocity: 70.30 cm/s  Systemic Diam: 2.00 cm MV A velocity: 66.50 cm/s MV E/A ratio:  1.06 Mertie Moores MD Electronically signed by Mertie Moores MD Signature Date/Time: 08/26/2019/11:35:44 AM    Final     Review of Systems  Constitutional: Positive for activity change and fatigue.  HENT: Negative.   Eyes: Negative.   Respiratory: Positive for shortness of breath.   Cardiovascular: Positive for chest pain. Negative for palpitations and leg swelling.  Gastrointestinal: Positive for nausea.  Endocrine: Negative.   Genitourinary: Negative.   Musculoskeletal: Negative.   Skin: Negative.   Allergic/Immunologic: Negative.   Neurological: Negative for dizziness and syncope.  Hematological: Negative.   Psychiatric/Behavioral: Negative.    Blood pressure 115/80, pulse 79, temperature 98.4 F (36.9 C), temperature source Oral, resp. rate 12, height '5\' 10"'$  (1.778 m), weight 97.3 kg, SpO2 99 %. Physical Exam  Constitutional: He is oriented to person, place, and time. He appears well-developed and well-nourished. No distress.  HENT:  Head: Normocephalic and atraumatic.  Mouth/Throat: Oropharynx is clear and  moist.  Eyes: Pupils are equal, round, and reactive to light. EOM are normal.  Neck: No JVD present.  Cardiovascular: Normal rate, regular rhythm, normal heart sounds and intact distal pulses.  No murmur heard. Respiratory: Effort normal and breath sounds normal. No respiratory distress.  GI: Soft. Bowel sounds are normal. He exhibits no distension. There is no abdominal tenderness.  Musculoskeletal:        General: No edema. Normal range of motion.     Cervical back: Normal range of motion and neck supple.  Neurological: He is alert and oriented to person, place, and time.  Skin: Skin is warm and dry.  Psychiatric: He has a normal mood and affect. His behavior is normal. Judgment and thought content normal.   ECHOCARDIOGRAM REPORT       Patient Name:  [name] [name] Date of Exam: 08/26/2019  Medical Rec #: [id number]      Height:    70.0 in  Accession #:  [id number]     Weight:    214.4 lb  Date of Birth: [birth year]      BSA:     2.150 m  Patient Age:  46 years      BP:      115/76 mmHg  Patient Gender: M          HR:      85 bpm.  Exam Location: Inpatient   Procedure: 2D Echo, Cardiac Doppler and Color Doppler   Indications:  121-121.4 Non-ST elevation (NSTEMI) myocardial infarction    History:    Patient has no prior history of Echocardiogram  examinations.         Risk Factors:Hypertension, Diabetes, Dyslipidemia and  Sleep         Apnea. GERD. Hypothyroidism.    Sonographer:  Tiffany Dance  Referring Phys: [id number] TIFFANY Fillmore   IMPRESSIONS    1. Left ventricular ejection fraction, by estimation, is 40 to 45%. The  left ventricle has mildly decreased function. The left ventricle  demonstrates regional wall motion abnormalities (see scoring  diagram/findings for description). Left ventricular  diastolic parameters are consistent with Grade II diastolic dysfunction    (pseudonormalization). There is moderate hypokinesis of the left  ventricular, mid-apical anteroseptal wall.  2. Right ventricular systolic function is normal. The right ventricular  size is normal.  3. The mitral valve is normal in structure. Mild mitral valve  regurgitation. No evidence  of mitral stenosis.  4. The aortic valve is normal in structure. Aortic valve regurgitation is  not visualized. No aortic stenosis is present.   FINDINGS  Left Ventricle: Left ventricular ejection fraction, by estimation, is 40  to 45%. The left ventricle has mildly decreased function. The left  ventricle demonstrates regional wall motion abnormalities. Moderate  hypokinesis of the left ventricular,  mid-apical anteroseptal wall. The left ventricular internal cavity size  was normal in size. There is no left ventricular hypertrophy. Left  ventricular diastolic parameters are consistent with Grade II diastolic  dysfunction (pseudonormalization).   Right Ventricle: The right ventricular size is normal. No increase in  right ventricular wall thickness. Right ventricular systolic function is  normal.   Left Atrium: Left atrial size was normal in size.   Right Atrium: Right atrial size was normal in size.   Pericardium: There is no evidence of pericardial effusion.   Mitral Valve: The mitral valve is normal in structure. Mild mitral valve  regurgitation. No evidence of mitral valve stenosis.   Tricuspid Valve: The tricuspid valve is normal in structure. Tricuspid  valve regurgitation is trivial. No evidence of tricuspid stenosis.   Aortic Valve: The aortic valve is normal in structure. Aortic valve  regurgitation is not visualized. No aortic stenosis is present.   Pulmonic Valve: The pulmonic valve was normal in structure. Pulmonic valve  regurgitation is trivial. No evidence of pulmonic stenosis.   Aorta: The aortic root and ascending aorta are structurally normal, with  no evidence of  dilitation.   IAS/Shunts: The atrial septum is grossly normal.     LEFT VENTRICLE  PLAX 2D  LVIDd:     4.36 cm Diastology  LVIDs:     3.28 cm LV e' lateral:  5.55 cm/s  LV PW:     0.96 cm LV E/e' lateral: 12.7  LV IVS:    1.13 cm LV e' medial:  6.31 cm/s  LVOT diam:   2.00 cm LV E/e' medial: 11.1  LV SV:     53  LV SV Index:  25  LVOT Area:   3.14 cm     RIGHT VENTRICLE      IVC  RV Basal diam: 2.67 cm  IVC diam: 1.45 cm  RV S prime:   9.57 cm/s  TAPSE (M-mode): 1.8 cm   LEFT ATRIUM       Index    RIGHT ATRIUM      Index  LA diam:    4.20 cm 1.95 cm/m RA Area:   10.90 cm  LA Vol (A2C):  65.7 ml 30.56 ml/m RA Volume:  23.10 ml 10.74 ml/m  LA Vol (A4C):  52.1 ml 24.23 ml/m  LA Biplane Vol: 58.7 ml 27.30 ml/m  AORTIC VALVE  LVOT Vmax:  87.20 cm/s  LVOT Vmean: 60.700 cm/s  LVOT VTI:  0.170 m    AORTA  Ao Root diam: 3.90 cm  Ao Asc diam: 3.60 cm   MITRAL VALVE  MV Area (PHT): 4.21 cm  SHUNTS  MV Decel Time: 180 msec  Systemic VTI: 0.17 m  MV E velocity: 70.30 cm/s Systemic Diam: 2.00 cm  MV A velocity: 66.50 cm/s  MV E/A ratio: 1.06   Mertie Moores MD  Electronically signed by Mertie Moores MD  Signature Date/Time: 08/26/2019/11:35:44 AM      Physicians    Panel Physicians  Referring Physician  Case Authorizing Physician   End, [name] Gave, MD (Primary)  Procedures   LEFT HEART CATH AND CORONARY ANGIOGRAPHY         Conclusion      Conclusions:  1.Severe multivessel coronary artery disease, including 90% distal LMCA stenosis, as well as 70-80% lesions involving the mid LAD, D1, large OM1, and rPL branches.   2.Mildly to moderately reduced left ventricular contraction (LVEF ~45%) with anterior hypokinesis.  Upper normal left ventricular filling pressure.      Conclusion:  1.Cardiac surgery consultation for CABG.   2.Restart  heparin infusion 2 hours after TR band removal.   3.Aggressive secondary prevention.      Nelva Bush, MD  [facility]            Recommendations   Antiplatelet/Anticoag Recommend uninterrupted dual antiplatelet therapy with Aspirin '81mg'$  daily and Clopidogrel '75mg'$  daily for a minimum of 12 months (ACS-Class I recommendation). Defer starting P2Y12 inhibitor pending cardiac surgery evaluation.   Discharge Date Anticipated discharge date to be determined.          Indications   Non-ST elevation (NSTEMI) myocardial infarction (HCC) [I21.4 (ICD-10-CM)]        Procedural Details   Technical Details Indication: 54 y.o. year-old man with history of hypertension, hyperlipidemia, asthma, EtoH abuse, diabetes, and GERD, admitted with NSTEMI.  GFR: >60 ml/min  Procedure: The risks, benefits, complications, treatment options, and expected outcomes were discussed with the patient. The patient and/or family concurred with the proposed plan, giving informed consent. The patient was sedated with IV midazolam and fentanyl. The right wrist was assessed with a modified Allens test which was normal. The right wrist was prepped and draped in a sterile fashion. 1% lidocaine was used for local anesthesia. Using the modified Seldinger access technique, a 29F slender Glidesheath was placed in the right radial artery. 3 mg Verapamil was given through the sheath. Heparin 5,000 units were administered.  Selective coronary angiography was performed using 43F JL3.5 and JR4 catheters to engage the left and right coronary arteries, respectively. Left heart catheterization was performed using a 43F JR4 catheter. Left ventriculogram was performed with a hand injection of contrast.  At the end of the procedure, the radial artery sheath was removed and a TR band applied to achieve patent hemostasis. There were no immediate complications. The patient was taken to the recovery area in stable  condition.  Estimated blood loss <50 mL.   During this procedure medications were administered to achieve and maintain moderate conscious sedation while the patient's heart rate, blood pressure, and oxygen saturation were continuously monitored and I was present face-to-face 100% of this time.        Medications   (Filter: Administrations occurring from 08/26/19 0958 to 08/26/19 1032)      Continuous medications are totaled by the amount administered until 08/26/19 1032.      Heparin (Porcine) in NaCl 1000-0.9 UT/500ML-% SOLN (mL) Total volume:  1,000 mL      Date/Time     Rate/Dose/Volume  Action  08/26/19 1002  500 mL Given  1002  500 mL Given        fentaNYL (SUBLIMAZE) injection (mcg) Total dose:  50 mcg      Date/Time     Rate/Dose/Volume  Action  08/26/19 1002  50 mcg Given        midazolam (VERSED) injection (mg) Total dose:  1 mg      Date/Time     Rate/Dose/Volume  Action  08/26/19 1002  1 mg Given  lidocaine (PF) (XYLOCAINE) 1 % injection (mL) Total volume:  2 mL      Date/Time     Rate/Dose/Volume  Action  08/26/19 1008  2 mL Given        Radial Cocktail/Verapamil only (mL) Total volume:  10 mL      Date/Time     Rate/Dose/Volume  Action  08/26/19 1009  10 mL Given        heparin sodium (porcine) injection (Units) Total dose:  5,000 Units      Date/Time     Rate/Dose/Volume  Action  08/26/19 1012  5,000 Units Given        iohexol (OMNIPAQUE) 350 MG/ML injection (mL) Total volume:  45 mL      Date/Time     Rate/Dose/Volume  Action  08/26/19 1025  45 mL Given        acetaminophen (TYLENOL) tablet 650 mg (mg) Total dose:  Cannot be calculated* Dosing weight:  97.3    *Administration dose not documented    Date/Time     Rate/Dose/Volume  Action  08/26/19 0958  *Not included in total MAR Hold        aspirin EC tablet 81 mg (mg)  Total dose:  Cannot be calculated* Dosing weight:  97.3    *Administration dose not documented    Date/Time     Rate/Dose/Volume  Action  08/26/19 0958  *Not included in total MAR Hold        cholecalciferol (VITAMIN D3) tablet 1,000 Units (Units) Total dose:  Cannot be calculated* Dosing weight:  97.3    *Administration dose not documented    Date/Time     Rate/Dose/Volume  Action  08/26/19 0958  *Not included in total MAR Hold        citalopram (CELEXA) tablet 40 mg (mg) Total dose:  Cannot be calculated*    *Administration dose not documented    Date/Time     Rate/Dose/Volume  Action  08/26/19 0958  *Not included in total MAR Hold        famotidine (PEPCID) tablet 20 mg (mg) Total dose:  Cannot be calculated* Dosing weight:  97.3    *Administration dose not documented    Date/Time     Rate/Dose/Volume  Action  08/26/19 0958  *Not included in total MAR Hold        insulin aspart (novoLOG) injection 0-15 Units (Units) Total dose:  Cannot be calculated* Dosing weight:  97.3    *Administration dose not documented    Date/Time     Rate/Dose/Volume  Action  08/26/19 0958  *Not included in total MAR Hold        insulin aspart (novoLOG) injection 0-5 Units (Units) Total dose:  Cannot be calculated* Dosing weight:  97.3    *Administration dose not documented    Date/Time     Rate/Dose/Volume  Action  08/26/19 0958  *Not included in total MAR Hold        levothyroxine (SYNTHROID) tablet 137 mcg (mcg) Total dose:  Cannot be calculated*    *Administration dose not documented    Date/Time     Rate/Dose/Volume  Action  08/26/19 0958  *Not included in total MAR Hold        mupirocin ointment (BACTROBAN) 2 % 1 application (application) Total dose:  Cannot be calculated* Dosing weight:  97.3    *Administration dose not  documented    Date/Time     Rate/Dose/Volume  Action  08/26/19 0958  *Not included in total [facility]  Hold  1000  *Not included in total Automatically Held        nitroGLYCERIN (NITROSTAT) SL tablet 0.4 mg (mg) Total dose:  Cannot be calculated* Dosing weight:  97.3    *Administration dose not documented    Date/Time     Rate/Dose/Volume  Action  08/26/19 0958  *Not included in total MAR Hold        ondansetron (ZOFRAN) injection 4 mg (mg) Total dose:  Cannot be calculated* Dosing weight:  97.3    *Administration dose not documented    Date/Time     Rate/Dose/Volume  Action  08/26/19 0958  *Not included in total MAR Hold        pantoprazole (PROTONIX) EC tablet 40 mg (mg) Total dose:  Cannot be calculated* Dosing weight:  97.3    *Administration dose not documented    Date/Time     Rate/Dose/Volume  Action  08/26/19 0958  *Not included in total MAR Hold        rosuvastatin (CRESTOR) tablet 40 mg (mg) Total dose:  Cannot be calculated*    *Administration dose not documented    Date/Time     Rate/Dose/Volume  Action  08/26/19 0958  *Not included in total MAR Hold          Sedation Time      Sedation Time Physician-1: 21 minutes 50 seconds           Contrast       Medication Name   Total Dose    iohexol (OMNIPAQUE) 350 MG/ML injection   45 mL           Radiation/Fluoro      Fluoro time: 3 (min)  DAP: 21.3 (Gycm2)  Cumulative Air Kerma: 454.1 (mGy)         Complications      Complications documented before study signed (08/26/2019 10:54 AM)       No complications were associated with this study.  Documented by Nelva Bush, MD - 08/26/2019 10:52 AM          Coronary Findings     Diagnostic Dominance: Right   Left Main  Vessel is large.  Mid LM to Dist LM lesion 90% stenosed  Mid LM to Dist LM lesion is 90% stenosed. The lesion is  discrete. The lesion is severely calcified.   Left Anterior Descending  Vessel is large.  Mid LAD lesion 70% stenosed  Mid LAD lesion is 70% stenosed.   First Diagonal Branch  Vessel is moderate in size.  1st Diag lesion 80% stenosed  1st Diag lesion is 80% stenosed.   Second Diagonal Branch  Vessel is moderate in size.   Left Circumflex  Vessel is large. There is moderate diffuse disease throughout the vessel.   First Obtuse Marginal Branch  Vessel is large in size.  1st Mrg-1 lesion 50% stenosed  1st Mrg-1 lesion is 50% stenosed.  1st Mrg-2 lesion 80% stenosed  1st Mrg-2 lesion is 80% stenosed.   Second Obtuse Marginal Branch  Vessel is small in size.   Third Obtuse Marginal Branch  Vessel is small in size.   Right Coronary Artery  Vessel is large. There is mild diffuse disease throughout the vessel.   Right Posterior Descending Artery  Vessel is large in size.  RPDA-1 lesion 50% stenosed  RPDA-1 lesion is 50% stenosed.  RPDA-2 lesion 40% stenosed  RPDA-2 lesion is 40% stenosed.   First Right Posterolateral Branch  Vessel is moderate in size.  1st  RPL lesion 90% stenosed  1st RPL lesion is 90% stenosed.   Second Right Posterolateral Branch  Vessel is moderate in size.  2nd RPL lesion 70% stenosed  2nd RPL lesion is 70% stenosed.     Intervention   No interventions have been documented.                   Wall Motion     Resting         The mid inferior, basilar inferior and apical inferior segments are normal. The mid anterior, basilar anterior and apical anterior segments are hypokinetic.                  Left Heart   Left Ventricle The left ventricular size is normal. There is mild to moderate left ventricular systolic dysfunction. LV end diastolic pressure is normal. The left ventricular ejection fraction is 45-50% by visual estimate.   Aortic Valve There is no aortic valve stenosis.        Coronary  Diagrams     Diagnostic Dominance: Right   Diagnostic Image    Intervention           Implants      No implant documentation for this case.         [employer] Images    Show images for CARDIAC CATHETERIZATION        Images on Long Term Storage    Show images for Hawk, Mones to Procedure Log   Procedure Log             Hemo Data       Most Recent Value   AO Systolic Pressure 836 mmHg  AO Diastolic Pressure 65 mmHg  AO Mean 82 mmHg  LV Systolic Pressure 629 mmHg  LV Diastolic Pressure 0 mmHg  LV EDP 14 mmHg  AOp Systolic Pressure 476 mmHg  AOp Diastolic Pressure 65 mmHg  AOp Mean Pressure 83 mmHg  LVp Systolic Pressure 546 mmHg  LVp Diastolic Pressure 0 mmHg  LVp EDP Pressure 16 mmHg     Assessment/Plan:  This 54 year old poorly controlled diabetic presents with high-grade left main and severe three-vessel coronary disease with unstable anginal symptoms and a non-ST segment elevation MI.  I personally reviewed his cardiac catheterization and echocardiogram studies and reviewed them with him.  I agree that coronary bypass graft surgery is the best treatment for resolution of his symptoms and to prevent further loss of myocardium. I discussed the operative procedure with the patient including alternatives, benefits and risks; including but not limited to bleeding, blood transfusion, infection, stroke, myocardial infarction, graft failure, heart block requiring a permanent pacemaker, organ dysfunction, and death.  [name] [name] understands and agrees to proceed.  We will schedule surgery for tomorrow morning.  I spent 40 minutes performing this consultation and > 50% of this time was spent face to face counseling and coordinating the care of this patient's severe multivessel coronary disease.  Fernande Boyden Laurajean Hosek 08/26/2019, 1:01 PM

## 2019-08-26 NOTE — Progress Notes (Signed)
Patient TR band off and dressing applied.no bleeding,  bruising or hematoma noted at this time. Will monitor. [name] [name], [name] Gavia rN

## 2019-08-26 NOTE — Progress Notes (Signed)
TCTS consulted for CABG evaluation. °

## 2019-08-26 NOTE — Progress Notes (Signed)
  Echocardiogram 2D Echocardiogram has been performed.  [name] [name] 08/26/2019, 9:37 AM

## 2019-08-26 NOTE — Progress Notes (Signed)
Progress Note  Patient Name: [name] [name] Date of Encounter: 08/26/2019  Primary Cardiologist: Skeet Latch, MD (new)  Subjective   No more chest pain or nausea.  OK with having surgery and ready to start the rehab process.  Inpatient Medications    Scheduled Meds: . aspirin EC  81 mg Oral Daily  . cholecalciferol  1,000 Units Oral Daily  . citalopram  40 mg Oral Daily  . [START ON 08/27/2019] diazepam  5 mg Oral Once  . [START ON 08/27/2019] epinephrine  0-10 mcg/min Intravenous To OR  . famotidine  20 mg Oral BID  . [START ON 08/27/2019] heparin-papaverine-plasmalyte irrigation   Irrigation To OR  . losartan  100 mg Oral Daily   And  . hydrochlorothiazide  25 mg Oral Daily  . insulin aspart  0-15 Units Subcutaneous TID WC  . insulin aspart  0-5 Units Subcutaneous QHS  . insulin aspart protamine- aspart  20 Units Subcutaneous Q supper   And  . insulin aspart protamine- aspart  40 Units Subcutaneous Q breakfast  . [START ON 08/27/2019] insulin   Intravenous To OR  . levothyroxine  137 mcg Oral Q0600  . [START ON 08/27/2019] magnesium sulfate  40 mEq Other To OR  . [START ON 08/27/2019] metoprolol tartrate  12.5 mg Oral Once  . mupirocin ointment  1 application Nasal BID  . pantoprazole  40 mg Oral Daily  . [START ON 08/27/2019] phenylephrine  30-200 mcg/min Intravenous To OR  . [START ON 08/27/2019] potassium chloride  80 mEq Other To OR  . rosuvastatin  40 mg Oral Daily  . sodium chloride flush  3 mL Intravenous Q12H  . [START ON 08/27/2019] tranexamic acid  15 mg/kg Intravenous To OR  . [START ON 08/27/2019] tranexamic acid  2 mg/kg Intracatheter To OR   Continuous Infusions: . sodium chloride    . [START ON 08/27/2019] cefUROXime (ZINACEF)  IV    . [START ON 08/27/2019] cefUROXime (ZINACEF)  IV    . [START ON 08/27/2019] dexmedetomidine    . [START ON 08/27/2019] heparin 30,000 units/NS 1000 mL solution for CELLSAVER    . heparin 1,400 Units/hr (08/26/19 1605)  .  [START ON 08/27/2019] milrinone    . [START ON 08/27/2019] nitroGLYCERIN    . [START ON 08/27/2019] norepinephrine    . [START ON 08/27/2019] tranexamic acid (CYKLOKAPRON) infusion ([facility])    . [START ON 08/27/2019] vancomycin     PRN Meds: sodium chloride, acetaminophen, nitroGLYCERIN, ondansetron (ZOFRAN) IV, sodium chloride flush, temazepam   Vital Signs    Vitals:   08/26/19 1232 08/26/19 1300 08/26/19 1611 08/26/19 1628  BP: 126/80 116/80 117/75   Pulse: 85 87 85 82  Resp: 13 (!) 22 (!) 28 17  Temp:   97.9 F (36.6 C)   TempSrc:   Oral   SpO2: 100% 96% 97% (!) 83%  Weight:      Height:        Intake/Output Summary (Last 24 hours) at 08/26/2019 1719 Last data filed at 08/26/2019 1500 Gross per 24 hour  Intake 813.29 ml  Output 1200 ml  Net -386.71 ml   Last 3 Weights 08/25/2019 08/25/2019 05/31/2019  Weight (lbs) 214 lb 6.4 oz 221 lb 221 lb 6.4 oz  Weight (kg) 97.251 kg 100.245 kg 100.426 kg      Telemetry    Sinus rhythm - Personally Reviewed  ECG    08/26/19: Sinus rhythm.  Rate 79 bpm.  Inferior infarct - Personally Reviewed  Physical Exam   VS:  BP 117/75 (BP Location: Left Arm)   Pulse 82   Temp 97.9 F (36.6 C) (Oral)   Resp 17   Ht 5\' 10"  (1.778 m)   Wt 97.3 kg   SpO2 (!) 83%   BMI 30.76 kg/m  , BMI Body mass index is 30.76 kg/m. GENERAL:  Well appearing HEENT: Pupils equal round and reactive, fundi not visualized, oral mucosa unremarkable NECK:  No jugular venous distention, waveform within normal limits, carotid upstroke brisk and symmetric, no bruits LUNGS:  Clear to auscultation bilaterally HEART:  RRR.  PMI not displaced or sustained,S1 and S2 within normal limits, no S3, no S4, no clicks, no rubs, no murmurs ABD:  Flat, positive bowel sounds normal in frequency in pitch, no bruits, no rebound, no guarding, no midline pulsatile mass, no hepatomegaly, no splenomegaly EXT:  2 plus pulses throughout, no edema, no cyanosis no clubbing SKIN:  No rashes no  nodules NEURO:  Cranial nerves II through XII grossly intact, motor grossly intact throughout [facility]:  Cognitively intact, oriented to person place and time  Labs    High Sensitivity Troponin:   Recent Labs  Lab 08/25/19 0810 08/25/19 1010 08/25/19 1408 08/25/19 1537  TROPONINIHS 7,922* 13,766* 14,441* 12,219*      Chemistry Recent Labs  Lab 08/25/19 0810 08/26/19 0749  NA 137 135  K 4.5 3.5  CL 98 101  CO2 22 23  GLUCOSE 356* 205*  BUN 24* 19  CREATININE 1.01 0.88  CALCIUM 9.4 8.8*  PROT 8.4*  --   ALBUMIN 4.7  --   AST 87*  --   ALT 36  --   ALKPHOS 81  --   BILITOT 1.4*  --   GFRNONAA >60 >60  GFRAA >60 >60  ANIONGAP 17* 11     Hematology Recent Labs  Lab 08/25/19 0810 08/26/19 0749  WBC 14.9* 7.8  RBC 5.10 4.33  HGB 16.4 13.8  HCT 45.7 39.9  MCV 89.6 92.1  MCH 32.2 31.9  MCHC 35.9 34.6  RDW 11.9 11.9  PLT 225 157    BNPNo results for input(s): BNP, PROBNP in the last 168 hours.   DDimer  Recent Labs  Lab 08/25/19 0810  DDIMER 0.35     Radiology    DG Chest 2 View  Result Date: 08/25/2019 CLINICAL DATA:  Left-sided chest pain EXAM: CHEST - 2 VIEW COMPARISON:  02/21/2011 FINDINGS: Cardiac shadow is within normal limits. The lungs are well aerated bilaterally. Mild central vascular prominence is seen with mild interstitial edema. No sizable effusion is noted. No focal infiltrate is seen. IMPRESSION: Changes of early CHF. Electronically Signed   By: Inez Catalina M.D.   On: 08/25/2019 08:52   CARDIAC CATHETERIZATION  Result Date: 08/26/2019 Conclusions: 1. Severe multivessel coronary artery disease, including 90% distal LMCA stenosis, as well as 70-80% lesions involving the mid LAD, D1, large OM1, and rPL branches. 2. Mildly to moderately reduced left ventricular contraction (LVEF ~45%) with anterior hypokinesis.  Upper normal left ventricular filling pressure. Conclusion: 1. Cardiac surgery consultation for CABG. 2. Restart heparin infusion 2  hours after TR band removal. 3. Aggressive secondary prevention. Nelva Bush, MD [facility]   ECHOCARDIOGRAM COMPLETE  Result Date: 08/26/2019    ECHOCARDIOGRAM REPORT   Patient Name:   [name] [name] Date of Exam: 08/26/2019 Medical Rec #:  [id number]           Height:       70.0  in Accession #:    [id number]          Weight:       214.4 lb Date of Birth:  12/21/1965           BSA:          2.150 m Patient Age:    55 years            BP:           115/76 mmHg Patient Gender: M                   HR:           85 bpm. Exam Location:  Inpatient Procedure: 2D Echo, Cardiac Doppler and Color Doppler Indications:    121-121.4 Non-ST elevation (NSTEMI) myocardial infarction  History:        Patient has no prior history of Echocardiogram examinations.                 Risk Factors:Hypertension, Diabetes, Dyslipidemia and Sleep                 Apnea. GERD. Hypothyroidism.  Sonographer:    Ronica Vivian Dance Referring Phys: [id number] [place] Doster Dane IMPRESSIONS  1. Left ventricular ejection fraction, by estimation, is 40 to 45%. The left ventricle has mildly decreased function. The left ventricle demonstrates regional wall motion abnormalities (see scoring diagram/findings for description). Left ventricular diastolic parameters are consistent with Grade II diastolic dysfunction (pseudonormalization). There is moderate hypokinesis of the left ventricular, mid-apical anteroseptal wall.  2. Right ventricular systolic function is normal. The right ventricular size is normal.  3. The mitral valve is normal in structure. Mild mitral valve regurgitation. No evidence of mitral stenosis.  4. The aortic valve is normal in structure. Aortic valve regurgitation is not visualized. No aortic stenosis is present. FINDINGS  Left Ventricle: Left ventricular ejection fraction, by estimation, is 40 to 45%. The left ventricle has mildly decreased function. The left ventricle demonstrates regional wall motion abnormalities. Moderate  hypokinesis of the left ventricular, mid-apical anteroseptal wall. The left ventricular internal cavity size was normal in size. There is no left ventricular hypertrophy. Left ventricular diastolic parameters are consistent with Grade II diastolic dysfunction (pseudonormalization). Right Ventricle: The right ventricular size is normal. No increase in right ventricular wall thickness. Right ventricular systolic function is normal. Left Atrium: Left atrial size was normal in size. Right Atrium: Right atrial size was normal in size. Pericardium: There is no evidence of pericardial effusion. Mitral Valve: The mitral valve is normal in structure. Mild mitral valve regurgitation. No evidence of mitral valve stenosis. Tricuspid Valve: The tricuspid valve is normal in structure. Tricuspid valve regurgitation is trivial. No evidence of tricuspid stenosis. Aortic Valve: The aortic valve is normal in structure. Aortic valve regurgitation is not visualized. No aortic stenosis is present. Pulmonic Valve: The pulmonic valve was normal in structure. Pulmonic valve regurgitation is trivial. No evidence of pulmonic stenosis. Aorta: The aortic root and ascending aorta are structurally normal, with no evidence of dilitation. IAS/Shunts: The atrial septum is grossly normal.  LEFT VENTRICLE PLAX 2D LVIDd:         4.36 cm  Diastology LVIDs:         3.28 cm  LV e' lateral:   5.55 cm/s LV PW:         0.96 cm  LV E/e' lateral: 12.7 LV IVS:        1.13 cm  LV e' medial:  6.31 cm/s LVOT diam:     2.00 cm  LV E/e' medial:  11.1 LV SV:         53 LV SV Index:   25 LVOT Area:     3.14 cm  RIGHT VENTRICLE            IVC RV Basal diam:  2.67 cm    IVC diam: 1.45 cm RV S prime:     9.57 cm/s TAPSE (M-mode): 1.8 cm LEFT ATRIUM             Index       RIGHT ATRIUM           Index LA diam:        4.20 cm 1.95 cm/m  RA Area:     10.90 cm LA Vol (A2C):   65.7 ml 30.56 ml/m RA Volume:   23.10 ml  10.74 ml/m LA Vol (A4C):   52.1 ml 24.23 ml/m LA  Biplane Vol: 58.7 ml 27.30 ml/m  AORTIC VALVE LVOT Vmax:   87.20 cm/s LVOT Vmean:  60.700 cm/s LVOT VTI:    0.170 m  AORTA Ao Root diam: 3.90 cm Ao Asc diam:  3.60 cm MITRAL VALVE MV Area (PHT): 4.21 cm    SHUNTS MV Decel Time: 180 msec    Systemic VTI:  0.17 m MV E velocity: 70.30 cm/s  Systemic Diam: 2.00 cm MV A velocity: 66.50 cm/s MV E/A ratio:  1.06 Mertie Moores MD Electronically signed by Mertie Moores MD Signature Date/Time: 08/26/2019/11:35:44 AM    Final    VAS US DOPPLER PRE CABG  Result Date: 08/26/2019 PREOPERATIVE VASCULAR EVALUATION  Indications:      Pre-CABG. Risk Factors:     Hypertension, hyperlipidemia, Diabetes, coronary artery                   disease. Other Factors:    ETOH abouse. Comparison Study: Prior ABI from 07/14/16 is available for comaprison Performing Technologist: Sharion Dove RVS  Examination Guidelines: A complete evaluation includes B-mode imaging, spectral Doppler, color Doppler, and power Doppler as needed of all accessible portions of each vessel. Bilateral testing is considered an integral part of a complete examination. Limited examinations for reoccurring indications may be performed as noted.  Right Carotid Findings: +----------+--------+--------+--------+------------+------------------+           PSV cm/sEDV cm/sStenosisDescribe    Comments           +----------+--------+--------+--------+------------+------------------+ CCA Prox  76      20                          intimal thickening +----------+--------+--------+--------+------------+------------------+ CCA Distal65      19                          intimal thickening +----------+--------+--------+--------+------------+------------------+ ICA Prox  105     43      40-59%  heterogenous                   +----------+--------+--------+--------+------------+------------------+ ICA Distal92      36                                              +----------+--------+--------+--------+------------+------------------+ ECA       106     14                                             +----------+--------+--------+--------+------------+------------------+  Portions of this table do not appear on this page. +----------+--------+-------+--------+------------+           PSV cm/sEDV cmsDescribeArm Pressure +----------+--------+-------+--------+------------+ Subclavian87                                  +----------+--------+-------+--------+------------+ +---------+--------+--+--------+--+ VertebralPSV cm/s31EDV cm/s12 +---------+--------+--+--------+--+ Left Carotid Findings: +----------+--------+--------+--------+--------+------------------+           PSV cm/sEDV cm/sStenosisDescribeComments           +----------+--------+--------+--------+--------+------------------+ CCA Prox  88      20                      intimal thickening +----------+--------+--------+--------+--------+------------------+ CCA Distal88      24                      intimal thickening +----------+--------+--------+--------+--------+------------------+ ICA Prox  85      23              calcific                   +----------+--------+--------+--------+--------+------------------+ ICA Distal93      36                                         +----------+--------+--------+--------+--------+------------------+ ECA       85      8                                          +----------+--------+--------+--------+--------+------------------+ +----------+--------+--------+--------+------------+ SubclavianPSV cm/sEDV cm/sDescribeArm Pressure +----------+--------+--------+--------+------------+           72                                   +----------+--------+--------+--------+------------+ +---------+--------+--+--------+--+ VertebralPSV cm/s35EDV cm/s13 +---------+--------+--+--------+--+  ABI Findings:  +--------+------------------+-----+---------+-----------------------+ Right   Rt Pressure (mmHg)IndexWaveform Comment                 +--------+------------------+-----+---------+-----------------------+ Brachial                       triphasicrestricted (cath today) +--------+------------------+-----+---------+-----------------------+ PTA     141               1.32 biphasic                         +--------+------------------+-----+---------+-----------------------+ DP      130               1.21 biphasic                         +--------+------------------+-----+---------+-----------------------+ +--------+------------------+-----+---------+-------+ Left    Lt Pressure (mmHg)IndexWaveform Comment +--------+------------------+-----+---------+-------+ [id number]                    triphasic        +--------+------------------+-----+---------+-------+ PTA     141               1.32 biphasic         +--------+------------------+-----+---------+-------+ DP      130  1.21 biphasic         +--------+------------------+-----+---------+-------+ +-------+---------------+----------------+ ABI/TBIToday's ABI/TBIPrevious ABI/TBI +-------+---------------+----------------+ Right  1.32           1.15             +-------+---------------+----------------+ Left   1.32           1.11             +-------+---------------+----------------+ Right ABIs appear essentially unchanged. Left ABIs appear essentially unchanged. Right Doppler Findings: +-----------+--------+-----+---------+-----------------------+ Site       PressureIndexDoppler  Comments                +-----------+--------+-----+---------+-----------------------+ Brachial                triphasicrestricted (cath today) +-----------+--------+-----+---------+-----------------------+ Radial                  triphasic                         +-----------+--------+-----+---------+-----------------------+ Ulnar                   triphasic                        +-----------+--------+-----+---------+-----------------------+ Palmar Arch                      radial cath today       +-----------+--------+-----+---------+-----------------------+  Left Doppler Findings: +--------+--------+-----+---------+--------+ Site    PressureIndexDoppler  Comments +--------+--------+-----+---------+--------+ [id number]          triphasic         +--------+--------+-----+---------+--------+ Radial               triphasic         +--------+--------+-----+---------+--------+ Ulnar                triphasic         +--------+--------+-----+---------+--------+  Summary: Right Carotid: Velocities in the right ICA are consistent with a 40-59%                stenosis. Left Carotid: Velocities in the left ICA are consistent with a 1-39% stenosis. Vertebrals:  Bilateral vertebral arteries demonstrate antegrade flow. Subclavians: Normal flow hemodynamics were seen in bilateral subclavian              arteries. Right ABI: Resting right ankle-brachial index is within normal range. No evidence of significant right lower extremity arterial disease. Left ABI: Resting left ankle-brachial index is within normal range. No evidence of significant left lower extremity arterial disease. Left Upper Extremity: Doppler waveforms decrease >50% with left radial compression. Doppler waveform obliterate with left ulnar compression.     Preliminary     Cardiac Studies   Echo 08/26/19: 1. Left ventricular ejection fraction, by estimation, is 40 to 45%. The  left ventricle has mildly decreased function. The left ventricle  demonstrates regional wall motion abnormalities (see scoring  diagram/findings for description). Left ventricular  diastolic parameters are consistent with Grade II diastolic dysfunction  (pseudonormalization). There is moderate  hypokinesis of the left  ventricular, mid-apical anteroseptal wall.  2. Right ventricular systolic function is normal. The right ventricular  size is normal.  3. The mitral valve is normal in structure. Mild mitral valve  regurgitation. No evidence of mitral stenosis.  4. The aortic valve is normal in structure. Aortic valve regurgitation is  not visualized. No aortic stenosis is present.   LHC  08/26/19: Diagnostic Dominance: Right   Conclusions: 1. Severe multivessel coronary artery disease, including 90% distal LMCA stenosis, as well as 70-80% lesions involving the mid LAD, D1, large OM1, and rPL branches. 2. Mildly to moderately reduced left ventricular contraction (LVEF ~45%) with anterior hypokinesis.  Upper normal left ventricular filling pressure.  Carotid Doppler 08/26/19: Right Carotid: Velocities in the right ICA are consistent with a 40-59%         stenosis.   Left Carotid: Velocities in the left ICA are consistent with a 1-39%  stenosis.  Vertebrals: Bilateral vertebral arteries demonstrate antegrade flow.  Subclavians: Normal flow hemodynamics were seen in bilateral subclavian        arteries.  ABIs: Right ABI: Resting right ankle-brachial index is within normal range. No  evidence of significant right lower extremity arterial disease.  Left ABI: Resting left ankle-brachial index is within normal range. No  evidence of significant left lower extremity arterial disease.  Left Upper Extremity: Doppler waveforms decrease >50% with left radial  compression. Doppler waveform obliterate with left ulnar compression.    Patient Profile     54 y.o. male with hypertension, hyperlipidemia, asthma, EtoH abuse, diabetes, and GERD admitted with NSTEMI.  He was found to have multivessel CAD at cath.  Assessment & Plan    # NSTEMI: # Hyperlipidemia:  Mr. Ginsburg presented with NSTEMI.  LHC showed multivessel CAD.  Planning for CABG tomorrow.  HS-troponin  peaked at 14,441 and now downtrending.  Mild systolic dysfunction on echo.  Lipids well-controlled.  LDL 58 this admission.  Continue rosuvastatin.  # Carotid stenosis:  Mild on L, moderate on R.  Aspirin and statin as above.  # Acute systolic and diastolic heart failure:  LVEF 40-45% on echo this admission.  He is euvolemic on exam.  #DM: Longstanding history of DM.  Hemoglobin A1c 10.8%.   He notes that his blood sugars have been well-controlled at home.  Holding home Metformin in preparation for cath.  Continue home 70/30 insulin at 40 units twice daily plus sliding scale.  Given systolic dysfunction, consider SGLT2 inhibitor prior to discharge.   # EtOH abuse:  Discussed the importance of limiting to 2/setting and 14/week.  No h/o DT.  Last drink was last weekend.  Lipase normal this AM.  # Hypertension:  BP currently low.  Home losartan/HCTZ on hold.   For questions or updates, please contact [place] Please consult [email or web address] for contact info under        Signed, Skeet Latch, MD  08/26/2019, 5:19 PM

## 2019-08-26 NOTE — Progress Notes (Signed)
VASCULAR LAB PRELIMINARY  PRELIMINARY  PRELIMINARY  PRELIMINARY  Pre CABG Dopplers  completed.    Preliminary report:  See CV proc for preliminary results.  KANADY, CANDACE, RVT 08/26/2019, 2:32 PM

## 2019-08-26 NOTE — Progress Notes (Signed)
ANTICOAGULATION CONSULT NOTE  Pharmacy Consult for Heparin  Indication: chest pain/ACS  No Known Allergies  Patient Measurements: Height: 5\' 10"  (177.8 cm) Weight: 97.3 kg (214 lb 6.4 oz) IBW/kg (Calculated) : 73 Heparin Dosing Weight: 93.9 kg  Vital Signs: Temp: 98.7 F (37.1 C) (05/24 0743) Temp Source: Oral (05/24 0743) BP: 115/76 (05/24 0743) Pulse Rate: 64 (05/24 0743)  Labs: Recent Labs    08/25/19 0810 08/25/19 0810 08/25/19 1010 08/25/19 1408 08/25/19 1537 08/26/19 0041 08/26/19 0749  HGB 16.4  --   --   --   --   --  13.8  HCT 45.7  --   --   --   --   --  39.9  PLT 225  --   --   --   --   --  157  HEPARINUNFRC  --   --   --   --  0.25* 0.36 0.49  CREATININE 1.01  --   --   --   --   --  0.88  TROPONINIHS 7,922*   < > 13,766* 14,441* 12,219*  --   --    < > = values in this interval not displayed.    Estimated Creatinine Clearance: 109.6 mL/min (by C-G formula based on SCr of 0.88 mg/dL).  Assessment: 58 YOM who presented to [facility] on 5/23 with CP and concern for ACS/NSTEMI.  Heparin level at goal on 1400 units/hr. No s/sx of bleeding or infusion issues per nursing. Planning for [facility] today.  Goal of Therapy:  Heparin level 0.3-0.7 units/ml Monitor platelets by anticoagulation protocol: Yes   Plan:  Continue heparin infusion at 1400 units/hr Monitor heparin level, CBC, and s/sx of bleeding  F/u plans for heparin after cath today.  Marguerite Olea, [facility] Clinical Pharmacist Phone [phone]  08/26/2019 8:52 AM

## 2019-08-26 NOTE — Progress Notes (Signed)
Inpatient Diabetes Program Recommendations  AACE/ADA: New Consensus Statement on Inpatient Glycemic Control (2015)  Target Ranges:  Prepandial:   less than 140 mg/dL      Peak postprandial:   less than 180 mg/dL (1-2 hours)      Critically ill patients:  140 - 180 mg/dL   Lab Results  Component Value Date   GLUCAP 186 (H) 08/26/2019   HGBA1C 10.8 (H) 08/25/2019    Review of Glycemic Control  Diabetes history: DM 2 Outpatient Diabetes medications: 70/30 40 units qam, 20 units qpm, metformin 1000 mg bid Current orders for Inpatient glycemic control:  70/30 40 units qam, 20 units qpm Novolog 0-15 units tid + hs  A1c 10.8%  Spoke with pt regarding A1c level of 10.8% improved from PCP visit  12.2% on 2/26. Pt reports not taking metformin regularly due to side effects. Pt does not check glucose at home.  Discussed importance of glucose control on heart and vascular health. Discussed glucose and A1c goals. Discussed other medication options with pt such as SGLT-2 in the future post surgery. Encouraged pt to speak with [facility] in addition to cardiologist regarding this medication. Should be able to use copay card. Discussed in detail diet choices. Pt was managed by PCP outpt for DM.   Pt to tell nursing staff when he would like to see our team again prior to d/c.  Thanks,  Tama Headings RN, MSN, BC-ADM Inpatient Diabetes Coordinator Team Pager [phone] (8a-5p)

## 2019-08-26 NOTE — Progress Notes (Signed)
RT NOTES: ABG obtained and sent to lab. Lab tech notified.  

## 2019-08-27 ENCOUNTER — Inpatient Hospital Stay (HOSPITAL_COMMUNITY): Payer: Managed Care, Other (non HMO)

## 2019-08-27 ENCOUNTER — Inpatient Hospital Stay (HOSPITAL_COMMUNITY)
Admission: EM | Disposition: A | Discharge status: Home / Self Care | Payer: Self-pay | Source: Home / Self Care | Attending: Surgery

## 2019-08-27 ENCOUNTER — Inpatient Hospital Stay (HOSPITAL_COMMUNITY): Payer: Managed Care, Other (non HMO) | Admitting: Anesthesiology

## 2019-08-27 DIAGNOSIS — I2510 Atherosclerotic heart disease of native coronary artery without angina pectoris: Secondary | ICD-10-CM | POA: Diagnosis present

## 2019-08-27 DIAGNOSIS — Z951 Presence of aortocoronary bypass graft: Secondary | ICD-10-CM

## 2019-08-27 DIAGNOSIS — I25110 Atherosclerotic heart disease of native coronary artery with unstable angina pectoris: Secondary | ICD-10-CM

## 2019-08-27 HISTORY — PX: ENDOVEIN HARVEST OF GREATER SAPHENOUS VEIN: SHX5059

## 2019-08-27 HISTORY — PX: TEE WITHOUT CARDIOVERSION: SHX5443

## 2019-08-27 HISTORY — PX: CORONARY ARTERY BYPASS GRAFT: SHX141

## 2019-08-27 LAB — POCT I-STAT 7, (LYTES, BLD GAS, ICA,H+H)
Acid-Base Excess: 1 mmol/L (ref 0.0–2.0)
Acid-Base Excess: 2 mmol/L (ref 0.0–2.0)
Acid-Base Excess: 0 mmol/L (ref 0.0–2.0)
Acid-Base Excess: 3 mmol/L — ABNORMAL HIGH (ref 0.0–2.0)
Acid-Base Excess: 0 mmol/L (ref 0.0–2.0)
Acid-base deficit: 1 mmol/L (ref 0.0–2.0)
Acid-base deficit: 3 mmol/L — ABNORMAL HIGH (ref 0.0–2.0)
Acid-base deficit: 2 mmol/L (ref 0.0–2.0)
Bicarbonate: 26.4 mmol/L (ref 20.0–28.0)
Bicarbonate: 26.7 mmol/L (ref 20.0–28.0)
Bicarbonate: 27.2 mmol/L (ref 20.0–28.0)
Bicarbonate: 24.3 mmol/L (ref 20.0–28.0)
Bicarbonate: 27.7 mmol/L (ref 20.0–28.0)
Bicarbonate: 21.5 mmol/L (ref 20.0–28.0)
Bicarbonate: 23.8 mmol/L (ref 20.0–28.0)
Bicarbonate: 26.3 mmol/L (ref 20.0–28.0)
Calcium, Ion: 1.26 mmol/L (ref 1.15–1.40)
Calcium, Ion: 0.99 mmol/L — ABNORMAL LOW (ref 1.15–1.40)
Calcium, Ion: 1.09 mmol/L — ABNORMAL LOW (ref 1.15–1.40)
Calcium, Ion: 1.1 mmol/L — ABNORMAL LOW (ref 1.15–1.40)
Calcium, Ion: 1.13 mmol/L — ABNORMAL LOW (ref 1.15–1.40)
Calcium, Ion: 1.09 mmol/L — ABNORMAL LOW (ref 1.15–1.40)
Calcium, Ion: 1.19 mmol/L (ref 1.15–1.40)
Calcium, Ion: 1.16 mmol/L (ref 1.15–1.40)
HCT: 37 % — ABNORMAL LOW (ref 39.0–52.0)
HCT: 28 % — ABNORMAL LOW (ref 39.0–52.0)
HCT: 26 % — ABNORMAL LOW (ref 39.0–52.0)
HCT: 28 % — ABNORMAL LOW (ref 39.0–52.0)
HCT: 28 % — ABNORMAL LOW (ref 39.0–52.0)
HCT: 28 % — ABNORMAL LOW (ref 39.0–52.0)
HCT: 32 % — ABNORMAL LOW (ref 39.0–52.0)
HCT: 30 % — ABNORMAL LOW (ref 39.0–52.0)
Hemoglobin: 12.6 g/dL — ABNORMAL LOW (ref 13.0–17.0)
Hemoglobin: 9.5 g/dL — ABNORMAL LOW (ref 13.0–17.0)
Hemoglobin: 8.8 g/dL — ABNORMAL LOW (ref 13.0–17.0)
Hemoglobin: 9.5 g/dL — ABNORMAL LOW (ref 13.0–17.0)
Hemoglobin: 9.5 g/dL — ABNORMAL LOW (ref 13.0–17.0)
Hemoglobin: 9.5 g/dL — ABNORMAL LOW (ref 13.0–17.0)
Hemoglobin: 10.9 g/dL — ABNORMAL LOW (ref 13.0–17.0)
Hemoglobin: 10.2 g/dL — ABNORMAL LOW (ref 13.0–17.0)
O2 Saturation: 100 %
O2 Saturation: 100 %
O2 Saturation: 100 %
O2 Saturation: 100 %
O2 Saturation: 95 %
O2 Saturation: 98 %
O2 Saturation: 98 %
O2 Saturation: 98 %
Patient temperature: 36.1
Patient temperature: 37.4
Patient temperature: 37.7
Potassium: 3.8 mmol/L (ref 3.5–5.1)
Potassium: 3.8 mmol/L (ref 3.5–5.1)
Potassium: 4.4 mmol/L (ref 3.5–5.1)
Potassium: 4.2 mmol/L (ref 3.5–5.1)
Potassium: 5 mmol/L (ref 3.5–5.1)
Potassium: 3.3 mmol/L — ABNORMAL LOW (ref 3.5–5.1)
Potassium: 4.5 mmol/L (ref 3.5–5.1)
Potassium: 5 mmol/L (ref 3.5–5.1)
Sodium: 138 mmol/L (ref 135–145)
Sodium: 143 mmol/L (ref 135–145)
Sodium: 141 mmol/L (ref 135–145)
Sodium: 142 mmol/L (ref 135–145)
Sodium: 144 mmol/L (ref 135–145)
Sodium: 147 mmol/L — ABNORMAL HIGH (ref 135–145)
Sodium: 144 mmol/L (ref 135–145)
Sodium: 144 mmol/L (ref 135–145)
TCO2: 28 mmol/L (ref 22–32)
TCO2: 28 mmol/L (ref 22–32)
TCO2: 28 mmol/L (ref 22–32)
TCO2: 25 mmol/L (ref 22–32)
TCO2: 29 mmol/L (ref 22–32)
TCO2: 23 mmol/L (ref 22–32)
TCO2: 25 mmol/L (ref 22–32)
TCO2: 28 mmol/L (ref 22–32)
pCO2 arterial: 53.3 mmHg — ABNORMAL HIGH (ref 32.0–48.0)
pCO2 arterial: 43.9 mmHg (ref 32.0–48.0)
pCO2 arterial: 42.3 mmHg (ref 32.0–48.0)
pCO2 arterial: 36.3 mmHg (ref 32.0–48.0)
pCO2 arterial: 44.8 mmHg (ref 32.0–48.0)
pCO2 arterial: 33.2 mmHg (ref 32.0–48.0)
pCO2 arterial: 45.3 mmHg (ref 32.0–48.0)
pCO2 arterial: 49.3 mmHg — ABNORMAL HIGH (ref 32.0–48.0)
pH, Arterial: 7.302 — ABNORMAL LOW (ref 7.350–7.450)
pH, Arterial: 7.392 (ref 7.350–7.450)
pH, Arterial: 7.415 (ref 7.350–7.450)
pH, Arterial: 7.399 (ref 7.350–7.450)
pH, Arterial: 7.434 (ref 7.350–7.450)
pH, Arterial: 7.416 (ref 7.350–7.450)
pH, Arterial: 7.329 — ABNORMAL LOW (ref 7.350–7.450)
pH, Arterial: 7.338 — ABNORMAL LOW (ref 7.350–7.450)
pO2, Arterial: 207 mmHg — ABNORMAL HIGH (ref 83.0–108.0)
pO2, Arterial: 322 mmHg — ABNORMAL HIGH (ref 83.0–108.0)
pO2, Arterial: 373 mmHg — ABNORMAL HIGH (ref 83.0–108.0)
pO2, Arterial: 358 mmHg — ABNORMAL HIGH (ref 83.0–108.0)
pO2, Arterial: 74 mmHg — ABNORMAL LOW (ref 83.0–108.0)
pO2, Arterial: 104 mmHg (ref 83.0–108.0)
pO2, Arterial: 124 mmHg — ABNORMAL HIGH (ref 83.0–108.0)
pO2, Arterial: 114 mmHg — ABNORMAL HIGH (ref 83.0–108.0)

## 2019-08-27 LAB — BASIC METABOLIC PANEL
Anion gap: 6 (ref 5–15)
BUN: 15 mg/dL (ref 6–20)
CO2: 23 mmol/L (ref 22–32)
Calcium: 8 mg/dL — ABNORMAL LOW (ref 8.9–10.3)
Chloride: 113 mmol/L — ABNORMAL HIGH (ref 98–111)
Creatinine, Ser: 0.85 mg/dL (ref 0.61–1.24)
GFR calc Af Amer: >60 mL/min (ref >60)
GFR calc non Af Amer: >60 mL/min (ref >60)
Glucose, Bld: 138 mg/dL — ABNORMAL HIGH (ref 70–99)
Potassium: 5 mmol/L (ref 3.5–5.1)
Sodium: 142 mmol/L (ref 135–145)

## 2019-08-27 LAB — POCT I-STAT, CHEM 8
BUN: 19 mg/dL (ref 6–20)
BUN: 14 mg/dL (ref 6–20)
BUN: 17 mg/dL (ref 6–20)
BUN: 15 mg/dL (ref 6–20)
BUN: 15 mg/dL (ref 6–20)
BUN: 15 mg/dL (ref 6–20)
BUN: 15 mg/dL (ref 6–20)
Calcium, Ion: 1.17 mmol/L (ref 1.15–1.40)
Calcium, Ion: 1 mmol/L — ABNORMAL LOW (ref 1.15–1.40)
Calcium, Ion: 1.23 mmol/L (ref 1.15–1.40)
Calcium, Ion: 1.14 mmol/L — ABNORMAL LOW (ref 1.15–1.40)
Calcium, Ion: 1.12 mmol/L — ABNORMAL LOW (ref 1.15–1.40)
Calcium, Ion: 1.08 mmol/L — ABNORMAL LOW (ref 1.15–1.40)
Calcium, Ion: 1.12 mmol/L — ABNORMAL LOW (ref 1.15–1.40)
Chloride: 101 mmol/L (ref 98–111)
Chloride: 102 mmol/L (ref 98–111)
Chloride: 104 mmol/L (ref 98–111)
Chloride: 103 mmol/L (ref 98–111)
Chloride: 106 mmol/L (ref 98–111)
Chloride: 106 mmol/L (ref 98–111)
Chloride: 107 mmol/L (ref 98–111)
Creatinine, Ser: 0.7 mg/dL (ref 0.61–1.24)
Creatinine, Ser: 0.5 mg/dL — ABNORMAL LOW (ref 0.61–1.24)
Creatinine, Ser: 0.7 mg/dL (ref 0.61–1.24)
Creatinine, Ser: 0.6 mg/dL — ABNORMAL LOW (ref 0.61–1.24)
Creatinine, Ser: 0.6 mg/dL — ABNORMAL LOW (ref 0.61–1.24)
Creatinine, Ser: 0.5 mg/dL — ABNORMAL LOW (ref 0.61–1.24)
Creatinine, Ser: 0.6 mg/dL — ABNORMAL LOW (ref 0.61–1.24)
Glucose, Bld: 229 mg/dL — ABNORMAL HIGH (ref 70–99)
Glucose, Bld: 141 mg/dL — ABNORMAL HIGH (ref 70–99)
Glucose, Bld: 200 mg/dL — ABNORMAL HIGH (ref 70–99)
Glucose, Bld: 141 mg/dL — ABNORMAL HIGH (ref 70–99)
Glucose, Bld: 117 mg/dL — ABNORMAL HIGH (ref 70–99)
Glucose, Bld: 120 mg/dL — ABNORMAL HIGH (ref 70–99)
Glucose, Bld: 125 mg/dL — ABNORMAL HIGH (ref 70–99)
HCT: 38 % — ABNORMAL LOW (ref 39.0–52.0)
HCT: 30 % — ABNORMAL LOW (ref 39.0–52.0)
HCT: 36 % — ABNORMAL LOW (ref 39.0–52.0)
HCT: 28 % — ABNORMAL LOW (ref 39.0–52.0)
HCT: 29 % — ABNORMAL LOW (ref 39.0–52.0)
HCT: 30 % — ABNORMAL LOW (ref 39.0–52.0)
HCT: 30 % — ABNORMAL LOW (ref 39.0–52.0)
Hemoglobin: 12.9 g/dL — ABNORMAL LOW (ref 13.0–17.0)
Hemoglobin: 10.2 g/dL — ABNORMAL LOW (ref 13.0–17.0)
Hemoglobin: 12.2 g/dL — ABNORMAL LOW (ref 13.0–17.0)
Hemoglobin: 9.5 g/dL — ABNORMAL LOW (ref 13.0–17.0)
Hemoglobin: 9.9 g/dL — ABNORMAL LOW (ref 13.0–17.0)
Hemoglobin: 10.2 g/dL — ABNORMAL LOW (ref 13.0–17.0)
Hemoglobin: 10.2 g/dL — ABNORMAL LOW (ref 13.0–17.0)
Potassium: 3.8 mmol/L (ref 3.5–5.1)
Potassium: 3.7 mmol/L (ref 3.5–5.1)
Potassium: 3.8 mmol/L (ref 3.5–5.1)
Potassium: 4.1 mmol/L (ref 3.5–5.1)
Potassium: 4.7 mmol/L (ref 3.5–5.1)
Potassium: 4.2 mmol/L (ref 3.5–5.1)
Potassium: 4.9 mmol/L (ref 3.5–5.1)
Sodium: 137 mmol/L (ref 135–145)
Sodium: 139 mmol/L (ref 135–145)
Sodium: 141 mmol/L (ref 135–145)
Sodium: 140 mmol/L (ref 135–145)
Sodium: 142 mmol/L (ref 135–145)
Sodium: 140 mmol/L (ref 135–145)
Sodium: 143 mmol/L (ref 135–145)
TCO2: 27 mmol/L (ref 22–32)
TCO2: 27 mmol/L (ref 22–32)
TCO2: 27 mmol/L (ref 22–32)
TCO2: 30 mmol/L (ref 22–32)
TCO2: 27 mmol/L (ref 22–32)
TCO2: 25 mmol/L (ref 22–32)
TCO2: 28 mmol/L (ref 22–32)

## 2019-08-27 LAB — PROTIME-INR
INR: 1.2 (ref 0.8–1.2)
Prothrombin Time: 14.9 seconds (ref 11.4–15.2)

## 2019-08-27 LAB — GLUCOSE, CAPILLARY
Glucose-Capillary: 101 mg/dL — ABNORMAL HIGH (ref 70–99)
Glucose-Capillary: 105 mg/dL — ABNORMAL HIGH (ref 70–99)
Glucose-Capillary: 113 mg/dL — ABNORMAL HIGH (ref 70–99)
Glucose-Capillary: 123 mg/dL — ABNORMAL HIGH (ref 70–99)
Glucose-Capillary: 134 mg/dL — ABNORMAL HIGH (ref 70–99)
Glucose-Capillary: 134 mg/dL — ABNORMAL HIGH (ref 70–99)
Glucose-Capillary: 150 mg/dL — ABNORMAL HIGH (ref 70–99)
Glucose-Capillary: 223 mg/dL — ABNORMAL HIGH (ref 70–99)
Glucose-Capillary: 90 mg/dL (ref 70–99)

## 2019-08-27 LAB — CBC
HCT: 32.6 % — ABNORMAL LOW (ref 39.0–52.0)
HCT: 32.5 % — ABNORMAL LOW (ref 39.0–52.0)
Hemoglobin: 11.3 g/dL — ABNORMAL LOW (ref 13.0–17.0)
Hemoglobin: 11 g/dL — ABNORMAL LOW (ref 13.0–17.0)
MCH: 32.5 pg (ref 26.0–34.0)
MCH: 32.4 pg (ref 26.0–34.0)
MCHC: 34.7 g/dL (ref 30.0–36.0)
MCHC: 33.8 g/dL (ref 30.0–36.0)
MCV: 93.7 fL (ref 80.0–100.0)
MCV: 95.6 fL (ref 80.0–100.0)
Platelets: 113 10*3/uL — ABNORMAL LOW (ref 150–400)
Platelets: 137 10*3/uL — ABNORMAL LOW (ref 150–400)
RBC: 3.48 MIL/uL — ABNORMAL LOW (ref 4.22–5.81)
RBC: 3.4 MIL/uL — ABNORMAL LOW (ref 4.22–5.81)
RDW: 11.9 % (ref 11.5–15.5)
RDW: 12.2 % (ref 11.5–15.5)
WBC: 7.1 10*3/uL (ref 4.0–10.5)
WBC: 11.2 10*3/uL — ABNORMAL HIGH (ref 4.0–10.5)
nRBC: 0 % (ref 0.0–0.2)
nRBC: 0 % (ref 0.0–0.2)

## 2019-08-27 LAB — HEMOGLOBIN AND HEMATOCRIT, BLOOD
HCT: 29.1 % — ABNORMAL LOW (ref 39.0–52.0)
Hemoglobin: 10.1 g/dL — ABNORMAL LOW (ref 13.0–17.0)

## 2019-08-27 LAB — ECHO INTRAOPERATIVE TEE
Height: 70 in
Weight: 3468.8 oz

## 2019-08-27 LAB — APTT: aPTT: 30 seconds (ref 24–36)

## 2019-08-27 LAB — PLATELET COUNT: Platelets: 118 10*3/uL — ABNORMAL LOW (ref 150–400)

## 2019-08-27 LAB — MAGNESIUM: Magnesium: 2.8 mg/dL — ABNORMAL HIGH (ref 1.7–2.4)

## 2019-08-27 SURGERY — CORONARY ARTERY BYPASS GRAFTING (CABG)
Anesthesia: General | Site: Chest | Laterality: Right

## 2019-08-27 MED ORDER — LACTATED RINGERS IV SOLN: INTRAVENOUS | Status: DC

## 2019-08-27 MED ORDER — PHENYLEPHRINE 40 MCG/ML (10ML) SYRINGE FOR IV PUSH (FOR BLOOD PRESSURE SUPPORT)
PREFILLED_SYRINGE | INTRAVENOUS | Status: DC | PRN
  Administered 2019-08-27 (×2): 120 ug via INTRAVENOUS

## 2019-08-27 MED ORDER — CHLORHEXIDINE GLUCONATE 0.12 % MT SOLN
15.0000 mL | OROMUCOSAL | Status: AC
  Administered 2019-08-27: 15 mL via OROMUCOSAL

## 2019-08-27 MED ORDER — FENTANYL CITRATE (PF) 250 MCG/5ML IJ SOLN
INTRAMUSCULAR | Status: AC
  Filled 2019-08-27: qty 30

## 2019-08-27 MED ORDER — SODIUM CHLORIDE 0.45 % IV SOLN: INTRAVENOUS | Status: DC | PRN

## 2019-08-27 MED ORDER — CHLORHEXIDINE GLUCONATE CLOTH 2 % EX PADS
6.0000 | MEDICATED_PAD | Freq: Every day | CUTANEOUS | Status: DC
  Administered 2019-08-28: 6 via TOPICAL

## 2019-08-27 MED ORDER — PROPOFOL 10 MG/ML IV BOLUS
INTRAVENOUS | Status: AC
  Filled 2019-08-27: qty 20

## 2019-08-27 MED ORDER — ROCURONIUM BROMIDE 100 MG/10ML IV SOLN
INTRAVENOUS | Status: DC | PRN
  Administered 2019-08-27: 100 mg via INTRAVENOUS
  Administered 2019-08-27: 70 mg via INTRAVENOUS
  Administered 2019-08-27: 30 mg via INTRAVENOUS

## 2019-08-27 MED ORDER — METOPROLOL TARTRATE 25 MG/10 ML ORAL SUSPENSION: 12.5000 mg | Freq: Two times a day (BID) | ORAL | Status: DC

## 2019-08-27 MED ORDER — PANTOPRAZOLE SODIUM 40 MG PO TBEC: 40.0000 mg | DELAYED_RELEASE_TABLET | Freq: Every day | ORAL | Status: DC

## 2019-08-27 MED ORDER — PROPOFOL 10 MG/ML IV BOLUS
INTRAVENOUS | Status: DC | PRN
  Administered 2019-08-27: 90 mg via INTRAVENOUS

## 2019-08-27 MED ORDER — LACTATED RINGERS IV SOLN: 500.0000 mL | Freq: Once | INTRAVENOUS | Status: DC | PRN

## 2019-08-27 MED ORDER — DOCUSATE SODIUM 100 MG PO CAPS
200.0000 mg | ORAL_CAPSULE | Freq: Every day | ORAL | Status: DC
  Administered 2019-08-28: 200 mg via ORAL
  Filled 2019-08-27: qty 2

## 2019-08-27 MED ORDER — SODIUM CHLORIDE 0.9 % IV SOLN: 250.0000 mL | INTRAVENOUS | Status: DC

## 2019-08-27 MED ORDER — ASPIRIN EC 325 MG PO TBEC
325.0000 mg | DELAYED_RELEASE_TABLET | Freq: Every day | ORAL | Status: DC
  Administered 2019-08-28: 325 mg via ORAL
  Filled 2019-08-27: qty 1

## 2019-08-27 MED ORDER — LACTATED RINGERS IV SOLN: INTRAVENOUS | Status: DC | PRN

## 2019-08-27 MED ORDER — STERILE WATER FOR IRRIGATION IR SOLN
Status: DC | PRN
  Administered 2019-08-27: 2000 mL

## 2019-08-27 MED ORDER — MORPHINE SULFATE (PF) 2 MG/ML IV SOLN
1.0000 mg | INTRAVENOUS | Status: DC | PRN
  Administered 2019-08-27 – 2019-08-28 (×3): 2 mg via INTRAVENOUS
  Filled 2019-08-27 (×3): qty 1

## 2019-08-27 MED ORDER — HEMOSTATIC AGENTS (NO CHARGE) OPTIME
TOPICAL | Status: DC | PRN
  Administered 2019-08-27: 1 via TOPICAL

## 2019-08-27 MED ORDER — ACETAMINOPHEN 500 MG PO TABS
1000.0000 mg | ORAL_TABLET | Freq: Four times a day (QID) | ORAL | Status: DC
  Administered 2019-08-28 – 2019-08-29 (×5): 1000 mg via ORAL
  Filled 2019-08-27 (×6): qty 2

## 2019-08-27 MED ORDER — PLASMA-LYTE 148 IV SOLN
INTRAVENOUS | Status: DC | PRN
  Administered 2019-08-27: 500 mL via INTRAVASCULAR

## 2019-08-27 MED ORDER — INSULIN REGULAR(HUMAN) IN NACL 100-0.9 UT/100ML-% IV SOLN: INTRAVENOUS | Status: DC

## 2019-08-27 MED ORDER — MIDAZOLAM HCL 2 MG/2ML IJ SOLN: 2.0000 mg | INTRAMUSCULAR | Status: DC | PRN

## 2019-08-27 MED ORDER — ACETAMINOPHEN 160 MG/5ML PO SOLN: 650.0000 mg | Freq: Once | ORAL | Status: AC

## 2019-08-27 MED ORDER — VANCOMYCIN HCL IN DEXTROSE 1-5 GM/200ML-% IV SOLN
1000.0000 mg | Freq: Once | INTRAVENOUS | Status: AC
  Administered 2019-08-27: 1000 mg via INTRAVENOUS
  Filled 2019-08-27: qty 200

## 2019-08-27 MED ORDER — ALBUMIN HUMAN 5 % IV SOLN
250.0000 mL | INTRAVENOUS | Status: AC | PRN
  Administered 2019-08-27 (×4): 12.5 g via INTRAVENOUS
  Filled 2019-08-27 (×2): qty 250

## 2019-08-27 MED ORDER — ARTIFICIAL TEARS OPHTHALMIC OINT
TOPICAL_OINTMENT | OPHTHALMIC | Status: DC | PRN
  Administered 2019-08-27: 1 via OPHTHALMIC

## 2019-08-27 MED ORDER — DEXTROSE 50 % IV SOLN: 0.0000 mL | INTRAVENOUS | Status: DC | PRN

## 2019-08-27 MED ORDER — SODIUM CHLORIDE 0.9 % IV SOLN: INTRAVENOUS | Status: DC

## 2019-08-27 MED ORDER — EPHEDRINE SULFATE-NACL 50-0.9 MG/10ML-% IV SOSY
PREFILLED_SYRINGE | INTRAVENOUS | Status: DC | PRN
  Administered 2019-08-27: 10 mg via INTRAVENOUS

## 2019-08-27 MED ORDER — POTASSIUM CHLORIDE 10 MEQ/50ML IV SOLN
10.0000 meq | INTRAVENOUS | Status: AC
  Administered 2019-08-27 (×3): 10 meq via INTRAVENOUS

## 2019-08-27 MED ORDER — THROMBIN (RECOMBINANT) 20000 UNITS EX SOLR
CUTANEOUS | Status: AC
  Filled 2019-08-27: qty 20000

## 2019-08-27 MED ORDER — TRAMADOL HCL 50 MG PO TABS
50.0000 mg | ORAL_TABLET | ORAL | Status: DC | PRN
  Administered 2019-08-27 – 2019-08-29 (×3): 50 mg via ORAL
  Filled 2019-08-27 (×3): qty 1

## 2019-08-27 MED ORDER — ALBUMIN HUMAN 5 % IV SOLN: INTRAVENOUS | Status: DC | PRN

## 2019-08-27 MED ORDER — ARTIFICIAL TEARS OPHTHALMIC OINT
TOPICAL_OINTMENT | OPHTHALMIC | Status: AC
  Filled 2019-08-27: qty 3.5

## 2019-08-27 MED ORDER — ORAL CARE MOUTH RINSE
15.0000 mL | Freq: Two times a day (BID) | OROMUCOSAL | Status: DC
  Administered 2019-08-27 – 2019-08-28 (×3): 15 mL via OROMUCOSAL

## 2019-08-27 MED ORDER — NITROGLYCERIN IN D5W 200-5 MCG/ML-% IV SOLN: 0.0000 ug/min | INTRAVENOUS | Status: DC

## 2019-08-27 MED ORDER — ACETAMINOPHEN 160 MG/5ML PO SOLN: 1000.0000 mg | Freq: Four times a day (QID) | ORAL | Status: DC

## 2019-08-27 MED ORDER — ROCURONIUM BROMIDE 10 MG/ML (PF) SYRINGE
PREFILLED_SYRINGE | INTRAVENOUS | Status: AC
  Filled 2019-08-27: qty 10

## 2019-08-27 MED ORDER — PHENYLEPHRINE 40 MCG/ML (10ML) SYRINGE FOR IV PUSH (FOR BLOOD PRESSURE SUPPORT)
PREFILLED_SYRINGE | INTRAVENOUS | Status: AC
  Filled 2019-08-27: qty 10

## 2019-08-27 MED ORDER — SODIUM CHLORIDE 0.9% FLUSH
3.0000 mL | Freq: Two times a day (BID) | INTRAVENOUS | Status: DC
  Administered 2019-08-28 (×2): 3 mL via INTRAVENOUS

## 2019-08-27 MED ORDER — 0.9 % SODIUM CHLORIDE (POUR BTL) OPTIME
TOPICAL | Status: DC | PRN
  Administered 2019-08-27: 5000 mL

## 2019-08-27 MED ORDER — MAGNESIUM SULFATE 4 GM/100ML IV SOLN
4.0000 g | Freq: Once | INTRAVENOUS | Status: AC
  Administered 2019-08-27: 4 g via INTRAVENOUS

## 2019-08-27 MED ORDER — ASPIRIN 81 MG PO CHEW: 324.0000 mg | CHEWABLE_TABLET | Freq: Every day | ORAL | Status: DC

## 2019-08-27 MED ORDER — PROTAMINE SULFATE 10 MG/ML IV SOLN
INTRAVENOUS | Status: DC | PRN
  Administered 2019-08-27: 350 mg via INTRAVENOUS

## 2019-08-27 MED ORDER — BISACODYL 5 MG PO TBEC
10.0000 mg | DELAYED_RELEASE_TABLET | Freq: Every day | ORAL | Status: DC
  Administered 2019-08-28: 10 mg via ORAL
  Filled 2019-08-27: qty 2

## 2019-08-27 MED ORDER — MIDAZOLAM HCL 5 MG/5ML IJ SOLN
INTRAMUSCULAR | Status: DC | PRN
  Administered 2019-08-27: 2 mg via INTRAVENOUS
  Administered 2019-08-27 (×2): 4 mg via INTRAVENOUS

## 2019-08-27 MED ORDER — EPHEDRINE 5 MG/ML INJ
INTRAVENOUS | Status: AC
  Filled 2019-08-27: qty 10

## 2019-08-27 MED ORDER — SODIUM CHLORIDE 0.9% FLUSH
10.0000 mL | Freq: Two times a day (BID) | INTRAVENOUS | Status: DC
  Administered 2019-08-27 – 2019-08-28 (×3): 10 mL

## 2019-08-27 MED ORDER — PHENYLEPHRINE HCL-NACL 20-0.9 MG/250ML-% IV SOLN
0.0000 ug/min | INTRAVENOUS | Status: DC
  Administered 2019-08-27: 40 ug/min via INTRAVENOUS
  Filled 2019-08-27: qty 250

## 2019-08-27 MED ORDER — ORAL CARE MOUTH RINSE
15.0000 mL | OROMUCOSAL | Status: DC
  Administered 2019-08-27: 15 mL via OROMUCOSAL

## 2019-08-27 MED ORDER — SODIUM CHLORIDE 0.9 % IV SOLN
1.5000 g | Freq: Two times a day (BID) | INTRAVENOUS | Status: AC
  Administered 2019-08-27 – 2019-08-29 (×4): 1.5 g via INTRAVENOUS
  Filled 2019-08-27 (×4): qty 1.5

## 2019-08-27 MED ORDER — BISACODYL 10 MG RE SUPP: 10.0000 mg | Freq: Every day | RECTAL | Status: DC

## 2019-08-27 MED ORDER — METOPROLOL TARTRATE 12.5 MG HALF TABLET
12.5000 mg | ORAL_TABLET | Freq: Two times a day (BID) | ORAL | Status: DC
  Administered 2019-08-28 (×2): 12.5 mg via ORAL
  Filled 2019-08-27 (×2): qty 1

## 2019-08-27 MED ORDER — HEPARIN SODIUM (PORCINE) 1000 UNIT/ML IJ SOLN
INTRAMUSCULAR | Status: DC | PRN
  Administered 2019-08-27: 35000 [IU] via INTRAVENOUS
  Administered 2019-08-27: 5000 [IU] via INTRAVENOUS

## 2019-08-27 MED ORDER — THROMBIN 20000 UNITS EX SOLR
OROMUCOSAL | Status: DC | PRN
  Administered 2019-08-27 (×3): 4 mL via TOPICAL

## 2019-08-27 MED ORDER — METOPROLOL TARTRATE 5 MG/5ML IV SOLN: 2.5000 mg | INTRAVENOUS | Status: DC | PRN

## 2019-08-27 MED ORDER — SODIUM CHLORIDE 0.9% FLUSH: 10.0000 mL | INTRAVENOUS | Status: DC | PRN

## 2019-08-27 MED ORDER — FENTANYL CITRATE (PF) 250 MCG/5ML IJ SOLN
INTRAMUSCULAR | Status: DC | PRN
  Administered 2019-08-27: 150 ug via INTRAVENOUS
  Administered 2019-08-27 (×2): 100 ug via INTRAVENOUS
  Administered 2019-08-27: 50 ug via INTRAVENOUS
  Administered 2019-08-27 (×9): 100 ug via INTRAVENOUS

## 2019-08-27 MED ORDER — LIDOCAINE 2% (20 MG/ML) 5 ML SYRINGE
INTRAMUSCULAR | Status: AC
  Filled 2019-08-27: qty 5

## 2019-08-27 MED ORDER — CHLORHEXIDINE GLUCONATE 0.12% ORAL RINSE (MEDLINE KIT): 15.0000 mL | Freq: Two times a day (BID) | OROMUCOSAL | Status: DC

## 2019-08-27 MED ORDER — SODIUM CHLORIDE 0.9% FLUSH: 3.0000 mL | INTRAVENOUS | Status: DC | PRN

## 2019-08-27 MED ORDER — ACETAMINOPHEN 650 MG RE SUPP
650.0000 mg | Freq: Once | RECTAL | Status: AC
  Administered 2019-08-27: 650 mg via RECTAL

## 2019-08-27 MED ORDER — THROMBIN 20000 UNITS EX SOLR
CUTANEOUS | Status: DC | PRN
  Administered 2019-08-27: 20000 [IU] via TOPICAL

## 2019-08-27 MED ORDER — MIDAZOLAM HCL (PF) 10 MG/2ML IJ SOLN
INTRAMUSCULAR | Status: AC
  Filled 2019-08-27: qty 2

## 2019-08-27 MED ORDER — DEXMEDETOMIDINE HCL IN NACL 400 MCG/100ML IV SOLN: 0.0000 ug/kg/h | INTRAVENOUS | Status: DC

## 2019-08-27 MED ORDER — ROCURONIUM BROMIDE 10 MG/ML (PF) SYRINGE
PREFILLED_SYRINGE | INTRAVENOUS | Status: AC
  Filled 2019-08-27: qty 20

## 2019-08-27 MED ORDER — ONDANSETRON HCL 4 MG/2ML IJ SOLN
4.0000 mg | Freq: Four times a day (QID) | INTRAMUSCULAR | Status: DC | PRN
  Administered 2019-08-28: 4 mg via INTRAVENOUS
  Filled 2019-08-27: qty 2

## 2019-08-27 MED ORDER — FAMOTIDINE IN NACL 20-0.9 MG/50ML-% IV SOLN
20.0000 mg | Freq: Two times a day (BID) | INTRAVENOUS | Status: AC
  Administered 2019-08-27: 20 mg via INTRAVENOUS

## 2019-08-27 MED ORDER — OXYCODONE HCL 5 MG PO TABS
5.0000 mg | ORAL_TABLET | ORAL | Status: DC | PRN
  Administered 2019-08-28 (×4): 10 mg via ORAL
  Filled 2019-08-27 (×4): qty 2

## 2019-08-27 SURGICAL SUPPLY — 104 items
BAG DECANTER FOR FLEXI CONT (MISCELLANEOUS) ×5 IMPLANT
BLADE CLIPPER SURG (BLADE) ×5 IMPLANT
BLADE STERNUM SYSTEM 6 (BLADE) ×5 IMPLANT
BLADE SURG SZ11 CARB STEEL (BLADE) ×2 IMPLANT
BNDG ELASTIC 4X5.8 VLCR STR LF (GAUZE/BANDAGES/DRESSINGS) ×5 IMPLANT
BNDG ELASTIC 6X5.8 VLCR STR LF (GAUZE/BANDAGES/DRESSINGS) ×5 IMPLANT
BNDG GAUZE ELAST 4 BULKY (GAUZE/BANDAGES/DRESSINGS) ×5 IMPLANT
CANISTER SUCT 3000ML PPV (MISCELLANEOUS) ×5 IMPLANT
CATH ROBINSON RED A/P 18FR (CATHETERS) ×10 IMPLANT
CATH THORACIC 28FR (CATHETERS) ×7 IMPLANT
CATH THORACIC 36FR (CATHETERS) ×5 IMPLANT
CATH THORACIC 36FR RT ANG (CATHETERS) ×5 IMPLANT
CLIP RETRACTION 3.0MM CORONARY (MISCELLANEOUS) ×2 IMPLANT
CLIP VESOCCLUDE MED 24/CT (CLIP) IMPLANT
CLIP VESOCCLUDE SM WIDE 24/CT (CLIP) ×2 IMPLANT
DERMABOND ADVANCED (GAUZE/BANDAGES/DRESSINGS) ×2
DERMABOND ADVANCED .7 DNX12 (GAUZE/BANDAGES/DRESSINGS) IMPLANT
DRAPE CARDIOVASCULAR INCISE (DRAPES) ×5
DRAPE SLUSH/WARMER DISC (DRAPES) ×5 IMPLANT
DRAPE SRG 135X102X78XABS (DRAPES) ×3 IMPLANT
DRSG COVADERM 4X14 (GAUZE/BANDAGES/DRESSINGS) ×5 IMPLANT
ELECT CAUTERY BLADE 6.4 (BLADE) ×5 IMPLANT
ELECT REM PT RETURN 9FT ADLT (ELECTROSURGICAL) ×10
ELECTRODE REM PT RTRN 9FT ADLT (ELECTROSURGICAL) ×6 IMPLANT
FELT TEFLON 1X6 (MISCELLANEOUS) ×8 IMPLANT
GAUZE SPONGE 4X4 12PLY STRL (GAUZE/BANDAGES/DRESSINGS) ×10 IMPLANT
GLOVE BIO SURGEON STRL SZ 6 (GLOVE) IMPLANT
GLOVE BIO SURGEON STRL SZ 6.5 (GLOVE) ×1 IMPLANT
GLOVE BIO SURGEON STRL SZ7 (GLOVE) IMPLANT
GLOVE BIO SURGEON STRL SZ7.5 (GLOVE) IMPLANT
GLOVE BIO SURGEONS STRL SZ 6.5 (GLOVE) ×1
GLOVE BIOGEL PI IND STRL 6 (GLOVE) IMPLANT
GLOVE BIOGEL PI IND STRL 6.5 (GLOVE) IMPLANT
GLOVE BIOGEL PI IND STRL 7.0 (GLOVE) IMPLANT
GLOVE BIOGEL PI INDICATOR 6 (GLOVE)
GLOVE BIOGEL PI INDICATOR 6.5 (GLOVE) ×6
GLOVE BIOGEL PI INDICATOR 7.0 (GLOVE)
GLOVE ORTHO TXT STRL SZ7.5 (GLOVE) IMPLANT
GLOVE SS BIOGEL STRL SZ 7 (GLOVE) ×6 IMPLANT
GLOVE SUPERSENSE BIOGEL SZ 7 (GLOVE)
GLOVE TRIUMPH SURG SIZE 7.0 (KITS) ×4 IMPLANT
GOWN STRL REUS W/ TWL LRG LVL3 (GOWN DISPOSABLE) ×12 IMPLANT
GOWN STRL REUS W/ TWL XL LVL3 (GOWN DISPOSABLE) ×3 IMPLANT
GOWN STRL REUS W/TWL LRG LVL3 (GOWN DISPOSABLE) ×30
GOWN STRL REUS W/TWL XL LVL3 (GOWN DISPOSABLE) ×5
HEMOSTAT POWDER SURGIFOAM 1G (HEMOSTASIS) ×15 IMPLANT
HEMOSTAT SURGICEL 2X14 (HEMOSTASIS) ×5 IMPLANT
INSERT FOGARTY 61MM (MISCELLANEOUS) IMPLANT
INSERT FOGARTY XLG (MISCELLANEOUS) IMPLANT
KIT BASIN OR (CUSTOM PROCEDURE TRAY) ×5 IMPLANT
KIT CATH CPB BARTLE (MISCELLANEOUS) ×5 IMPLANT
KIT SUCTION CATH 14FR (SUCTIONS) ×7 IMPLANT
KIT TURNOVER KIT B (KITS) ×5 IMPLANT
KIT VASOVIEW HEMOPRO 2 VH 4000 (KITS) ×5 IMPLANT
NS IRRIG 1000ML POUR BTL (IV SOLUTION) ×25 IMPLANT
PACK E OPEN HEART (SUTURE) ×5 IMPLANT
PACK OPEN HEART (CUSTOM PROCEDURE TRAY) ×5 IMPLANT
PAD ARMBOARD 7.5X6 YLW CONV (MISCELLANEOUS) ×6 IMPLANT
PAD ELECT DEFIB RADIOL ZOLL (MISCELLANEOUS) ×5 IMPLANT
PENCIL BUTTON HOLSTER BLD 10FT (ELECTRODE) ×5 IMPLANT
POSITIONER HEAD DONUT 9IN (MISCELLANEOUS) ×5 IMPLANT
PUNCH AORTIC ROTATE 4.0MM (MISCELLANEOUS) IMPLANT
PUNCH AORTIC ROTATE 4.5MM 8IN (MISCELLANEOUS) ×5 IMPLANT
PUNCH AORTIC ROTATE 5MM 8IN (MISCELLANEOUS) IMPLANT
SET CARDIOPLEGIA MPS 5001102 (MISCELLANEOUS) ×2 IMPLANT
SPONGE INTESTINAL PEANUT (DISPOSABLE) IMPLANT
SPONGE LAP 18X18 RF (DISPOSABLE) ×6 IMPLANT
SPONGE LAP 4X18 RFD (DISPOSABLE) ×5 IMPLANT
SUPPORT HEART JANKE-BARRON (MISCELLANEOUS) ×5 IMPLANT
SUT BONE WAX W31G (SUTURE) ×5 IMPLANT
SUT MNCRL AB 4-0 PS2 18 (SUTURE) ×2 IMPLANT
SUT PROLENE 3 0 SH DA (SUTURE) IMPLANT
SUT PROLENE 3 0 SH1 36 (SUTURE) ×5 IMPLANT
SUT PROLENE 4 0 RB 1 (SUTURE)
SUT PROLENE 4 0 SH DA (SUTURE) IMPLANT
SUT PROLENE 4-0 RB1 .5 CRCL 36 (SUTURE) IMPLANT
SUT PROLENE 5 0 C 1 36 (SUTURE) IMPLANT
SUT PROLENE 6 0 C 1 30 (SUTURE) ×10 IMPLANT
SUT PROLENE 7 0 BV 1 (SUTURE) IMPLANT
SUT PROLENE 7 0 BV1 MDA (SUTURE) ×9 IMPLANT
SUT PROLENE 8 0 BV175 6 (SUTURE) ×2 IMPLANT
SUT SILK  1 MH (SUTURE) ×5
SUT SILK 1 MH (SUTURE) IMPLANT
SUT SILK 2 0 SH (SUTURE) IMPLANT
SUT STEEL STERNAL CCS#1 18IN (SUTURE) IMPLANT
SUT STEEL SZ 6 DBL 3X14 BALL (SUTURE) ×6 IMPLANT
SUT VIC AB 1 CTX 36 (SUTURE) ×10
SUT VIC AB 1 CTX36XBRD ANBCTR (SUTURE) ×6 IMPLANT
SUT VIC AB 2-0 CT1 27 (SUTURE) ×5
SUT VIC AB 2-0 CT1 TAPERPNT 27 (SUTURE) IMPLANT
SUT VIC AB 2-0 CTX 27 (SUTURE) IMPLANT
SUT VIC AB 3-0 SH 27 (SUTURE)
SUT VIC AB 3-0 SH 27X BRD (SUTURE) IMPLANT
SUT VIC AB 3-0 X1 27 (SUTURE) IMPLANT
SUT VICRYL 4-0 PS2 18IN ABS (SUTURE) IMPLANT
SYSTEM SAHARA CHEST DRAIN ATS (WOUND CARE) ×5 IMPLANT
TAPE CLOTH SURG 4X10 WHT LF (GAUZE/BANDAGES/DRESSINGS) ×4 IMPLANT
TAPE PAPER 3X10 WHT MICROPORE (GAUZE/BANDAGES/DRESSINGS) ×2 IMPLANT
TOWEL GREEN STERILE (TOWEL DISPOSABLE) ×5 IMPLANT
TOWEL GREEN STERILE FF (TOWEL DISPOSABLE) ×5 IMPLANT
TRAY FOLEY SLVR 16FR TEMP STAT (SET/KITS/TRAYS/PACK) ×5 IMPLANT
TUBING LAP HI FLOW INSUFFLATIO (TUBING) ×5 IMPLANT
UNDERPAD 30X36 HEAVY ABSORB (UNDERPADS AND DIAPERS) ×5 IMPLANT
WATER STERILE IRR 1000ML POUR (IV SOLUTION) ×10 IMPLANT

## 2019-08-27 NOTE — Progress Notes (Signed)
TCTS BRIEF SICU PROGRESS NOTE  Day of Surgery  S/P Procedure(s) (LRB): CORONARY ARTERY BYPASS GRAFTING (CABG) x7, LIMA TO LAD, SVG TO DIAG 1 WITH SEQUENTIAL SVG TO DIAG 2, SVG TO OM2, SVG TO PDA WITH SEQUENTIAL SVG TO PLB WITH SEQUENTIAL SVG TO OTHER. (N/A) TRANSESOPHAGEAL ECHOCARDIOGRAM (TEE) (N/A) Endovein Harvest Of Greater Saphenous Vein (Right)   Extubated uneventfully NSR w/ stable hemodynamics Breathing comfortably w/ O2 sats 100% Chest tube output low UOP > 100 mL/hr Labs okay  Plan: Continue routine early postop  Rexene Alberts, MD 08/27/2019 6:52 PM

## 2019-08-27 NOTE — Anesthesia Procedure Notes (Signed)
Central Venous Catheter Insertion Performed by: [name] Gaudy, MD, anesthesiologist Start/End5/25/2021 6:55 AM, 08/27/2019 7:00 AM Patient location: Pre-op. Preanesthetic checklist: patient identified, IV checked, site marked, risks and benefits discussed, surgical consent, monitors and equipment checked, pre-op evaluation, timeout performed and anesthesia consent Hand hygiene performed  and maximum sterile barriers used  PA cath was placed.Swan type:thermodilution Procedure performed without using ultrasound guided technique. Ultrasound Notes:anatomy identified, needle tip was noted to be adjacent to the nerve/plexus identified and no ultrasound evidence of intravascular and/or intraneural injection Attempts: 1 Following insertion, line sutured, dressing applied and Biopatch. Patient tolerated the procedure well with no immediate complications.

## 2019-08-27 NOTE — Discharge Instructions (Signed)

## 2019-08-27 NOTE — Anesthesia Preprocedure Evaluation (Signed)
Anesthesia Evaluation  Patient identified by MRN, date of birth, ID band Patient awake    Reviewed: Allergy & Precautions, NPO status , Patient's Chart, lab work & pertinent test results  Airway Mallampati: II  TM Distance: >3 FB Neck ROM: Full    Dental  (+) Teeth Intact, Dental Advisory Given   Pulmonary    breath sounds clear to auscultation       Cardiovascular hypertension,  Rhythm:Regular Rate:Normal     Neuro/Psych    GI/Hepatic   Endo/Other  diabetes  Renal/GU      Musculoskeletal   Abdominal   Peds  Hematology   Anesthesia Other Findings   Reproductive/Obstetrics                             Anesthesia Physical Anesthesia Plan  ASA: III  Anesthesia Plan: General   Post-op Pain Management:    Induction: Intravenous  PONV Risk Score and Plan: Ondansetron and Dexamethasone  Airway Management Planned: Oral ETT  Additional Equipment: Arterial line, PA Cath, 3D TEE and Ultrasound Guidance Line Placement  Intra-op Plan:   Post-operative Plan: Post-operative intubation/ventilation  Informed Consent: I have reviewed the patients History and Physical, chart, labs and discussed the procedure including the risks, benefits and alternatives for the proposed anesthesia with the patient or authorized representative who has indicated his/her understanding and acceptance.     Dental advisory given  Plan Discussed with: CRNA and Anesthesiologist  Anesthesia Plan Comments:         Anesthesia Quick Evaluation

## 2019-08-27 NOTE — Transfer of Care (Signed)
Immediate Anesthesia Transfer of Care Note  Patient: [name] [name]  Procedure(s) Performed: CORONARY ARTERY BYPASS GRAFTING (CABG) x7, LIMA TO LAD, SVG TO DIAG 1 WITH SEQUENTIAL SVG TO DIAG 2, SVG TO OM2, SVG TO PDA WITH SEQUENTIAL SVG TO PLB WITH SEQUENTIAL SVG TO OTHER. (N/A Chest) TRANSESOPHAGEAL ECHOCARDIOGRAM (TEE) (N/A ) Endovein Harvest Of Greater Saphenous Vein (Right )  Patient Location: SICU  Anesthesia Type:General  Level of Consciousness: drowsy and patient cooperative  Airway & Oxygen Therapy: Patient Spontanous Breathing  Post-op Assessment: Report given to RN and Post -op Vital signs reviewed and stable  Post vital signs: Reviewed and stable  Last Vitals:  Vitals Value Taken Time  BP 106/65 08/27/19 1456  Temp 36.1 C 08/27/19 1502  Pulse 81 08/27/19 1502  Resp 12 08/27/19 1502  SpO2 100 % 08/27/19 1502  Vitals shown include unvalidated device data.  Last Pain:  Vitals:   08/27/19 0350  TempSrc: Oral  PainSc:          Complications: No apparent anesthesia complications

## 2019-08-27 NOTE — Op Note (Signed)
CARDIOVASCULAR SURGERY OPERATIVE NOTE  08/27/2019  Surgeon:  Gaye Pollack, MD  First Assistant: Lars Pinks, PA-C   Preoperative Diagnosis:  Severe multi-vessel coronary artery disease   Postoperative Diagnosis:  Same   Procedure:  1. Median Sternotomy 2. Extracorporeal circulation 3.   Coronary artery bypass grafting x 7   Left internal mammary artery graft to the LAD  Sequential SVG to diagonal 1 and diagonal 2  SVG to OM  Sequential SVG to PDA, PL1 and PL2 4.   Endoscopic vein harvest from the right leg   Anesthesia:  General Endotracheal   Clinical History/Surgical Indication:  This 54 year old poorly controlled diabetic presents with high-grade left main and severe three-vessel coronary disease with unstable anginal symptoms and a non-ST segment elevation MI.  I personally reviewed his cardiac catheterization and echocardiogram studies and reviewed them with him.  I agree that coronary bypass graft surgery is the best treatment for resolution of his symptoms and to prevent further loss of myocardium. I discussed the operative procedure with the patient including alternatives, benefits and risks; including but not limited to bleeding, blood transfusion, infection, stroke, myocardial infarction, graft failure, heart block requiring a permanent pacemaker, organ dysfunction, and death.  [name] [name] understands and agrees to proceed.  Preparation:  The patient was seen in the preoperative holding area and the correct patient, correct operation were confirmed with the patient after reviewing the medical record and catheterization. The consent was signed by me. Preoperative antibiotics were given. A pulmonary arterial line and radial arterial line were placed by the anesthesia team. The patient was taken back to the operating room and positioned supine on the operating  room table. After being placed under general endotracheal anesthesia by the anesthesia team a foley catheter was placed. The neck, chest, abdomen, and both legs were prepped with betadine soap and solution and draped in the usual sterile manner. A surgical time-out was taken and the correct patient and operative procedure were confirmed with the nursing and anesthesia staff.   Cardiopulmonary Bypass:  A median sternotomy was performed. The pericardium was opened in the midline. Right ventricular function appeared normal. The ascending aorta was of normal size and had no palpable plaque. There were no contraindications to aortic cannulation or cross-clamping. The patient was fully systemically heparinized and the ACT was maintained > 400 sec. The proximal aortic arch was cannulated with a 20 F aortic cannula for arterial inflow. Venous cannulation was performed via the right atrial appendage using a two-staged venous cannula. An antegrade cardioplegia/vent cannula was inserted into the mid-ascending aorta. Aortic occlusion was performed with a single cross-clamp. Systemic cooling to 32 degrees Centigrade and topical cooling of the heart with iced saline were used. Hyperkalemic antegrade cold blood cardioplegia was used to induce diastolic arrest and was then given at about 20 minute intervals throughout the period of arrest to maintain myocardial temperature at or below 10 degrees centigrade. A temperature probe was inserted into the interventricular septum and an insulating pad was placed in the pericardium.   Left internal mammary artery harvest:  The left side of the sternum was retracted using the Rultract retractor. The left internal mammary artery was harvested as a pedicle graft. All side branches were clipped. It was a medium-sized vessel of good quality with excellent blood flow. It was ligated distally and divided. It was sprayed with topical papaverine solution to prevent  vasospasm.   Endoscopic vein harvest:  The right greater saphenous vein was harvested endoscopically through  a 2 cm incision medial to the right knee. It was harvested from the upper thigh to below the knee. It was a medium-sized vein of good quality. The side branches were all ligated with 4-0 silk ties.    Coronary arteries:  The coronary arteries were examined.   LAD:  Intramyocardial proximal to mid portion. Distal vessel medium caliber with patchy distal disease. Both diagonal vessels were heavily diseased proximally with mild distal disease.  LCX:  OM large, heavily diseased proximally,  Intramyocardial in mid and distal portion. It was located in the muscle in the mid portion and had no disease.  RCA:  PDA diffusely diseased but graftable in one spot proximally. PL branches were medium caliber with proximal stenoses but no distal disease beyond that.   Grafts:  1. LIMA to the LAD: 2.0 mm. It was sewn end to side using 8-0 prolene continuous suture. 2. Sequential SVG to diagonal 1:  1.6 mm. It was sewn sequential side to side using 7-0 prolene continuous suture. 3. Sequential SVG to diagonal 2:  1.6 mm. It was sewn sequential end to side using 7-0 prolene continuous suture. 4. SVG to OM:  1.75 mm. It was sewn end to side using 7-0 prolene continuous suture. 5.    Sequential SVG to PDA:  1.6 mm. It was sewn sequential side to side using 7-0 prolene continuous suture. 6.    Sequential SVG to PL 1:  1.6 mm. It was sewn sequential side to side using 7-0 prolene continuous suture. 7.    Sequential SVG to PL 2:  1.6 mm. It was sewn sequential end to side using 7-0 prolene continuous suture.  The proximal vein graft anastomoses were performed to the mid-ascending aorta using continuous 6-0 prolene suture. Graft markers were placed around the proximal anastomoses.   Completion:  The patient was rewarmed to 37 degrees Centigrade. The clamp was removed from the LIMA pedicle and there  was rapid warming of the septum and return of ventricular fibrillation. The crossclamp was removed with a time of 151 minutes. There was spontaneous return of sinus rhythm. The distal and proximal anastomoses were checked for hemostasis. The position of the grafts was satisfactory. Two temporary epicardial pacing wires were placed on the right atrium and two on the right ventricle. The patient was weaned from CPB without difficulty on no inotropes. CPB time was 173 minutes. Cardiac output was 5 LPM. TEE showed normal LV systolic function. Heparin was fully reversed with protamine and the aortic and venous cannulas removed. Hemostasis was achieved. Mediastinal and left pleural drainage tubes were placed. The sternum was closed with double #6 stainless steel wires. The fascia was closed with continuous # 1 vicryl suture. The subcutaneous tissue was closed with 2-0 vicryl continuous suture. The skin was closed with 3-0 vicryl subcuticular suture. All sponge, needle, and instrument counts were reported correct at the end of the case. Dry sterile dressings were placed over the incisions and around the chest tubes which were connected to pleurevac suction. The patient was then transported to the surgical intensive care unit in stable condition.

## 2019-08-27 NOTE — Anesthesia Procedure Notes (Signed)
Central Venous Catheter Insertion Performed by: [name] Gaudy, MD, anesthesiologist Start/End5/25/2021 6:50 AM, 08/27/2019 6:55 AM Patient location: Pre-op. Preanesthetic checklist: patient identified, IV checked, site marked, risks and benefits discussed, surgical consent, monitors and equipment checked, pre-op evaluation, timeout performed and anesthesia consent Lidocaine 1% used for infiltration and patient sedated Hand hygiene performed  and maximum sterile barriers used  Catheter size: 8.5 Fr Sheath introducer Procedure performed using ultrasound guided technique. Ultrasound Notes:anatomy identified, needle tip was noted to be adjacent to the nerve/plexus identified, no ultrasound evidence of intravascular and/or intraneural injection and image(s) printed for medical record Attempts: 1 Following insertion, line sutured and dressing applied. Post procedure assessment: blood return through all ports, free fluid flow and no air  Patient tolerated the procedure well with no immediate complications.

## 2019-08-27 NOTE — Brief Op Note (Signed)
08/25/2019 - 08/27/2019  12:42 PM  PATIENT:  [name] [name]  54 y.o. male  PRE-OPERATIVE DIAGNOSIS:  CAD  POST-OPERATIVE DIAGNOSIS:  CAD  PROCEDURE:  TRANSESOPHAGEAL ECHOCARDIOGRAM (TEE), MEDIAN STERNOTOMY for CORONARY ARTERY BYPASS GRAFTING (CABG) x 7 (LIMA to LAD, SVG SEQUENTIALLY to DIAGONAL 1 and DIAGONAL 2, SVG to OM, SVG SEQUENTIALLY to PDA, PLB1 and PLB2) with Endovein Harvest Of Right Greater Saphenous Vein and Left Internal Mammary Artery  SURGEON:  Surgeon(s) and Role:    Gaye Pollack, MD - Primary  PHYSICIAN ASSISTANT: Lars Pinks PA-C  ASSISTANTS: Ardyth Man RNFA  ANESTHESIA:   general  EBL:  Per anesthesia and perfusion record   DRAINS: Chest tubes placed in the mediastinal and pleural spaces   COUNTS CORRECT:  YES  DICTATION: .Dragon Dictation  PLAN OF CARE: Admit to inpatient   PATIENT DISPOSITION:  ICU - intubated and hemodynamically stable.   Delay start of Pharmacological VTE agent (>24hrs) due to surgical blood loss or risk of bleeding: yes  BASELINE WEIGHT: 98.3 kg

## 2019-08-27 NOTE — Procedures (Signed)
Extubation Procedure Note  Patient Details:   Name: [name] [name] DOB: 1966/03/14 MRN: [id number]   Airway Documentation:    Vent end date: 08/27/19 Vent end time: 1836   Evaluation  O2 sats: stable throughout Complications: No apparent complications Patient did tolerate procedure well. Bilateral Breath Sounds: Clear, Diminished   Pt extubated to 4L [place] per rapid wean protocol. NIF was -30 and VC was 1L. Pt had positive cuff leak and no stridor was noted. Pt able to voice his name.  Vilinda Blanks 08/27/2019, 6:38 PM

## 2019-08-27 NOTE — Anesthesia Procedure Notes (Signed)
Procedure Name: Intubation Date/Time: 08/27/2019 7:44 AM Performed by: Lance Coon, CRNA Pre-anesthesia Checklist: Patient identified, Emergency Drugs available, Suction available, Patient being monitored and Timeout performed Patient Re-evaluated:Patient Re-evaluated prior to induction Oxygen Delivery Method: Circle system utilized Preoxygenation: Pre-oxygenation with 100% oxygen Induction Type: IV induction Ventilation: Mask ventilation without difficulty Laryngoscope Size: Miller and 3 Grade View: Grade I Tube type: Oral Tube size: 8.0 mm Number of attempts: 1 Airway Equipment and Method: Stylet Placement Confirmation: ETT inserted through vocal cords under direct vision,  positive ETCO2 and breath sounds checked- equal and bilateral Secured at: 21 cm Tube secured with: Tape Dental Injury: Teeth and Oropharynx as per pre-operative assessment

## 2019-08-27 NOTE — Anesthesia Procedure Notes (Signed)
Arterial Line Insertion Start/End5/25/2021 6:35 AM, 08/27/2019 6:45 AM Performed by: Lance Coon, CRNA, CRNA  Patient location: Pre-op. Preanesthetic checklist: patient identified, IV checked, site marked, risks and benefits discussed, surgical consent, monitors and equipment checked, pre-op evaluation, timeout performed and anesthesia consent Lidocaine 1% used for infiltration Left, radial was placed Catheter size: 20 G Hand hygiene performed , maximum sterile barriers used  and Seldinger technique used  Attempts: 2 Procedure performed without using ultrasound guided technique. Following insertion, dressing applied and Biopatch. Post procedure assessment: normal and unchanged  Patient tolerated the procedure well with no immediate complications.

## 2019-08-27 NOTE — Anesthesia Postprocedure Evaluation (Signed)
Anesthesia Post Note  Patient: [name] [name]  Procedure(s) Performed: CORONARY ARTERY BYPASS GRAFTING (CABG) x7, LIMA TO LAD, SVG TO DIAG 1 WITH SEQUENTIAL SVG TO DIAG 2, SVG TO OM2, SVG TO PDA WITH SEQUENTIAL SVG TO PLB WITH SEQUENTIAL SVG TO OTHER. (N/A Chest) TRANSESOPHAGEAL ECHOCARDIOGRAM (TEE) (N/A ) Endovein Harvest Of Greater Saphenous Vein (Right )     Patient location during evaluation: SICU Anesthesia Type: General Level of consciousness: sedated Pain management: pain level controlled Vital Signs Assessment: post-procedure vital signs reviewed and stable Respiratory status: patient remains intubated per anesthesia plan Cardiovascular status: stable Postop Assessment: no apparent nausea or vomiting Anesthetic complications: no    Last Vitals:  Vitals:   08/27/19 1809 08/27/19 1836  BP:    Pulse: 89 92  Resp: 18 (!) 22  Temp: 37.2 C 37.4 C  SpO2: 100% 100%    Last Pain:  Vitals:   08/27/19 1836  TempSrc:   PainSc: 7                  Rennie Hack COKER

## 2019-08-28 ENCOUNTER — Inpatient Hospital Stay (HOSPITAL_COMMUNITY): Payer: Managed Care, Other (non HMO)

## 2019-08-28 LAB — BASIC METABOLIC PANEL
Anion gap: 10 (ref 5–15)
Anion gap: 9 (ref 5–15)
BUN: 14 mg/dL (ref 6–20)
BUN: 14 mg/dL (ref 6–20)
CO2: 20 mmol/L — ABNORMAL LOW (ref 22–32)
CO2: 21 mmol/L — ABNORMAL LOW (ref 22–32)
Calcium: 8 mg/dL — ABNORMAL LOW (ref 8.9–10.3)
Calcium: 7.7 mg/dL — ABNORMAL LOW (ref 8.9–10.3)
Chloride: 109 mmol/L (ref 98–111)
Chloride: 103 mmol/L (ref 98–111)
Creatinine, Ser: 0.84 mg/dL (ref 0.61–1.24)
Creatinine, Ser: 0.83 mg/dL (ref 0.61–1.24)
GFR calc Af Amer: >60 mL/min (ref >60)
GFR calc Af Amer: >60 mL/min (ref >60)
GFR calc non Af Amer: >60 mL/min (ref >60)
GFR calc non Af Amer: >60 mL/min (ref >60)
Glucose, Bld: 120 mg/dL — ABNORMAL HIGH (ref 70–99)
Glucose, Bld: 230 mg/dL — ABNORMAL HIGH (ref 70–99)
Potassium: 4.3 mmol/L (ref 3.5–5.1)
Potassium: 4.1 mmol/L (ref 3.5–5.1)
Sodium: 139 mmol/L (ref 135–145)
Sodium: 133 mmol/L — ABNORMAL LOW (ref 135–145)

## 2019-08-28 LAB — CBC
HCT: 29.6 % — ABNORMAL LOW (ref 39.0–52.0)
HCT: 29.9 % — ABNORMAL LOW (ref 39.0–52.0)
HCT: 29.1 % — ABNORMAL LOW (ref 39.0–52.0)
Hemoglobin: 10 g/dL — ABNORMAL LOW (ref 13.0–17.0)
Hemoglobin: 10.1 g/dL — ABNORMAL LOW (ref 13.0–17.0)
Hemoglobin: 9.6 g/dL — ABNORMAL LOW (ref 13.0–17.0)
MCH: 32.5 pg (ref 26.0–34.0)
MCH: 32.7 pg (ref 26.0–34.0)
MCH: 32 pg (ref 26.0–34.0)
MCHC: 33.8 g/dL (ref 30.0–36.0)
MCHC: 33.8 g/dL (ref 30.0–36.0)
MCHC: 33 g/dL (ref 30.0–36.0)
MCV: 96.1 fL (ref 80.0–100.0)
MCV: 96.8 fL (ref 80.0–100.0)
MCV: 97 fL (ref 80.0–100.0)
Platelets: 98 10*3/uL — ABNORMAL LOW (ref 150–400)
Platelets: 97 10*3/uL — ABNORMAL LOW (ref 150–400)
Platelets: 100 10*3/uL — ABNORMAL LOW (ref 150–400)
RBC: 3.08 MIL/uL — ABNORMAL LOW (ref 4.22–5.81)
RBC: 3.09 MIL/uL — ABNORMAL LOW (ref 4.22–5.81)
RBC: 3 MIL/uL — ABNORMAL LOW (ref 4.22–5.81)
RDW: 12.6 % (ref 11.5–15.5)
RDW: 12.6 % (ref 11.5–15.5)
RDW: 12.4 % (ref 11.5–15.5)
WBC: 7.4 10*3/uL (ref 4.0–10.5)
WBC: 8.8 10*3/uL (ref 4.0–10.5)
WBC: 8.9 10*3/uL (ref 4.0–10.5)
nRBC: 0 % (ref 0.0–0.2)
nRBC: 0 % (ref 0.0–0.2)
nRBC: 0 % (ref 0.0–0.2)

## 2019-08-28 LAB — GLUCOSE, CAPILLARY
Glucose-Capillary: 114 mg/dL — ABNORMAL HIGH (ref 70–99)
Glucose-Capillary: 122 mg/dL — ABNORMAL HIGH (ref 70–99)
Glucose-Capillary: 126 mg/dL — ABNORMAL HIGH (ref 70–99)
Glucose-Capillary: 127 mg/dL — ABNORMAL HIGH (ref 70–99)
Glucose-Capillary: 130 mg/dL — ABNORMAL HIGH (ref 70–99)
Glucose-Capillary: 163 mg/dL — ABNORMAL HIGH (ref 70–99)
Glucose-Capillary: 216 mg/dL — ABNORMAL HIGH (ref 70–99)
Glucose-Capillary: 220 mg/dL — ABNORMAL HIGH (ref 70–99)
Glucose-Capillary: 231 mg/dL — ABNORMAL HIGH (ref 70–99)

## 2019-08-28 LAB — MAGNESIUM
Magnesium: 2.3 mg/dL (ref 1.7–2.4)
Magnesium: 2 mg/dL (ref 1.7–2.4)

## 2019-08-28 LAB — CREATININE, SERUM
Creatinine, Ser: 0.96 mg/dL (ref 0.61–1.24)
GFR calc Af Amer: >60 mL/min (ref >60)
GFR calc non Af Amer: >60 mL/min (ref >60)

## 2019-08-28 MED ORDER — INSULIN DETEMIR 100 UNIT/ML ~~LOC~~ SOLN: 20.0000 [IU] | Freq: Every day | SUBCUTANEOUS | Status: DC

## 2019-08-28 MED ORDER — POTASSIUM CHLORIDE CRYS ER 20 MEQ PO TBCR
20.0000 meq | EXTENDED_RELEASE_TABLET | Freq: Two times a day (BID) | ORAL | Status: AC
  Administered 2019-08-28 (×2): 20 meq via ORAL
  Filled 2019-08-28 (×2): qty 1

## 2019-08-28 MED ORDER — INSULIN DETEMIR 100 UNIT/ML ~~LOC~~ SOLN
20.0000 [IU] | Freq: Two times a day (BID) | SUBCUTANEOUS | Status: DC
  Administered 2019-08-28: 20 [IU] via SUBCUTANEOUS
  Filled 2019-08-28 (×3): qty 0.2

## 2019-08-28 MED ORDER — FUROSEMIDE 10 MG/ML IJ SOLN
40.0000 mg | Freq: Two times a day (BID) | INTRAMUSCULAR | Status: AC
  Administered 2019-08-28 (×2): 40 mg via INTRAVENOUS
  Filled 2019-08-28 (×2): qty 4

## 2019-08-28 MED ORDER — INSULIN DETEMIR 100 UNIT/ML ~~LOC~~ SOLN
20.0000 [IU] | Freq: Every day | SUBCUTANEOUS | Status: DC
  Administered 2019-08-28: 20 [IU] via SUBCUTANEOUS
  Filled 2019-08-28: qty 0.2

## 2019-08-28 MED ORDER — INSULIN ASPART 100 UNIT/ML ~~LOC~~ SOLN
0.0000 [IU] | SUBCUTANEOUS | Status: DC
  Administered 2019-08-28: 4 [IU] via SUBCUTANEOUS
  Administered 2019-08-28 (×2): 8 [IU] via SUBCUTANEOUS
  Administered 2019-08-28: 2 [IU] via SUBCUTANEOUS
  Administered 2019-08-29 (×2): 4 [IU] via SUBCUTANEOUS

## 2019-08-28 MED ORDER — ENOXAPARIN SODIUM 40 MG/0.4ML ~~LOC~~ SOLN
40.0000 mg | Freq: Every day | SUBCUTANEOUS | Status: DC
  Administered 2019-08-28 – 2019-08-31 (×4): 40 mg via SUBCUTANEOUS
  Filled 2019-08-28 (×4): qty 0.4

## 2019-08-28 MED FILL — Thrombin (Recombinant) For Soln 20000 Unit: CUTANEOUS | Qty: 1 | Status: AC

## 2019-08-28 NOTE — Progress Notes (Addendum)
CT surgery p.m. Rounds  Patient examined and record reviewed.Hemodynamics stable,labs satisfactory.Patient had stable day.Continue current care.  Blood pressure 121/75, pulse 95, temperature 98.8 F (37.1 C), temperature source Oral, resp. rate (!) 24, height 5\' 10"  (1.778 m), weight 103.1 kg, SpO2 98 %.  [name] [name] 08/28/2019

## 2019-08-28 NOTE — Discharge Summary (Signed)
Physician Discharge Summary       [address]       [place] [address]             [phone]    Patient ID: [name] [name] MRN: [id number] DOB/AGE: [birth year] 54 y.o.  Admit date: 08/25/2019 Discharge date: 09/01/2019  Admission Diagnoses: 1. ACS (acute coronary syndrome) ([place]) 2. NSTEMI (non-ST elevated myocardial infarction) ([place]) 3.   Coronary artery disease  Discharge Diagnoses:  1.  S/P CABG x 7 2. Expected post op blood loss anemia 3. Acute combined systolic and diastolic heart failure ([place]) 4. Right internal carotid artery stenosis 40-59% 5. History of Diabetes mellitus type 2 in obese ([place]) 6. History of Hypertension 7. History of Hyperlipidemia 8. History of OSA (obstructive sleep apnea) 9. History of Hypothyroidism 10. History of GERD (gastroesophageal reflux disease) 11. History of NASH (nonalcoholic steatohepatitis) 12. History of lumbar back pain 13. History of anxiety    Consults: None  Procedure (s):  1. Median Sternotomy 2. Extracorporeal circulation 3.   Coronary artery bypass grafting x 7   Left internal mammary artery graft to the LAD  Sequential SVG to diagonal 1 and diagonal 2  SVG to OM  Sequential SVG to PDA, PL1 and PL2 4.   Endoscopic vein harvest from the right leg by Dr. Cyndia Bent on 08/27/2019.  History of Presenting Illness: The patient is a 54 year old gentleman with history of poorly controlled type 2 diabetes, hypertension, hyperlipidemia, hypothyroidism, and OSA who presented with a 6 to 29-monthhistory of progressive exertional fatigue and a 1 to 274-monthistory of left-sided chest pressure and belching with some radiation of pain down his left arm.  His symptoms have been occurring with less activity and his wife [facility].  His initial high-sensitivity troponin was 7922 and increased to 14,441.  [facility]  diagnosis of non-ST segment elevation MI.  Cardiac catheterization today showed 90% distal left main coronary stenosis with 70 to 80% stenoses involving the LAD, diagonal, large obtuse marginal, and right coronary artery branches.  Left ventricular ejection fraction was reduced to 45% with anterior hypokinesis.  LVEDP was 14 mmHg.  2D echocardiogram today confirm ejection fraction of 40 to 45% with moderate hypokinesis of the mid apical anteroseptal wall.  There is grade 2 diastolic dysfunction.  There is no significant valvular abnormality.  He denies orthopnea and PND.  He has no peripheral edema.  He said that he drinks about 6 beers a day and smokes occasional marijuana.  He is retired as a inCharity fundraisernd works part-time.  He is married and lives with his wife.  Dr. BaCyndia Bentersonally reviewed his cardiac catheterization and echocardiogram studies and reviewed them with him.  Dr. BaCyndia Bentgreed that coronary bypass graft surgery is the best treatment for resolution of his symptoms and to prevent further loss of myocardium. Dr. BaCyndia Bentiscussed the operative procedure with the patient including alternatives, benefits and risks. Patient agreed to proceed with surgery. Pre operative carotid duplex USKoreahowed no significant left internal carotid artery stenosis and a 40-59% right internal carotid artery stenosis. He underwent a CABG x 7 on 08/27/2019.  Brief Hospital Course:  The patient was extubated the evening of surgery without difficulty. He/she remained afebrile and hemodynamically stable. SwGordy Councilmana line, chest tubes, and foley were removed early in the post operative course. Lopressor was started and titrated  accordingly. He was volume over loaded and diuresed. He had ABL anemia. He did not require a post op transfusion. Last H and H was 10.2/29.7. He was weaned off the insulin drip.   The patient's glucose remained well controlled. He was inially restarted on Insulin. Metformin XR will  be resumed at discharge. The patient's HGA1C pre op was 10.8.  Patient instructed he will need close follow up with medical doctor. He is going to try to find an endocrinologist. The patient was felt surgically stable for transfer from the ICU to [facility] for further convalescence on 05/27. He continues to progress with cardiac rehab. He was ambulating on room air. He has been tolerating a diet and has had a bowel movement. Epicardial pacing wires were removed on 05/28.  Chest tube sutures will be removed in the office after discharge. The patient is felt surgically stable for discharge today.   Latest Vital Signs: Blood pressure 132/82, pulse 95, temperature 98.9 F (37.2 C), temperature source Oral, resp. rate 17, height _0  (1.778 m), weight 96.3 kg, SpO2 97 %.  Physical Exam:  General appearance: alert, cooperative and no distress Heart: regular rate and rhythm, S1, S2 normal, no murmur, click, rub or gallop Lungs: clear to auscultation bilaterally Abdomen: soft, non-tender; bowel sounds normal; no masses,  no organomegaly Extremities: extremities normal, atraumatic, no cyanosis or edema Wound: clean and dry  Discharge Condition: Stable and discharged to home.  Recent laboratory studies:  Lab Results  Component Value Date   WBC 9.0 08/30/2019   HGB 10.2 (L) 08/30/2019   HCT 29.7 (L) 08/30/2019   MCV 94.6 08/30/2019   PLT 152 08/30/2019   Lab Results  Component Value Date   NA 136 08/30/2019   K 4.3 08/30/2019   CL 101 08/30/2019   CO2 26 08/30/2019   CREATININE 0.93 08/30/2019   GLUCOSE 235 (H) 08/30/2019      Diagnostic Studies: DG Chest 2 View  Result Date: 08/31/2019 CLINICAL DATA:  Pleural effusion EXAM: CHEST - 2 VIEW COMPARISON:  Radiograph 08/29/2019 FINDINGS: Removal of IJ sheath. Sternal wires overlie stable cardiac silhouette. No effusion, infiltrate pneumothorax. Mild LEFT basilar atelectasis. IMPRESSION: 1. Removal of IJ sheath without complication. 2. No  pulmonary edema. 3. Mild LEFT basilar atelectasis. Electronically Signed   By: Suzy Bouchard M.D.   On: 08/31/2019 07:53   DG Chest 2 View  Result Date: 08/25/2019 CLINICAL DATA:  Left-sided chest pain EXAM: CHEST - 2 VIEW COMPARISON:  02/21/2011 FINDINGS: Cardiac shadow is within normal limits. The lungs are well aerated bilaterally. Mild central vascular prominence is seen with mild interstitial edema. No sizable effusion is noted. No focal infiltrate is seen. IMPRESSION: Changes of early CHF. Electronically Signed   By: Inez Catalina M.D.   On: 08/25/2019 08:52   CARDIAC CATHETERIZATION  Result Date: 08/26/2019 Conclusions: 1. Severe multivessel coronary artery disease, including 90% distal LMCA stenosis, as well as 70-80% lesions involving the mid LAD, D1, large OM1, and rPL branches. 2. Mildly to moderately reduced left ventricular contraction (LVEF ~45%) with anterior hypokinesis.  Upper normal left ventricular filling pressure. Conclusion: 1. Cardiac surgery consultation for CABG. 2. Restart heparin infusion 2 hours after TR band removal. 3. Aggressive secondary prevention. Nelva Bush, MD [facility]   DG Chest Port 1 View  Result Date: 08/29/2019 CLINICAL DATA:  Status post CABG. EXAM: PORTABLE CHEST 1 VIEW COMPARISON:  08/28/2019 FINDINGS: 0547 hours. The cardio pericardial silhouette is enlarged. Interval removal of bilateral chest  tubes, pulmonary artery catheter, and mediastinal/pericardial drain. Right IJ sheath remains in place. Minimal atelectasis noted left base. No edema or substantial pleural effusion. Telemetry leads overlie the chest. IMPRESSION: 1. Interval removal of support apparatus. 2. Left basilar atelectasis without pneumothorax or substantial pleural effusion. Electronically Signed   By: Misty Stanley M.D.   On: 08/29/2019 08:42   DG Chest Port 1 View  Result Date: 08/28/2019 CLINICAL DATA:  54 year old male status post CABG postoperative day 1. EXAM: PORTABLE  CHEST 1 VIEW COMPARISON:  Portable chest 08/27/2019 and earlier. FINDINGS: Portable AP semi upright view at 0540 hours. Extubated and enteric tube removed. Stable right IJ approach Swan-Ganz catheter, tip at the main pulmonary outflow. Stable mediastinal and chest tubes. Stable to slightly lower lung volumes with no pneumothorax or pulmonary edema identified. Mild atelectasis. Stable cardiac size and mediastinal contours. Right thoracic inlet probable thyroidectomy surgical clips again noted. IMPRESSION: 1. Extubated and enteric tube removed. Otherwise stable lines and tubes. 2. Mild atelectasis with No pneumothorax or pulmonary edema. Electronically Signed   By: Genevie Ann M.D.   On: 08/28/2019 09:13   DG Chest Port 1 View  Result Date: 08/27/2019 CLINICAL DATA:  Status post CABG. EXAM: PORTABLE CHEST 1 VIEW COMPARISON:  08/25/2019 FINDINGS: Sequelae of interval CABG are identified. An endotracheal tube terminates at the level of the clavicular heads, well above the carina. A right jugular Swan-Ganz catheter terminates over the main pulmonary artery. An enteric tube courses into the abdomen with side hole below the diaphragm and tip not imaged. Mediastinal drains and bilateral chest tubes are in place. The cardiac silhouette appears mildly enlarged, accentuated by portable AP technique. The lungs are hypoinflated with mild left basilar opacity likely reflecting atelectasis. No sizable pleural effusion or pneumothorax is identified. IMPRESSION: 1. Postoperative changes with support devices as above. 2. Mild left basilar atelectasis. Electronically Signed   By: Logan Bores M.D.   On: 08/27/2019 15:49   ECHOCARDIOGRAM COMPLETE  Result Date: 08/26/2019    ECHOCARDIOGRAM REPORT   Patient Name:   REMBERTO LIENHARD Date of Exam: 08/26/2019 Medical Rec #:  [id number]           Height:       70.0 in Accession #:    [id number]          Weight:       214.4 lb Date of Birth:  1965-11-01           BSA:          2.150 m  Patient Age:    42 years            BP:           115/76 mmHg Patient Gender: M                   HR:           85 bpm. Exam Location:  Inpatient Procedure: 2D Echo, Cardiac Doppler and Color Doppler Indications:    121-121.4 Non-ST elevation (NSTEMI) myocardial infarction  History:        Patient has no prior history of Echocardiogram examinations.                 Risk Factors:Hypertension, Diabetes, Dyslipidemia and Sleep                 Apnea. GERD. Hypothyroidism.  Sonographer:    Tiffany Dance Referring Phys: [id number] TIFFANY  IMPRESSIONS  1. Left ventricular ejection fraction,  by estimation, is 40 to 45%. The left ventricle has mildly decreased function. The left ventricle demonstrates regional wall motion abnormalities (see scoring diagram/findings for description). Left ventricular diastolic parameters are consistent with Grade II diastolic dysfunction (pseudonormalization). There is moderate hypokinesis of the left ventricular, mid-apical anteroseptal wall.  2. Right ventricular systolic function is normal. The right ventricular size is normal.  3. The mitral valve is normal in structure. Mild mitral valve regurgitation. No evidence of mitral stenosis.  4. The aortic valve is normal in structure. Aortic valve regurgitation is not visualized. No aortic stenosis is present. FINDINGS  Left Ventricle: Left ventricular ejection fraction, by estimation, is 40 to 45%. The left ventricle has mildly decreased function. The left ventricle demonstrates regional wall motion abnormalities. Moderate hypokinesis of the left ventricular, mid-apical anteroseptal wall. The left ventricular internal cavity size was normal in size. There is no left ventricular hypertrophy. Left ventricular diastolic parameters are consistent with Grade II diastolic dysfunction (pseudonormalization). Right Ventricle: The right ventricular size is normal. No increase in right ventricular wall thickness. Right ventricular systolic function  is normal. Left Atrium: Left atrial size was normal in size. Right Atrium: Right atrial size was normal in size. Pericardium: There is no evidence of pericardial effusion. Mitral Valve: The mitral valve is normal in structure. Mild mitral valve regurgitation. No evidence of mitral valve stenosis. Tricuspid Valve: The tricuspid valve is normal in structure. Tricuspid valve regurgitation is trivial. No evidence of tricuspid stenosis. Aortic Valve: The aortic valve is normal in structure. Aortic valve regurgitation is not visualized. No aortic stenosis is present. Pulmonic Valve: The pulmonic valve was normal in structure. Pulmonic valve regurgitation is trivial. No evidence of pulmonic stenosis. Aorta: The aortic root and ascending aorta are structurally normal, with no evidence of dilitation. IAS/Shunts: The atrial septum is grossly normal.  LEFT VENTRICLE PLAX 2D LVIDd:         4.36 cm  Diastology LVIDs:         3.28 cm  LV e' lateral:   5.55 cm/s LV PW:         0.96 cm  LV E/e' lateral: 12.7 LV IVS:        1.13 cm  LV e' medial:    6.31 cm/s LVOT diam:     2.00 cm  LV E/e' medial:  11.1 LV SV:         53 LV SV Index:   25 LVOT Area:     3.14 cm  RIGHT VENTRICLE            IVC RV Basal diam:  2.67 cm    IVC diam: 1.45 cm RV S prime:     9.57 cm/s TAPSE (M-mode): 1.8 cm LEFT ATRIUM             Index       RIGHT ATRIUM           Index LA diam:        4.20 cm 1.95 cm/m  RA Area:     10.90 cm LA Vol (A2C):   65.7 ml 30.56 ml/m RA Volume:   23.10 ml  10.74 ml/m LA Vol (A4C):   52.1 ml 24.23 ml/m LA Biplane Vol: 58.7 ml 27.30 ml/m  AORTIC VALVE LVOT Vmax:   87.20 cm/s LVOT Vmean:  60.700 cm/s LVOT VTI:    0.170 m  AORTA Ao Root diam: 3.90 cm Ao Asc diam:  3.60 cm MITRAL VALVE MV Area (PHT): 4.21 cm  SHUNTS MV Decel Time: 180 msec    Systemic VTI:  0.17 m MV E velocity: 70.30 cm/s  Systemic Diam: 2.00 cm MV A velocity: 66.50 cm/s MV E/A ratio:  1.06 Mertie Moores MD Electronically signed by Mertie Moores MD  Signature Date/Time: 08/26/2019/11:35:44 AM    Final    ECHO INTRAOPERATIVE TEE  Result Date: 08/27/2019  *INTRAOPERATIVE TRANSESOPHAGEAL REPORT *  Patient Name:   LELA GELL Date of Exam: 08/27/2019 Medical Rec #:  [id number]           Height:       70.0 in Accession #:    [id number]          Weight:       216.8 lb Date of Birth:  1965/11/01           BSA:          2.16 m Patient Age:    60 years            BP:           140/74 mmHg Patient Gender: M                   HR:           84 bpm. Exam Location:  Anesthesiology Transesophogeal exam was perform intraoperatively during surgical procedure. Patient was closely monitored under general anesthesia during the entirety of examination. Indications:     CAD Native Vessel Sonographer:     Raquel Sarna Senior RDCS Performing Phys: [id number]ack Diagnosing Phys: [name] Gaudy MD Complications: No known complications during this procedure. PRE-OP FINDINGS  Left Ventricle: The left ventricle has mildly reduced systolic function, with an ejection fraction of 45-50%. The cavity size was normal. There is no increase in left ventricular wall thickness. The LV cavity was normal in size with normal wall thickness. There was akinesis of the distal anterior wall, anterior septum, and apex. The ejection fraction was estimated at 45-50% using the Simpson's method. On the post-bypass exam, the LV function appeared unchanged from the pre-bypass exam. The ejection fraction was estimated at 45-50%. Right Ventricle: The right ventricle has normal systolic function. The cavity was normal. There is no increase in right ventricular wall thickness. Left Atrium: Left atrial size was dilated and measured 4.3 cm in diameter. The left atrial appendage is well visualized and there is no evidence of thrombus present. Right Atrium: Right atrial size was normal in size. The interatrial septum is seen bowed toward the right, consistent with elevated left atrial pressures. Interatrial Septum:  Evidence of atrial level shunting detected by color flow Doppler. There was a patent foramen ovale present with continuous left to right flow. This appeared hemodynamically insignificant. Pericardium: There is no evidence of pericardial effusion. Mitral Valve: The mitral valve is normal in structure. No thickening of the mitral valve leaflet. No calcification of the mitral valve leaflet. Mitral valve regurgitation is trivial by color flow Doppler. There is No evidence of mitral stenosis. Tricuspid Valve: The tricuspid valve was normal in structure. Tricuspid valve regurgitation is trivial by color flow Doppler. There is no evidence of tricuspid valve vegetation. Aortic Valve: The aortic valve is tricuspid There is mild thickening of the aortic valve Aortic valve regurgitation was not visualized by color flow Doppler. There is no evidence of aortic valve stenosis. There is no evidence of a vegetation on the aortic valve. Pulmonic Valve: The pulmonic valve was normal in structure, with normal. No evidence of pumonic  stenosis. Pulmonic valve regurgitation is trivial by color flow Doppler.  [name] Gaudy MD Electronically signed by [name] Gaudy MD Signature Date/Time: 08/27/2019/7:37:04 PM    Final    VAS US DOPPLER PRE CABG  Result Date: 08/26/2019 PREOPERATIVE VASCULAR EVALUATION  Indications:      Pre-CABG. Risk Factors:     Hypertension, hyperlipidemia, Diabetes, coronary artery                   disease. Other Factors:    ETOH abouse. Comparison Study: Prior ABI from 07/14/16 is available for comaprison Performing Technologist: Sharion Dove RVS  Examination Guidelines: A complete evaluation includes B-mode imaging, spectral Doppler, color Doppler, and power Doppler as needed of all accessible portions of each vessel. Bilateral testing is considered an integral part of a complete examination. Limited examinations for reoccurring indications may be performed as noted.  Right Carotid Findings:  +----------+--------+--------+--------+------------+------------------+           PSV cm/sEDV cm/sStenosisDescribe    Comments           +----------+--------+--------+--------+------------+------------------+ CCA Prox  76      20                          intimal thickening +----------+--------+--------+--------+------------+------------------+ CCA Distal65      19                          intimal thickening +----------+--------+--------+--------+------------+------------------+ ICA Prox  105     43      40-59%  heterogenous                   +----------+--------+--------+--------+------------+------------------+ ICA Distal92      36                                             +----------+--------+--------+--------+------------+------------------+ ECA       106     14                                             +----------+--------+--------+--------+------------+------------------+ Portions of this table do not appear on this page. +----------+--------+-------+--------+------------+           PSV cm/sEDV cmsDescribeArm Pressure +----------+--------+-------+--------+------------+ Subclavian87                                  +----------+--------+-------+--------+------------+ +---------+--------+--+--------+--+ VertebralPSV cm/s31EDV cm/s12 +---------+--------+--+--------+--+ Left Carotid Findings: +----------+--------+--------+--------+--------+------------------+           PSV cm/sEDV cm/sStenosisDescribeComments           +----------+--------+--------+--------+--------+------------------+ CCA Prox  88      20                      intimal thickening +----------+--------+--------+--------+--------+------------------+ CCA Distal88      24                      intimal thickening +----------+--------+--------+--------+--------+------------------+ ICA Prox  85      23              calcific                    +----------+--------+--------+--------+--------+------------------+  ICA Distal93      36                                         +----------+--------+--------+--------+--------+------------------+ ECA       85      8                                          +----------+--------+--------+--------+--------+------------------+ +----------+--------+--------+--------+------------+ SubclavianPSV cm/sEDV cm/sDescribeArm Pressure +----------+--------+--------+--------+------------+           72                                   +----------+--------+--------+--------+------------+ +---------+--------+--+--------+--+ VertebralPSV cm/s35EDV cm/s13 +---------+--------+--+--------+--+  ABI Findings: +--------+------------------+-----+---------+-----------------------+ Right   Rt Pressure (mmHg)IndexWaveform Comment                 +--------+------------------+-----+---------+-----------------------+ Brachial                       triphasicrestricted (cath today) +--------+------------------+-----+---------+-----------------------+ PTA     141               1.32 biphasic                         +--------+------------------+-----+---------+-----------------------+ DP      130               1.21 biphasic                         +--------+------------------+-----+---------+-----------------------+ +--------+------------------+-----+---------+-------+ Left    Lt Pressure (mmHg)IndexWaveform Comment +--------+------------------+-----+---------+-------+ [id number]                    triphasic        +--------+------------------+-----+---------+-------+ PTA     141               1.32 biphasic         +--------+------------------+-----+---------+-------+ DP      130               1.21 biphasic         +--------+------------------+-----+---------+-------+ +-------+---------------+----------------+ ABI/TBIToday's ABI/TBIPrevious ABI/TBI  +-------+---------------+----------------+ Right  1.32           1.15             +-------+---------------+----------------+ Left   1.32           1.11             +-------+---------------+----------------+ Right ABIs appear essentially unchanged. Left ABIs appear essentially unchanged. Right Doppler Findings: +-----------+--------+-----+---------+-----------------------+ Site       PressureIndexDoppler  Comments                +-----------+--------+-----+---------+-----------------------+ Brachial                triphasicrestricted (cath today) +-----------+--------+-----+---------+-----------------------+ Radial                  triphasic                        +-----------+--------+-----+---------+-----------------------+ Ulnar                   triphasic                        +-----------+--------+-----+---------+-----------------------+  Palmar Arch                      radial cath today       +-----------+--------+-----+---------+-----------------------+  Left Doppler Findings: +--------+--------+-----+---------+--------+ Site    PressureIndexDoppler  Comments +--------+--------+-----+---------+--------+ [id number]          triphasic         +--------+--------+-----+---------+--------+ Radial               triphasic         +--------+--------+-----+---------+--------+ Ulnar                triphasic         +--------+--------+-----+---------+--------+  Summary: Right Carotid: Velocities in the right ICA are consistent with a 40-59%                stenosis. Left Carotid: Velocities in the left ICA are consistent with a 1-39% stenosis. Vertebrals:  Bilateral vertebral arteries demonstrate antegrade flow. Subclavians: Normal flow hemodynamics were seen in bilateral subclavian              arteries. Right ABI: Resting right ankle-brachial index is within normal range. No evidence of significant right lower extremity arterial disease. Left ABI: Resting  left ankle-brachial index is within normal range. No evidence of significant left lower extremity arterial disease. Left Upper Extremity: Doppler waveforms decrease >50% with left radial compression. Doppler waveform obliterate with left ulnar compression.  Electronically signed by Ruta Hinds MD on 08/26/2019 at 6:32:06 PM.    Final        Discharge Instructions    Amb Referral to Cardiac Rehabilitation   Complete by: As directed    Diagnosis: CABG   CABG X ___: 7   After initial evaluation and assessments completed: Virtual Based Care may be provided alone or in conjunction with Phase 2 Cardiac Rehab based on patient barriers.: Yes      Discharge Medications: Allergies as of 09/01/2019   No Known Allergies     Medication List    STOP taking these medications   aspirin 81 MG tablet Replaced by: aspirin 325 MG EC tablet   losartan-hydrochlorothiazide 100-25 MG tablet Commonly known as: HYZAAR   MILK THISTLE PO   predniSONE 20 MG tablet Commonly known as: DELTASONE     TAKE these medications   acetaminophen 325 MG tablet Commonly known as: TYLENOL Take 2 tablets (650 mg total) by mouth every 6 (six) hours as needed for mild pain.   aspirin 325 MG EC tablet Take 1 tablet (325 mg total) by mouth daily. Replaces: aspirin 81 MG tablet   blood glucose meter kit and supplies Kit Dispense based on patient and insurance preference. Use up to 3 times daily as directed. (FOR ICD-10-E11.9 and Z79.4).   citalopram 40 MG tablet Commonly known as: CELEXA Take 1 tablet Daily for Mood What changed:   how much to take  how to take this  when to take this  additional instructions   famotidine 20 MG tablet Commonly known as: PEPCID Take 1 tablet (20 mg total) by mouth 2 (two) times daily.   FreeStyle [place] 2 Reader [place] 1 each with 6 refills. Use as directed   FreeStyle Libre 2 Sensor Misc Please supply one box and 6 refills. Use as directed   levothyroxine 137 MCG  tablet Commonly known as: SYNTHROID Take 1 tablet daily on an empty stomach with only water for 30 minutes & no Antacid meds, Calcium or Magnesium for  4 hours & avoid Biotin What changed:   how much to take  how to take this  when to take this  additional instructions   metFORMIN 500 MG 24 hr tablet Commonly known as: GLUCOPHAGE-XR Take 2 tablets 2 x  /day with Meals for Diabetes What changed:   how much to take  how to take this  when to take this  additional instructions   metoprolol tartrate 25 MG tablet Commonly known as: LOPRESSOR Take 1 tablet (25 mg total) by mouth 2 (two) times daily.   NovoLIN 70/30 FlexPen (70-30) 100 UNIT/ML KwikPen Generic drug: insulin isophane & regular human Take 40 units with breakfast and 20-35 units with dinner (start with 20 units with dinner, slowly titrate up until fasting glucose is <150).   omeprazole 40 MG capsule Commonly known as: PRILOSEC Take 1 capsule Daily for Indigestion & Reflux What changed:   how much to take  how to take this  when to take this  additional instructions   rosuvastatin 40 MG tablet Commonly known as: CRESTOR Take 1 tablet Daily for Cholesterol What changed:   how much to take  how to take this  when to take this  additional instructions   traMADol 50 MG tablet Commonly known as: ULTRAM Take 1 tablet (50 mg total) by mouth every 6 (six) hours as needed for moderate pain.   VITAMIN D PO Take 10,000 Units by mouth daily.      The patient has been discharged on:   1.Beta Blocker:  Yes [ x  ]                              No   [   ]                              If No, reason:  2.Ace Inhibitor/ARB: Yes [   ]                                     No  [  x  ]                                     If No, reason:  3.Statin:   Yes [ x  ]                  No  [   ]                  If No, reason:  4.Ecasa:  Yes  [ x  ]                  No   [   ]                  If No,  reason:  Follow Up Appointments: Follow-up Information    Gaye Pollack, MD. Go to.   Specialty: Cardiothoracic Surgery Why: PA/LAT CXR to be taken (at [facility] which is in the same bulding as Dr. Vivi Martens office) on at;Our office will call with an appointment Contact information: [address] [place] [address] [phone]        [facility] [place] Follow up.   Specialty: Cardiothoracic Surgery Why: Appointment is with nurse only to have chest tube sutures removed. Our office will call you with an appointment. Removal should occur 1 week after discharge Contact information: [place], [place]       Unk Pinto, MD. Call.   Specialty: Internal Medicine Why: for a follow up appointment regarding further diabetes management and surveillance of HGA1C 10.8 Contact information: [place] [address] [phone]        Skeet Latch, MD .   Specialty: Cardiology Contact information: [address] [place] [address] [phone]        Nelva Bush, MD Follow up.   Specialty: Cardiology Why: PLease call office and make an appointment for 2 weeks. Contact information: [place] [address] [phone]           Signed: Terance Hart ContePA-C 09/01/2019, 8:43 AM

## 2019-08-28 NOTE — Progress Notes (Signed)
Progress Note  Patient Name: [name] [name] Date of Encounter: 08/28/2019  Primary Cardiologist: Skeet Latch, MD (new)  Subjective   Feeling well.  Mild right-sided chest discomfort with deep inspiration.  Otherwise postoperative pain is well-controlled.  No complaints.  Inpatient Medications    Scheduled Meds: . acetaminophen  1,000 mg Oral Q6H   Or  . acetaminophen (TYLENOL) oral liquid 160 mg/5 mL  1,000 mg Per Tube Q6H  . aspirin EC  325 mg Oral Daily   Or  . aspirin  324 mg Per Tube Daily  . bisacodyl  10 mg Oral Daily   Or  . bisacodyl  10 mg Rectal Daily  . Chlorhexidine Gluconate Cloth  6 each Topical Daily  . cholecalciferol  1,000 Units Oral Daily  . citalopram  40 mg Oral Daily  . docusate sodium  200 mg Oral Daily  . enoxaparin (LOVENOX) injection  40 mg Subcutaneous QHS  . furosemide  40 mg Intravenous BID  . insulin aspart  0-24 Units Subcutaneous Q4H  . insulin detemir  20 Units Subcutaneous Daily  . levothyroxine  137 mcg Oral Q0600  . mouth rinse  15 mL Mouth Rinse BID  . metoprolol tartrate  12.5 mg Oral BID   Or  . metoprolol tartrate  12.5 mg Per Tube BID  . mupirocin ointment  1 application Nasal BID  . [START ON 08/29/2019] pantoprazole  40 mg Oral Daily  . potassium chloride  20 mEq Oral BID  . rosuvastatin  40 mg Oral Daily  . sodium chloride flush  10-40 mL Intracatheter Q12H  . sodium chloride flush  3 mL Intravenous Q12H   Continuous Infusions: . sodium chloride 20 mL/hr at 08/28/19 0800  . sodium chloride    . sodium chloride 20 mL/hr at 08/27/19 1500  . cefUROXime (ZINACEF)  IV Stopped (08/28/19 0540)  . lactated ringers    . lactated ringers 20 mL/hr at 08/28/19 0800  . nitroGLYCERIN Stopped (08/27/19 1450)  . phenylephrine (NEO-SYNEPHRINE) Adult infusion Stopped (08/28/19 0412)   PRN Meds: sodium chloride, dextrose, metoprolol tartrate, morphine injection, ondansetron (ZOFRAN) IV, oxyCODONE, sodium chloride flush, sodium  chloride flush, traMADol   Vital Signs    Vitals:   08/28/19 0630 08/28/19 0645 08/28/19 0700 08/28/19 0800  BP: 106/74  99/69   Pulse: 96 94 95 91  Resp: (!) 27 20 (!) 23 12  Temp: 99.1 F (37.3 C) 99 F (37.2 C) 98.8 F (37.1 C) 98.8 F (37.1 C)  TempSrc:    Core  SpO2: 95% 95% 96% 95%  Weight:      Height:        Intake/Output Summary (Last 24 hours) at 08/28/2019 1006 Last data filed at 08/28/2019 0800 Gross per 24 hour  Intake 5077.52 ml  Output 5180 ml  Net -102.48 ml   Last 3 Weights 08/28/2019 08/27/2019 08/25/2019  Weight (lbs) 227 lb 4.7 oz 216 lb 12.8 oz 214 lb 6.4 oz  Weight (kg) 103.1 kg 98.34 kg 97.251 kg      Telemetry    Sinus rhythm.  PVCs- Personally Reviewed  ECG    08/26/19: Sinus rhythm.  Rate 79 bpm.  Inferior infarct 08/28/2019: Sinus rhythm.  Rate 95 bpm.  Inferior Q waves no longer present.  Nonspecific T wave abnormalities. - Personally Reviewed  Physical Exam   VS:  BP 99/69   Pulse 91   Temp 98.8 F (37.1 C) (Core)   Resp 12   Ht 5\' 10"  (  1.778 m)   Wt 103.1 kg   SpO2 95%   BMI 32.61 kg/m  , BMI Body mass index is 32.61 kg/m. GENERAL:  Well appearing HEENT: Pupils equal round and reactive, fundi not visualized, oral mucosa unremarkable NECK:  No jugular venous distention, waveform within normal limits, carotid upstroke brisk and symmetric, no bruits LUNGS:  Clear to auscultation bilaterally Chest: Midline incision with dressing in place. HEART:  RRR.  PMI not displaced or sustained,S1 and S2 within normal limits, no S3, no S4, no clicks, no rubs, no murmurs ABD:  Flat, positive bowel sounds normal in frequency in pitch, no bruits, no rebound, no guarding, no midline pulsatile mass, no hepatomegaly, no splenomegaly EXT:  2 plus pulses throughout, trace edema, no cyanosis no clubbing SKIN:  No rashes no nodules NEURO:  Cranial nerves II through XII grossly intact, motor grossly intact throughout [facility]:  Cognitively intact, oriented to  person place and time  Labs    High Sensitivity Troponin:   Recent Labs  Lab 08/25/19 0810 08/25/19 1010 08/25/19 1408 08/25/19 1537  TROPONINIHS 7,922* 13,766* 14,441* 12,219*      Chemistry Recent Labs  Lab 08/25/19 0810 08/25/19 0810 08/26/19 0749 08/27/19 0804 08/27/19 1329 08/27/19 1502 08/27/19 1954 08/27/19 2100 08/28/19 0412  NA 137   < > 135   < > 143   < > 144 142 139  K 4.5   < > 3.5   < > 4.2   < > 5.0 5.0 4.3  CL 98   < > 101   < > 106  --   --  113* 109  CO2 22   < > 23  --   --   --   --  23 20*  GLUCOSE 356*   < > 205*   < > 120*  --   --  138* 120*  BUN 24*   < > 19   < > 15  --   --  15 14  CREATININE 1.01   < > 0.88   < > 0.60*  --   --  0.85 0.84  CALCIUM 9.4   < > 8.8*  --   --   --   --  8.0* 8.0*  PROT 8.4*  --   --   --   --   --   --   --   --   ALBUMIN 4.7  --   --   --   --   --   --   --   --   AST 87*  --   --   --   --   --   --   --   --   ALT 36  --   --   --   --   --   --   --   --   ALKPHOS 81  --   --   --   --   --   --   --   --   BILITOT 1.4*  --   --   --   --   --   --   --   --   GFRNONAA >60   < > >60  --   --   --   --  >60 >60  GFRAA >60   < > >60  --   --   --   --  >60 >60  ANIONGAP 17*   < > 11  --   --   --   --  6 10   < > = values in this interval not displayed.     Hematology Recent Labs  Lab 08/27/19 1502 08/27/19 1829 08/27/19 1954 08/27/19 2100 08/28/19 0412  WBC 7.1  --   --  11.2* 7.4  RBC 3.48*  --   --  3.40* 3.08*  HGB 9.5*  11.3*   < > 10.2* 11.0* 10.0*  HCT 28.0*  32.6*   < > 30.0* 32.5* 29.6*  MCV 93.7  --   --  95.6 96.1  MCH 32.5  --   --  32.4 32.5  MCHC 34.7  --   --  33.8 33.8  RDW 11.9  --   --  12.2 12.6  PLT 113*  --   --  137* 98*   < > = values in this interval not displayed.    BNPNo results for input(s): BNP, PROBNP in the last 168 hours.   DDimer  Recent Labs  Lab 08/25/19 0810  DDIMER 0.35     Radiology    CARDIAC CATHETERIZATION  Result Date:  08/26/2019 Conclusions: 1. Severe multivessel coronary artery disease, including 90% distal LMCA stenosis, as well as 70-80% lesions involving the mid LAD, D1, large OM1, and rPL branches. 2. Mildly to moderately reduced left ventricular contraction (LVEF ~45%) with anterior hypokinesis.  Upper normal left ventricular filling pressure. Conclusion: 1. Cardiac surgery consultation for CABG. 2. Restart heparin infusion 2 hours after TR band removal. 3. Aggressive secondary prevention. Nelva Bush, MD [facility]   DG Chest Port 1 View  Result Date: 08/28/2019 CLINICAL DATA:  54 year old male status post CABG postoperative day 1. EXAM: PORTABLE CHEST 1 VIEW COMPARISON:  Portable chest 08/27/2019 and earlier. FINDINGS: Portable AP semi upright view at 0540 hours. Extubated and enteric tube removed. Stable right IJ approach Swan-Ganz catheter, tip at the main pulmonary outflow. Stable mediastinal and chest tubes. Stable to slightly lower lung volumes with no pneumothorax or pulmonary edema identified. Mild atelectasis. Stable cardiac size and mediastinal contours. Right thoracic inlet probable thyroidectomy surgical clips again noted. IMPRESSION: 1. Extubated and enteric tube removed. Otherwise stable lines and tubes. 2. Mild atelectasis with No pneumothorax or pulmonary edema. Electronically Signed   By: Genevie Ann M.D.   On: 08/28/2019 09:13   DG Chest Port 1 View  Result Date: 08/27/2019 CLINICAL DATA:  Status post CABG. EXAM: PORTABLE CHEST 1 VIEW COMPARISON:  08/25/2019 FINDINGS: Sequelae of interval CABG are identified. An endotracheal tube terminates at the level of the clavicular heads, well above the carina. A right jugular Swan-Ganz catheter terminates over the main pulmonary artery. An enteric tube courses into the abdomen with side hole below the diaphragm and tip not imaged. Mediastinal drains and bilateral chest tubes are in place. The cardiac silhouette appears mildly enlarged, accentuated by  portable AP technique. The lungs are hypoinflated with mild left basilar opacity likely reflecting atelectasis. No sizable pleural effusion or pneumothorax is identified. IMPRESSION: 1. Postoperative changes with support devices as above. 2. Mild left basilar atelectasis. Electronically Signed   By: Logan Bores M.D.   On: 08/27/2019 15:49   ECHO INTRAOPERATIVE TEE  Result Date: 08/27/2019  *INTRAOPERATIVE TRANSESOPHAGEAL REPORT *  Patient Name:   [name] [name] Date of Exam: 08/27/2019 Medical Rec #:  [id number]           Height:       70.0 in Accession #:    [id number]          Weight:  216.8 lb Date of Birth:  [birth year]           BSA:          2.16 m Patient Age:    52 years            BP:           140/74 mmHg Patient Gender: M                   HR:           84 bpm. Exam Location:  Anesthesiology Transesophogeal exam was perform intraoperatively during surgical procedure. Patient was closely monitored under general anesthesia during the entirety of examination. Indications:     CAD Native Vessel Sonographer:     Raquel Sarna Senior RDCS Performing Phys: [id number]ack Diagnosing Phys: [name] Gaudy MD Complications: No known complications during this procedure. PRE-OP FINDINGS  Left Ventricle: The left ventricle has mildly reduced systolic function, with an ejection fraction of 45-50%. The cavity size was normal. There is no increase in left ventricular wall thickness. The LV cavity was normal in size with normal wall thickness. There was akinesis of the distal anterior wall, anterior septum, and apex. The ejection fraction was estimated at 45-50% using the Simpson's method. On the post-bypass exam, the LV function appeared unchanged from the pre-bypass exam. The ejection fraction was estimated at 45-50%. Right Ventricle: The right ventricle has normal systolic function. The cavity was normal. There is no increase in right ventricular wall thickness. Left Atrium: Left atrial size was dilated and  measured 4.3 cm in diameter. The left atrial appendage is well visualized and there is no evidence of thrombus present. Right Atrium: Right atrial size was normal in size. The interatrial septum is seen bowed toward the right, consistent with elevated left atrial pressures. Interatrial Septum: Evidence of atrial level shunting detected by color flow Doppler. There was a patent foramen ovale present with continuous left to right flow. This appeared hemodynamically insignificant. Pericardium: There is no evidence of pericardial effusion. Mitral Valve: The mitral valve is normal in structure. No thickening of the mitral valve leaflet. No calcification of the mitral valve leaflet. Mitral valve regurgitation is trivial by color flow Doppler. There is No evidence of mitral stenosis. Tricuspid Valve: The tricuspid valve was normal in structure. Tricuspid valve regurgitation is trivial by color flow Doppler. There is no evidence of tricuspid valve vegetation. Aortic Valve: The aortic valve is tricuspid There is mild thickening of the aortic valve Aortic valve regurgitation was not visualized by color flow Doppler. There is no evidence of aortic valve stenosis. There is no evidence of a vegetation on the aortic valve. Pulmonic Valve: The pulmonic valve was normal in structure, with normal. No evidence of pumonic stenosis. Pulmonic valve regurgitation is trivial by color flow Doppler.  [name] Gaudy MD Electronically signed by [name] Gaudy MD Signature Date/Time: 08/27/2019/7:37:04 PM    Final    VAS US DOPPLER PRE CABG  Result Date: 08/26/2019 PREOPERATIVE VASCULAR EVALUATION  Indications:      Pre-CABG. Risk Factors:     Hypertension, hyperlipidemia, Diabetes, coronary artery                   disease. Other Factors:    ETOH abouse. Comparison Study: Prior ABI from 07/14/16 is available for comaprison Performing Technologist: Sharion Dove RVS  Examination Guidelines: A complete evaluation includes B-mode imaging,  spectral Doppler, color Doppler, and power Doppler as needed of all accessible  portions of each vessel. Bilateral testing is considered an integral part of a complete examination. Limited examinations for reoccurring indications may be performed as noted.  Right Carotid Findings: +----------+--------+--------+--------+------------+------------------+           PSV cm/sEDV cm/sStenosisDescribe    Comments           +----------+--------+--------+--------+------------+------------------+ CCA Prox  76      20                          intimal thickening +----------+--------+--------+--------+------------+------------------+ CCA Distal65      19                          intimal thickening +----------+--------+--------+--------+------------+------------------+ ICA Prox  105     43      40-59%  heterogenous                   +----------+--------+--------+--------+------------+------------------+ ICA Distal92      36                                             +----------+--------+--------+--------+------------+------------------+ ECA       106     14                                             +----------+--------+--------+--------+------------+------------------+ Portions of this table do not appear on this page. +----------+--------+-------+--------+------------+           PSV cm/sEDV cmsDescribeArm Pressure +----------+--------+-------+--------+------------+ Subclavian87                                  +----------+--------+-------+--------+------------+ +---------+--------+--+--------+--+ VertebralPSV cm/s31EDV cm/s12 +---------+--------+--+--------+--+ Left Carotid Findings: +----------+--------+--------+--------+--------+------------------+           PSV cm/sEDV cm/sStenosisDescribeComments           +----------+--------+--------+--------+--------+------------------+ CCA Prox  88      20                      intimal thickening  +----------+--------+--------+--------+--------+------------------+ CCA Distal88      24                      intimal thickening +----------+--------+--------+--------+--------+------------------+ ICA Prox  85      23              calcific                   +----------+--------+--------+--------+--------+------------------+ ICA Distal93      36                                         +----------+--------+--------+--------+--------+------------------+ ECA       85      8                                          +----------+--------+--------+--------+--------+------------------+ +----------+--------+--------+--------+------------+ SubclavianPSV cm/sEDV cm/sDescribeArm Pressure +----------+--------+--------+--------+------------+  72                                   +----------+--------+--------+--------+------------+ +---------+--------+--+--------+--+ VertebralPSV cm/s35EDV cm/s13 +---------+--------+--+--------+--+  ABI Findings: +--------+------------------+-----+---------+-----------------------+ Right   Rt Pressure (mmHg)IndexWaveform Comment                 +--------+------------------+-----+---------+-----------------------+ Brachial                       triphasicrestricted (cath today) +--------+------------------+-----+---------+-----------------------+ PTA     141               1.32 biphasic                         +--------+------------------+-----+---------+-----------------------+ DP      130               1.21 biphasic                         +--------+------------------+-----+---------+-----------------------+ +--------+------------------+-----+---------+-------+ Left    Lt Pressure (mmHg)IndexWaveform Comment +--------+------------------+-----+---------+-------+ [id number]                    triphasic        +--------+------------------+-----+---------+-------+ PTA     141               1.32 biphasic          +--------+------------------+-----+---------+-------+ DP      130               1.21 biphasic         +--------+------------------+-----+---------+-------+ +-------+---------------+----------------+ ABI/TBIToday's ABI/TBIPrevious ABI/TBI +-------+---------------+----------------+ Right  1.32           1.15             +-------+---------------+----------------+ Left   1.32           1.11             +-------+---------------+----------------+ Right ABIs appear essentially unchanged. Left ABIs appear essentially unchanged. Right Doppler Findings: +-----------+--------+-----+---------+-----------------------+ Site       PressureIndexDoppler  Comments                +-----------+--------+-----+---------+-----------------------+ Brachial                triphasicrestricted (cath today) +-----------+--------+-----+---------+-----------------------+ Radial                  triphasic                        +-----------+--------+-----+---------+-----------------------+ Ulnar                   triphasic                        +-----------+--------+-----+---------+-----------------------+ Palmar Arch                      radial cath today       +-----------+--------+-----+---------+-----------------------+  Left Doppler Findings: +--------+--------+-----+---------+--------+ Site    PressureIndexDoppler  Comments +--------+--------+-----+---------+--------+ [id number]          triphasic         +--------+--------+-----+---------+--------+ Radial               triphasic         +--------+--------+-----+---------+--------+ Ulnar  triphasic         +--------+--------+-----+---------+--------+  Summary: Right Carotid: Velocities in the right ICA are consistent with a 40-59%                stenosis. Left Carotid: Velocities in the left ICA are consistent with a 1-39% stenosis. Vertebrals:  Bilateral vertebral arteries demonstrate antegrade flow.  Subclavians: Normal flow hemodynamics were seen in bilateral subclavian              arteries. Right ABI: Resting right ankle-brachial index is within normal range. No evidence of significant right lower extremity arterial disease. Left ABI: Resting left ankle-brachial index is within normal range. No evidence of significant left lower extremity arterial disease. Left Upper Extremity: Doppler waveforms decrease >50% with left radial compression. Doppler waveform obliterate with left ulnar compression.  Electronically signed by Ruta Hinds MD on 08/26/2019 at 6:32:06 PM.    Final     Cardiac Studies   Echo 08/26/19: 1. Left ventricular ejection fraction, by estimation, is 40 to 45%. The  left ventricle has mildly decreased function. The left ventricle  demonstrates regional wall motion abnormalities (see scoring  diagram/findings for description). Left ventricular  diastolic parameters are consistent with Grade II diastolic dysfunction  (pseudonormalization). There is moderate hypokinesis of the left  ventricular, mid-apical anteroseptal wall.  2. Right ventricular systolic function is normal. The right ventricular  size is normal.  3. The mitral valve is normal in structure. Mild mitral valve  regurgitation. No evidence of mitral stenosis.  4. The aortic valve is normal in structure. Aortic valve regurgitation is  not visualized. No aortic stenosis is present.   LHC 08/26/19: Diagnostic Dominance: Right   Conclusions: 1. Severe multivessel coronary artery disease, including 90% distal LMCA stenosis, as well as 70-80% lesions involving the mid LAD, D1, large OM1, and rPL branches. 2. Mildly to moderately reduced left ventricular contraction (LVEF ~45%) with anterior hypokinesis.  Upper normal left ventricular filling pressure.  Carotid Doppler 08/26/19: Right Carotid: Velocities in the right ICA are consistent with a 40-59%         stenosis.   Left Carotid: Velocities in  the left ICA are consistent with a 1-39%  stenosis.  Vertebrals: Bilateral vertebral arteries demonstrate antegrade flow.  Subclavians: Normal flow hemodynamics were seen in bilateral subclavian        arteries.  ABIs: Right ABI: Resting right ankle-brachial index is within normal range. No  evidence of significant right lower extremity arterial disease.  Left ABI: Resting left ankle-brachial index is within normal range. No  evidence of significant left lower extremity arterial disease.  Left Upper Extremity: Doppler waveforms decrease >50% with left radial  compression. Doppler waveform obliterate with left ulnar compression.    Patient Profile     54 y.o. male with hypertension, hyperlipidemia, asthma, EtoH abuse, diabetes, and GERD admitted with NSTEMI.  He was found to have multivessel CAD at cath.  Assessment & Plan    # NSTEMI: # s/p CABG: # Hyperlipidemia:  Mr. Houge presented with NSTEMI.  LHC showed multivessel CAD.  He underwent CABG [id number] without complication.  He had a LIMA to the LAD, SVG to D1 sequential to D2, SVG to OM, and sequential SVG to PDA, PL 1, and PL 2.  He is doing very well and stable postoperatively.  Lipids well-controlled.  LDL 58 this admission.  Continue rosuvastatin.  Continue aspirin.  Given his NSTEMI presentation he would benefit from dual antiplatelet therapy once felt safe  by the surgical team.  # Carotid stenosis:  Mild on L, moderate on R.  Aspirin and statin as above.  # Acute systolic and diastolic heart failure:  # Hypertension: LVEF 40-45% on echo this admission.  Inferior Q waves are no longer present on EKG today.  Diuresis per the surgical team.  #DM: Longstanding history of DM.  Hemoglobin A1c 10.8%.   Given systolic dysfunction, consider SGLT2 inhibitor prior to discharge.   # EtOH abuse:  Discussed the importance of limiting to 2/setting and 14/week.  No h/o DT.  Last drink was last weekend.  Lipase normal.    For  questions or updates, please contact [place] Please consult [email or web address] for contact info under        Signed, Skeet Latch, MD  08/28/2019, 10:06 AM

## 2019-08-28 NOTE — Progress Notes (Signed)
1 Day Post-Op Procedure(s) (LRB): CORONARY ARTERY BYPASS GRAFTING (CABG) x7, LIMA TO LAD, SVG TO DIAG 1 WITH SEQUENTIAL SVG TO DIAG 2, SVG TO OM2, SVG TO PDA WITH SEQUENTIAL SVG TO PLB WITH SEQUENTIAL SVG TO OTHER. (N/A) TRANSESOPHAGEAL ECHOCARDIOGRAM (TEE) (N/A) Endovein Harvest Of Greater Saphenous Vein (Right) Subjective: No complaints. Pain under control.  Objective: Vital signs in last 24 hours: Temp:  [97 F (36.1 C)-100.4 F (38 C)] 98.8 F (37.1 C) (05/26 0700) Pulse Rate:  [80-97] 95 (05/26 0700) Cardiac Rhythm: Normal sinus rhythm (05/26 0400) Resp:  [0-32] 23 (05/26 0700) BP: (77-125)/(55-107) 99/69 (05/26 0700) SpO2:  [94 %-100 %] 96 % (05/26 0700) Arterial Line BP: (77-137)/(47-91) 110/54 (05/26 0700) FiO2 (%):  [40 %-50 %] 40 % (05/25 1809) Weight:  [103.1 kg] 103.1 kg (05/26 0500)  Hemodynamic parameters for last 24 hours: PAP: (20-48)/(13-40) 27/15 CO:  [3.4 L/min-6.6 L/min] 6.6 L/min CI:  [1.6 L/min/m2-3.1 L/min/m2] 3.1 L/min/m2  Intake/Output from previous day: 05/25 0701 - 05/26 0700 In: 4795.9 [I.V.:3666.2; Blood:187; IV Piggyback:942.7] Out: [id number]; Chest Tube:655] Intake/Output this shift: No intake/output data recorded.  General appearance: alert and cooperative Neurologic: intact Heart: regular rate and rhythm, S1, S2 normal, no murmur Lungs: clear to auscultation bilaterally Extremities: edema mild Wound: dressings dry  Lab Results: Recent Labs    08/27/19 2100 08/28/19 0412  WBC 11.2* 7.4  HGB 11.0* 10.0*  HCT 32.5* 29.6*  PLT 137* 98*   BMET:  Recent Labs    08/27/19 2100 08/28/19 0412  NA 142 139  K 5.0 4.3  CL 113* 109  CO2 23 20*  GLUCOSE 138* 120*  BUN 15 14  CREATININE 0.85 0.84  CALCIUM 8.0* 8.0*    PT/INR:  Recent Labs    08/27/19 1502  LABPROT 14.9  INR 1.2   ABG    Component Value Date/Time   PHART 7.338 (L) 08/27/2019 1954   HCO3 26.3 08/27/2019 1954   TCO2 28 08/27/2019 1954   ACIDBASEDEF 2.0  08/27/2019 1829   O2SAT 98.0 08/27/2019 1954   CBG (last 3)  Recent Labs    08/28/19 0217 08/28/19 0416 08/28/19 0621  GLUCAP 127* 114* 126*   CXR: clear  ECG: sinus, non-specific changes  Assessment/Plan: S/P Procedure(s) (LRB): CORONARY ARTERY BYPASS GRAFTING (CABG) x7, LIMA TO LAD, SVG TO DIAG 1 WITH SEQUENTIAL SVG TO DIAG 2, SVG TO OM2, SVG TO PDA WITH SEQUENTIAL SVG TO PLB WITH SEQUENTIAL SVG TO OTHER. (N/A) TRANSESOPHAGEAL ECHOCARDIOGRAM (TEE) (N/A) Endovein Harvest Of Greater Saphenous Vein (Right)  POD 1  Hemodynamically stable in sinus rhythm. Continue Lopressor.  Poorly controlled DM with preop Hgb A1c of 10.8 but it has been 9-12 dating back to 2008 in [place]. Start Levemir and SSI. Poor compliance with diet and drinks 12 beers a day per wife.   Volume excess: start diuresis.  DC all chest tubes, swan and arterial line.  IS, OOB, ambulate  Plan to send home on ASA and Plavix with NSTEMI and diffusely diseased diabetic vessels.   LOS: 3 days    Gaye Pollack 08/28/2019

## 2019-08-29 ENCOUNTER — Inpatient Hospital Stay (HOSPITAL_COMMUNITY): Payer: Managed Care, Other (non HMO)

## 2019-08-29 LAB — CBC
HCT: 28.2 % — ABNORMAL LOW (ref 39.0–52.0)
Hemoglobin: 9.6 g/dL — ABNORMAL LOW (ref 13.0–17.0)
MCH: 32.5 pg (ref 26.0–34.0)
MCHC: 34 g/dL (ref 30.0–36.0)
MCV: 95.6 fL (ref 80.0–100.0)
Platelets: 101 10*3/uL — ABNORMAL LOW (ref 150–400)
RBC: 2.95 MIL/uL — ABNORMAL LOW (ref 4.22–5.81)
RDW: 12.2 % (ref 11.5–15.5)
WBC: 9 10*3/uL (ref 4.0–10.5)
nRBC: 0 % (ref 0.0–0.2)

## 2019-08-29 LAB — GLUCOSE, CAPILLARY
Glucose-Capillary: 191 mg/dL — ABNORMAL HIGH (ref 70–99)
Glucose-Capillary: 193 mg/dL — ABNORMAL HIGH (ref 70–99)
Glucose-Capillary: 247 mg/dL — ABNORMAL HIGH (ref 70–99)
Glucose-Capillary: 226 mg/dL — ABNORMAL HIGH (ref 70–99)
Glucose-Capillary: 202 mg/dL — ABNORMAL HIGH (ref 70–99)

## 2019-08-29 LAB — BASIC METABOLIC PANEL
Anion gap: 8 (ref 5–15)
BUN: 14 mg/dL (ref 6–20)
CO2: 26 mmol/L (ref 22–32)
Calcium: 8 mg/dL — ABNORMAL LOW (ref 8.9–10.3)
Chloride: 101 mmol/L (ref 98–111)
Creatinine, Ser: 0.93 mg/dL (ref 0.61–1.24)
GFR calc Af Amer: >60 mL/min (ref >60)
GFR calc non Af Amer: >60 mL/min (ref >60)
Glucose, Bld: 194 mg/dL — ABNORMAL HIGH (ref 70–99)
Potassium: 3.8 mmol/L (ref 3.5–5.1)
Sodium: 135 mmol/L (ref 135–145)

## 2019-08-29 MED ORDER — SODIUM CHLORIDE 0.9% FLUSH
3.0000 mL | Freq: Two times a day (BID) | INTRAVENOUS | Status: DC
  Administered 2019-08-29 – 2019-08-31 (×6): 3 mL via INTRAVENOUS

## 2019-08-29 MED ORDER — ASPIRIN EC 325 MG PO TBEC
325.0000 mg | DELAYED_RELEASE_TABLET | Freq: Every day | ORAL | Status: DC
  Administered 2019-08-29 – 2019-09-01 (×4): 325 mg via ORAL
  Filled 2019-08-29 (×4): qty 1

## 2019-08-29 MED ORDER — CHLORHEXIDINE GLUCONATE CLOTH 2 % EX PADS
6.0000 | MEDICATED_PAD | Freq: Every day | CUTANEOUS | Status: DC
  Administered 2019-08-29: 6 via TOPICAL

## 2019-08-29 MED ORDER — ACETAMINOPHEN 325 MG PO TABS
650.0000 mg | ORAL_TABLET | Freq: Four times a day (QID) | ORAL | Status: DC | PRN
  Administered 2019-08-29 – 2019-09-01 (×6): 650 mg via ORAL
  Filled 2019-08-29 (×7): qty 2

## 2019-08-29 MED ORDER — POTASSIUM CHLORIDE CRYS ER 20 MEQ PO TBCR
20.0000 meq | EXTENDED_RELEASE_TABLET | Freq: Two times a day (BID) | ORAL | Status: DC
  Administered 2019-08-30 – 2019-09-01 (×5): 20 meq via ORAL
  Filled 2019-08-29 (×5): qty 1

## 2019-08-29 MED ORDER — METOPROLOL TARTRATE 12.5 MG HALF TABLET
12.5000 mg | ORAL_TABLET | Freq: Two times a day (BID) | ORAL | Status: DC
  Administered 2019-08-29 (×2): 12.5 mg via ORAL
  Filled 2019-08-29 (×2): qty 1

## 2019-08-29 MED ORDER — SODIUM CHLORIDE 0.9 % IV SOLN: 250.0000 mL | INTRAVENOUS | Status: DC | PRN

## 2019-08-29 MED ORDER — TRAMADOL HCL 50 MG PO TABS: 50.0000 mg | ORAL_TABLET | ORAL | Status: DC | PRN

## 2019-08-29 MED ORDER — BISACODYL 10 MG RE SUPP: 10.0000 mg | Freq: Every day | RECTAL | Status: DC | PRN

## 2019-08-29 MED ORDER — INSULIN DETEMIR 100 UNIT/ML ~~LOC~~ SOLN
25.0000 [IU] | Freq: Two times a day (BID) | SUBCUTANEOUS | Status: DC
  Administered 2019-08-29 (×2): 25 [IU] via SUBCUTANEOUS
  Filled 2019-08-29 (×5): qty 0.25

## 2019-08-29 MED ORDER — FUROSEMIDE 10 MG/ML IJ SOLN
40.0000 mg | Freq: Once | INTRAMUSCULAR | Status: AC
  Administered 2019-08-29: 40 mg via INTRAVENOUS
  Filled 2019-08-29: qty 4

## 2019-08-29 MED ORDER — INSULIN ASPART 100 UNIT/ML ~~LOC~~ SOLN
0.0000 [IU] | Freq: Three times a day (TID) | SUBCUTANEOUS | Status: DC
  Administered 2019-08-29 (×3): 8 [IU] via SUBCUTANEOUS
  Administered 2019-08-30: 4 [IU] via SUBCUTANEOUS
  Administered 2019-08-30 (×3): 8 [IU] via SUBCUTANEOUS
  Administered 2019-08-31: 16 [IU] via SUBCUTANEOUS
  Administered 2019-08-31: 2 [IU] via SUBCUTANEOUS
  Administered 2019-08-31: 12 [IU] via SUBCUTANEOUS
  Administered 2019-08-31: 8 [IU] via SUBCUTANEOUS
  Administered 2019-09-01: 4 [IU] via SUBCUTANEOUS

## 2019-08-29 MED ORDER — SODIUM CHLORIDE 0.9% FLUSH: 3.0000 mL | INTRAVENOUS | Status: DC | PRN

## 2019-08-29 MED ORDER — DOCUSATE SODIUM 100 MG PO CAPS
200.0000 mg | ORAL_CAPSULE | Freq: Every day | ORAL | Status: DC
  Administered 2019-08-29 – 2019-08-31 (×3): 200 mg via ORAL
  Filled 2019-08-29 (×3): qty 2

## 2019-08-29 MED ORDER — BISACODYL 5 MG PO TBEC: 10.0000 mg | DELAYED_RELEASE_TABLET | Freq: Every day | ORAL | Status: DC | PRN

## 2019-08-29 MED ORDER — PANTOPRAZOLE SODIUM 40 MG PO TBEC
40.0000 mg | DELAYED_RELEASE_TABLET | Freq: Every day | ORAL | Status: DC
  Administered 2019-08-29 – 2019-09-01 (×5): 40 mg via ORAL
  Filled 2019-08-29 (×5): qty 1

## 2019-08-29 MED ORDER — ~~LOC~~ CARDIAC SURGERY, PATIENT & FAMILY EDUCATION: Freq: Once | Status: AC

## 2019-08-29 MED ORDER — FUROSEMIDE 40 MG PO TABS
40.0000 mg | ORAL_TABLET | Freq: Every day | ORAL | Status: DC
  Administered 2019-08-30 – 2019-09-01 (×3): 40 mg via ORAL
  Filled 2019-08-29 (×3): qty 1

## 2019-08-29 MED ORDER — POTASSIUM CHLORIDE CRYS ER 20 MEQ PO TBCR
40.0000 meq | EXTENDED_RELEASE_TABLET | Freq: Once | ORAL | Status: AC
  Administered 2019-08-29: 40 meq via ORAL
  Filled 2019-08-29: qty 2

## 2019-08-29 MED ORDER — ONDANSETRON HCL 4 MG/2ML IJ SOLN: 4.0000 mg | Freq: Four times a day (QID) | INTRAMUSCULAR | Status: DC | PRN

## 2019-08-29 MED ORDER — ONDANSETRON HCL 4 MG PO TABS: 4.0000 mg | ORAL_TABLET | Freq: Four times a day (QID) | ORAL | Status: DC | PRN

## 2019-08-29 MED FILL — Heparin Sodium (Porcine) Inj 1000 Unit/ML: INTRAMUSCULAR | Qty: 30 | Status: AC

## 2019-08-29 MED FILL — Sodium Bicarbonate IV Soln 8.4%: INTRAVENOUS | Qty: 50 | Status: AC

## 2019-08-29 MED FILL — Lidocaine HCl Local Soln Prefilled Syringe 100 MG/5ML (2%): INTRAMUSCULAR | Qty: 5 | Status: AC

## 2019-08-29 MED FILL — Albumin, Human Inj 5%: INTRAVENOUS | Qty: 250 | Status: AC

## 2019-08-29 MED FILL — Sodium Chloride IV Soln 0.9%: INTRAVENOUS | Qty: 2000 | Status: AC

## 2019-08-29 MED FILL — Electrolyte-R (PH 7.4) Solution: INTRAVENOUS | Qty: 5000 | Status: AC

## 2019-08-29 MED FILL — Heparin Sodium (Porcine) Inj 1000 Unit/ML: INTRAMUSCULAR | Qty: 10 | Status: AC

## 2019-08-29 MED FILL — Magnesium Sulfate Inj 50%: INTRAMUSCULAR | Qty: 10 | Status: AC

## 2019-08-29 MED FILL — Potassium Chloride Inj 2 mEq/ML: INTRAVENOUS | Qty: 20 | Status: AC

## 2019-08-29 MED FILL — Mannitol IV Soln 20%: INTRAVENOUS | Qty: 500 | Status: AC

## 2019-08-29 MED FILL — Potassium Chloride Inj 2 mEq/ML: INTRAVENOUS | Qty: 40 | Status: AC

## 2019-08-29 NOTE — Progress Notes (Signed)
2 Days Post-Op Procedure(s) (LRB): CORONARY ARTERY BYPASS GRAFTING (CABG) x7, LIMA TO LAD, SVG TO DIAG 1 WITH SEQUENTIAL SVG TO DIAG 2, SVG TO OM2, SVG TO PDA WITH SEQUENTIAL SVG TO PLB WITH SEQUENTIAL SVG TO OTHER. (N/A) TRANSESOPHAGEAL ECHOCARDIOGRAM (TEE) (N/A) Endovein Harvest Of Greater Saphenous Vein (Right) Subjective: No complaints. Pain controlled with Tylenol. Does not want Oxy because it makes him jittery and hot.  Objective: Vital signs in last 24 hours: Temp:  [98.8 F (37.1 C)-99.3 F (37.4 C)] 98.9 F (37.2 C) (05/27 0725) Pulse Rate:  [88-102] 88 (05/27 0700) Cardiac Rhythm: Normal sinus rhythm (05/27 0400) Resp:  [9-38] 20 (05/27 0700) BP: (94-130)/(62-76) 100/63 (05/27 0700) SpO2:  [92 %-100 %] 94 % (05/27 0700) Arterial Line BP: (101-116)/(55-81) 101/75 (05/26 0900) Weight:  [103 kg] 103 kg (05/27 0500)  Hemodynamic parameters for last 24 hours: PAP: (33-36)/(20-22) 36/22  Intake/Output from previous day: 05/26 0701 - 05/27 0700 In: 931.8 [P.O.:720; I.V.:111.9; IV Piggyback:99.9] Out: 3150 [Urine:3110; Chest Tube:40] Intake/Output this shift: No intake/output data recorded.  General appearance: alert and cooperative Neurologic: intact Heart: regular rate and rhythm, S1, S2 normal, no murmur Lungs: clear to auscultation bilaterally Extremities: edema mild Wound: dressings dry  Lab Results: Recent Labs    08/28/19 1635 08/29/19 0415  WBC 8.9 9.0  HGB 9.6* 9.6*  HCT 29.1* 28.2*  PLT 100* 101*   BMET:  Recent Labs    08/28/19 1635 08/29/19 0415  NA 133* 135  K 4.1 3.8  CL 103 101  CO2 21* 26  GLUCOSE 230* 194*  BUN 14 14  CREATININE 0.83 0.93  CALCIUM 7.7* 8.0*    PT/INR:  Recent Labs    08/27/19 1502  LABPROT 14.9  INR 1.2   ABG    Component Value Date/Time   PHART 7.338 (L) 08/27/2019 1954   HCO3 26.3 08/27/2019 1954   TCO2 28 08/27/2019 1954   ACIDBASEDEF 2.0 08/27/2019 1829   O2SAT 98.0 08/27/2019 1954   CBG (last 3)   Recent Labs    08/28/19 2255 08/29/19 0418 08/29/19 0655  GLUCAP 216* 191* 193*   CXR: clear  Assessment/Plan: S/P Procedure(s) (LRB): CORONARY ARTERY BYPASS GRAFTING (CABG) x7, LIMA TO LAD, SVG TO DIAG 1 WITH SEQUENTIAL SVG TO DIAG 2, SVG TO OM2, SVG TO PDA WITH SEQUENTIAL SVG TO PLB WITH SEQUENTIAL SVG TO OTHER. (N/A) TRANSESOPHAGEAL ECHOCARDIOGRAM (TEE) (N/A) Endovein Harvest Of Greater Saphenous Vein (Right)  POD 2 Hemodynamically stable in sinus rhythm. Continue Lopressor.  Poorly controlled DM with preop Hgb A1c of 10.8 but it has been 9-12 dating back to 2008 in [place]. Glucose overnight 190's so will increase Levemir to 25 bid and continue SSI. Poor compliance with diet at home and drinks 12 beers a day per wife.   Volume excess: diuresed -2200 cc yesterday but weight unchanged. Probably inaccurate. Will give IV lasix this am and continue po tomorrow.  DC sleeve and foley  IS, OOB, ambulate  Plan to send home on ASA and Plavix with NSTEMI and diffusely diseased diabetic vessels.  Transfer to 4E today.   LOS: 4 days    Gaye Pollack 08/29/2019

## 2019-08-29 NOTE — Progress Notes (Signed)
Anesthesiology Follow-up:  54 year old male 2 days S/P CABG X 7  Awake and alert hemodynamically stable in SR  VS: T-37.2 BP- 98/67 HR- 88 (SR) RR- 21 O2 sat 98% on RA  K- 3.8 BUN/Cr.- 14/0.93 glucose- 194 H/H- 9.6/28.2 Platelets- 101,000  Extubated 31/2 hours post-op  Stable post-op course, poorly controlled DM as noted. No apparent anesthetic complications  [name] Gaudy

## 2019-08-29 NOTE — Progress Notes (Signed)
CARDIAC REHAB PHASE I   PRE:  Rate/Rhythm: 91 SR  BP:  Supine: 102/71  Sitting:   Standing:    SaO2: 94%RA  MODE:  Ambulation: 370 ft   POST:  Rate/Rhythm: 107 ST  BP:  Supine: 130/78  Sitting:   Standing:    SaO2: 99%RA 1340-1410 Pt walked 370 ft on RA adhering to sternal precautions. Used rolling walker and asst x 1. Stopped a couple of times to catch his breath. Sats good on RA.  Back to bed after walk to nap as did not sleep well last night.    Graylon Good, RN BSN  08/29/2019 2:13 PM

## 2019-08-29 NOTE — Progress Notes (Signed)
Pt transferred to 4E-20 via wheelchair from [facility]. Pt walked to chair. Pt given CHG bath. Tele applied, CCMD notified. Pt oriented to call bell, bed and room. Call bell within reach. VSS. Will continue to monitor.  Amanda Cockayne, RN

## 2019-08-30 ENCOUNTER — Telehealth: Payer: Self-pay

## 2019-08-30 LAB — BASIC METABOLIC PANEL
Anion gap: 9 (ref 5–15)
BUN: 15 mg/dL (ref 6–20)
CO2: 26 mmol/L (ref 22–32)
Calcium: 9 mg/dL (ref 8.9–10.3)
Chloride: 101 mmol/L (ref 98–111)
Creatinine, Ser: 0.93 mg/dL (ref 0.61–1.24)
GFR calc Af Amer: >60 mL/min (ref >60)
GFR calc non Af Amer: >60 mL/min (ref >60)
Glucose, Bld: 235 mg/dL — ABNORMAL HIGH (ref 70–99)
Potassium: 4.3 mmol/L (ref 3.5–5.1)
Sodium: 136 mmol/L (ref 135–145)

## 2019-08-30 LAB — GLUCOSE, CAPILLARY
Glucose-Capillary: 185 mg/dL — ABNORMAL HIGH (ref 70–99)
Glucose-Capillary: 227 mg/dL — ABNORMAL HIGH (ref 70–99)
Glucose-Capillary: 219 mg/dL — ABNORMAL HIGH (ref 70–99)
Glucose-Capillary: 220 mg/dL — ABNORMAL HIGH (ref 70–99)

## 2019-08-30 LAB — CBC
HCT: 29.7 % — ABNORMAL LOW (ref 39.0–52.0)
Hemoglobin: 10.2 g/dL — ABNORMAL LOW (ref 13.0–17.0)
MCH: 32.5 pg (ref 26.0–34.0)
MCHC: 34.3 g/dL (ref 30.0–36.0)
MCV: 94.6 fL (ref 80.0–100.0)
Platelets: 152 10*3/uL (ref 150–400)
RBC: 3.14 MIL/uL — ABNORMAL LOW (ref 4.22–5.81)
RDW: 12 % (ref 11.5–15.5)
WBC: 9 10*3/uL (ref 4.0–10.5)
nRBC: 0 % (ref 0.0–0.2)

## 2019-08-30 MED ORDER — FREESTYLE LIBRE 2 SENSOR MISC: 6 refills | Status: AC

## 2019-08-30 MED ORDER — FREESTYLE LIBRE 2 READER DEVI: 6 refills | Status: DC

## 2019-08-30 MED ORDER — INSULIN DETEMIR 100 UNIT/ML ~~LOC~~ SOLN
28.0000 [IU] | Freq: Two times a day (BID) | SUBCUTANEOUS | Status: DC
  Administered 2019-08-30 – 2019-08-31 (×3): 28 [IU] via SUBCUTANEOUS
  Filled 2019-08-30 (×4): qty 0.28

## 2019-08-30 MED ORDER — METOPROLOL TARTRATE 25 MG PO TABS
25.0000 mg | ORAL_TABLET | Freq: Two times a day (BID) | ORAL | Status: DC
  Administered 2019-08-30 – 2019-09-01 (×5): 25 mg via ORAL
  Filled 2019-08-30 (×5): qty 1

## 2019-08-30 MED ORDER — LACTULOSE 10 GM/15ML PO SOLN: 20.0000 g | Freq: Once | ORAL | Status: DC

## 2019-08-30 MED ORDER — INSULIN ASPART 100 UNIT/ML ~~LOC~~ SOLN
6.0000 [IU] | Freq: Three times a day (TID) | SUBCUTANEOUS | Status: DC
  Administered 2019-08-30 – 2019-09-01 (×6): 6 [IU] via SUBCUTANEOUS

## 2019-08-30 NOTE — Progress Notes (Signed)
CARDIAC REHAB PHASE I   PRE:  Rate/Rhythm: ST 102  BP:  Sitting: 133/84        SaO2: 98% RA  MODE:  Ambulation: 470 ft ST 120  Sao2: 97% RA  POST:  Rate/Rhythm: ST 118   BP:  Sitting: 156/95       SaO2: 99% RA  10:55 - 11:23  Pt received in chair. Pt agrees to walk. Voices he has walked this morning. He ambulated with strong, steady gait. Pt has no complaints and was placed back to recliner. Call bell in reach. I/S encouraged 10x/hr. I/S= 2500 mL.  Will attempt to do post-op CABG education this afternoon when spouse arrives.   Lesly Rubenstein, MS, ACSM EP-C, [facility] 08/30/2019  11:16 AM

## 2019-08-30 NOTE — Progress Notes (Signed)
Inpatient Diabetes Program Recommendations  AACE/ADA: New Consensus Statement on Inpatient Glycemic Control (2015)  Target Ranges:  Prepandial:   less than 140 mg/dL      Peak postprandial:   less than 180 mg/dL (1-2 hours)      Critically ill patients:  140 - 180 mg/dL   Lab Results  Component Value Date   GLUCAP 227 (H) 08/30/2019   HGBA1C 10.8 (H) 08/25/2019    Review of Glycemic Control  Diabetes history: DM 2 Outpatient Diabetes medications: Novolin 70/30 ([employer]) 40 units qam, 20 units qpm, metformin 1000 mg bid Current orders for Inpatient glycemic control:  Levemir 28 units bid Novolog 0-24 units tid and hs Novolog 6 units tid  Inpatient Diabetes Program Recommendations:    Spoke with pt ad wife at bedside to reinforce education. Pt interested in the freestyle libre 2 device and sensor for glucose checks. Paged Tacy Dura, PA to place orders and gave to pt to take home to place when he arrives at home. Discussed how to apply sensor and trouble shooting.  Patient had been on Jardiance in the past. [place] to get his copay card information to use if Vania Rea is ordered at time of d/c.  Gave a list of local Endocrinologists in the area. Pt uses [facility].  Pt and wife des not have questions at this time.  Thanks,  Tama Headings RN, MSN, BC-ADM Inpatient Diabetes Coordinator Team Pager [phone] (8a-5p)

## 2019-08-30 NOTE — Progress Notes (Addendum)
[address]       [place] [address]             [phone]        3 Days Post-Op Procedure(s) (LRB): CORONARY ARTERY BYPASS GRAFTING (CABG) x7, LIMA TO LAD, SVG TO DIAG 1 WITH SEQUENTIAL SVG TO DIAG 2, SVG TO OM2, SVG TO PDA WITH SEQUENTIAL SVG TO PLB WITH SEQUENTIAL SVG TO OTHER. (N/A) TRANSESOPHAGEAL ECHOCARDIOGRAM (TEE) (N/A) Endovein Harvest Of Greater Saphenous Vein (Right)  Subjective: Patient had a good breakfast;he has not had much appetite but better this am. He is passing flatus but no bowel movement yet.  Objective: Vital signs in last 24 hours: Temp:  [98.5 F (36.9 C)-99.9 F (37.7 C)] 99.3 F (37.4 C) (05/28 0503) Pulse Rate:  [83-96] 96 (05/27 1614) Cardiac Rhythm: Normal sinus rhythm (05/28 0503) Resp:  [16-29] 16 (05/28 0503) BP: (87-151)/(55-90) 151/90 (05/28 0503) SpO2:  [85 %-100 %] 97 % (05/28 0503) Weight:  [101.4 kg] 101.4 kg (05/28 0500)  Pre op weight 98.3 kg Current Weight  08/30/19 101.4 kg       Intake/Output from previous day: 05/27 0701 - 05/28 0700 In: 903.1 [P.O.:840; I.V.:63.1] Out: 1520 [Urine:1520]   Physical Exam:  Cardiovascular: Slightly tachycardic Pulmonary: Diminished bibasilar breath sounds Abdomen: Soft, non tender, bowel sounds present. Extremities: Mild bilateral lower extremity edema. Wounds: Dressing removed and sternal wound is clean and dry.  No erythema or signs of infection. RLE wound is clean and dry  Lab Results: CBC: Recent Labs    08/28/19 1635 08/29/19 0415  WBC 8.9 9.0  HGB 9.6* 9.6*  HCT 29.1* 28.2*  PLT 100* 101*   BMET:  Recent Labs    08/28/19 1635 08/29/19 0415  NA 133* 135  K 4.1 3.8  CL 103 101  CO2 21* 26  GLUCOSE 230* 194*  BUN 14 14  CREATININE 0.83 0.93  CALCIUM 7.7* 8.0*    PT/INR:  Lab Results  Component Value Date   INR 1.2 08/27/2019   INR 1.0 08/26/2019   ABG:  INR: Will add last result for INR, ABG once components are confirmed Will  add last 4 CBG results once components are confirmed  Assessment/Plan:  1. CV - First degree heart block,ST at times and hypertensive this am. On Lopressor 12.5 mg bid but will increase for better HR/BP control. 2.  Pulmonary - On room air. PA/LAT CXR to be ordered for am. Encourage incentive spirometer. 3. Volume Overload - On Lasix 40 mg daily 4.  Expected post op acute blood loss anemia - H and H yesterday 9.6 and 28.2 5. DM-CBGs 226/202/185. On Insulin but will increase for better glucose control. Pre op HGA1C 10.8. He will need medical follow up after discharge. 6. Hypothyroidism-continue Levothyroxine 7. Thrombocytopenia-platelets this am 101,000 8. Await this am's labs  Donielle M ZimmermanPA-C 08/30/2019,7:11 AM   Chart reviewed, patient examined, agree with above. He is doing well overall.  Glucose is still too high. Levemir increased and will add meal coverage Novolog to SSI. He was on Metformin XR preop at home but said he frequently did not take it because it bothered his stomach. He was on 70/30 insulin but says he was not taking it consistently. He obviously needs to get his act together taking his meds, eating well, not drinking beer and getting regular exercise if he is going to control diabetes adequately. I discussed the detrimental effects of poorly controlled DM with  him. I would send him home on previous meds and he will have to follow up with his PCP or consider finding an endocrinologist.   He will probably be ready to go home Sunday.

## 2019-08-30 NOTE — Progress Notes (Signed)
13:30 - 14:26  Education done with patient and spouse S/P CABG x 7. Reviewed "move in the tube" for sternal precautions. Discussed bathing, wound care, S/S to report to MD, and weight restrictions. Encouraged continued use of  I/S. Stressed compliance with medications and f/u appointments with physicians. Reviewed/discussed handouts on Low Na, heart healthy, and diabetic diet. Encouraged to monitor blood sugars. Discussed consumption of ETOH  and encouraged to follow the recomendations of his physician on acceptable amount. Reviewed walking program at home and weather parameters and s/s that require termination of exercise. Also discussed CRP2 @ [facility], pt voices interest in participating. A referral to [facility] CRP2 has been entered. Pt and spouse verbalized understanding of education provided.   Lesly Rubenstein MS, ACSM-EP-C, CCRP

## 2019-08-30 NOTE — Telephone Encounter (Signed)
Faxed completed Critical Illness form to [employer] [facility] [phone] for Debe Coder

## 2019-08-31 ENCOUNTER — Inpatient Hospital Stay (HOSPITAL_COMMUNITY): Payer: Managed Care, Other (non HMO)

## 2019-08-31 DIAGNOSIS — Z951 Presence of aortocoronary bypass graft: Secondary | ICD-10-CM

## 2019-08-31 LAB — GLUCOSE, CAPILLARY
Glucose-Capillary: 150 mg/dL — ABNORMAL HIGH (ref 70–99)
Glucose-Capillary: 300 mg/dL — ABNORMAL HIGH (ref 70–99)
Glucose-Capillary: 315 mg/dL — ABNORMAL HIGH (ref 70–99)
Glucose-Capillary: 228 mg/dL — ABNORMAL HIGH (ref 70–99)

## 2019-08-31 MED ORDER — INSULIN DETEMIR 100 UNIT/ML ~~LOC~~ SOLN
30.0000 [IU] | Freq: Two times a day (BID) | SUBCUTANEOUS | Status: DC
  Administered 2019-08-31 – 2019-09-01 (×2): 30 [IU] via SUBCUTANEOUS
  Filled 2019-08-31 (×3): qty 0.3

## 2019-08-31 NOTE — Progress Notes (Addendum)
      [address]       [place] [address]             [phone]      4 Days Post-Op Procedure(s) (LRB): CORONARY ARTERY BYPASS GRAFTING (CABG) x7, LIMA TO LAD, SVG TO DIAG 1 WITH SEQUENTIAL SVG TO DIAG 2, SVG TO OM2, SVG TO PDA WITH SEQUENTIAL SVG TO PLB WITH SEQUENTIAL SVG TO OTHER. (N/A) TRANSESOPHAGEAL ECHOCARDIOGRAM (TEE) (N/A) Endovein Harvest Of Greater Saphenous Vein (Right) Subjective: Feels good this morning. Ambulating around the room.   Objective: Vital signs in last 24 hours: Temp:  [98.6 F (37 C)-99.7 F (37.6 C)] 98.6 F (37 C) (05/29 0735) Pulse Rate:  [83-104] 91 (05/29 0449) Cardiac Rhythm: Normal sinus rhythm (05/29 0449) Resp:  [16-22] 18 (05/29 0735) BP: (109-145)/(65-93) 145/93 (05/29 0735) SpO2:  [94 %-99 %] 99 % (05/29 0735) Weight:  [98.7 kg] 98.7 kg (05/29 0449)     Intake/Output from previous day: 05/28 0701 - 05/29 0700 In: 716 [P.O.:716] Out: -  Intake/Output this shift: No intake/output data recorded.  General appearance: alert, cooperative and no distress Heart: regular rate and rhythm, S1, S2 normal, no murmur, click, rub or gallop Lungs: clear to auscultation bilaterally Abdomen: soft, non-tender; bowel sounds normal; no masses,  no organomegaly Extremities: extremities normal, atraumatic, no cyanosis or edema Wound: clean and dry  Lab Results: Recent Labs    08/29/19 0415 08/30/19 1123  WBC 9.0 9.0  HGB 9.6* 10.2*  HCT 28.2* 29.7*  PLT 101* 152   BMET:  Recent Labs    08/29/19 0415 08/30/19 1123  NA 135 136  K 3.8 4.3  CL 101 101  CO2 26 26  GLUCOSE 194* 235*  BUN 14 15  CREATININE 0.93 0.93  CALCIUM 8.0* 9.0    PT/INR: No results for input(s): LABPROT, INR in the last 72 hours. ABG    Component Value Date/Time   PHART 7.338 (L) 08/27/2019 1954   HCO3 26.3 08/27/2019 1954   TCO2 28 08/27/2019 1954   ACIDBASEDEF 2.0 08/27/2019 1829   O2SAT 98.0 08/27/2019 1954   CBG (last 3)  Recent  Labs    08/30/19 1615 08/30/19 2137 08/31/19 0613  GLUCAP 219* 220* 150*    Assessment/Plan: S/P Procedure(s) (LRB): CORONARY ARTERY BYPASS GRAFTING (CABG) x7, LIMA TO LAD, SVG TO DIAG 1 WITH SEQUENTIAL SVG TO DIAG 2, SVG TO OM2, SVG TO PDA WITH SEQUENTIAL SVG TO PLB WITH SEQUENTIAL SVG TO OTHER. (N/A) TRANSESOPHAGEAL ECHOCARDIOGRAM (TEE) (N/A) Endovein Harvest Of Greater Saphenous Vein (Right)  1. ST in the low 100s, BP controlled. Continue lopressor 2. Pulm-tolerating room air with excellent oxygenation. CXR showed no pulmonary edema, mild left atelectasis 3. Renal-volume overload. Continue lasix daily. Creatinine 0.93, electrolytes okay 4. H and H- 10.2/29.7, expected acute blood loss anemia 5. Endo-High CBGs over 200. Will increase Levemir to 30U.  6. Thrombocytopenia resolved-platelets 152k Hypothyroidism-continue Levothyroxine  Plan: Increased Levemir, list of endocrinologists provided to the patient yesterday and he knows he needs close follow-up for his diabetes. Hopefully can discharge tomorrow. EPW out today.    LOS: 6 days    Elgie Collard 08/31/2019  Agree with above Doing well Home tomorrow Lajuana Matte

## 2019-08-31 NOTE — Progress Notes (Signed)
EPW's removed per MD order without difficulty.  Will continue to monitor.

## 2019-08-31 NOTE — Progress Notes (Signed)
CARDIAC REHAB PHASE I   1330 CR RN in to walk with patient. Patient states he has walked several times today, wishes to take a nap at this time as visitors have just left. Patient states he will walk again in the hallway upon awakening.   Megham Dwyer Minus Breeding RN, BSN

## 2019-09-01 LAB — GLUCOSE, CAPILLARY
Glucose-Capillary: 189 mg/dL — ABNORMAL HIGH (ref 70–99)
Glucose-Capillary: 230 mg/dL — ABNORMAL HIGH (ref 70–99)

## 2019-09-01 MED ORDER — ACETAMINOPHEN 325 MG PO TABS: 650.0000 mg | ORAL_TABLET | Freq: Four times a day (QID) | ORAL | Status: DC | PRN

## 2019-09-01 MED ORDER — TRAMADOL HCL 50 MG PO TABS: 50.0000 mg | ORAL_TABLET | Freq: Four times a day (QID) | ORAL | 0 refills | Status: DC | PRN

## 2019-09-01 MED ORDER — METOPROLOL TARTRATE 25 MG PO TABS: 25.0000 mg | ORAL_TABLET | Freq: Two times a day (BID) | ORAL | 1 refills | Status: DC

## 2019-09-01 MED ORDER — ASPIRIN 325 MG PO TBEC: 325.0000 mg | DELAYED_RELEASE_TABLET | Freq: Every day | ORAL | 0 refills | Status: DC

## 2019-09-01 NOTE — Progress Notes (Signed)
      [address]       [place] [address]             [phone]      5 Days Post-Op Procedure(s) (LRB): CORONARY ARTERY BYPASS GRAFTING (CABG) x7, LIMA TO LAD, SVG TO DIAG 1 WITH SEQUENTIAL SVG TO DIAG 2, SVG TO OM2, SVG TO PDA WITH SEQUENTIAL SVG TO PLB WITH SEQUENTIAL SVG TO OTHER. (N/A) TRANSESOPHAGEAL ECHOCARDIOGRAM (TEE) (N/A) Endovein Harvest Of Greater Saphenous Vein (Right) Subjective: Feels good this morning. Looking forward to sleeping in his own bed.   Objective: Vital signs in last 24 hours: Temp:  [97.7 F (36.5 C)-98.9 F (37.2 C)] 98.9 F (37.2 C) (05/30 0733) Pulse Rate:  [81-95] 95 (05/30 0733) Cardiac Rhythm: Sinus tachycardia (05/29 2018) Resp:  [17-22] 17 (05/30 0733) BP: (103-146)/(64-92) 132/82 (05/30 0733) SpO2:  [95 %-99 %] 97 % (05/30 0733) Weight:  [96.3 kg] 96.3 kg (05/30 0432)     Intake/Output from previous day: 05/29 0701 - 05/30 0700 In: 480 [P.O.:480] Out: -  Intake/Output this shift: No intake/output data recorded.  General appearance: alert, cooperative and no distress Heart: regular rate and rhythm, S1, S2 normal, no murmur, click, rub or gallop Lungs: clear to auscultation bilaterally Abdomen: soft, non-tender; bowel sounds normal; no masses,  no organomegaly Extremities: extremities normal, atraumatic, no cyanosis or edema Wound: clean and dry  Lab Results: Recent Labs    08/30/19 1123  WBC 9.0  HGB 10.2*  HCT 29.7*  PLT 152   BMET:  Recent Labs    08/30/19 1123  NA 136  K 4.3  CL 101  CO2 26  GLUCOSE 235*  BUN 15  CREATININE 0.93  CALCIUM 9.0    PT/INR: No results for input(s): LABPROT, INR in the last 72 hours. ABG    Component Value Date/Time   PHART 7.338 (L) 08/27/2019 1954   HCO3 26.3 08/27/2019 1954   TCO2 28 08/27/2019 1954   ACIDBASEDEF 2.0 08/27/2019 1829   O2SAT 98.0 08/27/2019 1954   CBG (last 3)  Recent Labs    08/31/19 1647 08/31/19 2146 09/01/19 0610  GLUCAP 315*  228* 189*    Assessment/Plan: S/P Procedure(s) (LRB): CORONARY ARTERY BYPASS GRAFTING (CABG) x7, LIMA TO LAD, SVG TO DIAG 1 WITH SEQUENTIAL SVG TO DIAG 2, SVG TO OM2, SVG TO PDA WITH SEQUENTIAL SVG TO PLB WITH SEQUENTIAL SVG TO OTHER. (N/A) TRANSESOPHAGEAL ECHOCARDIOGRAM (TEE) (N/A) Endovein Harvest Of Greater Saphenous Vein (Right)  1. NSR in the 90s, BP controlled. Continue lopressor 2. Pulm-tolerating room air with excellent oxygenation. CXR showed no pulmonary edema, mild left atelectasis 3. Renal-volume overload. Continue lasix daily. Creatinine 0.93, electrolytes okay 4. H and H- 10.2/29.7, expected acute blood loss anemia 5. Endo-High CBGs over 200. Will increase Levemir to 30U.  6. Thrombocytopenia resolved-platelets 152k 7. Hypothyroidism-continue Levothyroxine  Plan: Discharge today. Will follow-up with an endocrinology outpatient for better glucose control.     LOS: 7 days    Elgie Collard 09/01/2019

## 2019-09-01 NOTE — Progress Notes (Signed)
Pt walked halls independently this am, approx 2000 ft. Tolerated well.

## 2019-09-03 ENCOUNTER — Ambulatory Visit: Payer: Managed Care, Other (non HMO) | Admitting: Adult Health

## 2019-09-04 ENCOUNTER — Other Ambulatory Visit: Payer: Self-pay | Admitting: Internal Medicine

## 2019-09-05 DIAGNOSIS — E1169 Type 2 diabetes mellitus with other specified complication: Secondary | ICD-10-CM | POA: Insufficient documentation

## 2019-09-05 NOTE — Progress Notes (Addendum)
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 mild ST elevation vs residual T wave abnormalities compared to last prior to discharge on 08/28/19- unfortunately this printout was given to EMS in error Placed on 2L O2 [place] Departed to [facility] ED accompanied by EMS and wife   Labs today deferred as patient departed by EMS to ED  Hypertension  Pending ED workup anticipate needs to reduce lopressor from 25 mg BID to 12.5 mg BID- needs to check BP prior to taking Monitor blood pressure at home; call if consistently over 130/80 Continue DASH diet.   Reminder to go to the ER if any CP, SOB, nausea, dizziness, severe HA, changes vision/speech, left arm numbness and tingling and jaw pain.  Cholesterol  rosuvastatin 40 mg, consider nexlizet/nexlitol for LDL goal if needed Reviewed LDL goal <70 Continue low cholesterol diet and exercise.  Check lipid panel.   Diabetes with diabetic chronic kidney disease Now managed by Dr. Posey Pronto at [facility] Medications: metformin, newly on jardiance, off of novolin 70/30 and newly prescribed tresiba 30 units daily, hasn't started - concerned about possible hypoglycemia due to poor oral intake, advised start 10-15 units at first only if eating and slowly titrate up 2-3 unites every few days if fasting remains 130+  Discussed low glucose overnight is more dangerous than high; call with any concerns or hypoglycemia Discussed disease and risks Discussed diet/exercise, weight management  A1C  Hypothyroidism continue medications the same pending lab results reminded to take on an empty stomach 30-10mns before food.  check TSH  level  Vitamin D Def Continue supplementation for goal of 60-100  GERD/bloating/flatulence ? Poorly controlled GERD, no improvement with BID omeprazole 40 mg, simethicone, other conservative interventions, recommend he call established GI provider for possible EGD.  Discussed diet, avoiding triggers and other lifestyle changes  Depression/anxiety Continue medications  Lifestyle discussed: diet/exerise, sleep hygiene, stress management, hydration  OSA Patient departed prior to discussing - discuss compliance at follow up appointment.  Overweight Long discussion about weight loss, diet, and exercise Recommended diet heavy in fruits and veggies and low in animal meats, cheeses, and dairy products, appropriate calorie intake Discuss appropriate weight for height  Follow up at next visit   Continue diet and meds as discussed. Further disposition pending results of labs. Discussed med's effects and SE's.    Future Appointments  Date Time Provider DApplewold 09/10/2019 11:00 AM [facility] [place] NURSE [facility]  09/16/2019 10:45 AM LLendon Colonel NP [facility] 10/02/2019 12:30 PM BGaye Pollack MD [facility]  12/04/2019  9:30 AM MUnk Pinto MD [facility] None  06/04/2020 10:00 AM MUnk Pinto MD [facility] None     HPI BP (!) 86/64   Pulse 91   Temp (!) 96.4 F (35.8 C)   Wt 207 lb (93.9 kg)   SpO2 96%   BMI 29.70 kg/m   54y.o. male  presents for 3 month follow up with hypertension, hyperlipidemia, diabetes, GERD, depression, hypothyroid and vitamin D deficiency.  He recently was admitted for ACS, found to have non-STEMI, cath by Dr. ESaunders Revelshowed 90% distal left main coronary stenosis with 70 to 80% stenoses involving the LAD, diagonal, large obtuse marginal, and  right coronary artery branches. Left ventricular ejection fraction was reduced to 45% with anterior hypokinesis. LVEDP was 14 mmHg. 2D echocardiogram confirm ejection fraction  of 40 to 45% with moderate hypokinesis of the mid apical anteroseptal wall with grade 2 diastolic dysfunction. Pre operative carotid duplex US showed no significant left internal carotid artery stenosis and a 40-59% right internal carotid artery stenosis.  Underwent CABG x 7 by Dr. Cyndia Bent and did well, chest tumes were removed without complication and tolerated initiation of cardiac rehab prior to discharge.   ASA was increased from 81 to 325 mg daily, hyzaar d/c'd, on lopressor 25 mg BID, rosuvastatin 40 mg daily.   Today he presents accompanied by his wife; he appears very pale, cold/clammy, diaphoretic, appears anxious, hypotensive. Very difficult to obtain BP, ? 70/40s initially, then 86/64 manual by provider. Patient states hasn't been checking BPs at home, has been taking lopressor 25 mg BID. Has been feeling somewhat fatigued since discharge, but sweating, etc started this AM, new. These sx are present at rest.   He has a history of Combined Systolic and Diastolic Denies dyspnea on exertion, orthopnea, paroxysmal nocturnal dyspnea and edema. Positive for fatigue and near-syncope. Wt Readings from Last 3 Encounters:  09/06/19 207 lb (93.9 kg)  09/06/19 207 lb (93.9 kg)  09/01/19 212 lb 3.2 oz (96.3 kg)   he has a diagnosis of depression and was been on on celexa 40 mg daily with well controlled sx.   he has a diagnosis of GERD which is currently managed by omeprazole 40 mg BID he denies reflux, burning, hoarseness, cough, but reports chronic progressive belching, bloating, abdominal distension, flatulence that is progressive and ongoing over the last 6+ months, has tried simethicone, increased reflux medication without benefit. Denies abd pain, constipation/diarrhea, black stools.   BMI is Body mass index is 29.7 kg/m., he has not been working on diet and exercise, is generally active in his yardwork and going [place] most weekends.  He avoids sweet drinks Wt Readings from Last 3  Encounters:  09/06/19 207 lb (93.9 kg)  09/06/19 207 lb (93.9 kg)  09/01/19 212 lb 3.2 oz (96.3 kg)   He has ectatic abdominal aorta Per abd [place] 10/2018; recommended 5 year follow up US - high risk progression to AAA.  He does not currently check BP at home, today their BP is BP: (!) 86/64  He does not workout. He denies chest pain, shortness of breath, endorses    He is on cholesterol medication (atorvastatin 80 mg daily) and denies myalgias. His cholesterol is not at goal. The cholesterol last visit was:   Lab Results  Component Value Date   CHOL 122 08/26/2019   HDL 41 08/26/2019   LDLCALC 58 08/26/2019   LDLDIRECT 120.3 11/07/2006   TRIG 116 08/26/2019   CHOLHDL 3.0 08/26/2019    He has not been working on diet and exercise for T2DM. He is newly established with endocrinologist Dr. Posey Pronto at [facility], seen yesterday and reports had A1C done which was 9.6%. He was previously on metformin 1000 mg BID, novolin 70/30 BID but was poorly compliant with diet and recommendations with consequently poorly controlled values. Yesterday Dr. Posey Pronto changed plan of care to metformin 2000 mg, jardiance 25, tresiba 30 units day. He reports hasn't started tresiba yet today as he hasn't eaten. He was recently set up with freestyle libre, checked glucose at this time and was 122.   He denies hypoglycemia , increased appetite, nausea, paresthesia of the  feet, polydipsia, polyuria and visual disturbances.   Last A1C in the office was:  Lab Results  Component Value Date   HGBA1C 10.8 (H) 08/25/2019    Last GFR:  Lab Results  Component Value Date   GFRNONAA >60 08/30/2019   Patient has been on thyroid Replacement since thyroidectomy for goiter in 2002. His medication was not changed last visit. Taking levothyroxine 137 mcg, daily, no recent dose changes.   Lab Results  Component Value Date   TSH 4.50 05/31/2019   Patient is on Vitamin D supplement, was out at the last visit, reports taking daily  now Lab Results  Component Value Date   VD25OH 26 (L) 05/31/2019       Current Medications:  Current Outpatient Medications on File Prior to Visit  Medication Sig Dispense Refill  . acetaminophen (TYLENOL) 325 MG tablet Take 2 tablets (650 mg total) by mouth every 6 (six) hours as needed for mild pain.    Marland Kitchen aspirin EC 325 MG EC tablet Take 1 tablet (325 mg total) by mouth daily. 30 tablet 0  . blood glucose meter kit and supplies KIT Dispense based on patient and insurance preference. Use up to 3 times daily as directed. (FOR ICD-10-E11.9 and Z79.4). (Patient taking differently: Inject 1 each into the skin See admin instructions. Dispense based on patient and insurance preference. Use up to 3 times daily as directed. (FOR ICD-10-E11.9 and Z79.4).) 1 each 0  . citalopram (CELEXA) 40 MG tablet Take 1 tablet Daily for Mood (Patient taking differently: Take 40 mg by mouth daily. ) 90 tablet 0  . Continuous Blood Gluc Receiver (FREESTYLE LIBRE 2 READER) DEVI 1 each with 6 refills. Use as directed (Patient taking differently: 1 each by Other route as directed. ) 1 each 6  . Continuous Blood Gluc Sensor (FREESTYLE LIBRE 2 SENSOR) MISC Please supply one box and 6 refills. Use as directed (Patient taking differently: 1 each by Other route See admin instructions. Please supply one box and 6 refills. Use as directed) 1 each 6  . empagliflozin (JARDIANCE) 25 MG TABS tablet Take 25 mg by mouth daily.     . insulin degludec (TRESIBA FLEXTOUCH) 100 UNIT/ML FlexTouch Pen Inject 30 Units into the skin daily.    Marland Kitchen levothyroxine (SYNTHROID) 137 MCG tablet Take 1 tablet daily on an empty stomach with only water for 30 minutes & no Antacid meds, Calcium or Magnesium for 4 hours & avoid Biotin (Patient taking differently: Take 137 mcg by mouth daily before breakfast. ) 90 tablet 0  . metFORMIN (GLUCOPHAGE-XR) 500 MG 24 hr tablet Take 2 tablets 2 x  /day with Meals for Diabetes (Patient taking differently: Take 1,000 mg  by mouth 2 (two) times daily with a meal. ) 360 tablet 0  . metoprolol tartrate (LOPRESSOR) 25 MG tablet Take 1 tablet (25 mg total) by mouth 2 (two) times daily. 60 tablet 1  . omeprazole (PRILOSEC) 40 MG capsule Take 1 capsule Daily for Indigestion & Reflux (Patient taking differently: Take 40 mg by mouth daily. ) 30 capsule 3  . rosuvastatin (CRESTOR) 40 MG tablet Take 1 tablet Daily for Cholesterol (Patient taking differently: Take 40 mg by mouth daily. ) 90 tablet 0  . traMADol (ULTRAM) 50 MG tablet Take 1 tablet (50 mg total) by mouth every 6 (six) hours as needed for moderate pain. 30 tablet 0  . VITAMIN D PO Take 10,000 Units by mouth daily.      No current facility-administered  medications on file prior to visit.   Medical History:  Past Medical History:  Diagnosis Date  . Anxiety   . ASTHMA 10/28/2006  . Coronary artery disease   . Cough   . Diabetes mellitus type II 2000   age 22  . GERD (gastroesophageal reflux disease)   . Hyperlipidemia   . Hypertension 1995  . Hypothyroidism 2002   thyroidectomy for Goiter  . Lumbar back pain   . NASH (nonalcoholic steatohepatitis)   . Numbness   . OSA (obstructive sleep apnea) 11/19/2010  . Sleep apnea    Pt states mild case, doesn't use cpap   Allergies: No Known Allergies   Review of Systems:  Review of Systems  Constitutional: Negative for chills, diaphoresis, fever, malaise/fatigue and weight loss.  HENT: Negative for congestion, ear pain, hearing loss, sinus pain, sore throat and tinnitus.   Eyes: Negative.   Respiratory: Negative for cough, shortness of breath and wheezing.   Cardiovascular: Negative for chest pain, palpitations and leg swelling.  Gastrointestinal: Negative for blood in stool, constipation, diarrhea, heartburn, melena, nausea and vomiting.  Genitourinary: Negative for urgency.  Neurological: Negative for dizziness, tremors, focal weakness and headaches.  Psychiatric/Behavioral: Negative for depression and  substance abuse. The patient is not nervous/anxious and does not have insomnia.     Family history- Review and unchanged   Social history- Review and unchanged  Physical Exam: BP (!) 86/64   Pulse 91   Temp (!) 96.4 F (35.8 C)   Wt 207 lb (93.9 kg)   SpO2 96%   BMI 29.70 kg/m  Wt Readings from Last 3 Encounters:  09/06/19 207 lb (93.9 kg)  09/06/19 207 lb (93.9 kg)  09/01/19 212 lb 3.2 oz (96.3 kg)   General Appearance: Well nourished well developed, ill appearing adult male, appears pale, diaphoretic, in mild distress Eyes: PERRLA, No conjunctival swelling or erythema ENT/Mouth: Ear canals clear with no erythema, swelling, or discharge.  TMs normal bilaterally, oropharynx clear, moist, with no exudate.   Neck: Supple, thyroid normal, no JVD, no cervical adenopathy.  Respiratory: Respiratory effort normal, breath sounds clear A&P, no wheeze, rhonchi or rales noted.  No retractions, no accessory muscle usage.  Cardio: Very faint/distant heart sounds, regular rhythm, thready pulses bil upper and lower extremities without edema.  Abdomen: Soft, + BS.  Non tender, no guarding, rebound, hernias, masses. Musculoskeletal: No obvious deformity, gait not assessed due to hypotension and distress Skin: pale, cool, clammy, diaphoretic Neuro: Awake and oriented X 3, normal symmetrical upper and lower extremity strength, sensation intact  Psych: fidgeting and anxious appearing, Insight and Judgment is fair   Izora Ribas, NP 12:07 PM [facility]

## 2019-09-06 ENCOUNTER — Telehealth (HOSPITAL_COMMUNITY): Payer: Self-pay

## 2019-09-06 ENCOUNTER — Encounter (HOSPITAL_COMMUNITY): Payer: Self-pay

## 2019-09-06 ENCOUNTER — Emergency Department (HOSPITAL_COMMUNITY)
Admission: EM | Admit: 2019-09-06 | Discharge: 2019-09-06 | Disposition: A | Discharge status: Home / Self Care | Payer: Managed Care, Other (non HMO) | Attending: Emergency Medicine | Admitting: Emergency Medicine

## 2019-09-06 ENCOUNTER — Encounter: Payer: Self-pay | Admitting: Adult Health

## 2019-09-06 ENCOUNTER — Other Ambulatory Visit: Payer: Self-pay

## 2019-09-06 ENCOUNTER — Ambulatory Visit: Payer: Managed Care, Other (non HMO) | Admitting: Adult Health

## 2019-09-06 VITALS — BP 86/64 | HR 91 | Temp 96.4°F | Wt 207.0 lb

## 2019-09-06 DIAGNOSIS — Z79899 Other long term (current) drug therapy: Secondary | ICD-10-CM | POA: Diagnosis not present

## 2019-09-06 DIAGNOSIS — I25110 Atherosclerotic heart disease of native coronary artery with unstable angina pectoris: Secondary | ICD-10-CM

## 2019-09-06 DIAGNOSIS — Z951 Presence of aortocoronary bypass graft: Secondary | ICD-10-CM

## 2019-09-06 DIAGNOSIS — I5041 Acute combined systolic (congestive) and diastolic (congestive) heart failure: Secondary | ICD-10-CM

## 2019-09-06 DIAGNOSIS — E119 Type 2 diabetes mellitus without complications: Secondary | ICD-10-CM | POA: Diagnosis not present

## 2019-09-06 DIAGNOSIS — J45909 Unspecified asthma, uncomplicated: Secondary | ICD-10-CM | POA: Insufficient documentation

## 2019-09-06 DIAGNOSIS — I5040 Unspecified combined systolic (congestive) and diastolic (congestive) heart failure: Secondary | ICD-10-CM | POA: Insufficient documentation

## 2019-09-06 DIAGNOSIS — E1169 Type 2 diabetes mellitus with other specified complication: Secondary | ICD-10-CM

## 2019-09-06 DIAGNOSIS — F329 Major depressive disorder, single episode, unspecified: Secondary | ICD-10-CM

## 2019-09-06 DIAGNOSIS — G4733 Obstructive sleep apnea (adult) (pediatric): Secondary | ICD-10-CM

## 2019-09-06 DIAGNOSIS — R42 Dizziness and giddiness: Secondary | ICD-10-CM | POA: Insufficient documentation

## 2019-09-06 DIAGNOSIS — Z7982 Long term (current) use of aspirin: Secondary | ICD-10-CM | POA: Insufficient documentation

## 2019-09-06 DIAGNOSIS — R61 Generalized hyperhidrosis: Secondary | ICD-10-CM

## 2019-09-06 DIAGNOSIS — I214 Non-ST elevation (NSTEMI) myocardial infarction: Secondary | ICD-10-CM | POA: Diagnosis not present

## 2019-09-06 DIAGNOSIS — R231 Pallor: Secondary | ICD-10-CM

## 2019-09-06 DIAGNOSIS — R209 Unspecified disturbances of skin sensation: Secondary | ICD-10-CM

## 2019-09-06 DIAGNOSIS — I2510 Atherosclerotic heart disease of native coronary artery without angina pectoris: Secondary | ICD-10-CM | POA: Diagnosis not present

## 2019-09-06 DIAGNOSIS — I959 Hypotension, unspecified: Secondary | ICD-10-CM | POA: Diagnosis not present

## 2019-09-06 DIAGNOSIS — E663 Overweight: Secondary | ICD-10-CM

## 2019-09-06 DIAGNOSIS — F32A Depression, unspecified: Secondary | ICD-10-CM

## 2019-09-06 DIAGNOSIS — I110 Hypertensive heart disease with heart failure: Secondary | ICD-10-CM | POA: Diagnosis not present

## 2019-09-06 DIAGNOSIS — I10 Essential (primary) hypertension: Secondary | ICD-10-CM

## 2019-09-06 DIAGNOSIS — Z794 Long term (current) use of insulin: Secondary | ICD-10-CM | POA: Insufficient documentation

## 2019-09-06 DIAGNOSIS — I951 Orthostatic hypotension: Secondary | ICD-10-CM | POA: Insufficient documentation

## 2019-09-06 DIAGNOSIS — E785 Hyperlipidemia, unspecified: Secondary | ICD-10-CM

## 2019-09-06 DIAGNOSIS — E559 Vitamin D deficiency, unspecified: Secondary | ICD-10-CM

## 2019-09-06 DIAGNOSIS — E039 Hypothyroidism, unspecified: Secondary | ICD-10-CM

## 2019-09-06 HISTORY — DX: Atherosclerotic heart disease of native coronary artery without angina pectoris: I25.10

## 2019-09-06 LAB — CBC WITH DIFFERENTIAL/PLATELET
Abs Immature Granulocytes: 0.14 10*3/uL — ABNORMAL HIGH (ref 0.00–0.07)
Basophils Absolute: 0.1 10*3/uL (ref 0.0–0.1)
Basophils Relative: 1 %
Eosinophils Absolute: 0.3 10*3/uL (ref 0.0–0.5)
Eosinophils Relative: 2 %
HCT: 33.8 % — ABNORMAL LOW (ref 39.0–52.0)
Hemoglobin: 11.3 g/dL — ABNORMAL LOW (ref 13.0–17.0)
Immature Granulocytes: 1 %
Lymphocytes Relative: 14 %
Lymphs Abs: 1.6 10*3/uL (ref 0.7–4.0)
MCH: 31.2 pg (ref 26.0–34.0)
MCHC: 33.4 g/dL (ref 30.0–36.0)
MCV: 93.4 fL (ref 80.0–100.0)
Monocytes Absolute: 1.3 10*3/uL — ABNORMAL HIGH (ref 0.1–1.0)
Monocytes Relative: 11 %
Neutro Abs: 8.4 10*3/uL — ABNORMAL HIGH (ref 1.7–7.7)
Neutrophils Relative %: 71 %
Platelets: 386 10*3/uL (ref 150–400)
RBC: 3.62 MIL/uL — ABNORMAL LOW (ref 4.22–5.81)
RDW: 12.1 % (ref 11.5–15.5)
WBC: 11.9 10*3/uL — ABNORMAL HIGH (ref 4.0–10.5)
nRBC: 0 % (ref 0.0–0.2)

## 2019-09-06 LAB — BASIC METABOLIC PANEL
Anion gap: 16 — ABNORMAL HIGH (ref 5–15)
BUN: 24 mg/dL — ABNORMAL HIGH (ref 6–20)
CO2: 20 mmol/L — ABNORMAL LOW (ref 22–32)
Calcium: 9.3 mg/dL (ref 8.9–10.3)
Chloride: 97 mmol/L — ABNORMAL LOW (ref 98–111)
Creatinine, Ser: 1.13 mg/dL (ref 0.61–1.24)
GFR calc Af Amer: >60 mL/min (ref >60)
GFR calc non Af Amer: >60 mL/min (ref >60)
Glucose, Bld: 159 mg/dL — ABNORMAL HIGH (ref 70–99)
Potassium: 4.5 mmol/L (ref 3.5–5.1)
Sodium: 133 mmol/L — ABNORMAL LOW (ref 135–145)

## 2019-09-06 LAB — CBG MONITORING, ED: Glucose-Capillary: 155 mg/dL — ABNORMAL HIGH (ref 70–99)

## 2019-09-06 LAB — TROPONIN I (HIGH SENSITIVITY)
Troponin I (High Sensitivity): 90 ng/L — ABNORMAL HIGH (ref <18)
Troponin I (High Sensitivity): 87 ng/L — ABNORMAL HIGH (ref <18)

## 2019-09-06 MED ORDER — SODIUM CHLORIDE 0.9 % IV BOLUS: 1000.0000 mL | Freq: Once | INTRAVENOUS | Status: DC

## 2019-09-06 MED ORDER — SODIUM CHLORIDE 0.9 % IV BOLUS
1000.0000 mL | Freq: Once | INTRAVENOUS | Status: AC
  Administered 2019-09-06: 1000 mL via INTRAVENOUS

## 2019-09-06 NOTE — Discharge Instructions (Addendum)
Please discontinue taking your metoprolol. Schedule close follow up appointment with your cardiologist. Return for worsening symptoms.

## 2019-09-06 NOTE — ED Notes (Signed)
cariodlogy consulted and told pt he could be discharged and would be soon.  Pt up to BR (I) without lightheadedness or acute changes.

## 2019-09-06 NOTE — Telephone Encounter (Signed)
Pt insurance is active and benefits verified through [place]. Co-pay $0.00, DED $2,000.00/$2,000.00 met, out of pocket $0.00/$0.00 met, co-insurance 0%. No pre-authorization required. Omar/[employer], 09/06/19 @ 414PM, KCM#[id number]  Will contact patient to see if he is interested in the Cardiac Rehab Program. If interested, patient will need to complete follow up appt. Once completed, patient will be contacted for scheduling upon review by the RN Navigator.

## 2019-09-06 NOTE — Telephone Encounter (Signed)
Attempted to call patient in regards to Cardiac Rehab - pt was in the ED, will follow up after follow up appt

## 2019-09-06 NOTE — ED Provider Notes (Addendum)
[facility] EMERGENCY DEPARTMENT Provider Note   CSN: [id number] Arrival date & time: 09/06/19  1135     History No chief complaint on file.   [name] [name] is a 54 y.o. male w PMHx CAD s/p NSTEMI and CABGx7 on 08/27/2019, presenting to the ED with lightheadedness x5 days.  Patient states he has had significant lightheadedness with standing over the last 5 days, however today his symptoms significantly worsened.  He states he went to his PCP office today for his scheduled appointment though his symptoms significantly worsened.  He is very lightheaded with significant diaphoresis.  He states last night overnight he also had significant sweating and soaked his pillow.  He denies associated chest pain, shortness of breath, nausea or vomiting, new lower extremity edema.  He does endorse poor appetite since his procedure however states he has been drinking plenty of water.  He was recently started on metoprolol 25 mg twice daily, however no other new medications.  The history is provided by the patient and medical records.       Past Medical History:  Diagnosis Date  . Anxiety   . ASTHMA 10/28/2006  . Coronary artery disease   . Cough   . Diabetes mellitus type II 2000   age 86  . GERD (gastroesophageal reflux disease)   . Hyperlipidemia   . Hypertension 1995  . Hypothyroidism 2002   thyroidectomy for Goiter  . Lumbar back pain   . NASH (nonalcoholic steatohepatitis)   . Numbness   . OSA (obstructive sleep apnea) 11/19/2010  . Sleep apnea    Pt states mild case, doesn't use cpap    Patient Active Problem List   Diagnosis Date Noted  . Orthostatic hypotension   . Hyperlipidemia associated with type 2 diabetes mellitus ([place])   . 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 Coronary artery disease 08/27/2019  . ACS (acute coronary syndrome) ([place])   . Acute combined systolic and diastolic heart failure ([place])   . NSTEMI (non-ST elevated myocardial infarction) ([place])  08/25/2019  . Ectatic abdominal aorta ([place]) 11/01/2018  . NASH (nonalcoholic steatohepatitis)   . Elevated LFTs 09/13/2018  . Alcohol consumption heavy 09/12/2018  . GERD (gastroesophageal reflux disease) 08/13/2018  . Hypothyroidism 10/11/2017  . Essential hypertension 10/11/2017  . Vitamin D deficiency 10/11/2017  . Testosterone deficiency 10/11/2017  . Overweight (BMI 25.0-29.9) 11/10/2014  . Diabetes mellitus type 2, insulin dependent ([place]) 08/14/2012  . Carpal tunnel syndrome of right wrist 09/08/2011  . Depression 09/08/2011  . OSA (obstructive sleep apnea) 11/19/2010  . ASTHMA 10/28/2006    Past Surgical History:  Procedure Laterality Date  . bilateral shoulder surgery    . CORONARY ARTERY BYPASS GRAFT N/A 08/27/2019   Procedure: CORONARY ARTERY BYPASS GRAFTING (CABG) x7, LIMA TO LAD, SVG TO DIAG 1 WITH SEQUENTIAL SVG TO DIAG 2, SVG TO OM2, SVG TO PDA WITH SEQUENTIAL SVG TO PLB WITH SEQUENTIAL SVG TO OTHER.;  Surgeon: Gaye Pollack, MD;  Location: [facility] OR;  Service: Open Heart Surgery;  Laterality: N/A;  . ENDOVEIN HARVEST OF GREATER SAPHENOUS VEIN Right 08/27/2019   Procedure: [place] Of Greater Saphenous Vein;  Surgeon: Gaye Pollack, MD;  Location: [facility] OR;  Service: Open Heart Surgery;  Laterality: Right;  . LEFT HEART CATH AND CORONARY ANGIOGRAPHY N/A 08/26/2019   Procedure: LEFT HEART CATH AND CORONARY ANGIOGRAPHY;  Surgeon: Nelva Bush, MD;  Location: [facility];  Service: Cardiovascular;  Laterality: N/A;  .  right knee repair  1997  . TEE WITHOUT CARDIOVERSION N/A 08/27/2019   Procedure: TRANSESOPHAGEAL ECHOCARDIOGRAM (TEE);  Surgeon: Gaye Pollack, MD;  Location: [facility];  Service: Open Heart Surgery;  Laterality: N/A;  . THYROIDECTOMY     right  . TRIGGER FINGER RELEASE    . TRIGGER FINGER RELEASE  02/23/2011   Procedure: RELEASE TRIGGER FINGER/A-1 PULLEY;  Surgeon: Meredith Pel;  Location: [facility];  Service: Orthopedics;  Laterality: Right;   Right 4th trigger finger release       Family History  Problem Relation Age of Onset  . Osteoarthritis Father   . Heart attack Father   . Skin cancer Father   . Breast cancer Mother   . Colon cancer Neg Hx   . Esophageal cancer Neg Hx   . Rectal cancer Neg Hx   . Stomach cancer Neg Hx     Social History   Tobacco Use  . Smoking status: Never Smoker  . Smokeless tobacco: Never Used  Substance Use Topics  . Alcohol use: Yes    Alcohol/week: 0.0 standard drinks    Comment: 20 beers per week  . Drug use: Yes    Types: Marijuana    Home Medications Prior to Admission medications   Medication Sig Start Date End Date Taking? Authorizing Provider  acetaminophen (TYLENOL) 325 MG tablet Take 2 tablets (650 mg total) by mouth every 6 (six) hours as needed for mild pain. 09/01/19  Yes Elgie Collard, PA-C  aspirin EC 325 MG EC tablet Take 1 tablet (325 mg total) by mouth daily. 09/01/19  Yes Conte, Tessa N, PA-C  blood glucose meter kit and supplies KIT Dispense based on patient and insurance preference. Use up to 3 times daily as directed. (FOR ICD-10-E11.9 and Z79.4). Patient taking differently: Inject 1 each into the skin See admin instructions. Dispense based on patient and insurance preference. Use up to 3 times daily as directed. (FOR ICD-10-E11.9 and Z79.4). 06/15/17  Yes Unk Pinto, MD  citalopram (CELEXA) 40 MG tablet Take 1 tablet Daily for Mood Patient taking differently: Take 40 mg by mouth daily.  02/27/19  Yes Unk Pinto, MD  Continuous Blood Gluc Receiver (FREESTYLE LIBRE 2 READER) DEVI 1 each with 6 refills. Use as directed Patient taking differently: 1 each by Other route as directed.  08/30/19  Yes Lars Pinks M, PA-C  Continuous Blood Gluc Sensor (FREESTYLE LIBRE 2 SENSOR) MISC Please supply one box and 6 refills. Use as directed Patient taking differently: 1 each by Other route See admin instructions. Please supply one box and 6 refills. Use as  directed 08/30/19  Yes Tacy Dura, Donielle M, PA-C  empagliflozin (JARDIANCE) 25 MG TABS tablet Take 25 mg by mouth daily.    Yes [provider]  levothyroxine (SYNTHROID) 137 MCG tablet Take 1 tablet daily on an empty stomach with only water for 30 minutes & no Antacid meds, Calcium or Magnesium for 4 hours & avoid Biotin Patient taking differently: Take 137 mcg by mouth daily before breakfast.  07/13/19  Yes Unk Pinto, MD  metFORMIN (GLUCOPHAGE-XR) 500 MG 24 hr tablet Take 2 tablets 2 x  /day with Meals for Diabetes Patient taking differently: Take 1,000 mg by mouth 2 (two) times daily with a meal.  07/13/19  Yes Unk Pinto, MD  metoprolol tartrate (LOPRESSOR) 25 MG tablet Take 1 tablet (25 mg total) by mouth 2 (two) times daily. 09/01/19  Yes Conte, Tessa N, PA-C  omeprazole (PRILOSEC) 40 MG  capsule Take 1 capsule Daily for Indigestion & Reflux Patient taking differently: Take 40 mg by mouth in the morning and at bedtime.  07/23/19  Yes Vicie Mutters, PA-C  rosuvastatin (CRESTOR) 40 MG tablet Take 1 tablet Daily for Cholesterol Patient taking differently: Take 40 mg by mouth daily.  07/13/19  Yes Unk Pinto, MD  traMADol (ULTRAM) 50 MG tablet Take 1 tablet (50 mg total) by mouth every 6 (six) hours as needed for moderate pain. 09/01/19  Yes Conte, Tessa N, PA-C  insulin degludec (TRESIBA FLEXTOUCH) 100 UNIT/ML FlexTouch Pen Inject 30 Units into the skin daily.    [provider]  VITAMIN D PO Take 10,000 Units by mouth daily.     [provider]    Allergies    Patient has no known allergies.  Review of Systems   Review of Systems  All other systems reviewed and are negative.   Physical Exam Updated Vital Signs BP 127/86 (BP Location: Right Arm)   Pulse 98   Temp 99.2 F (37.3 C) (Oral)   Resp 15   Ht '5\' 10"'$  (1.778 m)   Wt 93.9 kg   SpO2 100%   BMI 29.70 kg/m   Physical Exam Vitals and nursing note reviewed.  Constitutional:       General: He is not in acute distress.    Appearance: He is well-developed.  HENT:     Head: Normocephalic and atraumatic.  Eyes:     Conjunctiva/sclera: Conjunctivae normal.  Cardiovascular:     Rate and Rhythm: Normal rate and regular rhythm.     Comments: Surgical scar central chest appears to be healing well without redness or drainage. Pulmonary:     Effort: Pulmonary effort is normal. No respiratory distress.     Breath sounds: Normal breath sounds.  Abdominal:     General: Bowel sounds are normal.     Palpations: Abdomen is soft.     Tenderness: There is no abdominal tenderness.  Musculoskeletal:     Right lower leg: No edema.     Left lower leg: No edema.  Skin:    General: Skin is warm.  Neurological:     Mental Status: He is alert.  Psychiatric:        Behavior: Behavior normal.     ED Results / Procedures / Treatments   Labs (all labs ordered are listed, but only abnormal results are displayed) Labs Reviewed  BASIC METABOLIC PANEL - Abnormal; Notable for the following components:      Result Value   Sodium 133 (*)    Chloride 97 (*)    CO2 20 (*)    Glucose, Bld 159 (*)    BUN 24 (*)    Anion gap 16 (*)    All other components within normal limits  CBC WITH DIFFERENTIAL/PLATELET - Abnormal; Notable for the following components:   WBC 11.9 (*)    RBC 3.62 (*)    Hemoglobin 11.3 (*)    HCT 33.8 (*)    Neutro Abs 8.4 (*)    Monocytes Absolute 1.3 (*)    Abs Immature Granulocytes 0.14 (*)    All other components within normal limits  CBG MONITORING, ED - Abnormal; Notable for the following components:   Glucose-Capillary 155 (*)    All other components within normal limits  TROPONIN I (HIGH SENSITIVITY) - Abnormal; Notable for the following components:   Troponin I (High Sensitivity) 90 (*)    All other components within normal limits  TROPONIN I (HIGH SENSITIVITY) - Abnormal; Notable for the following components:   Troponin I (High Sensitivity) 87 (*)      All other components within normal limits    EKG EKG Interpretation  Date/Time:  Friday September 06 2019 11:40:26 EDT Ventricular Rate:  82 PR Interval:    QRS Duration: 86 QT Interval:  423 QTC Calculation: 495 R Axis:   76 Text Interpretation: Sinus rhythm Abnrm T, consider ischemia, anterolateral lds Confirmed by Veryl Speak [phone]) on 09/06/2019 12:22:35 PM   Radiology No results found.  Procedures Procedures (including critical care time)  Medications Ordered in ED Medications  sodium chloride 0.9 % bolus 1,000 mL (0 mLs Intravenous Stopped 09/06/19 1507)    ED Course  I have reviewed the triage vital signs and the nursing notes.  Pertinent labs & imaging results that were available during my care of the patient were reviewed by me and considered in my medical decision making (see chart for details).  Clinical Course as of Sep 05 1898  Fri Sep 06, 2019  1225 Chart reviewed. Pt discussed with Dr. Stark Jock. Will obtain labs and consult cardiology.   [JR]  0254 Cardiology to evaluate   [JR]  1742 Discussed with Dr. Debara Pickett. Recommends from cardiology stand point, pt is ok for discharge with downtrending troponin. Believes EKG changes are likely due to hypotension. Recommends discontinue metoprolol, pt has close outpt cardiology f/u. Requests CT surgery be made aware of patient.   [JR]  1840 Patient evaluated by Dr. Prescott Gum with CT surgery.  Agreeable with discharge.   [JR]    Clinical Course User Index [JR] Daronte Shostak, [place] N, PA-C   MDM Rules/Calculators/A&P                      Patient presenting with lightheadedness and diaphoresis over the last 5 days, worsening today.  Symptoms are worse with standing/position changes.  He has significant recent history of CABG x7 on 08/27/2019 for NSTEMI.  He was noted to be hypotensive at his PCP office today with low normal pressures here in the ED.  EKG with some new T wave changes.  No anemia.  CBG is 155.  Initial troponin is 90,  though significantly lower from recent.  Troponin is downtrending in the ED to 87.  Orthostatic vital signs are positive.  Given IV fluids.  Suspect symptoms may be secondary to metoprolol however given recent significant cardiac events and surgery, consulted with cardiology. Dr. Debara Pickett evaluated patient and recommends EKG changes and presentation are likely due to hypotension and recommends discontinue metoprolol.  Also recommended CT surgery be made aware of patient.  Dr. Darcey Nora with CT surgery evaluated the patient as well and agrees with discharge plan.  Patient instructed to follow closely with cardiology outpatient and discontinue metoprolol.  Patient ambulated in the ED without lightheadedness.  He is instructed of strict return precautions.  Safe for discharge.  Discussed results, findings, treatment and follow up. Patient advised of return precautions. Patient verbalized understanding and agreed with plan.  Final Clinical Impression(s) / ED Diagnoses Final diagnoses:  Lightheadedness  Hypotension, unspecified hypotension type    Rx / DC Orders ED Discharge Orders    None       Eustace Hur, [place] N, PA-C 09/06/19 1903    Rease Swinson, [place] N, PA-C 09/06/19 1903    Tegeler, Gwenyth Allegra, MD 09/07/19 [phone]

## 2019-09-06 NOTE — Consult Note (Signed)
Cardiology Admission History and Physical:   Patient ID: [name] [name] MRN: [id number]; DOB: 1965/10/03   Admission date: 09/06/2019  Primary Care Provider: Unk Pinto, MD [facility] Cardiologist: Skeet Latch, MD  [place] Electrophysiologist:  None   Chief Complaint: Dizziness  Patient Profile:   [name] [name] is a 54 y.o. 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   History of Present Illness:   [name] [name] was admitted found to have NSTEMI 08/25/19. Cath showed severe multivessel CAD, LVEF 45% with anterior hypokinesis and normal LV filing pressure. Echo showed EF 40-45%, G2DD, wall motion abnormalities. Patient underwent CABG x 7 5/25 and was discharged 5/26 on Hyzaar, lopressor '25mg'$  BID, rosuvastatin '40mg'$ , aspirin '325mg'$  daily.   The patient was seen by his PCP in clinic today and was hypotensive, cold and clammy. He denied dyspnea or CP but did feel generally fatigued. He had been feeling lightheaded almost every day for the last week, worse on standing. He had been taking his Lopressor BID but not been checking his blood pressures. Denies fever, chills, LLE, orthopnea, N/V. BP in the office showed hypotension 70s/40s. EKG with possible ischemic changes. EMS was called who placed him ob 2L O2 and he was brought to [facility]   In the ED BP 102/68, pulse 81, afebrile, RR 21, 100% O2. Labs showed potassium 133, potassium 4.5, glucose 159, BUN 24, creatinine 1.13, WBC 11.9, Hgb 11.3. HS troponin 90>87. EKG showed sinus 81bpm, ant TWI and aVL with minimal STE inferior leads. He was given IVF bolus and Cardiology was consulted.    Past Medical History:  Diagnosis Date  . Anxiety   . ASTHMA 10/28/2006  . Coronary artery disease   . Cough   . Diabetes mellitus type II 2000     age 60  . GERD (gastroesophageal reflux disease)   . Hyperlipidemia   . Hypertension 1995  . Hypothyroidism 2002   thyroidectomy for Goiter  . Lumbar back pain   . NASH (nonalcoholic steatohepatitis)   . Numbness   . OSA (obstructive sleep apnea) 11/19/2010  . Sleep apnea    Pt states mild case, doesn't use cpap    Past Surgical History:  Procedure Laterality Date  . bilateral shoulder surgery    . CORONARY ARTERY BYPASS GRAFT N/A 08/27/2019   Procedure: CORONARY ARTERY BYPASS GRAFTING (CABG) x7, LIMA TO LAD, SVG TO DIAG 1 WITH SEQUENTIAL SVG TO DIAG 2, SVG TO OM2, SVG TO PDA WITH SEQUENTIAL SVG TO PLB WITH SEQUENTIAL SVG TO OTHER.;  Surgeon: Gaye Pollack, MD;  Location: [place] OR;  Service: Open Heart Surgery;  Laterality: N/A;  . ENDOVEIN HARVEST OF GREATER SAPHENOUS VEIN Right 08/27/2019   Procedure: [place] Of Greater Saphenous Vein;  Surgeon: Gaye Pollack, MD;  Location: [facility] OR;  Service: Open Heart Surgery;  Laterality: Right;  . LEFT HEART CATH AND CORONARY ANGIOGRAPHY N/A 08/26/2019   Procedure: LEFT HEART CATH AND CORONARY ANGIOGRAPHY;  Surgeon: Nelva Bush, MD;  Location: [place] CV LAB;  Service: Cardiovascular;  Laterality: N/A;  . right knee repair  1997  . TEE WITHOUT CARDIOVERSION N/A 08/27/2019   Procedure: TRANSESOPHAGEAL ECHOCARDIOGRAM (TEE);  Surgeon: Gaye Pollack, MD;  Location: [place];  Service: Open Heart Surgery;  Laterality: N/A;  . THYROIDECTOMY  right  . TRIGGER FINGER RELEASE    . TRIGGER FINGER RELEASE  02/23/2011   Procedure: RELEASE TRIGGER FINGER/A-1 PULLEY;  Surgeon: Meredith Pel;  Location: [place];  Service: Orthopedics;  Laterality: Right;  Right 4th trigger finger release     Medications Prior to Admission: Prior to Admission medications   Medication Sig Start Date End Date Taking? Authorizing Provider  acetaminophen (TYLENOL) 325 MG tablet Take 2 tablets (650 mg total) by mouth every 6 (six) hours as needed for mild  pain. 09/01/19  Yes Elgie Collard, PA-C  aspirin EC 325 MG EC tablet Take 1 tablet (325 mg total) by mouth daily. 09/01/19  Yes Conte, Tessa N, PA-C  blood glucose meter kit and supplies KIT Dispense based on patient and insurance preference. Use up to 3 times daily as directed. (FOR ICD-10-E11.9 and Z79.4). Patient taking differently: Inject 1 each into the skin See admin instructions. Dispense based on patient and insurance preference. Use up to 3 times daily as directed. (FOR ICD-10-E11.9 and Z79.4). 06/15/17  Yes Unk Pinto, MD  citalopram (CELEXA) 40 MG tablet Take 1 tablet Daily for Mood Patient taking differently: Take 40 mg by mouth daily.  02/27/19  Yes Unk Pinto, MD  Continuous Blood Gluc Receiver (FREESTYLE LIBRE 2 READER) DEVI 1 each with 6 refills. Use as directed Patient taking differently: 1 each by Other route as directed.  08/30/19  Yes Lars Pinks M, PA-C  Continuous Blood Gluc Sensor (FREESTYLE LIBRE 2 SENSOR) MISC Please supply one box and 6 refills. Use as directed Patient taking differently: 1 each by Other route See admin instructions. Please supply one box and 6 refills. Use as directed 08/30/19  Yes Tacy Dura, Donielle M, PA-C  empagliflozin (JARDIANCE) 25 MG TABS tablet Take 25 mg by mouth daily.    Yes [provider]  levothyroxine (SYNTHROID) 137 MCG tablet Take 1 tablet daily on an empty stomach with only water for 30 minutes & no Antacid meds, Calcium or Magnesium for 4 hours & avoid Biotin Patient taking differently: Take 137 mcg by mouth daily before breakfast.  07/13/19  Yes Unk Pinto, MD  metFORMIN (GLUCOPHAGE-XR) 500 MG 24 hr tablet Take 2 tablets 2 x  /day with Meals for Diabetes Patient taking differently: Take 1,000 mg by mouth 2 (two) times daily with a meal.  07/13/19  Yes Unk Pinto, MD  metoprolol tartrate (LOPRESSOR) 25 MG tablet Take 1 tablet (25 mg total) by mouth 2 (two) times daily. 09/01/19  Yes Conte, Tessa N, PA-C    omeprazole (PRILOSEC) 40 MG capsule Take 1 capsule Daily for Indigestion & Reflux Patient taking differently: Take 40 mg by mouth in the morning and at bedtime.  07/23/19  Yes Vicie Mutters, PA-C  rosuvastatin (CRESTOR) 40 MG tablet Take 1 tablet Daily for Cholesterol Patient taking differently: Take 40 mg by mouth daily.  07/13/19  Yes Unk Pinto, MD  traMADol (ULTRAM) 50 MG tablet Take 1 tablet (50 mg total) by mouth every 6 (six) hours as needed for moderate pain. 09/01/19  Yes Conte, Tessa N, PA-C  insulin degludec (TRESIBA FLEXTOUCH) 100 UNIT/ML FlexTouch Pen Inject 30 Units into the skin daily.    [provider]  VITAMIN D PO Take 10,000 Units by mouth daily.     [provider]     Allergies:   No Known Allergies  Social History:   Social History   Socioeconomic History  . Marital status: Married    Spouse name: Not  on file  . Number of children: 2  . Years of education: Not on file  . Highest education level: Not on file  Occupational History  . 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 Smoking status: Never Smoker  . Smokeless tobacco: Never Used  Substance and Sexual Activity  . Alcohol use: Yes    Alcohol/week: 0.0 standard drinks    Comment: 20 beers per week  . Drug use: Yes    Types: Marijuana  . Sexual activity: Not on file  Other Topics Concern  . Not on file  Social History Narrative  . Not on file   Social Determinants of Health   Financial Resource Strain:   . Difficulty of Paying Living Expenses:   Food Insecurity:   . Worried About Charity fundraiser in the Last Year:   . Arboriculturist in the Last Year:   Transportation Needs:   . Film/video editor (Medical):   Marland Kitchen Lack of Transportation (Non-Medical):   Physical Activity:   . Days of Exercise per Week:   . Minutes of Exercise per Session:   Stress:   . Feeling of Stress :   Social Connections:   . Frequency of Communication with Friends  and Family:   . Frequency of Social Gatherings with Friends and Family:   . Attends Religious Services:   . Active Member of Clubs or Organizations:   . Attends Archivist Meetings:   Marland Kitchen Marital Status:   Intimate Partner Violence:   . Fear of Current or Ex-Partner:   . Emotionally Abused:   Marland Kitchen Physically Abused:   . Sexually Abused:     Family History:   The patient's family history includes Breast cancer in his mother; Heart attack in his father; Osteoarthritis in his father; Skin cancer in his father. There is no history of Colon cancer, Esophageal cancer, Rectal cancer, or Stomach cancer.    ROS:  Please see the history of present illness.  All other ROS reviewed and negative.     Physical Exam/Data:   Vitals:   09/06/19 1138 09/06/19 1142 09/06/19 1143  BP: 102/68    Pulse: 81    Resp: (!) 21    Temp: 98.8 F (37.1 C)    TempSrc: Oral    SpO2: 100% 100%   Weight:   93.9 kg  Height:   '5\' 10"'$  (1.778 m)    Intake/Output Summary (Last 24 hours) at 09/06/2019 1559 Last data filed at 09/06/2019 1507 Gross per 24 hour  Intake 1000 ml  Output --  Net 1000 ml   Last 3 Weights 09/06/2019 09/06/2019 09/01/2019  Weight (lbs) 207 lb 207 lb 212 lb 3.2 oz  Weight (kg) 93.895 kg 93.895 kg 96.253 kg     Body mass index is 29.7 kg/m.  General:  Well nourished, well developed, in no acute distress HEENT: normal Lymph: no adenopathy Neck: no JVD Endocrine:  No thryomegaly Vascular: No carotid bruits; FA pulses 2+ bilaterally without bruits  Cardiac:  normal S1, S2; RRR; no murmur  Lungs:  clear to auscultation bilaterally, no wheezing, rhonchi or rales  Abd: soft, nontender, no hepatomegaly  Ext: no edema Musculoskeletal:  No deformities, BUE and BLE strength normal and equal Skin: warm and dry  Neuro:  CNs 2-12 intact, no focal abnormalities noted Psych:  Normal affect    EKG:  The ECG that was done 09/06/19 was personally reviewed and demonstrates  EKG  showed sinus  81bpm, ant TWI and aVL with minimal STE inferior leads.  Relevant CV Studies:  Echo 08/26/19 1. Left ventricular ejection fraction, by estimation, is 40 to 45%. The  left ventricle has mildly decreased function. The left ventricle  demonstrates regional wall motion abnormalities (see scoring  diagram/findings for description). Left ventricular  diastolic parameters are consistent with Grade II diastolic dysfunction  (pseudonormalization). There is moderate hypokinesis of the left  ventricular, mid-apical anteroseptal wall.  2. Right ventricular systolic function is normal. The right ventricular  size is normal.  3. The mitral valve is normal in structure. Mild mitral valve  regurgitation. No evidence of mitral stenosis.  4. The aortic valve is normal in structure. Aortic valve regurgitation is  not visualized. No aortic stenosis is present.   Laboratory Data:  High Sensitivity Troponin:   Recent Labs  Lab 08/25/19 0810 08/25/19 1010 08/25/19 1408 08/25/19 1537 09/06/19 1219  TROPONINIHS 7,922* 13,766* 14,441* 12,219* 90*      Chemistry Recent Labs  Lab 09/06/19 1219  NA 133*  K 4.5  CL 97*  CO2 20*  GLUCOSE 159*  BUN 24*  CREATININE 1.13  CALCIUM 9.3  GFRNONAA >60  GFRAA >60  ANIONGAP 16*    No results for input(s): PROT, ALBUMIN, AST, ALT, ALKPHOS, BILITOT in the last 168 hours. Hematology Recent Labs  Lab 09/06/19 1219  WBC 11.9*  RBC 3.62*  HGB 11.3*  HCT 33.8*  MCV 93.4  MCH 31.2  MCHC 33.4  RDW 12.1  PLT 386   BNPNo results for input(s): BNP, PROBNP in the last 168 hours.  DDimer No results for input(s): DDIMER in the last 168 hours.   Radiology/Studies:  No results found. {   Assessment and Plan:   Dizziness/hypotension - Pressures in the office recorded with systolic in the 91L and 68Z - Creatinine/BUN mildly up on admission - BPs improved after IVF bolus. BP 992/34, repeat systolic 144 reported by patient - Patient was on  Lopressor 25 mg BID>>hold  - Not on any diuretics - Patient is feeling much better. Consider walking patient if he is stable can likely discharge home.  - Recommended he get a BP cuff and take pressures at home and record them.  - MD to see  CAD with recent CABG - HS troponin 90>87 - EKG with some TWI anterior leads  - Patient denies chest pain or sob. Can check one more troponin but trend is flat so far   HLD - rosuvastatin  Acute combined systolic and diastolic CHF - EF mildly reduced to 40-45%, G2DD on recent echo - Euvolemic on exam  Hypothyroidism - Recent TSH 4.5  For questions or updates, please contact [place] [facility] Please consult [email or web address] for contact info under     Signed, Lada Fulbright Ninfa Meeker, PA-C  09/06/2019 3:59 PM

## 2019-09-06 NOTE — ED Triage Notes (Signed)
Presents from PCP office with c/o dizziness and lightheadedness x 5 days intermittently with episode occurring at the office.  Pt recent NSTEMI on 5-24 with CABG on 5-25.

## 2019-09-06 NOTE — ED Triage Notes (Signed)
Pt denies any CP, nausea or SOB.  States sudden onset of diaphoresis and sever lightheadedness which was worse today.  Episodes prior have been mainly with standing.

## 2019-09-09 ENCOUNTER — Telehealth: Payer: Self-pay | Admitting: *Deleted

## 2019-09-09 ENCOUNTER — Telehealth: Payer: Self-pay | Admitting: Cardiovascular Disease

## 2019-09-09 NOTE — Telephone Encounter (Signed)
Patient's wife, Eustaquio Maize, is requesting documentation, to be signed by Dr. Oval Linsey, stating that patient had a heart attack on 08/25/19. Documentation may be faxed to [phone]. Please call to discuss.

## 2019-09-09 NOTE — Telephone Encounter (Signed)
Per Dr Melford Aase, the H and P from [facility] faxed to case manager at [facility].

## 2019-09-10 ENCOUNTER — Other Ambulatory Visit: Payer: Self-pay

## 2019-09-10 ENCOUNTER — Ambulatory Visit (INDEPENDENT_AMBULATORY_CARE_PROVIDER_SITE_OTHER): Payer: Self-pay

## 2019-09-10 VITALS — BP 122/83 | HR 100 | Temp 97.9°F | Resp 20

## 2019-09-10 DIAGNOSIS — Z951 Presence of aortocoronary bypass graft: Secondary | ICD-10-CM

## 2019-09-10 DIAGNOSIS — Z4802 Encounter for removal of sutures: Secondary | ICD-10-CM

## 2019-09-10 NOTE — Progress Notes (Signed)
Pt comes in today for suture removal s/p CABG on 08/27/19. He is a/o and without c/o. Four sutures are in place to upper abdomen; incisions are well approximated and w/o s/s of infection. Four sutures are easily removed per standard protocol. Instructed pt to keep sites clean (w/ mild soap and water) and dry. To notify office of any s/s of infection or other problems.

## 2019-09-14 NOTE — Progress Notes (Deleted)
Cardiology Office Note   Date:  09/14/2019   ID:  [name] [name], [name] [name] 04/15/1965, MRN [id number]  PCP:  Unk Pinto, MD  Cardiologist: Dr. Oval Linsey  No chief complaint on file.    History of Present Illness: [name] [name] is a 54 y.o. male who presents for posthospitalization follow-up after admission for chest pain with non-STEMI, status post CABG x7.  Patient also was found to have acute combined systolic diastolic heart failure, hypertension, hyperlipidemia, right internal carotid artery stenosis, with other history to include diabetes type 2, OSA, GERD, hypothyroidism, NASH, lumbar back pain and anxiety..  The patient was originally seen at [facility] at [facility] for evaluation due to chest pain and shortness of breath.  He was found to have an elevated high-sensitivity troponin greater than 14,000.  As result she was transferred to [facility] with cardiac catheterization revealing 90% distal left main coronary stenosis with 70 to 80% stenosis involving the LAD, diagonal, large obtuse marginal, and right coronary artery branches.  LVEF was 45% with anterior hypokinesis.  An echocardiogram was also completed which also revealed an EF was 45% with moderate hypokinesis of the mid apical anterior septal wall with grade 2 diastolic dysfunction.  It was felt that the patient would be a good candidate for CABG.  He underwent coronary artery bypass grafting x7 (LIMA to LAD, sequential SVG to diagonal 1 and diagonal 2, SVG to OM, sequential SVG to PDA, PL 1, and PL2, with endoscopic vein harvest from right leg by Dr. Cyndia Bent on 08/27/2019.  During hospitalization it was noted that his hemoglobin A1c was elevated at 10.8 and he was instructed to have close follow-up with his PCP.  He was to begin aspirin 325 mg daily, started on rosuvastatin 40 mg daily with a goal of LDL less than 70.  Past Medical History:  Diagnosis Date   Anxiety    ASTHMA 10/28/2006   Coronary artery disease     Cough    Diabetes mellitus type II 2000   age 31   GERD (gastroesophageal reflux disease)    Hyperlipidemia    Hypertension 1995   Hypothyroidism 2002   thyroidectomy for Goiter   Lumbar back pain    NASH (nonalcoholic steatohepatitis)    Numbness    OSA (obstructive sleep apnea) 11/19/2010   Sleep apnea    Pt states mild case, doesn't use cpap    Past Surgical History:  Procedure Laterality Date   bilateral shoulder surgery     CORONARY ARTERY BYPASS GRAFT N/A 08/27/2019   Procedure: CORONARY ARTERY BYPASS GRAFTING (CABG) x7, LIMA TO LAD, SVG TO DIAG 1 WITH SEQUENTIAL SVG TO DIAG 2, SVG TO OM2, SVG TO PDA WITH SEQUENTIAL SVG TO PLB WITH SEQUENTIAL SVG TO OTHER.;  Surgeon: Gaye Pollack, MD;  Location: [place] OR;  Service: Open Heart Surgery;  Laterality: N/A;   ENDOVEIN HARVEST OF GREATER SAPHENOUS VEIN Right 08/27/2019   Procedure: [place] Of Greater Saphenous Vein;  Surgeon: Gaye Pollack, MD;  Location: [facility] OR;  Service: Open Heart Surgery;  Laterality: Right;   LEFT HEART CATH AND CORONARY ANGIOGRAPHY N/A 08/26/2019   Procedure: LEFT HEART CATH AND CORONARY ANGIOGRAPHY;  Surgeon: Nelva Bush, MD;  Location: [place] CV LAB;  Service: Cardiovascular;  Laterality: N/A;   right knee repair  1997   TEE WITHOUT CARDIOVERSION N/A 08/27/2019   Procedure: TRANSESOPHAGEAL ECHOCARDIOGRAM (TEE);  Surgeon: Gaye Pollack, MD;  Location: [place];  Service: Open Heart Surgery;  Laterality: N/A;   THYROIDECTOMY     right   TRIGGER FINGER RELEASE     TRIGGER FINGER RELEASE  02/23/2011   Procedure: RELEASE TRIGGER FINGER/A-1 PULLEY;  Surgeon: Meredith Pel;  Location: [place];  Service: Orthopedics;  Laterality: Right;  Right 4th trigger finger release     Current Outpatient Medications  Medication Sig Dispense Refill   acetaminophen (TYLENOL) 325 MG tablet Take 2 tablets (650 mg total) by mouth every 6 (six) hours as needed for mild pain.     aspirin EC  325 MG EC tablet Take 1 tablet (325 mg total) by mouth daily. 30 tablet 0   blood glucose meter kit and supplies KIT Dispense based on patient and insurance preference. Use up to 3 times daily as directed. (FOR ICD-10-E11.9 and Z79.4). (Patient taking differently: Inject 1 each into the skin See admin instructions. Dispense based on patient and insurance preference. Use up to 3 times daily as directed. (FOR ICD-10-E11.9 and Z79.4).) 1 each 0   citalopram (CELEXA) 40 MG tablet Take 1 tablet Daily for Mood (Patient taking differently: Take 40 mg by mouth daily. ) 90 tablet 0   Continuous Blood Gluc Receiver (FREESTYLE LIBRE 2 READER) DEVI 1 each with 6 refills. Use as directed (Patient taking differently: 1 each by Other route as directed. ) 1 each 6   Continuous Blood Gluc Sensor (FREESTYLE LIBRE 2 SENSOR) MISC Please supply one box and 6 refills. Use as directed (Patient taking differently: 1 each by Other route See admin instructions. Please supply one box and 6 refills. Use as directed) 1 each 6   empagliflozin (JARDIANCE) 25 MG TABS tablet Take 25 mg by mouth daily.      insulin degludec (TRESIBA FLEXTOUCH) 100 UNIT/ML FlexTouch Pen Inject 30 Units into the skin daily.     levothyroxine (SYNTHROID) 137 MCG tablet Take 1 tablet daily on an empty stomach with only water for 30 minutes & no Antacid meds, Calcium or Magnesium for 4 hours & avoid Biotin (Patient taking differently: Take 137 mcg by mouth daily before breakfast. ) 90 tablet 0   metFORMIN (GLUCOPHAGE-XR) 500 MG 24 hr tablet Take 2 tablets 2 x  /day with Meals for Diabetes (Patient taking differently: Take 1,000 mg by mouth 2 (two) times daily with a meal. ) 360 tablet 0   metoprolol tartrate (LOPRESSOR) 25 MG tablet Take 1 tablet (25 mg total) by mouth 2 (two) times daily. 60 tablet 1   omeprazole (PRILOSEC) 40 MG capsule Take 1 capsule Daily for Indigestion & Reflux (Patient taking differently: Take 40 mg by mouth in the morning and  at bedtime. ) 30 capsule 3   rosuvastatin (CRESTOR) 40 MG tablet Take 1 tablet Daily for Cholesterol (Patient taking differently: Take 40 mg by mouth daily. ) 90 tablet 0   traMADol (ULTRAM) 50 MG tablet Take 1 tablet (50 mg total) by mouth every 6 (six) hours as needed for moderate pain. 30 tablet 0   VITAMIN D PO Take 10,000 Units by mouth daily.      No current facility-administered medications for this visit.    Allergies:   Patient has no known allergies.    Social History:  The patient  reports that he has never smoked. He has never used smokeless tobacco. He reports current alcohol use. He reports current drug use. Drug: Marijuana.   Family History:  The patient's family history includes Breast cancer in his mother; Heart attack in his father; Osteoarthritis in  his father; Skin cancer in his father.    ROS: All other systems are reviewed and negative. Unless otherwise mentioned in H&P    PHYSICAL EXAM: VS:  There were no vitals taken for this visit. , BMI There is no height or weight on file to calculate BMI. GEN: Well nourished, well developed, in no acute distress HEENT: normal Neck: no JVD, carotid bruits, or masses Cardiac: ***RRR; no murmurs, rubs, or gallops,no edema  Respiratory:  Clear to auscultation bilaterally, normal work of breathing GI: soft, nontender, nondistended, + BS MS: no deformity or atrophy Skin: warm and dry, no rash Neuro:  Strength and sensation are intact Psych: euthymic mood, full affect   EKG:  EKG {ACTION; IS/IS [id number]} ordered today. The ekg ordered today demonstrates ***   Recent Labs: 05/31/2019: TSH 4.50 08/25/2019: ALT 36 08/28/2019: Magnesium 2.0 09/06/2019: BUN 24; Creatinine, Ser 1.13; Hemoglobin 11.3; Platelets 386; Potassium 4.5; Sodium 133    Lipid Panel    Component Value Date/Time   CHOL 122 08/26/2019 0749   TRIG 116 08/26/2019 0749   HDL 41 08/26/2019 0749   CHOLHDL 3.0 08/26/2019 0749   VLDL 23 08/26/2019 0749    LDLCALC 58 08/26/2019 0749   LDLCALC  05/31/2019 0947     Comment:     . LDL cholesterol not calculated. Triglyceride levels greater than 400 mg/dL invalidate calculated LDL results. . Reference range: <100 . Desirable range <100 mg/dL for primary prevention;   <70 mg/dL for patients with CHD or diabetic patients  with > or = 2 CHD risk factors. Marland Kitchen LDL-C is now calculated using the Martin-Hopkins  calculation, which is a validated novel method providing  better accuracy than the Friedewald equation in the  estimation of LDL-C.  Cresenciano Genre et al. Annamaria Helling. 7579;728(20): 2061-2068  (http://education.[employer].com/faq/FAQ164)    LDLDIRECT 120.3 11/07/2006 1321      Wt Readings from Last 3 Encounters:  09/06/19 207 lb (93.9 kg)  09/06/19 207 lb (93.9 kg)  09/01/19 212 lb 3.2 oz (96.3 kg)      Other studies Reviewed: [facility] 08/26/2019 Conclusions: 1. Severe multivessel coronary artery disease, including 90% distal LMCA stenosis, as well as 70-80% lesions involving the mid LAD, D1, large OM1, and rPL branches. 2. Mildly to moderately reduced left ventricular contraction (LVEF ~45%) with anterior hypokinesis.  Upper normal left ventricular filling pressure.  Conclusion: 1. Cardiac surgery consultation for CABG.    ASSESSMENT AND PLAN:  1.  ***   Current medicines are reviewed at length with the patient today.  I have spent *** dedicated to the care of this patient on the date of this encounter to include pre-visit review of records, assessment, management and diagnostic testing,with shared decision making.  Labs/ tests ordered today include: *** Phill Myron. [place] Pugh, ANP, AACC   09/14/2019 8:37 PM    [facility] [place] [address] Office [phone] Fax [phone]  Notice: This dictation was prepared with Dragon dictation along with smaller phrase technology. Any transcriptional errors that result from this process are  unintentional and may not be corrected upon review.

## 2019-09-16 ENCOUNTER — Ambulatory Visit: Payer: Managed Care, Other (non HMO) | Admitting: Adult Health

## 2019-09-22 ENCOUNTER — Other Ambulatory Visit: Payer: Self-pay | Admitting: Internal Medicine

## 2019-09-27 ENCOUNTER — Other Ambulatory Visit: Payer: Self-pay | Admitting: Surgery

## 2019-09-27 DIAGNOSIS — Z951 Presence of aortocoronary bypass graft: Secondary | ICD-10-CM

## 2019-09-30 ENCOUNTER — Ambulatory Visit
Admission: RE | Admit: 2019-09-30 | Discharge: 2019-09-30 | Disposition: A | Discharge status: Home / Self Care | Payer: Managed Care, Other (non HMO) | Source: Ambulatory Visit | Attending: Surgery | Admitting: Surgery

## 2019-09-30 ENCOUNTER — Ambulatory Visit (INDEPENDENT_AMBULATORY_CARE_PROVIDER_SITE_OTHER): Payer: Self-pay | Admitting: Physician Assistant

## 2019-09-30 ENCOUNTER — Other Ambulatory Visit: Payer: Self-pay

## 2019-09-30 VITALS — BP 110/73 | HR 100 | Temp 97.9°F | Resp 20 | Ht 70.0 in | Wt 210.0 lb

## 2019-09-30 DIAGNOSIS — I2510 Atherosclerotic heart disease of native coronary artery without angina pectoris: Secondary | ICD-10-CM

## 2019-09-30 DIAGNOSIS — Z951 Presence of aortocoronary bypass graft: Secondary | ICD-10-CM

## 2019-09-30 NOTE — Patient Instructions (Signed)
May resume driving. Okay to travel. No change in medications Continue to observe sternal precautions Follow-up with Dr. Caffie Pinto in 4 to 6 weeks

## 2019-09-30 NOTE — Progress Notes (Signed)
HPI: [name] [name] is a 54 y.o. male patient.  1. 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 Coronary artery disease involving native heart without angina pectoris, unspecified vessel or lesion type    Past Medical History:  Diagnosis Date  . Anxiety   . ASTHMA 10/28/2006  . Coronary artery disease   . Cough   . Diabetes mellitus type II 2000   age 31  . GERD (gastroesophageal reflux disease)   . Hyperlipidemia   . Hypertension 1995  . Hypothyroidism 2002   thyroidectomy for Goiter  . Lumbar back pain   . NASH (nonalcoholic steatohepatitis)   . Numbness   . OSA (obstructive sleep apnea) 11/19/2010  . Sleep apnea    Pt states mild case, doesn't use cpap   No past surgical history pertinent negatives on file. Scheduled Meds: Continuous Infusions: PRN Meds:  No Known Allergies Active Problems:   * No active hospital problems. *  Blood pressure 110/73, pulse 100, temperature 97.9 F (36.6 C), temperature source Temporal, resp. rate 20, height 5\' 10"  (1.778 m), weight 210 lb (95.3 kg), SpO2 96 %.   HPI: Patient returns for routine postoperative follow-up having undergone CABG times on 08/22/2018 by Dr. 08/24/2018 after presenting with acute MI.  His ejection fraction prior to surgery was 45%.  His perioperative course was uneventful.  He was discharged in stable condition on his postoperative day.  He presented to the emergency room within the next week with a complaint of dizziness.  He was found to be hypotensive.  Dr. Lavinia Sharps was consulted and saw the patient in the emergency room.  He agreed with stopping the metoprolol and allowing the patient to return home.  Since then he has had occasional dizziness when he quickly changes positions but he says overall this is much better. He denies pain or shortness of breath. He continues to take the Crestor and ASA as prescribed along with his diabetes medications.     Current Outpatient Medications  Medication Sig Dispense Refill  .  acetaminophen (TYLENOL) 325 MG tablet Take 2 tablets (650 mg total) by mouth every 6 (six) hours as needed for mild pain.    Donata Clay aspirin EC 325 MG EC tablet Take 1 tablet (325 mg total) by mouth daily. 30 tablet 0  . blood glucose meter kit and supplies KIT Dispense based on patient and insurance preference. Use up to 3 times daily as directed. (FOR ICD-10-E11.9 and Z79.4). (Patient taking differently: Inject 1 each into the skin See admin instructions. Dispense based on patient and insurance preference. Use up to 3 times daily as directed. (FOR ICD-10-E11.9 and Z79.4).) 1 each 0  . citalopram (CELEXA) 40 MG tablet Take 1 tablet Daily for Mood 90 tablet 0  . Continuous Blood Gluc Receiver (FREESTYLE LIBRE 2 READER) DEVI 1 each with 6 refills. Use as directed (Patient taking differently: 1 each by Other route as directed. ) 1 each 6  . Continuous Blood Gluc Sensor (FREESTYLE LIBRE 2 SENSOR) MISC Please supply one box and 6 refills. Use as directed (Patient taking differently: 1 each by Other route See admin instructions. Please supply one box and 6 refills. Use as directed) 1 each 6  . empagliflozin (JARDIANCE) 25 MG TABS tablet Take 25 mg by mouth daily.     . insulin degludec (TRESIBA FLEXTOUCH) 100 UNIT/ML FlexTouch Pen Inject 30 Units into the skin daily.    Marland Kitchen levothyroxine (SYNTHROID) 137 MCG tablet Take 1 tablet daily on  an empty stomach with only water for 30 minutes & no Antacid meds, Calcium or Magnesium for 4 hours & avoid Biotin (Patient taking differently: Take 137 mcg by mouth daily before breakfast. ) 90 tablet 0  . metFORMIN (GLUCOPHAGE-XR) 500 MG 24 hr tablet Take 2 tablets 2 x  /day with Meals for Diabetes (Patient taking differently: Take 1,000 mg by mouth 2 (two) times daily with a meal. ) 360 tablet 0  . omeprazole (PRILOSEC) 40 MG capsule Take 1 capsule Daily for Indigestion & Reflux (Patient taking differently: Take 40 mg by mouth in the morning and at bedtime. ) 30 capsule 3  .  rosuvastatin (CRESTOR) 40 MG tablet Take 1 tablet Daily for Cholesterol (Patient taking differently: Take 40 mg by mouth daily. ) 90 tablet 0  . VITAMIN D PO Take 10,000 Units by mouth daily.      No current facility-administered medications for this visit.    Physical Exam:  General:  Awake and alert, appears well and has no specific concerns.  Heart- RRR Chest- breath sounds are CTA. CXR reviewed and has no complicating features.  Sternotomy incision is well-healed.  Sternum is stable. Extremities: No peripheral edema.  The right lower extremity EVH incision intact and well healed.   Diagnostic Tests:   EXAM: CHEST - 2 VIEW   COMPARISON:  08/31/2019  FINDINGS: Normal heart size post CABG.  Mediastinal contours and pulmonary vascularity normal.  Lungs clear.  No infiltrate, pleural effusion or pneumothorax.  Surgical clips in the RIGHT cervical region noted.  No acute osseous findings.  IMPRESSION: No acute abnormalities.   Electronically Signed   By: Lavonia Dana M.D.   On: 09/30/2019 14:25  Impression / Plan:  Mr. Wamser is making satisfactory progress following CABG times seven 1 month ago.  He did have some orthostatic hypotension early postoperatively that has resolved after discontinuing metoprolol.  We will continue with the aspirin and statin as prescribed.  He may resume driving at this point.  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  Precautions were reviewed. I asked him to follow-up with Dr. Caffie Pinto in 4 to 6 weeks.    Antony Odea, PA-C [facility] [phone]

## 2019-10-02 ENCOUNTER — Other Ambulatory Visit: Payer: Self-pay

## 2019-10-02 ENCOUNTER — Ambulatory Visit: Payer: Managed Care, Other (non HMO) | Admitting: Surgery

## 2019-10-02 ENCOUNTER — Encounter: Payer: Self-pay | Admitting: Adult Health

## 2019-10-02 ENCOUNTER — Ambulatory Visit: Payer: Managed Care, Other (non HMO) | Admitting: Adult Health

## 2019-10-02 VITALS — BP 118/72 | HR 91 | Ht 70.0 in | Wt 209.6 lb

## 2019-10-02 DIAGNOSIS — I2510 Atherosclerotic heart disease of native coronary artery without angina pectoris: Secondary | ICD-10-CM

## 2019-10-02 DIAGNOSIS — Z951 Presence of aortocoronary bypass graft: Secondary | ICD-10-CM | POA: Diagnosis not present

## 2019-10-02 DIAGNOSIS — I214 Non-ST elevation (NSTEMI) myocardial infarction: Secondary | ICD-10-CM

## 2019-10-02 DIAGNOSIS — E1169 Type 2 diabetes mellitus with other specified complication: Secondary | ICD-10-CM

## 2019-10-02 DIAGNOSIS — I5041 Acute combined systolic (congestive) and diastolic (congestive) heart failure: Secondary | ICD-10-CM

## 2019-10-02 DIAGNOSIS — E785 Hyperlipidemia, unspecified: Secondary | ICD-10-CM

## 2019-10-02 NOTE — Patient Instructions (Signed)
Medication Instructions:  Continue current medications  *If you need a refill on your cardiac medications before your next appointment, please call your pharmacy*   Lab Work: None ordered   Testing/Procedures: Your physician has requested that you have an echocardiogram in 3 Months . Echocardiography is a painless test that uses sound waves to create images of your heart. It provides your doctor with information about the size and shape of your heart and how well your heart's chambers and valves are working. This procedure takes approximately one hour. There are no restrictions for this procedure.   Follow-Up: At [facility], you and your health needs are our priority.  As part of our continuing mission to provide you with exceptional heart care, we have created designated Provider Care Teams.  These Care Teams include your primary Cardiologist (physician) and Advanced Practice Providers (APPs -  Physician Assistants and Nurse Practitioners) who all work together to provide you with the care you need, when you need it.  We recommend signing up for the patient portal called "MyChart".  Sign up information is provided on this After Visit Summary.  MyChart is used to connect with patients for Virtual Visits (Telemedicine).  Patients are able to view lab/test results, encounter notes, upcoming appointments, etc.  Non-urgent messages can be sent to your provider as well.   To learn more about what you can do with MyChart, go to [email or web address].    Your next appointment:   3 month(s)  The format for your next appointment:   In Person  Provider:   You may see Skeet Latch, MD or one of the following Advanced Practice Providers on your designated Care Team:    Kerin Ransom, PA-C  [place], [place]  Coletta Memos, [place]

## 2019-10-02 NOTE — Progress Notes (Signed)
Cardiology Office Note   Date:  10/02/2019   ID:  [name] [name], Dini 1965-10-28, MRN [id number]  PCP:  Unk Pinto, MD  Cardiologist:  Dr.[place]  CC: Post CABG follow up.   History of Present Illness: [name] [name] is a 54 y.o. male who presents for ongoing assessment and management of chronic combined systolic diastolic heart failure, hypertension, hyperlipidemia, history of carotid artery disease shows right ICA 40 to 50%, CAD with CABG x7 on 08/26/2019, with other history to include type 2 diabetes, chronic kidney disease, hypothyroidism, GERD, and vitamin D deficiency with depression and anxiety, OSA, and obesity.  He was seen during consultation while hospitalized on 09/06/2019 in the setting of recurrent chest pain and dizziness.  He was found to be hypotensive cold and clammy.  Blood pressures were in the 70s over 40s.  He was given IV fluid bolus and improvement in blood pressure to 102/68, and then 114/70.  Lopressor 25 mg twice daily was placed on hold.  Echocardiogram revealing mildly reduced EF of 40 to 45% with grade 2 diastolic dysfunction.  No other changes were made in his medication regimen.  He is here for post hospital follow-up.  He comes today feeling very well.  He is healing well, he denies any chest pain, shortness of breath, palpitations or significant fatigue.  He is medically compliant.  He is walking 1 mile a day 3 times a week and is interested in going to cardiac rehab.  He is not yet restarted beta-blocker.  Blood pressure is much better controlled off this medication.  He offers no complaints of dizziness ,heart racing.   Past Medical History:  Diagnosis Date  . Anxiety   . ASTHMA 10/28/2006  . Coronary artery disease   . Cough   . Diabetes mellitus type II 2000   age 14  . GERD (gastroesophageal reflux disease)   . Hyperlipidemia   . Hypertension 1995  . Hypothyroidism 2002   thyroidectomy for Goiter  . Lumbar back pain   . NASH  (nonalcoholic steatohepatitis)   . Numbness   . OSA (obstructive sleep apnea) 11/19/2010  . Sleep apnea    Pt states mild case, doesn't use cpap    Past Surgical History:  Procedure Laterality Date  . bilateral shoulder surgery    . CORONARY ARTERY BYPASS GRAFT N/A 08/27/2019   Procedure: CORONARY ARTERY BYPASS GRAFTING (CABG) x7, LIMA TO LAD, SVG TO DIAG 1 WITH SEQUENTIAL SVG TO DIAG 2, SVG TO OM2, SVG TO PDA WITH SEQUENTIAL SVG TO PLB WITH SEQUENTIAL SVG TO OTHER.;  Surgeon: Gaye Pollack, MD;  Location: [facility] OR;  Service: Open Heart Surgery;  Laterality: N/A;  . ENDOVEIN HARVEST OF GREATER SAPHENOUS VEIN Right 08/27/2019   Procedure: [place] Of Greater Saphenous Vein;  Surgeon: Gaye Pollack, MD;  Location: [facility] OR;  Service: Open Heart Surgery;  Laterality: Right;  . LEFT HEART CATH AND CORONARY ANGIOGRAPHY N/A 08/26/2019   Procedure: LEFT HEART CATH AND CORONARY ANGIOGRAPHY;  Surgeon: Nelva Bush, MD;  Location: [facility];  Service: Cardiovascular;  Laterality: N/A;  . right knee repair  1997  . TEE WITHOUT CARDIOVERSION N/A 08/27/2019   Procedure: TRANSESOPHAGEAL ECHOCARDIOGRAM (TEE);  Surgeon: Gaye Pollack, MD;  Location: [facility];  Service: Open Heart Surgery;  Laterality: N/A;  . THYROIDECTOMY     right  . TRIGGER FINGER RELEASE    . TRIGGER FINGER RELEASE  02/23/2011   Procedure: RELEASE TRIGGER FINGER/A-1 PULLEY;  Surgeon: Meredith Pel;  Location: [facility];  Service: Orthopedics;  Laterality: Right;  Right 4th trigger finger release     Current Outpatient Medications  Medication Sig Dispense Refill  . acetaminophen (TYLENOL) 325 MG tablet Take 2 tablets (650 mg total) by mouth every 6 (six) hours as needed for mild pain.    Marland Kitchen aspirin EC 325 MG EC tablet Take 1 tablet (325 mg total) by mouth daily. 30 tablet 0  . blood glucose meter kit and supplies KIT Dispense based on patient and insurance preference. Use up to 3 times daily as directed. (FOR  ICD-10-E11.9 and Z79.4). (Patient taking differently: Inject 1 each into the skin See admin instructions. Dispense based on patient and insurance preference. Use up to 3 times daily as directed. (FOR ICD-10-E11.9 and Z79.4).) 1 each 0  . citalopram (CELEXA) 40 MG tablet Take 1 tablet Daily for Mood 90 tablet 0  . Continuous Blood Gluc Receiver (FREESTYLE LIBRE 2 READER) DEVI 1 each with 6 refills. Use as directed (Patient taking differently: 1 each by Other route as directed. ) 1 each 6  . Continuous Blood Gluc Sensor (FREESTYLE LIBRE 2 SENSOR) MISC Please supply one box and 6 refills. Use as directed (Patient taking differently: 1 each by Other route See admin instructions. Please supply one box and 6 refills. Use as directed) 1 each 6  . empagliflozin (JARDIANCE) 25 MG TABS tablet Take 25 mg by mouth daily.     . insulin degludec (TRESIBA FLEXTOUCH) 100 UNIT/ML FlexTouch Pen Inject 30 Units into the skin daily.    Marland Kitchen levothyroxine (SYNTHROID) 137 MCG tablet Take 1 tablet daily on an empty stomach with only water for 30 minutes & no Antacid meds, Calcium or Magnesium for 4 hours & avoid Biotin (Patient taking differently: Take 137 mcg by mouth daily before breakfast. ) 90 tablet 0  . metFORMIN (GLUCOPHAGE-XR) 500 MG 24 hr tablet Take 2 tablets 2 x  /day with Meals for Diabetes (Patient taking differently: Take 1,000 mg by mouth 2 (two) times daily with a meal. ) 360 tablet 0  . omeprazole (PRILOSEC) 40 MG capsule Take 1 capsule Daily for Indigestion & Reflux (Patient taking differently: Take 40 mg by mouth in the morning and at bedtime. ) 30 capsule 3  . rosuvastatin (CRESTOR) 40 MG tablet Take 1 tablet Daily for Cholesterol (Patient taking differently: Take 40 mg by mouth daily. ) 90 tablet 0  . VITAMIN D PO Take 10,000 Units by mouth daily.      No current facility-administered medications for this visit.    Allergies:   Patient has no known allergies.    Social History:  The patient  reports  that he has never smoked. He has never used smokeless tobacco. He reports current alcohol use. He reports current drug use. Drug: Marijuana.   Family History:  The patient's family history includes Breast cancer in his mother; Heart attack in his father; Osteoarthritis in his father; Skin cancer in his father.    ROS: All other systems are reviewed and negative. Unless otherwise mentioned in H&P    PHYSICAL EXAM: VS:  BP 118/72   Pulse 91   Ht '5\' 10"'$  (1.778 m)   Wt 209 lb 9.6 oz (95.1 kg)   BMI 30.07 kg/m  , BMI Body mass index is 30.07 kg/m. GEN: Well nourished, well developed, in no acute distress HEENT: normal Neck: no JVD, carotid bruits, or masses Cardiac: *RRR; no murmurs, rubs, or gallops,no edema  Respiratory:  Clear to auscultation bilaterally, normal work of breathing GI: soft, nontender, nondistended, + BS MS: no deformity or atrophy well-healed sternotomy incision, chest tube incisions, right SVG harvest site.  Left upper arm blood glucose monitor site indebted. Skin: warm and dry, no rash Neuro:  Strength and sensation are intact Psych: euthymic mood, full affect   EKG: Normal sinus rhythm with nonspecific T wave abnormality heart rate 91 bpm (personally reviewed)  Recent Labs: 05/31/2019: TSH 4.50 08/25/2019: ALT 36 08/28/2019: Magnesium 2.0 09/06/2019: BUN 24; Creatinine, Ser 1.13; Hemoglobin 11.3; Platelets 386; Potassium 4.5; Sodium 133    Lipid Panel    Component Value Date/Time   CHOL 122 08/26/2019 0749   TRIG 116 08/26/2019 0749   HDL 41 08/26/2019 0749   CHOLHDL 3.0 08/26/2019 0749   VLDL 23 08/26/2019 0749   LDLCALC 58 08/26/2019 0749   LDLCALC  05/31/2019 0947     Comment:     . LDL cholesterol not calculated. Triglyceride levels greater than 400 mg/dL invalidate calculated LDL results. . Reference range: <100 . Desirable range <100 mg/dL for primary prevention;   <70 mg/dL for patients with CHD or diabetic patients  with > or = 2 CHD risk  factors. Marland Kitchen LDL-C is now calculated using the Martin-Hopkins  calculation, which is a validated novel method providing  better accuracy than the Friedewald equation in the  estimation of LDL-C.  Cresenciano Genre et al. Annamaria Helling. 7619;509(32): 2061-2068  (http://education.[employer].com/faq/FAQ164)    LDLDIRECT 120.3 11/07/2006 1321      Wt Readings from Last 3 Encounters:  10/02/19 209 lb 9.6 oz (95.1 kg)  09/30/19 210 lb (95.3 kg)  09/06/19 207 lb (93.9 kg)      Other studies Reviewed: PRE-OP FINDINGS  Left Ventricle: The left ventricle has mildly reduced systolic function,  with an ejection fraction of 45-50%. The cavity size was normal. There is  no increase in left ventricular wall thickness. The LV cavity was normal  in size with normal wall  thickness. There was akinesis of the distal anterior wall, anterior  septum, and apex. The ejection fraction was estimated at 45-50% using the  Simpson's method. On the post-bypass exam, the LV function appeared  unchanged from the pre-bypass exam. The  ejection fraction was estimated at 45-50%.   Right Ventricle: The right ventricle has normal systolic function. The  cavity was normal. There is no increase in right ventricular wall  thickness.   Left Atrium: Left atrial size was dilated and measured 4.3 cm in diameter.  The left atrial appendage is well visualized and there is no evidence of  thrombus present.   Right Atrium: Right atrial size was normal in size. The interatrial septum  is seen bowed toward the right, consistent with elevated left atrial  pressures.   Interatrial Septum: Evidence of atrial level shunting detected by color  flow Doppler. There was a patent foramen ovale present with continuous  left to right flow. This appeared hemodynamically insignificant.   Pericardium: There is no evidence of pericardial effusion.   Mitral Valve: The mitral valve is normal in structure. No thickening of  the mitral valve  leaflet. No calcification of the mitral valve leaflet.  Mitral valve regurgitation is trivial by color flow Doppler. There is No  evidence of mitral stenosis.   Tricuspid Valve: The tricuspid valve was normal in structure. Tricuspid  valve regurgitation is trivial by color flow Doppler. There is no evidence  of tricuspid valve vegetation.   Aortic Valve:  The aortic valve is tricuspid There is mild thickening of  the aortic valve Aortic valve regurgitation was not visualized by color  flow Doppler. There is no evidence of aortic valve stenosis. There is no  evidence of a vegetation on the  aortic valve.   Pulmonic Valve: The pulmonic valve was normal in structure, with normal.  No evidence of pumonic stenosis.  Pulmonic valve regurgitation is trivial by color flow Doppler.    ASSESSMENT AND PLAN:  1.  Coronary artery disease: Status post coronary artery bypass grafting x7 on 08/26/2019.  Is recovering well without recurrent discomfort, palpitations, or significant fatigue.  Sternotomy site is very well-healed.  He has been taken off of beta-blocker due to hypotension.  Heart rate is elevated per EKG, but on auscultation heart rate is about 78 bpm.  Would not restart him on beta-blocker at this time.  Blood pressure is very well controlled without beta-blocker at 118/72.  He will follow-up with CVTS on appointment previously scheduled for November 06, 2019.  2.  Chronic combined systolic diastolic heart failure: He is due to have a repeat echocardiogram in August 2021.  Most recent echo revealed an EF of 40% to 45% with grade 2 diastolic dysfunction.  He does not have evidence of volume overload at this time.  He is not on any diuretics.  Of note he is on Jardiance.  3.  Non-insulin-dependent diabetes: He is on Jardiance and Metformin.  Followed by PCP/endocrinologist.  4.  Hyperlipidemia: Continues on rosuvastatin 40 mg daily.  He is given refills.  Goal of LDL less than 70.  Follow-up labs  should be completed on next office visit if not completed by PCP who he plans to see in September 2021  Current medicines are reviewed at length with the patient today.  I have spent 25 minutes  dedicated to the care of this patient on the date of this encounter to include pre-visit review of records, assessment, management and diagnostic testing,with shared decision making.  Labs/ tests ordered today include: None  Phill Myron. West Pugh, ANP, AACC   10/02/2019 4:04 PM    [facility] [place] [address] Office [phone] Fax [phone]  Notice: This dictation was prepared with Dragon dictation along with smaller phrase technology. Any transcriptional errors that result from this process are unintentional and may not be corrected upon review.

## 2019-10-15 ENCOUNTER — Encounter (HOSPITAL_COMMUNITY): Payer: Self-pay

## 2019-10-15 ENCOUNTER — Telehealth (HOSPITAL_COMMUNITY): Payer: Self-pay

## 2019-10-15 NOTE — Telephone Encounter (Signed)
Attempted to call patient in regards to Cardiac Rehab - LM on VM Mailed letter 

## 2019-11-05 ENCOUNTER — Other Ambulatory Visit: Payer: Self-pay | Admitting: Internal Medicine

## 2019-11-06 ENCOUNTER — Ambulatory Visit (INDEPENDENT_AMBULATORY_CARE_PROVIDER_SITE_OTHER): Payer: Self-pay | Admitting: Surgery

## 2019-11-06 ENCOUNTER — Other Ambulatory Visit: Payer: Self-pay

## 2019-11-06 ENCOUNTER — Telehealth (HOSPITAL_COMMUNITY): Payer: Self-pay | Admitting: Student-PharmD

## 2019-11-06 ENCOUNTER — Encounter: Payer: Self-pay | Admitting: Surgery

## 2019-11-06 VITALS — BP 129/86 | HR 90 | Temp 97.6°F | Resp 20 | Ht 70.0 in | Wt 216.0 lb

## 2019-11-06 DIAGNOSIS — Z951 Presence of aortocoronary bypass graft: Secondary | ICD-10-CM

## 2019-11-06 DIAGNOSIS — I2510 Atherosclerotic heart disease of native coronary artery without angina pectoris: Secondary | ICD-10-CM

## 2019-11-06 NOTE — Progress Notes (Signed)
HPI: Patient returns for routine postoperative follow-up having undergone coronary artery bypass graft surgery x7 on 08/27/2019. The patient's early postoperative recovery while in the hospital was notable for an uncomplicated postoperative course. Since hospital discharge the patient reports that he has been feeling well. He is walking a mile 3 times per week without any difficulty.   Current Outpatient Medications  Medication Sig Dispense Refill  . aspirin EC 325 MG EC tablet Take 1 tablet (325 mg total) by mouth daily. 30 tablet 0  . blood glucose meter kit and supplies KIT Dispense based on patient and insurance preference. Use up to 3 times daily as directed. (FOR ICD-10-E11.9 and Z79.4). (Patient taking differently: Inject 1 each into the skin See admin instructions. Dispense based on patient and insurance preference. Use up to 3 times daily as directed. (FOR ICD-10-E11.9 and Z79.4).) 1 each 0  . citalopram (CELEXA) 40 MG tablet Take 1 tablet Daily for Mood 90 tablet 0  . Continuous Blood Gluc Receiver (FREESTYLE LIBRE 2 READER) DEVI 1 each with 6 refills. Use as directed (Patient taking differently: 1 each by Other route as directed. ) 1 each 6  . Continuous Blood Gluc Sensor (FREESTYLE LIBRE 2 SENSOR) MISC Please supply one box and 6 refills. Use as directed (Patient taking differently: 1 each by Other route See admin instructions. Please supply one box and 6 refills. Use as directed) 1 each 6  . empagliflozin (JARDIANCE) 25 MG TABS tablet Take 25 mg by mouth daily.     . insulin degludec (TRESIBA FLEXTOUCH) 100 UNIT/ML FlexTouch Pen Inject 30 Units into the skin daily.    Marland Kitchen levothyroxine (SYNTHROID) 137 MCG tablet 1 TAB ON EMPTY STOMACH ONLY WATER FOR 30 MINS.NO ANTACIDS, CALC, MAGN, BIOTIN FOR 4 HRS 30 tablet 0  . metFORMIN (GLUCOPHAGE-XR) 500 MG 24 hr tablet Take 2 tablets 2 x  /day with Meals for Diabetes (Patient taking differently: Take 1,000 mg by mouth 2 (two) times daily with  a meal. ) 360 tablet 0  . omeprazole (PRILOSEC) 40 MG capsule Take 1 capsule Daily for Indigestion & Reflux (Patient taking differently: Take 40 mg by mouth in the morning and at bedtime. ) 30 capsule 3  . rosuvastatin (CRESTOR) 40 MG tablet TAKE 1 TABLET ONCE DAILY. 30 tablet 0  . VITAMIN D PO Take 10,000 Units by mouth daily.     Marland Kitchen acetaminophen (TYLENOL) 325 MG tablet Take 2 tablets (650 mg total) by mouth every 6 (six) hours as needed for mild pain. (Patient not taking: Reported on 11/06/2019)     No current facility-administered medications for this visit.    Physical Exam: BP 129/86   Pulse 90   Temp 97.6 F (36.4 C) (Skin)   Resp 20   Ht 5' 10" (1.778 m)   Wt 216 lb (98 kg)   SpO2 96%   BMI 30.99 kg/m  He looks well. Cardiac exam shows regular rate and rhythm with normal heart sounds. There is no murmur. Lungs are clear. The chest incision is healing well and the sternum is stable. His leg incisions are healing well and there is no lower extremity edema.  Diagnostic Tests:  None today  Impression:  Overall he is doing very well almost 2-1/2 months following coronary bypass surgery. He feels like his breathing and stamina are much better than they were preoperatively. I encouraged him to continue walking as much possible. I told him that after 3 months his lifting restrictions were  removed.  Plan:  He will continue to follow-up with cardiology and his PCP and will return to see me if he has any problems with his incisions.   Gaye Pollack, MD [facility] [phone]

## 2019-11-11 ENCOUNTER — Telehealth (HOSPITAL_COMMUNITY): Payer: Self-pay

## 2019-11-11 NOTE — Telephone Encounter (Signed)
Cardiac Rehab Note:  Unsuccessful telephone encounter to [name] [name] to confirm his cardiac rehab orientation appointment for 11/14/19 at 9:30 am. [employer] compliant VM message left requesting call back to [phone].  Madia Carvell E. Rollene Rotunda RN, BSN [facility]  Cardiac and Pulmonary Rehabilitation Phone: [phone] Fax: [phone]

## 2019-11-11 NOTE — Telephone Encounter (Signed)
Cardiac Rehab Medication Review by a Pharmacist  Does the patient  feel that his/her medications are working for him/her?  Yes  Has the patient been experiencing any side effects to the medications prescribed?  no  Does the patient measure his/her own blood pressure or blood glucose at home?  Checks blood pressure periodically at home - states that they have been good lately, last was 132/74. Uses Freestyle Libre to check blood sugars.   Does the patient have any problems obtaining medications due to transportation or finances?   no  Understanding of regimen: good Understanding of indications: good Potential of compliance: good    Pharmacist Intervention: Patient reports not taking Tylenol, removed from medication list.    Rebbeca Paul, PharmD PGY1 Pharmacy Resident 11/11/2019 2:10 PM  Please check [email or web address] for unit-specific pharmacy phone numbers.

## 2019-11-13 ENCOUNTER — Telehealth (HOSPITAL_COMMUNITY): Payer: Self-pay | Admitting: *Deleted

## 2019-11-13 NOTE — Telephone Encounter (Signed)
Left message to call cardiac rehab regarding cardiac rehab orientation.Barnet Pall, RN,BSN 11/13/2019 4:29 PM

## 2019-11-14 ENCOUNTER — Encounter (HOSPITAL_COMMUNITY)
Admission: RE | Admit: 2019-11-14 | Discharge: 2019-11-14 | Disposition: A | Discharge status: Home / Self Care | Payer: Managed Care, Other (non HMO) | Source: Ambulatory Visit | Attending: Cardiovascular Disease | Admitting: Cardiovascular Disease

## 2019-11-14 ENCOUNTER — Other Ambulatory Visit: Payer: Self-pay

## 2019-11-14 ENCOUNTER — Encounter (HOSPITAL_COMMUNITY): Payer: Self-pay

## 2019-11-14 VITALS — BP 118/70 | HR 82 | Ht 70.0 in | Wt 216.5 lb

## 2019-11-14 DIAGNOSIS — Z951 Presence of aortocoronary bypass graft: Secondary | ICD-10-CM | POA: Insufficient documentation

## 2019-11-14 NOTE — Progress Notes (Signed)
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  aspirin EC 325 MG EC tablet, Take 1 tablet (325 mg total) by mouth daily., Disp: 30 tablet, Rfl: 0 .  blood glucose meter kit and supplies KIT, Dispense based on patient and insurance preference. Use up to 3 times daily as directed. (FOR ICD-10-E11.9 and Z79.4). (Patient taking differently: Inject 1 each into the skin See admin instructions. Dispense based on patient and insurance preference. Use up to 3 times daily as directed. (FOR ICD-10-E11.9 and Z79.4).), Disp: 1 each, Rfl: 0 .  citalopram (CELEXA) 40 MG tablet, Take 1 tablet Daily for Mood, Disp: 90 tablet, Rfl: 0 .  Continuous Blood Gluc Receiver (FREESTYLE LIBRE 2 READER) DEVI, 1 each with 6 refills. Use as directed (Patient taking differently: 1 each by Other route as directed. ), Disp: 1 each, Rfl: 6 .  Continuous Blood Gluc Sensor (FREESTYLE LIBRE 2 SENSOR) MISC, Please supply one box and 6 refills. Use as directed (Patient taking differently: 1 each by Other route See admin instructions. Please supply one box and 6 refills. Use as directed), Disp: 1 each, Rfl: 6 .  empagliflozin (JARDIANCE) 25 MG TABS tablet, Take 25 mg by mouth daily. , Disp: , Rfl:  .  levothyroxine (SYNTHROID) 137 MCG tablet, 1 TAB ON EMPTY STOMACH ONLY WATER FOR 30 MINS.NO ANTACIDS, CALC, MAGN, BIOTIN FOR 4 HRS, Disp: 30 tablet, Rfl: 0 .  metFORMIN (GLUCOPHAGE-XR) 500 MG 24 hr tablet, Take 2  tablets 2 x  /day with Meals for Diabetes (Patient taking differently: Take 1,000 mg by mouth 2 (two) times daily with a meal. ), Disp: 360 tablet, Rfl: 0 .  omeprazole (PRILOSEC) 40 MG capsule, Take 1 capsule Daily for Indigestion & Reflux (Patient taking differently: Take 40 mg by mouth daily. ), Disp: 30 capsule, Rfl: 3 .  rosuvastatin (CRESTOR) 40 MG tablet, TAKE 1 TABLET ONCE DAILY., Disp: 30 tablet, Rfl: 0 .  VITAMIN D PO, Take 10,000 Units by mouth daily. , Disp: , Rfl:  .  insulin degludec (TRESIBA FLEXTOUCH) 100 UNIT/ML FlexTouch Pen, Inject 30 Units into the skin daily., Disp: , Rfl:   Past Medical History: Past Medical History:  Diagnosis Date  . Anxiety   . ASTHMA 10/28/2006  . Coronary artery disease   . Cough   . Diabetes mellitus type II 2000   age 58  . GERD (gastroesophageal reflux disease)   . Hyperlipidemia   . Hypertension 1995  . Hypothyroidism 2002   thyroidectomy for Goiter  . Lumbar back pain   . NASH (nonalcoholic steatohepatitis)   . Numbness   . OSA (obstructive sleep apnea) 11/19/2010  . Sleep apnea    Pt states mild case, doesn't use cpap    Tobacco Use: Social History   Tobacco Use  Smoking Status Never Smoker  Smokeless Tobacco Never Used    Labs: Recent Review Advice worker    Labs for ITP Cardiac and Pulmonary Rehab Latest Ref Rng &  Units 08/27/2019 08/27/2019 08/27/2019 08/27/2019 08/27/2019   Cholestrol 0 - 200 mg/dL - - - - -   LDLCALC 0 - 99 mg/dL - - - - -   LDLDIRECT mg/dL - - - - -   HDL >40 mg/dL - - - - -   Trlycerides <150 mg/dL - - - - -   Hemoglobin A1c 4.8 - 5.6 % - - - - -   PHART 7.35 - 7.45 7.434 - 7.416 7.329(L) 7.338(L)   PCO2ART 32 - 48 mmHg 36.3 - 33.2 45.3 49.3(H)   HCO3 20.0 - 28.0 mmol/L 24.3 - 21.5 23.8 26.3   TCO2 22 - 32 mmol/L '25 25 23 25 28   '$ ACIDBASEDEF 0.0 - 2.0 mmol/L - - 3.0(H) 2.0 -   O2SAT % 95.0 - 98.0 98.0 98.0      Capillary Blood Glucose: Lab Results  Component Value Date   GLUCAP 155 (H)  09/06/2019   GLUCAP 230 (H) 09/01/2019   GLUCAP 189 (H) 09/01/2019   GLUCAP 228 (H) 08/31/2019   GLUCAP 315 (H) 08/31/2019     Exercise Target Goals: Exercise Program Goal: Individual exercise prescription set using results from initial 6 min walk test and THRR while considering  patient's activity barriers and safety.   Exercise Prescription Goal: Starting with aerobic activity 30 plus minutes a day, 3 days per week for initial exercise prescription. Provide home exercise prescription and guidelines that participant acknowledges understanding prior to discharge.  Activity Barriers & Risk Stratification:  Activity Barriers & Cardiac Risk Stratification - 11/14/19 1107      Activity Barriers & Cardiac Risk Stratification   Activity Barriers None    Cardiac Risk Stratification High           6 Minute Walk:  6 Minute Walk    Row Name 11/14/19 1105         6 Minute Walk   Phase Initial     Distance 1430 feet     Walk Time 6 minutes     # of Rest Breaks 0     MPH 2.71     METS 3.7     RPE 11     Perceived Dyspnea  0     VO2 Peak 12.9     Symptoms No     Resting HR 82 bpm     Resting BP 118/70     Resting Oxygen Saturation  96 %     Exercise Oxygen Saturation  during 6 min walk 97 %     Max Ex. HR 107 bpm     Max Ex. BP 120/70     2 Minute Post BP 116/68            Oxygen Initial Assessment:   Oxygen Re-Evaluation:   Oxygen Discharge (Final Oxygen Re-Evaluation):   Initial Exercise Prescription:  Initial Exercise Prescription - 11/14/19 1100      Date of Initial Exercise RX and Referring Provider   Date 11/14/19    Referring Provider Skeet Latch MD    Expected Discharge Date 01/10/20      Treadmill   MPH 2.3    Grade 1    Minutes 15    METs 3.08      NuStep   Level 2    SPM 75    Minutes 15    METs 2.5      Prescription Details   Frequency (times per week) 3x    Duration Progress to 30 minutes of  continuous aerobic without  signs/symptoms of physical distress      Intensity   THRR 40-80% of Max Heartrate 66-132    Ratings of Perceived Exertion 11-13    Perceived Dyspnea 0-4      Progression   Progression Continue progressive overload as per policy without signs/symptoms or physical distress.      Resistance Training   Training Prescription Yes    Weight 4lbs    Reps 10-15           Perform Capillary Blood Glucose checks as needed.  Exercise Prescription Changes:   Exercise Comments:   Exercise Goals and Review:   Exercise Goals    Row Name 11/14/19 1106             Exercise Goals   Increase Physical Activity Yes       Intervention Provide advice, education, support and counseling about physical activity/exercise needs.;Develop an individualized exercise prescription for aerobic and resistive training based on initial evaluation findings, risk stratification, comorbidities and participant's personal goals.       Expected Outcomes Long Term: Add in home exercise to make exercise part of routine and to increase amount of physical activity.;Short Term: Attend rehab on a regular basis to increase amount of physical activity.;Long Term: Exercising regularly at least 3-5 days a week.       Increase Strength and Stamina Yes       Intervention Provide advice, education, support and counseling about physical activity/exercise needs.;Develop an individualized exercise prescription for aerobic and resistive training based on initial evaluation findings, risk stratification, comorbidities and participant's personal goals.       Expected Outcomes Short Term: Increase workloads from initial exercise prescription for resistance, speed, and METs.;Short Term: Perform resistance training exercises routinely during rehab and add in resistance training at home;Long Term: Improve cardiorespiratory fitness, muscular endurance and strength as measured by increased METs and functional capacity (6MWT)       Able to  understand and use rate of perceived exertion (RPE) scale Yes       Intervention Provide education and explanation on how to use RPE scale       Expected Outcomes Short Term: Able to use RPE daily in rehab to express subjective intensity level;Long Term:  Able to use RPE to guide intensity level when exercising independently       Knowledge and understanding of Target Heart Rate Range (THRR) Yes       Intervention Provide education and explanation of THRR including how the numbers were predicted and where they are located for reference       Expected Outcomes Short Term: Able to state/look up THRR;Long Term: Able to use THRR to govern intensity when exercising independently;Short Term: Able to use daily as guideline for intensity in rehab       Able to check pulse independently Yes       Intervention Provide education and demonstration on how to check pulse in carotid and radial arteries.;Review the importance of being able to check your own pulse for safety during independent exercise       Expected Outcomes Short Term: Able to explain why pulse checking is important during independent exercise;Long Term: Able to check pulse independently and accurately       Understanding of Exercise Prescription Yes       Intervention Provide education, explanation, and written materials on patient's individual exercise prescription       Expected Outcomes Short Term: Able to explain program exercise  prescription;Long Term: Able to explain home exercise prescription to exercise independently              Exercise Goals Re-Evaluation :    Discharge Exercise Prescription (Final Exercise Prescription Changes):   Nutrition:  Target Goals: Understanding of nutrition guidelines, daily intake of sodium '1500mg'$ , cholesterol '200mg'$ , calories 30% from fat and 7% or less from saturated fats, daily to have 5 or more servings of fruits and vegetables.  Biometrics:  Pre Biometrics - 11/14/19 1106      Pre  Biometrics   Height '5\' 10"'$  (1.778 m)    Weight 98.2 kg    Waist Circumference 43 inches    Hip Circumference 42 inches    Waist to Hip Ratio 1.02 %    BMI (Calculated) 31.06    Triceps Skinfold 12 mm    % Body Fat 28.7 %    Grip Strength 39 kg    Flexibility 17 in    Single Leg Stand 18.87 seconds            Nutrition Therapy Plan and Nutrition Goals:   Nutrition Assessments:   Nutrition Goals Re-Evaluation:   Nutrition Goals Discharge (Final Nutrition Goals Re-Evaluation):   Psychosocial: Target Goals: Acknowledge presence or absence of significant depression and/or stress, maximize coping skills, provide positive support system. Participant is able to verbalize types and ability to use techniques and skills needed for reducing stress and depression.  Initial Review & Psychosocial Screening:  Initial Psych Review & Screening - 11/14/19 1151      Initial Review   Current issues with None Identified      Family Dynamics   Good Support System? Yes   Ronalee Belts has his wife for support     Barriers   Psychosocial barriers to participate in program There are no identifiable barriers or psychosocial needs.      Screening Interventions   Interventions Encouraged to exercise           Quality of Life Scores:  Quality of Life - 11/14/19 1118      Quality of Life   Select Quality of Life      Quality of Life Scores   Health/Function Pre 26.6 %    Socioeconomic Pre 29.06 %    Psych/Spiritual Pre 30 %    Family Pre 30 %    GLOBAL Pre 29.61 %          Scores of 19 and below usually indicate a poorer quality of life in these areas.  A difference of  2-3 points is a clinically meaningful difference.  A difference of 2-3 points in the total score of the Quality of Life Index has been associated with significant improvement in overall quality of life, self-image, physical symptoms, and general health in studies assessing change in quality of life.  PHQ-9: Recent  Review Flowsheet Data    Depression screen [facility] 2/9 11/14/2019 05/30/2019 05/09/2018 05/06/2018 10/15/2017   Decreased Interest 0 0 0 0 0   Down, Depressed, Hopeless 0 0 0 0 0   PHQ - 2 Score 0 0 0 0 0     Interpretation of Total Score  Total Score Depression Severity:  1-4 = Minimal depression, 5-9 = Mild depression, 10-14 = Moderate depression, 15-19 = Moderately severe depression, 20-27 = Severe depression   Psychosocial Evaluation and Intervention:   Psychosocial Re-Evaluation:   Psychosocial Discharge (Final Psychosocial Re-Evaluation):   Vocational Rehabilitation: Provide vocational rehab assistance to qualifying candidates.   Vocational  Rehab Evaluation & Intervention:  Vocational Rehab - 11/14/19 1152      Initial Vocational Rehab Evaluation & Intervention   Assessment shows need for Vocational Rehabilitation No   Ronalee Belts works part time and does not need vocational rehab at this time          Education: Education Goals: Education classes will be provided on a weekly basis, covering required topics. Participant will state understanding/return demonstration of topics presented.  Learning Barriers/Preferences:  Learning Barriers/Preferences - 11/14/19 1118      Learning Barriers/Preferences   Learning Barriers Sight    Learning Preferences Written Material;Individual Instruction           Education Topics: Hypertension, Hypertension Reduction -Define heart disease and high blood pressure. Discus how high blood pressure affects the body and ways to reduce high blood pressure.   Exercise and Your Heart -Discuss why it is important to exercise, the FITT principles of exercise, normal and abnormal responses to exercise, and how to exercise safely.   Angina -Discuss definition of angina, causes of angina, treatment of angina, and how to decrease risk of having angina.   Cardiac Medications -Review what the following cardiac medications are used for, how they affect  the body, and side effects that may occur when taking the medications.  Medications include Aspirin, Beta blockers, calcium channel blockers, ACE Inhibitors, angiotensin receptor blockers, diuretics, digoxin, and antihyperlipidemics.   Congestive Heart Failure -Discuss the definition of CHF, how to live with CHF, the signs and symptoms of CHF, and how keep track of weight and sodium intake.   Heart Disease and Intimacy -Discus the effect sexual activity has on the heart, how changes occur during intimacy as we age, and safety during sexual activity.   Smoking Cessation / COPD -Discuss different methods to quit smoking, the health benefits of quitting smoking, and the definition of COPD.   Nutrition I: Fats -Discuss the types of cholesterol, what cholesterol does to the heart, and how cholesterol levels can be controlled.   Nutrition II: Labels -Discuss the different components of food labels and how to read food label   Heart Parts/Heart Disease and PAD -Discuss the anatomy of the heart, the pathway of blood circulation through the heart, and these are affected by heart disease.   Stress I: Signs and Symptoms -Discuss the causes of stress, how stress may lead to anxiety and depression, and ways to limit stress.   Stress II: Relaxation -Discuss different types of relaxation techniques to limit stress.   Warning Signs of Stroke / TIA -Discuss definition of a stroke, what the signs and symptoms are of a stroke, and how to identify when someone is having stroke.   Knowledge Questionnaire Score:  Knowledge Questionnaire Score - 11/14/19 1124      Knowledge Questionnaire Score   Pre Score 23/24           Core Components/Risk Factors/Patient Goals at Admission:  Personal Goals and Risk Factors at Admission - 11/14/19 1119      Core Components/Risk Factors/Patient Goals on Admission    Weight Management Yes;Weight Loss    Intervention Weight Management: Develop a  combined nutrition and exercise program designed to reach desired caloric intake, while maintaining appropriate intake of nutrient and fiber, sodium and fats, and appropriate energy expenditure required for the weight goal.;Weight Management: Provide education and appropriate resources to help participant work on and attain dietary goals.;Weight Management/Obesity: Establish reasonable short term and long term weight goals.;Obesity: Provide education and appropriate resources  to help participant work on and attain dietary goals.    Admit Weight 216 lb 7.9 oz (98.2 kg)    Goal Weight: Long Term 200 lb (90.7 kg)    Expected Outcomes Short Term: Continue to assess and modify interventions until short term weight is achieved;Long Term: Adherence to nutrition and physical activity/exercise program aimed toward attainment of established weight goal;Weight Loss: Understanding of general recommendations for a balanced deficit meal plan, which promotes 1-2 lb weight loss per week and includes a negative energy balance of [id number] kcal/d;Understanding recommendations for meals to include 15-35% energy as protein, 25-35% energy from fat, 35-60% energy from carbohydrates, less than '200mg'$  of dietary cholesterol, 20-35 gm of total fiber daily;Understanding of distribution of calorie intake throughout the day with the consumption of 4-5 meals/snacks    Diabetes Yes    Intervention Provide education about proper nutrition, including hydration, and aerobic/resistive exercise prescription along with prescribed medications to achieve blood glucose in normal ranges: Fasting glucose 65-99 mg/dL;Provide education about signs/symptoms and action to take for hypo/hyperglycemia.    Expected Outcomes Short Term: Participant verbalizes understanding of the signs/symptoms and immediate care of hyper/hypoglycemia, proper foot care and importance of medication, aerobic/resistive exercise and nutrition plan for blood glucose control.;Long  Term: Attainment of HbA1C < 7%.    Lipids Yes    Intervention Provide education and support for participant on nutrition & aerobic/resistive exercise along with prescribed medications to achieve LDL '70mg'$ , HDL >'40mg'$ .    Expected Outcomes Short Term: Participant states understanding of desired cholesterol values and is compliant with medications prescribed. Participant is following exercise prescription and nutrition guidelines.;Long Term: Cholesterol controlled with medications as prescribed, with individualized exercise RX and with personalized nutrition plan. Value goals: LDL < '70mg'$ , HDL > 40 mg.           Core Components/Risk Factors/Patient Goals Review:    Core Components/Risk Factors/Patient Goals at Discharge (Final Review):    ITP Comments:  ITP Comments    Row Name 11/14/19 1102           ITP Comments Dr. Fransico Him, Medical Director              Comments: Ronalee Belts attended orientation on 11/14/2019 to review rules and guidelines for program.  Completed 6 minute walk test, Intitial ITP, and exercise prescription.  VSS. Telemetry-Sinus Rhythm.  Asymptomatic. Safety measures and social distancing in place per CDC guidelines.Barnet Pall, RN,BSN 11/14/2019 11:55 AM

## 2019-11-18 ENCOUNTER — Encounter (HOSPITAL_COMMUNITY)
Admission: RE | Admit: 2019-11-18 | Discharge: 2019-11-18 | Disposition: A | Discharge status: Home / Self Care | Payer: Managed Care, Other (non HMO) | Source: Ambulatory Visit | Attending: Cardiovascular Disease | Admitting: Cardiovascular Disease

## 2019-11-18 ENCOUNTER — Other Ambulatory Visit: Payer: Self-pay

## 2019-11-18 ENCOUNTER — Other Ambulatory Visit: Payer: Self-pay | Admitting: Physician Assistant

## 2019-11-18 ENCOUNTER — Other Ambulatory Visit: Payer: Self-pay | Admitting: Internal Medicine

## 2019-11-18 DIAGNOSIS — Z951 Presence of aortocoronary bypass graft: Secondary | ICD-10-CM | POA: Diagnosis not present

## 2019-11-18 LAB — GLUCOSE, CAPILLARY
Glucose-Capillary: 253 mg/dL — ABNORMAL HIGH (ref 70–99)
Glucose-Capillary: 247 mg/dL — ABNORMAL HIGH (ref 70–99)

## 2019-11-18 NOTE — Progress Notes (Addendum)
Daily Session Note  Patient Details  Name: [name] [name] MRN: [id number] Date of Birth: 1965-10-22 Referring Provider:     [place] from 11/14/2019 in [place]  Referring Provider Skeet Latch MD      Encounter Date: 11/18/2019  Check In:  Session Check In - 11/18/19 1157      Check-In   Supervising physician immediately available to respond to emergencies [facility] immediately available    Physician(s) Dr. Alfredia Ferguson    Location [facility]    Staff Present Barnet Pall, RN, BSN;Portia Rollene Rotunda, RN, Mosie Epstein, MS,ACSM CEP, Exercise Physiologist;David Makemson, MS, EP-C, CCRP    Virtual Visit No    Medication changes reported     No    Fall or balance concerns reported    No    Tobacco Cessation No Change    Warm-up and Cool-down Performed on first and last piece of equipment    Resistance Training Performed Yes    VAD Patient? No    PAD/SET Patient? No      Pain Assessment   Currently in Pain? No/denies    Pain Score 0-No pain    Multiple Pain Sites No           Capillary Blood Glucose: Results for orders placed or performed during the hospital encounter of 11/18/19 (from the past 24 hour(s))  Glucose, capillary     Status: Abnormal   Collection Time: 11/18/19 11:00 AM  Result Value Ref Range   Glucose-Capillary 253 (H) 70 - 99 mg/dL  Glucose, capillary     Status: Abnormal   Collection Time: 11/18/19 11:51 AM  Result Value Ref Range   Glucose-Capillary 247 (H) 70 - 99 mg/dL     Exercise Prescription Changes - 11/18/19 1300      Response to Exercise   Blood Pressure (Admit) 126/74    Blood Pressure (Exercise) 142/70    Blood Pressure (Exit) 130/70    Heart Rate (Admit) 99 bpm    Heart Rate (Exercise) 121 bpm    Heart Rate (Exit) 103 bpm    Rating of Perceived Exertion (Exercise) 11    Perceived Dyspnea (Exercise) 0    Symptoms None    Comments Pt's first day  of exercise    Duration Progress to 30 minutes of  aerobic without signs/symptoms of physical distress    Intensity THRR unchanged      Progression   Progression Continue to progress workloads to maintain intensity without signs/symptoms of physical distress.    Average METs 2.8      Resistance Training   Training Prescription Yes    Weight 4lbs    Reps 10-15    Time 10 Minutes      Interval Training   Interval Training No      Treadmill   MPH 2.3    Grade 1    Minutes 15    METs 3.08      NuStep   Level 2    SPM 75    Minutes 15    METs 2.5           Social History   Tobacco Use  Smoking Status Never Smoker  Smokeless Tobacco Never Used    Goals Met:  Exercise tolerated well No report of cardiac concerns or symptoms Strength training completed today  Goals Unmet:  Not Applicable  Comments: Ronalee Belts started cardiac rehab today.  Pt tolerated light exercise without difficulty.  VSS, telemetry-Sinus Rhythm, asymptomatic.  Medication list reconciled. Pt denies barriers to medicaiton compliance.  PSYCHOSOCIAL ASSESSMENT:  PHQ-0. Pt exhibits positive coping skills, hopeful outlook with supportive family. No psychosocial needs identified at this time, no psychosocial interventions necessary.    Pt enjoys taking the boat to the lake, watching movies and traveling.   Pt oriented to exercise equipment and routine.    Understanding verbalized.Barnet Pall, RN,BSN 11/19/2019 8:29 AM   Dr. Fransico Him is Medical Director for Cardiac Rehab at [facility].

## 2019-11-20 ENCOUNTER — Encounter (HOSPITAL_COMMUNITY)
Admission: RE | Admit: 2019-11-20 | Discharge: 2019-11-20 | Disposition: A | Discharge status: Home / Self Care | Payer: Managed Care, Other (non HMO) | Source: Ambulatory Visit | Attending: Cardiovascular Disease | Admitting: Cardiovascular Disease

## 2019-11-20 ENCOUNTER — Other Ambulatory Visit: Payer: Self-pay

## 2019-11-20 DIAGNOSIS — Z951 Presence of aortocoronary bypass graft: Secondary | ICD-10-CM

## 2019-11-22 ENCOUNTER — Encounter (HOSPITAL_COMMUNITY): Payer: Managed Care, Other (non HMO)

## 2019-11-25 ENCOUNTER — Encounter (HOSPITAL_COMMUNITY): Payer: Managed Care, Other (non HMO)

## 2019-11-27 ENCOUNTER — Encounter (HOSPITAL_COMMUNITY): Payer: Managed Care, Other (non HMO)

## 2019-11-29 ENCOUNTER — Encounter (HOSPITAL_COMMUNITY): Payer: Managed Care, Other (non HMO)

## 2019-12-02 ENCOUNTER — Encounter (HOSPITAL_COMMUNITY): Payer: Managed Care, Other (non HMO)

## 2019-12-03 ENCOUNTER — Encounter: Payer: Self-pay | Admitting: Internal Medicine

## 2019-12-03 NOTE — Progress Notes (Signed)
History of Present Illness:       This very nice 54 y.o.  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        Patient is treated for HTN 51 (age 15 yo)  & BP has been controlled at home. Today's BP is at goal - 124/86.       Patient was hospitalized in May 2021 undergoing emergent  7V CABG.  Patient has on-going out-pt cardiac rehab & has had no complaints of any cardiac type chest pain, palpitations, dyspnea / orthopnea / PND, dizziness, claudication, or dependent edema.       Hyperlipidemia is controlled with diet & meds. Patient denies myalgias or other med SE's. Last Lipids were at goal:  Lab Results  Component Value Date   CHOL 122 08/26/2019   HDL 41 08/26/2019   LDLCALC 58 08/26/2019   TRIG 116 08/26/2019   CHOLHDL 3.0 08/26/2019    Also, the patient has history of T2_IDDM since age 70 yo (2000) and is treated with Jardiance, Metformin & Tresbia basal insulin.  Generally his diet / med compliance has been poor. Patient has had no symptoms of reactive hypoglycemia, diabetic polys, paresthesias or visual blurring.  Last A1c was not at goal:  Lab Results  Component Value Date   HGBA1C 10.8 (H) 08/25/2019         In 2002, he underwent Thyroidectomy for Goiter and has been on Thyroid Replacement since.            Further, the patient also has history of Vitamin D Deficiency  ("20" / 2017) and supplements vitamin D without any suspected side-effects. Last vitamin D was still very low:  Lab Results  Component Value Date   VD25OH 26 (L) 05/31/2019    Current Outpatient Medications on File Prior to Visit  Medication Sig  . aspirin EC 325 MG EC tablet Take 1 tablet (325 mg total) by mouth daily.  . blood glucose meter kit and supplies KIT Dispense based on patient and insurance preference. Use up to 3 times daily as directed. (FOR ICD-10-E11.9 and Z79.4). (Patient taking differently: Inject 1 each into the  skin See admin instructions. Dispense based on patient and insurance preference. Use up to 3 times daily as directed. (FOR ICD-10-E11.9 and Z79.4).)  . citalopram (CELEXA) 40 MG tablet Take 1 tablet Daily for Mood  . Continuous Blood Gluc Receiver (FREESTYLE LIBRE 2 READER) DEVI 1 each with 6 refills. Use as directed (Patient taking differently: 1 each by Other route as directed. )  . Continuous Blood Gluc Sensor (FREESTYLE LIBRE 2 SENSOR) MISC Please supply one box and 6 refills. Use as directed (Patient taking differently: 1 each by Other route See admin instructions. Please supply one box and 6 refills. Use as directed)  . empagliflozin (JARDIANCE) 25 MG TABS tablet Take 25 mg by mouth daily.   . insulin degludec (TRESIBA FLEXTOUCH) 100 UNIT/ML FlexTouch Pen Inject 30 Units into the skin daily.  Marland Kitchen levothyroxine (SYNTHROID) 137 MCG tablet Take 1 tablet Daily on an empty stomach with only water for 30 minutes & no Antacid meds, Calcium or Magnesium for 4 hours & avoid Biotin  . metFORMIN (GLUCOPHAGE-XR) 500 MG 24 hr tablet Take 2 tablets 2 x  /day with Meals for Diabetes (Patient taking differently: Take 1,000 mg by mouth 2 (two) times daily with a meal. )  . omeprazole (PRILOSEC) 40  MG capsule Take 1 capsule    Daily     to Prevent Heartburn & Indigestion  . rosuvastatin (CRESTOR) 40 MG tablet Take   1 tablet     Daily   for Cholesterol  . VITAMIN D PO Take 10,000 Units by mouth daily.    No current facility-administered medications on file prior to visit.    Not on File  PMHx:   Past Medical History:  Diagnosis Date  . Anxiety   . ASTHMA 10/28/2006  . Coronary artery disease   . Cough   . Diabetes mellitus type II 2000   age 28  . GERD (gastroesophageal reflux disease)   . Hyperlipidemia   . Hypertension 1995  . Hypothyroidism 2002   thyroidectomy for Goiter  . Lumbar back pain   . NASH (nonalcoholic steatohepatitis)   . Numbness   . OSA (obstructive sleep apnea) 11/19/2010  .  Sleep apnea    Pt states mild case, doesn't use cpap    Immunization History  Administered Date(s) Administered  . Influenza Inj Mdck Quad With Preservative 02/20/2017  . Influenza Split 01/16/2014  . PPD Test 10/11/2017, 05/31/2019  . Pneumococcal Polysaccharide-23 08/16/2012  . Tdap 08/10/2015    Past Surgical History:  Procedure Laterality Date  . bilateral shoulder surgery    . CARDIAC CATHETERIZATION    . CORONARY ARTERY BYPASS GRAFT N/A 08/27/2019   Procedure: CORONARY ARTERY BYPASS GRAFTING (CABG) x7, LIMA TO LAD, SVG TO DIAG 1 WITH SEQUENTIAL SVG TO DIAG 2, SVG TO OM2, SVG TO PDA WITH SEQUENTIAL SVG TO PLB WITH SEQUENTIAL SVG TO OTHER.;  Surgeon: Alleen Borne, MD;  Location: [facility] OR;  Service: Open Heart Surgery;  Laterality: N/A;  . ENDOVEIN HARVEST OF GREATER SAPHENOUS VEIN Right 08/27/2019   Procedure: Mack Guise Of Greater Saphenous Vein;  Surgeon: Alleen Borne, MD;  Location: [facility] OR;  Service: Open Heart Surgery;  Laterality: Right;  . LEFT HEART CATH AND CORONARY ANGIOGRAPHY N/A 08/26/2019   Procedure: LEFT HEART CATH AND CORONARY ANGIOGRAPHY;  Surgeon: Yvonne Kendall, MD;  Location: [facility] INVASIVE CV LAB;  Service: Cardiovascular;  Laterality: N/A;  . right knee repair  1997  . TEE WITHOUT CARDIOVERSION N/A 08/27/2019   Procedure: TRANSESOPHAGEAL ECHOCARDIOGRAM (TEE);  Surgeon: Alleen Borne, MD;  Location: [facility] OR;  Service: Open Heart Surgery;  Laterality: N/A;  . THYROIDECTOMY     right  . TRIGGER FINGER RELEASE    . TRIGGER FINGER RELEASE  02/23/2011   Procedure: RELEASE TRIGGER FINGER/A-1 PULLEY;  Surgeon: Cammy Copa;  Location: [facility] OR;  Service: Orthopedics;  Laterality: Right;  Right 4th trigger finger release    FHx:    Reviewed / unchanged  SHx:    Reviewed / unchanged   Systems Review:  Constitutional: Denies fever, chills, wt changes, headaches, insomnia, fatigue, night sweats, change in appetite. Eyes: Denies redness, blurred vision,  diplopia, discharge, itchy, watery eyes.  ENT: Denies discharge, congestion, post nasal drip, epistaxis, sore throat, earache, hearing loss, dental pain, tinnitus, vertigo, sinus pain, snoring.  CV: Denies chest pain, palpitations, irregular heartbeat, syncope, dyspnea, diaphoresis, orthopnea, PND, claudication or edema. Respiratory: denies cough, dyspnea, DOE, pleurisy, hoarseness, laryngitis, wheezing.  Gastrointestinal: Denies dysphagia, odynophagia, heartburn, reflux, water brash, abdominal pain or cramps, nausea, vomiting, bloating, diarrhea, constipation, hematemesis, melena, hematochezia  or hemorrhoids. Genitourinary: Denies dysuria, frequency, urgency, nocturia, hesitancy, discharge, hematuria or flank pain. Musculoskeletal: Denies arthralgias, myalgias, stiffness, jt. swelling, pain, limping or strain/sprain.  Skin: Denies pruritus,  rash, hives, warts, acne, eczema or change in skin lesion(s). Neuro: No weakness, tremor, incoordination, spasms, paresthesia or pain. Psychiatric: Denies confusion, memory loss or sensory loss. Endo: Denies change in weight, skin or hair change.  Heme/Lymph: No excessive bleeding, bruising or enlarged lymph nodes.  Physical Exam  BP 124/86   Pulse 76   Temp (!) 97.5 F (36.4 C)   Resp 16   Ht $R'5\' 11"'xP$  (1.803 m)   Wt 221 lb 6.4 oz (100.4 kg)   BMI 30.88 kg/m   Appears  well nourished, well groomed  and in no distress.  Eyes: PERRLA, EOMs, conjunctiva no swelling or erythema. Sinuses: No frontal/maxillary tenderness ENT/Mouth: EAC's clear, TM's nl w/o erythema, bulging. Nares clear w/o erythema, swelling, exudates. Oropharynx clear without erythema or exudates. Oral hygiene is good. Tongue normal, non obstructing. Hearing intact.  Neck: Supple. Thyroid not palpable. Car 2+/2+ without bruits, nodes or JVD. Chest: Respirations nl with BS clear & equal w/o rales, rhonchi, wheezing or stridor.  Cor: Heart sounds normal w/ regular rate and rhythm without  sig. murmurs, gallops, clicks or rubs. Peripheral pulses normal and equal  without edema.  Abdomen: Soft & bowel sounds normal. Non-tender w/o guarding, rebound, hernias, masses or organomegaly.  Lymphatics: Unremarkable.  Musculoskeletal: Full ROM all peripheral extremities, joint stability, 5/5 strength and normal gait.  Skin: Warm, dry without exposed rashes, lesions or ecchymosis apparent.  Neuro: Cranial nerves intact, reflexes equal bilaterally. Sensory-motor testing grossly intact. Tendon reflexes grossly intact.  Pysch: Alert & oriented x 3.  Insight and judgement nl & appropriate. No ideations.  Assessment and Plan:  1. Essential hypertension  - Continue medication, monitor blood pressure at home.  - Continue DASH diet.  Reminder to go to the ER if any CP,  SOB, nausea, dizziness, severe HA, changes vision/speech.  - CBC with Differential/Platelet - COMPLETE METABOLIC PANEL WITH GFR - Magnesium - TSH  - Continue diet/meds, exercise,& lifestyle modifications.  - Continue monitor periodic cholesterol/liver & renal functions   2. Hyperlipidemia associated with type 2 diabetes mellitus (HCC)  - Lipid panel - TSH  - Continue diet, exercise  - Lifestyle modifications.  - Monitor appropriate labs.  3. Type 2 diabetes mellitus with stage 1 chronic kidney disease,  with long-term current use of insulin (HCC)  - Hemoglobin A1c  4. Vitamin D deficiency  - Continue supplementation.  - VITAMIN D 25 Hydroxy  5. Hypothyroidism  - TSH  6. OSA (obstructive sleep apnea)   7. S/P CABG x 7  - Lipid panel  8. Medication management  - CBC with Differential/Platelet - COMPLETE METABOLIC PANEL WITH GFR - Magnesium - Lipid panel - TSH - Hemoglobin A1c - VITAMIN D 25 Hydroxy         Discussed  regular exercise, BP monitoring, weight control to achieve/maintain BMI less than 25 and discussed med and SE's. Recommended labs to assess and monitor clinical status with  further disposition pending results of labs.  I discussed the assessment and treatment plan with the patient. The patient was provided an opportunity to ask questions and all were answered. The patient agreed with the plan and demonstrated an understanding of the instructions.  I provided over 30 minutes of exam, counseling, chart review and  complex critical decision making.   Kirtland Bouchard, MD

## 2019-12-03 NOTE — Patient Instructions (Signed)

## 2019-12-04 ENCOUNTER — Encounter (HOSPITAL_COMMUNITY)
Admission: RE | Admit: 2019-12-04 | Discharge: 2019-12-04 | Disposition: A | Discharge status: Home / Self Care | Payer: Managed Care, Other (non HMO) | Source: Ambulatory Visit | Attending: Cardiovascular Disease | Admitting: Cardiovascular Disease

## 2019-12-04 ENCOUNTER — Other Ambulatory Visit: Payer: Self-pay

## 2019-12-04 ENCOUNTER — Ambulatory Visit: Payer: Managed Care, Other (non HMO) | Admitting: Internal Medicine

## 2019-12-04 VITALS — BP 124/86 | HR 76 | Temp 97.5°F | Resp 16 | Ht 71.0 in | Wt 221.4 lb

## 2019-12-04 DIAGNOSIS — E559 Vitamin D deficiency, unspecified: Secondary | ICD-10-CM | POA: Diagnosis not present

## 2019-12-04 DIAGNOSIS — Z79899 Other long term (current) drug therapy: Secondary | ICD-10-CM | POA: Diagnosis not present

## 2019-12-04 DIAGNOSIS — Z951 Presence of aortocoronary bypass graft: Secondary | ICD-10-CM | POA: Diagnosis present

## 2019-12-04 DIAGNOSIS — I10 Essential (primary) hypertension: Secondary | ICD-10-CM | POA: Diagnosis not present

## 2019-12-04 DIAGNOSIS — E1169 Type 2 diabetes mellitus with other specified complication: Secondary | ICD-10-CM | POA: Diagnosis not present

## 2019-12-04 DIAGNOSIS — E1122 Type 2 diabetes mellitus with diabetic chronic kidney disease: Secondary | ICD-10-CM

## 2019-12-04 DIAGNOSIS — N181 Chronic kidney disease, stage 1: Secondary | ICD-10-CM

## 2019-12-04 DIAGNOSIS — G4733 Obstructive sleep apnea (adult) (pediatric): Secondary | ICD-10-CM

## 2019-12-04 DIAGNOSIS — E039 Hypothyroidism, unspecified: Secondary | ICD-10-CM

## 2019-12-04 DIAGNOSIS — E785 Hyperlipidemia, unspecified: Secondary | ICD-10-CM

## 2019-12-04 DIAGNOSIS — Z794 Long term (current) use of insulin: Secondary | ICD-10-CM

## 2019-12-04 NOTE — Progress Notes (Signed)
Towanda Octave 54 y.o. male Nutrition Note  Diagnosis:  Past Medical History:  Diagnosis Date  . Anxiety   . ASTHMA 10/28/2006  . Coronary artery disease   . Cough   . Diabetes mellitus type II 2000   age 40  . GERD (gastroesophageal reflux disease)   . Hyperlipidemia   . Hypertension 1995  . Hypothyroidism 2002   thyroidectomy for Goiter  . Lumbar back pain   . NASH (nonalcoholic steatohepatitis)   . Numbness   . OSA (obstructive sleep apnea) 11/19/2010  . Sleep apnea    Pt states mild case, doesn't use cpap     Medications reviewed.   Current Outpatient Medications:  .  aspirin EC 325 MG EC tablet, Take 1 tablet (325 mg total) by mouth daily., Disp: 30 tablet, Rfl: 0 .  blood glucose meter kit and supplies KIT, Dispense based on patient and insurance preference. Use up to 3 times daily as directed. (FOR ICD-10-E11.9 and Z79.4). (Patient taking differently: Inject 1 each into the skin See admin instructions. Dispense based on patient and insurance preference. Use up to 3 times daily as directed. (FOR ICD-10-E11.9 and Z79.4).), Disp: 1 each, Rfl: 0 .  citalopram (CELEXA) 40 MG tablet, Take 1 tablet Daily for Mood, Disp: 90 tablet, Rfl: 0 .  Continuous Blood Gluc Receiver (FREESTYLE LIBRE 2 READER) DEVI, 1 each with 6 refills. Use as directed (Patient taking differently: 1 each by Other route as directed. ), Disp: 1 each, Rfl: 6 .  Continuous Blood Gluc Sensor (FREESTYLE LIBRE 2 SENSOR) MISC, Please supply one box and 6 refills. Use as directed (Patient taking differently: 1 each by Other route See admin instructions. Please supply one box and 6 refills. Use as directed), Disp: 1 each, Rfl: 6 .  empagliflozin (JARDIANCE) 25 MG TABS tablet, Take 25 mg by mouth daily. , Disp: , Rfl:  .  insulin degludec (TRESIBA FLEXTOUCH) 100 UNIT/ML FlexTouch Pen, Inject 30 Units into the skin daily., Disp: , Rfl:  .  levothyroxine (SYNTHROID) 137 MCG tablet, Take 1 tablet Daily on an empty  stomach with only water for 30 minutes & no Antacid meds, Calcium or Magnesium for 4 hours & avoid Biotin, Disp: 90 tablet, Rfl: 0 .  metFORMIN (GLUCOPHAGE-XR) 500 MG 24 hr tablet, Take 2 tablets 2 x  /day with Meals for Diabetes (Patient taking differently: Take 1,000 mg by mouth 2 (two) times daily with a meal. ), Disp: 360 tablet, Rfl: 0 .  omeprazole (PRILOSEC) 40 MG capsule, Take 1 capsule    Daily     to Prevent Heartburn & Indigestion, Disp: 90 capsule, Rfl: 0 .  rosuvastatin (CRESTOR) 40 MG tablet, Take   1 tablet     Daily   for Cholesterol, Disp: 90 tablet, Rfl: 0 .  VITAMIN D PO, Take 10,000 Units by mouth daily. , Disp: , Rfl:    Ht Readings from Last 1 Encounters:  12/04/19 $RemoveB'5\' 11"'pFBfXQCS$  (1.803 m)     Wt Readings from Last 3 Encounters:  12/04/19 221 lb 6.4 oz (100.4 kg)  11/14/19 216 lb 7.9 oz (98.2 kg)  11/06/19 216 lb (98 kg)     There is no height or weight on file to calculate BMI.   Social History   Tobacco Use  Smoking Status Never Smoker  Smokeless Tobacco Never Used     Lab Results  Component Value Date   CHOL 122 08/26/2019   Lab Results  Component Value Date  HDL 41 08/26/2019   Lab Results  Component Value Date   LDLCALC 58 08/26/2019   Lab Results  Component Value Date   TRIG 116 08/26/2019     Lab Results  Component Value Date   HGBA1C 10.8 (H) 08/25/2019     CBG (last 3)  No results for input(s): GLUCAP in the last 72 hours.   Nutrition Note  Spoke with pt. Nutrition Plan and Nutrition Survey goals reviewed with pt.  Pt reports working high stress job prior to retiring and feels that was the reason he did not make healthy diet choices. He ate on the go and would forget meds. After CABGx7, he feels more ready to manage his health. He is taking meds and making healthier diet choices. He now checks CBGs throughout day with freestyle libre CGM. He loves it. He has attended diabetes education and seen multiple dietitians over the years.   Pt  has Type 2 Diabetes. Fasting CBG's reportedly 126 mg/dL. Post prandial usually 150-160 mg/dl but sometimes 200-250 mg/dl.   He eats out often - usually once daily. He is still on the go often and would like food ideas.  He states goal of weight loss. Goal 200 lbs.  He has not tried losing weight in the past.  Pt expressed understanding of the information reviewed.    Nutrition Diagnosis ? Excessive carbohydrate intake related to food preferences and lack of food related knowledge as evidenced by A1c 10.8  Nutrition Intervention ? Pt's individual nutrition plan reviewed with pt. ? Benefits of adopting Heart Healthy diet discussed when Medficts reviewed.   ? Continue client-centered nutrition education by RD, as part of interdisciplinary care.  Goal(s) ? CBG concentrations in the normal range or as close to normal as is safely possible. ? Improved blood glucose control as evidenced by pt's A1c trending from 10.8 toward less than 7.0. ? Pt able to name foods that affect blood glucose  Plan:   Will provide client-centered nutrition education as part of interdisciplinary care  Monitor and evaluate progress toward nutrition goal with team.   Michaele Offer, MS, RDN, LDN

## 2019-12-05 LAB — CBC WITH DIFFERENTIAL/PLATELET
Absolute Monocytes: 544 cells/uL (ref 200–950)
Basophils Absolute: 82 cells/uL (ref 0–200)
Basophils Relative: 1.2 %
Eosinophils Absolute: 224 cells/uL (ref 15–500)
Eosinophils Relative: 3.3 %
HCT: 45.3 % (ref 38.5–50.0)
Hemoglobin: 15.3 g/dL (ref 13.2–17.1)
Lymphs Abs: 1931 cells/uL (ref 850–3900)
MCH: 29.4 pg (ref 27.0–33.0)
MCHC: 33.8 g/dL (ref 32.0–36.0)
MCV: 87.1 fL (ref 80.0–100.0)
MPV: 10.5 fL (ref 7.5–12.5)
Monocytes Relative: 8 %
Neutro Abs: 4019 cells/uL (ref 1500–7800)
Neutrophils Relative %: 59.1 %
Platelets: 223 10*3/uL (ref 140–400)
RBC: 5.2 10*6/uL (ref 4.20–5.80)
RDW: 13.4 % (ref 11.0–15.0)
Total Lymphocyte: 28.4 %
WBC: 6.8 10*3/uL (ref 3.8–10.8)

## 2019-12-05 LAB — COMPLETE METABOLIC PANEL WITH GFR
AG Ratio: 1.5 (calc) (ref 1.0–2.5)
ALT: 21 U/L (ref 9–46)
AST: 17 U/L (ref 10–35)
Albumin: 4.5 g/dL (ref 3.6–5.1)
Alkaline phosphatase (APISO): 88 U/L (ref 35–144)
BUN: 23 mg/dL (ref 7–25)
CO2: 25 mmol/L (ref 20–32)
Calcium: 9.4 mg/dL (ref 8.6–10.3)
Chloride: 101 mmol/L (ref 98–110)
Creat: 0.94 mg/dL (ref 0.70–1.33)
GFR, Est African American: 105 mL/min/{1.73_m2} (ref >=60)
GFR, Est Non African American: 90 mL/min/{1.73_m2} (ref >=60)
Globulin: 3.1 g/dL (calc) (ref 1.9–3.7)
Glucose, Bld: 170 mg/dL — ABNORMAL HIGH (ref 65–99)
Potassium: 4.4 mmol/L (ref 3.5–5.3)
Sodium: 135 mmol/L (ref 135–146)
Total Bilirubin: 0.5 mg/dL (ref 0.2–1.2)
Total Protein: 7.6 g/dL (ref 6.1–8.1)

## 2019-12-05 LAB — HEMOGLOBIN A1C
Hgb A1c MFr Bld: 8.4 % of total Hgb — ABNORMAL HIGH (ref <5.7)
Mean Plasma Glucose: 194 (calc)
eAG (mmol/L): 10.8 (calc)

## 2019-12-05 LAB — LIPID PANEL
Cholesterol: 128 mg/dL (ref <200)
HDL: 47 mg/dL (ref >=40)
LDL Cholesterol (Calc): 61 mg/dL (calc)
Non-HDL Cholesterol (Calc): 81 mg/dL (calc) (ref <130)
Total CHOL/HDL Ratio: 2.7 (calc) (ref <5.0)
Triglycerides: 121 mg/dL (ref <150)

## 2019-12-05 LAB — TSH: TSH: 2.39 mIU/L (ref 0.40–4.50)

## 2019-12-05 LAB — MAGNESIUM: Magnesium: 1.9 mg/dL (ref 1.5–2.5)

## 2019-12-05 LAB — VITAMIN D 25 HYDROXY (VIT D DEFICIENCY, FRACTURES): Vit D, 25-Hydroxy: 60 ng/mL (ref 30–100)

## 2019-12-05 NOTE — Progress Notes (Signed)
========================================================== -   Test results slightly outside the reference range are not unusual. If there is anything important, I will review this with you,  otherwise it is considered normal test values.  If you have further questions,  please do not hesitate to contact me at the office or via My Chart.  ==========================================================  -  CBC - Much better - Both WBC & red cell counts back to Normal  ==========================================================  -  Total Chol = 128 and LDL 61 - Both  Excellent   - Very low risk for Heart Attack  / Stroke  $$$$$$$$$$$$$$$$$$$$$$$$$$$$$$$$$$$$$$$$$$$$$$$$$$$$$$$$$$$$$$$ ============================================================  - A1c a "little" better  - Down from 10.8% to now 8.4%, But................  - Still too high  (Ideal or Goal is less than 5.7%)   ============================================================ $$$$$$$$$$$$$$$$$$$$$$$$$$$$$$$$$$$$$$$$$$$$$$$$$$$$$$$$$$$$$$$  -  Vitamin D = 60 - Great  ============================================================  -  All Else - CBC - Kidneys - Electrolytes - Liver   & Magnesium   - all  Normal / OK =============================================================

## 2019-12-06 ENCOUNTER — Encounter (HOSPITAL_COMMUNITY): Payer: Managed Care, Other (non HMO)

## 2019-12-11 ENCOUNTER — Telehealth (HOSPITAL_COMMUNITY): Payer: Self-pay | Admitting: Internal Medicine

## 2019-12-11 ENCOUNTER — Encounter (HOSPITAL_COMMUNITY): Payer: Managed Care, Other (non HMO)

## 2019-12-12 ENCOUNTER — Encounter (HOSPITAL_COMMUNITY): Payer: Self-pay | Admitting: *Deleted

## 2019-12-12 DIAGNOSIS — Z951 Presence of aortocoronary bypass graft: Secondary | ICD-10-CM

## 2019-12-12 NOTE — Progress Notes (Addendum)
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  aspirin EC 325 MG EC tablet, Take 1 tablet (325 mg total) by mouth daily., Disp: 30 tablet, Rfl: 0 .  blood glucose meter kit and supplies KIT, Dispense based on patient and insurance preference. Use up to 3 times daily as directed. (FOR ICD-10-E11.9 and Z79.4). (Patient taking differently: Inject 1 each into the skin See admin instructions. Dispense based on patient and insurance preference. Use up to 3 times daily as directed. (FOR ICD-10-E11.9 and Z79.4).), Disp: 1 each, Rfl: 0 .  citalopram (CELEXA) 40 MG tablet, Take 1 tablet Daily for Mood, Disp: 90 tablet, Rfl: 0 .  Continuous Blood Gluc Receiver (FREESTYLE LIBRE 2 READER) DEVI, 1 each with 6 refills. Use as directed (Patient taking differently: 1 each by Other route as directed. ), Disp: 1 each, Rfl: 6 .  Continuous Blood Gluc Sensor (FREESTYLE LIBRE 2 SENSOR) MISC, Please supply one box and 6 refills. Use as directed (Patient taking differently: 1 each by Other route See admin instructions. Please supply one box and 6 refills. Use as directed), Disp: 1 each, Rfl: 6 .  empagliflozin (JARDIANCE) 25 MG TABS tablet, Take 25 mg by mouth daily. , Disp: , Rfl:  .  insulin degludec (TRESIBA FLEXTOUCH) 100 UNIT/ML FlexTouch Pen, Inject 30 Units into the skin daily., Disp: , Rfl:  .  levothyroxine (SYNTHROID) 137 MCG tablet, Take 1 tablet Daily on an empty stomach with only  water for 30 minutes & no Antacid meds, Calcium or Magnesium for 4 hours & avoid Biotin, Disp: 90 tablet, Rfl: 0 .  metFORMIN (GLUCOPHAGE-XR) 500 MG 24 hr tablet, Take 2 tablets 2 x  /day with Meals for Diabetes (Patient taking differently: Take 1,000 mg by mouth 2 (two) times daily with a meal. ), Disp: 360 tablet, Rfl: 0 .  omeprazole (PRILOSEC) 40 MG capsule, Take 1 capsule    Daily     to Prevent Heartburn & Indigestion, Disp: 90 capsule, Rfl: 0 .  rosuvastatin (CRESTOR) 40 MG tablet, Take   1 tablet     Daily   for Cholesterol, Disp: 90 tablet, Rfl: 0 .  VITAMIN D PO, Take 10,000 Units by mouth daily. , Disp: , Rfl:   Past Medical History: Past Medical History:  Diagnosis Date  . Anxiety   . ASTHMA 10/28/2006  . Coronary artery disease   . Cough   . Diabetes mellitus type II 2000   age 79  . GERD (gastroesophageal reflux disease)   . Hyperlipidemia   . Hypertension 1995  . Hypothyroidism 2002   thyroidectomy for Goiter  . Lumbar back pain   . NASH (nonalcoholic steatohepatitis)   . Numbness   . OSA (obstructive sleep apnea) 11/19/2010  . Sleep apnea    Pt states mild case, doesn't use cpap    Tobacco Use: Social History   Tobacco Use  Smoking Status Never Smoker  Smokeless Tobacco Never Used    Labs: Recent  Review Flowsheet Data    Labs for ITP Cardiac and Pulmonary Rehab Latest Ref Rng & Units 08/27/2019 08/27/2019 08/27/2019 08/27/2019 12/04/2019   Cholestrol <200 mg/dL - - - - 128   LDLCALC mg/dL (calc) - - - - 61   LDLDIRECT mg/dL - - - - -   HDL > OR = 40 mg/dL - - - - 47   Trlycerides <150 mg/dL - - - - 121   Hemoglobin A1c <5.7 % of total Hgb - - - - 8.4(H)   PHART 7.35 - 7.45 - 7.416 7.329(L) 7.338(L) -   PCO2ART 32 - 48 mmHg - 33.2 45.3 49.3(H) -   HCO3 20.0 - 28.0 mmol/L - 21.5 23.8 26.3 -   TCO2 22 - 32 mmol/L $RemoveB'25 23 25 28 'MQiHnrQp$ -   ACIDBASEDEF 0.0 - 2.0 mmol/L - 3.0(H) 2.0 - -   O2SAT % - 98.0 98.0 98.0 -      Capillary Blood Glucose: Lab Results  Component  Value Date   GLUCAP 247 (H) 11/18/2019   GLUCAP 253 (H) 11/18/2019   GLUCAP 155 (H) 09/06/2019   GLUCAP 230 (H) 09/01/2019   GLUCAP 189 (H) 09/01/2019     Exercise Target Goals: Exercise Program Goal: Individual exercise prescription set using results from initial 6 min walk test and THRR while considering  patient's activity barriers and safety.   Exercise Prescription Goal: Starting with aerobic activity 30 plus minutes a day, 3 days per week for initial exercise prescription. Provide home exercise prescription and guidelines that participant acknowledges understanding prior to discharge.  Activity Barriers & Risk Stratification:  Activity Barriers & Cardiac Risk Stratification - 11/14/19 1107      Activity Barriers & Cardiac Risk Stratification   Activity Barriers None    Cardiac Risk Stratification High           6 Minute Walk:  6 Minute Walk    Row Name 11/14/19 1105         6 Minute Walk   Phase Initial     Distance 1430 feet     Walk Time 6 minutes     # of Rest Breaks 0     MPH 2.71     METS 3.7     RPE 11     Perceived Dyspnea  0     VO2 Peak 12.9     Symptoms No     Resting HR 82 bpm     Resting BP 118/70     Resting Oxygen Saturation  96 %     Exercise Oxygen Saturation  during 6 min walk 97 %     Max Ex. HR 107 bpm     Max Ex. BP 120/70     2 Minute Post BP 116/68            Oxygen Initial Assessment:   Oxygen Re-Evaluation:   Oxygen Discharge (Final Oxygen Re-Evaluation):   Initial Exercise Prescription:  Initial Exercise Prescription - 11/14/19 1100      Date of Initial Exercise RX and Referring Provider   Date 11/14/19    Referring Provider Skeet Latch MD    Expected Discharge Date 01/10/20      Treadmill   MPH 2.3    Grade 1    Minutes 15    METs 3.08      NuStep   Level 2    SPM 75    Minutes 15    METs 2.5      Prescription  Details   Frequency (times per week) 3x    Duration Progress to 30 minutes of  continuous aerobic without signs/symptoms of physical distress      Intensity   THRR 40-80% of Max Heartrate 66-132    Ratings of Perceived Exertion 11-13    Perceived Dyspnea 0-4      Progression   Progression Continue progressive overload as per policy without signs/symptoms or physical distress.      Resistance Training   Training Prescription Yes    Weight 4lbs    Reps 10-15           Perform Capillary Blood Glucose checks as needed.  Exercise Prescription Changes:   Exercise Prescription Changes    Row Name 11/18/19 1300 12/04/19 1545           Response to Exercise   Blood Pressure (Admit) 126/74 118/80      Blood Pressure (Exercise) 142/70 146/84      Blood Pressure (Exit) 130/70 122/70      Heart Rate (Admit) 99 bpm 100 bpm      Heart Rate (Exercise) 121 bpm 116 bpm      Heart Rate (Exit) 103 bpm 96 bpm      Rating of Perceived Exertion (Exercise) 11 12      Perceived Dyspnea (Exercise) 0 0      Symptoms None None      Comments Pt's first day of exercise None      Duration Progress to 30 minutes of  aerobic without signs/symptoms of physical distress Progress to 30 minutes of  aerobic without signs/symptoms of physical distress      Intensity THRR unchanged THRR unchanged        Progression   Progression Continue to progress workloads to maintain intensity without signs/symptoms of physical distress. Continue to progress workloads to maintain intensity without signs/symptoms of physical distress.      Average METs 2.8 2.69        Resistance Training   Training Prescription Yes No      Weight 4lbs --      Reps 10-15 --      Time 10 Minutes --        Interval Training   Interval Training No No        Treadmill   MPH 2.3 2.3      Grade 1 1      Minutes 15 15      METs 3.08 3.08        NuStep   Level 2 4      SPM 75 85      Minutes 15 15      METs 2.5 2.3             Exercise Comments:   Exercise Comments    Row Name 11/18/19 1316 12/12/19  1441         Exercise Comments Pt's first day of exercise. Pt responded well to exercise prescription. Pt tolerated Nustep very well, expressed wanting to increase workload next session. Will increase to level 3 and see how pt tolerates it. Will continue to monitor. Pt has completed 3 sessions since starting rehab on 11/18/19. Will follow up with pt regarding plans for home exercise. Will continue to monitor.             Exercise Goals and Review:   Exercise Goals    Row Name 11/14/19 1106  Exercise Goals   Increase Physical Activity Yes       Intervention Provide advice, education, support and counseling about physical activity/exercise needs.;Develop an individualized exercise prescription for aerobic and resistive training based on initial evaluation findings, risk stratification, comorbidities and participant's personal goals.       Expected Outcomes Long Term: Add in home exercise to make exercise part of routine and to increase amount of physical activity.;Short Term: Attend rehab on a regular basis to increase amount of physical activity.;Long Term: Exercising regularly at least 3-5 days a week.       Increase Strength and Stamina Yes       Intervention Provide advice, education, support and counseling about physical activity/exercise needs.;Develop an individualized exercise prescription for aerobic and resistive training based on initial evaluation findings, risk stratification, comorbidities and participant's personal goals.       Expected Outcomes Short Term: Increase workloads from initial exercise prescription for resistance, speed, and METs.;Short Term: Perform resistance training exercises routinely during rehab and add in resistance training at home;Long Term: Improve cardiorespiratory fitness, muscular endurance and strength as measured by increased METs and functional capacity (6MWT)       Able to understand and use rate of perceived exertion (RPE) scale Yes         Intervention Provide education and explanation on how to use RPE scale       Expected Outcomes Short Term: Able to use RPE daily in rehab to express subjective intensity level;Long Term:  Able to use RPE to guide intensity level when exercising independently       Knowledge and understanding of Target Heart Rate Range (THRR) Yes       Intervention Provide education and explanation of THRR including how the numbers were predicted and where they are located for reference       Expected Outcomes Short Term: Able to state/look up THRR;Long Term: Able to use THRR to govern intensity when exercising independently;Short Term: Able to use daily as guideline for intensity in rehab       Able to check pulse independently Yes       Intervention Provide education and demonstration on how to check pulse in carotid and radial arteries.;Review the importance of being able to check your own pulse for safety during independent exercise       Expected Outcomes Short Term: Able to explain why pulse checking is important during independent exercise;Long Term: Able to check pulse independently and accurately       Understanding of Exercise Prescription Yes       Intervention Provide education, explanation, and written materials on patient's individual exercise prescription       Expected Outcomes Short Term: Able to explain program exercise prescription;Long Term: Able to explain home exercise prescription to exercise independently              Exercise Goals Re-Evaluation :  Exercise Goals Re-Evaluation    [place] Name 11/18/19 1317             Exercise Goal Re-Evaluation   Exercise Goals Review Increase Physical Activity;Able to understand and use rate of perceived exertion (RPE) scale;Understanding of Exercise Prescription       Comments Pt's first day of exercise. Pt oriented to exercise equipment. Pt able to exercise for 30 minutes with mimal difficulty.       Expected Outcomes Pt will work ot increase  strength and stamina.  Discharge Exercise Prescription (Final Exercise Prescription Changes):  Exercise Prescription Changes - 12/04/19 1545      Response to Exercise   Blood Pressure (Admit) 118/80    Blood Pressure (Exercise) 146/84    Blood Pressure (Exit) 122/70    Heart Rate (Admit) 100 bpm    Heart Rate (Exercise) 116 bpm    Heart Rate (Exit) 96 bpm    Rating of Perceived Exertion (Exercise) 12    Perceived Dyspnea (Exercise) 0    Symptoms None    Comments None    Duration Progress to 30 minutes of  aerobic without signs/symptoms of physical distress    Intensity THRR unchanged      Progression   Progression Continue to progress workloads to maintain intensity without signs/symptoms of physical distress.    Average METs 2.69      Resistance Training   Training Prescription No      Interval Training   Interval Training No      Treadmill   MPH 2.3    Grade 1    Minutes 15    METs 3.08      NuStep   Level 4    SPM 85    Minutes 15    METs 2.3           Nutrition:  Target Goals: Understanding of nutrition guidelines, daily intake of sodium '1500mg'$ , cholesterol '200mg'$ , calories 30% from fat and 7% or less from saturated fats, daily to have 5 or more servings of fruits and vegetables.  Biometrics:  Pre Biometrics - 11/14/19 1106      Pre Biometrics   Height $Remov'5\' 10"'CPjFCN$  (1.778 m)    Weight 216 lb 7.9 oz (98.2 kg)    Waist Circumference 43 inches    Hip Circumference 42 inches    Waist to Hip Ratio 1.02 %    BMI (Calculated) 31.06    Triceps Skinfold 12 mm    % Body Fat 28.7 %    Grip Strength 39 kg    Flexibility 17 in    Single Leg Stand 18.87 seconds            Nutrition Therapy Plan and Nutrition Goals:   Nutrition Assessments:  Nutrition Assessments - 12/05/19 1108      MEDFICTS Scores   Pre Score 33           Nutrition Goals Re-Evaluation:   Nutrition Goals Discharge (Final Nutrition Goals  Re-Evaluation):   Psychosocial: Target Goals: Acknowledge presence or absence of significant depression and/or stress, maximize coping skills, provide positive support system. Participant is able to verbalize types and ability to use techniques and skills needed for reducing stress and depression.  Initial Review & Psychosocial Screening:  Initial Psych Review & Screening - 11/14/19 1151      Initial Review   Current issues with None Identified      Family Dynamics   Good Support System? Yes   Ronalee Belts has his wife for support     Barriers   Psychosocial barriers to participate in program There are no identifiable barriers or psychosocial needs.      Screening Interventions   Interventions Encouraged to exercise           Quality of Life Scores:  Quality of Life - 11/14/19 1118      Quality of Life   Select Quality of Life      Quality of Life Scores   Health/Function Pre 26.6 %    Socioeconomic Pre  29.06 %    Psych/Spiritual Pre 30 %    Family Pre 30 %    GLOBAL Pre 29.61 %          Scores of 19 and below usually indicate a poorer quality of life in these areas.  A difference of  2-3 points is a clinically meaningful difference.  A difference of 2-3 points in the total score of the Quality of Life Index has been associated with significant improvement in overall quality of life, self-image, physical symptoms, and general health in studies assessing change in quality of life.  PHQ-9: Recent Review Flowsheet Data    Depression screen [facility] 2/9 12/03/2019 11/14/2019 05/30/2019 05/09/2018 05/06/2018   Decreased Interest 0 0 0 0 0   Down, Depressed, Hopeless 0 0 0 0 0   PHQ - 2 Score 0 0 0 0 0     Interpretation of Total Score  Total Score Depression Severity:  1-4 = Minimal depression, 5-9 = Mild depression, 10-14 = Moderate depression, 15-19 = Moderately severe depression, 20-27 = Severe depression   Psychosocial Evaluation and Intervention:   Psychosocial Re-Evaluation:   Psychosocial Re-Evaluation    [place] Name 12/12/19 1713             Psychosocial Re-Evaluation   Current issues with None Identified       Interventions Encouraged to attend Cardiac Rehabilitation for the exercise       Continue Psychosocial Services  No Follow up required              Psychosocial Discharge (Final Psychosocial Re-Evaluation):  Psychosocial Re-Evaluation - 12/12/19 1713      Psychosocial Re-Evaluation   Current issues with None Identified    Interventions Encouraged to attend Cardiac Rehabilitation for the exercise    Continue Psychosocial Services  No Follow up required           Vocational Rehabilitation: Provide vocational rehab assistance to qualifying candidates.   Vocational Rehab Evaluation & Intervention:  Vocational Rehab - 11/14/19 1152      Initial Vocational Rehab Evaluation & Intervention   Assessment shows need for Vocational Rehabilitation No   Ronalee Belts works part time and does not need vocational rehab at this time          Education: Education Goals: Education classes will be provided on a weekly basis, covering required topics. Participant will state understanding/return demonstration of topics presented.  Learning Barriers/Preferences:  Learning Barriers/Preferences - 11/14/19 1118      Learning Barriers/Preferences   Learning Barriers Sight    Learning Preferences Written Material;Individual Instruction           Education Topics: Hypertension, Hypertension Reduction -Define heart disease and high blood pressure. Discus how high blood pressure affects the body and ways to reduce high blood pressure.   Exercise and Your Heart -Discuss why it is important to exercise, the FITT principles of exercise, normal and abnormal responses to exercise, and how to exercise safely.   Angina -Discuss definition of angina, causes of angina, treatment of angina, and how to decrease risk of having angina.   Cardiac Medications -Review  what the following cardiac medications are used for, how they affect the body, and side effects that may occur when taking the medications.  Medications include Aspirin, Beta blockers, calcium channel blockers, ACE Inhibitors, angiotensin receptor blockers, diuretics, digoxin, and antihyperlipidemics.   Congestive Heart Failure -Discuss the definition of CHF, how to live with CHF, the signs and symptoms of CHF, and how  keep track of weight and sodium intake.   Heart Disease and Intimacy -Discus the effect sexual activity has on the heart, how changes occur during intimacy as we age, and safety during sexual activity.   Smoking Cessation / COPD -Discuss different methods to quit smoking, the health benefits of quitting smoking, and the definition of COPD.   Nutrition I: Fats -Discuss the types of cholesterol, what cholesterol does to the heart, and how cholesterol levels can be controlled.   Nutrition II: Labels -Discuss the different components of food labels and how to read food label   Heart Parts/Heart Disease and PAD -Discuss the anatomy of the heart, the pathway of blood circulation through the heart, and these are affected by heart disease.   Stress I: Signs and Symptoms -Discuss the causes of stress, how stress may lead to anxiety and depression, and ways to limit stress.   Stress II: Relaxation -Discuss different types of relaxation techniques to limit stress.   Warning Signs of Stroke / TIA -Discuss definition of a stroke, what the signs and symptoms are of a stroke, and how to identify when someone is having stroke.   Knowledge Questionnaire Score:  Knowledge Questionnaire Score - 11/14/19 1124      Knowledge Questionnaire Score   Pre Score 23/24           Core Components/Risk Factors/Patient Goals at Admission:  Personal Goals and Risk Factors at Admission - 11/14/19 1119      Core Components/Risk Factors/Patient Goals on Admission    Weight Management  Yes;Weight Loss    Intervention Weight Management: Develop a combined nutrition and exercise program designed to reach desired caloric intake, while maintaining appropriate intake of nutrient and fiber, sodium and fats, and appropriate energy expenditure required for the weight goal.;Weight Management: Provide education and appropriate resources to help participant work on and attain dietary goals.;Weight Management/Obesity: Establish reasonable short term and long term weight goals.;Obesity: Provide education and appropriate resources to help participant work on and attain dietary goals.    Admit Weight 216 lb 7.9 oz (98.2 kg)    Goal Weight: Long Term 200 lb (90.7 kg)    Expected Outcomes Short Term: Continue to assess and modify interventions until short term weight is achieved;Long Term: Adherence to nutrition and physical activity/exercise program aimed toward attainment of established weight goal;Weight Loss: Understanding of general recommendations for a balanced deficit meal plan, which promotes 1-2 lb weight loss per week and includes a negative energy balance of [phone] kcal/d;Understanding recommendations for meals to include 15-35% energy as protein, 25-35% energy from fat, 35-60% energy from carbohydrates, less than $RemoveB'200mg'qJuYZNrU$  of dietary cholesterol, 20-35 gm of total fiber daily;Understanding of distribution of calorie intake throughout the day with the consumption of 4-5 meals/snacks    Diabetes Yes    Intervention Provide education about proper nutrition, including hydration, and aerobic/resistive exercise prescription along with prescribed medications to achieve blood glucose in normal ranges: Fasting glucose 65-99 mg/dL;Provide education about signs/symptoms and action to take for hypo/hyperglycemia.    Expected Outcomes Short Term: Participant verbalizes understanding of the signs/symptoms and immediate care of hyper/hypoglycemia, proper foot care and importance of medication, aerobic/resistive  exercise and nutrition plan for blood glucose control.;Long Term: Attainment of HbA1C < 7%.    Lipids Yes    Intervention Provide education and support for participant on nutrition & aerobic/resistive exercise along with prescribed medications to achieve LDL '70mg'$ , HDL >$Remo'40mg'jQIxt$ .    Expected Outcomes Short Term: Participant states understanding of desired  cholesterol values and is compliant with medications prescribed. Participant is following exercise prescription and nutrition guidelines.;Long Term: Cholesterol controlled with medications as prescribed, with individualized exercise RX and with personalized nutrition plan. Value goals: LDL < $Rem'70mg'IRoi$ , HDL > 40 mg.           Core Components/Risk Factors/Patient Goals Review:   Goals and Risk Factor Review    Row Name 12/12/19 1713             Core Components/Risk Factors/Patient Goals Review   Personal Goals Review Weight Management/Obesity;Lipids;Diabetes       Review Mike's vtial sign and CBG's have been stable when in attendance. Ronalee Belts is doing good with exercise.       Expected Outcomes Patient will continue to partcipate in phase 2 cardiac rehab for exercise, nutrition and lifestyle modifications.              Core Components/Risk Factors/Patient Goals at Discharge (Final Review):   Goals and Risk Factor Review - 12/12/19 1713      Core Components/Risk Factors/Patient Goals Review   Personal Goals Review Weight Management/Obesity;Lipids;Diabetes    Review Mike's vtial sign and CBG's have been stable when in attendance. Ronalee Belts is doing good with exercise.    Expected Outcomes Patient will continue to partcipate in phase 2 cardiac rehab for exercise, nutrition and lifestyle modifications.           ITP Comments:  ITP Comments    Row Name 11/14/19 1102 12/12/19 1711         ITP Comments Dr. Fransico Him, Medical Director 30 Day ITP REview. Mikes attendance haqs been fair. Ronalee Belts has good participation when in attendance.               Comments: See ITP comments. Ronalee Belts says he has returned to work and will return to exercise on Monday. Ronalee Belts was absent last week due to vacation.Barnet Pall, RN,BSN 12/12/2019 5:17 PM

## 2019-12-13 ENCOUNTER — Encounter (HOSPITAL_COMMUNITY): Payer: Managed Care, Other (non HMO)

## 2019-12-16 ENCOUNTER — Encounter (HOSPITAL_COMMUNITY): Payer: Managed Care, Other (non HMO)

## 2019-12-18 ENCOUNTER — Encounter (HOSPITAL_COMMUNITY): Payer: Managed Care, Other (non HMO)

## 2019-12-18 ENCOUNTER — Telehealth (HOSPITAL_COMMUNITY): Payer: Self-pay | Admitting: *Deleted

## 2019-12-18 NOTE — Telephone Encounter (Addendum)
Spoke with the patient he has returned to work and will not be able to participate in cardiac rehab. Will discharge. Ronalee Belts attended 3 exercise sessions between 11/18/19-12/04/19.Barnet Pall, RN,BSN 12/18/2019 11:11 AM

## 2019-12-20 ENCOUNTER — Encounter (HOSPITAL_COMMUNITY): Payer: Managed Care, Other (non HMO)

## 2019-12-23 ENCOUNTER — Encounter (HOSPITAL_COMMUNITY): Payer: Managed Care, Other (non HMO)

## 2019-12-24 ENCOUNTER — Other Ambulatory Visit: Payer: Self-pay | Admitting: Internal Medicine

## 2019-12-25 ENCOUNTER — Ambulatory Visit (HOSPITAL_COMMUNITY): Payer: Managed Care, Other (non HMO) | Attending: Cardiology

## 2019-12-25 ENCOUNTER — Other Ambulatory Visit: Payer: Self-pay

## 2019-12-25 DIAGNOSIS — I5041 Acute combined systolic (congestive) and diastolic (congestive) heart failure: Secondary | ICD-10-CM | POA: Diagnosis not present

## 2019-12-25 DIAGNOSIS — I214 Non-ST elevation (NSTEMI) myocardial infarction: Secondary | ICD-10-CM | POA: Diagnosis not present

## 2019-12-25 LAB — ECHOCARDIOGRAM COMPLETE
Area-P 1/2: 8.16 cm2
S' Lateral: 2.3 cm

## 2019-12-27 ENCOUNTER — Encounter (HOSPITAL_COMMUNITY): Payer: Managed Care, Other (non HMO)

## 2019-12-30 ENCOUNTER — Encounter (HOSPITAL_COMMUNITY): Payer: Managed Care, Other (non HMO)

## 2020-01-01 ENCOUNTER — Encounter (HOSPITAL_COMMUNITY): Payer: Managed Care, Other (non HMO)

## 2020-01-02 ENCOUNTER — Ambulatory Visit: Payer: Managed Care, Other (non HMO) | Admitting: Cardiovascular Disease

## 2020-01-03 ENCOUNTER — Encounter (HOSPITAL_COMMUNITY): Payer: Managed Care, Other (non HMO)

## 2020-01-06 ENCOUNTER — Encounter (HOSPITAL_COMMUNITY): Payer: Managed Care, Other (non HMO)

## 2020-01-08 ENCOUNTER — Encounter (HOSPITAL_COMMUNITY): Payer: Managed Care, Other (non HMO)

## 2020-01-10 ENCOUNTER — Encounter (HOSPITAL_COMMUNITY): Payer: Managed Care, Other (non HMO)

## 2020-02-04 ENCOUNTER — Encounter: Payer: Managed Care, Other (non HMO) | Admitting: Internal Medicine

## 2020-02-10 ENCOUNTER — Other Ambulatory Visit: Payer: Self-pay

## 2020-02-10 ENCOUNTER — Encounter: Payer: Self-pay | Admitting: Cardiovascular Disease

## 2020-02-10 ENCOUNTER — Ambulatory Visit (INDEPENDENT_AMBULATORY_CARE_PROVIDER_SITE_OTHER): Payer: Managed Care, Other (non HMO) | Admitting: Cardiovascular Disease

## 2020-02-10 VITALS — BP 160/104 | HR 88 | Ht 70.0 in | Wt 216.0 lb

## 2020-02-10 DIAGNOSIS — E1169 Type 2 diabetes mellitus with other specified complication: Secondary | ICD-10-CM

## 2020-02-10 DIAGNOSIS — E785 Hyperlipidemia, unspecified: Secondary | ICD-10-CM

## 2020-02-10 DIAGNOSIS — I25110 Atherosclerotic heart disease of native coronary artery with unstable angina pectoris: Secondary | ICD-10-CM

## 2020-02-10 DIAGNOSIS — I77811 Abdominal aortic ectasia: Secondary | ICD-10-CM | POA: Diagnosis not present

## 2020-02-10 DIAGNOSIS — I10 Essential (primary) hypertension: Secondary | ICD-10-CM

## 2020-02-10 DIAGNOSIS — Z951 Presence of aortocoronary bypass graft: Secondary | ICD-10-CM

## 2020-02-10 NOTE — Patient Instructions (Signed)
Medication Instructions:  DECREASE ASPIRIN TO 81 MG DAILY   *If you need a refill on your cardiac medications before your next appointment, please call your pharmacy*  Lab Work: NONE  Testing/Procedures: NONE  Follow-Up: At [facility], you and your health needs are our priority.  As part of our continuing mission to provide you with exceptional heart care, we have created designated Provider Care Teams.  These Care Teams include your primary Cardiologist (physician) and Advanced Practice Providers (APPs -  Physician Assistants and Nurse Practitioners) who all work together to provide you with the care you need, when you need it.  We recommend signing up for the patient portal called "MyChart".  Sign up information is provided on this After Visit Summary.  MyChart is used to connect with patients for Virtual Visits (Telemedicine).  Patients are able to view lab/test results, encounter notes, upcoming appointments, etc.  Non-urgent messages can be sent to your provider as well.   To learn more about what you can do with MyChart, go to [email or web address].    Your next appointment:   1 month(s)  The format for your next appointment:   Virtual Visit   Provider:   You will see one of the following Advanced Practice Providers on your designated Care Team:    Kerin Ransom, PA-C  [place], [place]  Coletta Memos, FNP  Arnold Long DNP  Then, Skeet Latch, MD will plan to see you again in 6 month(s).  Other Instructions  MONITOR AND LOG YOUR BLOOD PRESSURE DAILY IF IT IS DOING GOOD OK TO CANCEL YOUR VIRTUAL VISIT

## 2020-02-10 NOTE — Progress Notes (Signed)
Cardiology Office Note   Date:  02/10/2020   ID:  [name] [name], [name] [name] 1965/12/27, MRN [id number]  PCP:  Unk Pinto, MD  Cardiologist:   Skeet Latch, MD   No chief complaint on file.    History of Present Illness: [name] [name] is a 54 y.o. male with chronic systolic diastolic heart failure, CAD status post CABG 08/2019, mild ascending aorta aneurysm, diabetes, CKD, hypothyroidism, GERD, anxiety, OSA, and obesity who presents for follow-up.  He was initially seen in the hospital 08/2019 with chest pain in the setting of NSTEMI.  He presented to [place] and was transferred to [facility].  High-sensitivity troponin was over 13,000.  He underwent left heart catheterization 08/26/2019 that revealed diffuse three-vessel disease, including 90% left main disease.  He subsequently underwent CABG (LIMA--> LAD, SVG-->D1/D2, SVG-->PDA, PL1, PL2) with Dr. Cyndia Bent.  Echo that admission revealed LVEF 40 to 45% with grade 2 diastolic dysfunction.  LVEF improved to 60-65% on echo 9/021.  He was seen in the hospital 09/2019 with chest pain and dizziness.  He was hypotensive to the 94W systolic.  He last followed up with [name] Sims, DNP, on 09/2019 and was doing well.  He elected not to participate in cardiac rehab.  Overall he has been doing very well.  He is back working at the [employer].  He has been exercising by walking 2-3 times per week and has no exertional chest pain or shortness of breath.  He is unable to do more due to his working schedule.  He has no lower extremity edema, orthopnea, or PND.  He notes that since losing weight he no longer has sciatic pain.  He has not been checking his blood pressure much lately because in general has been well-controlled.  He is unsure why it is elevated today, though he did drink 2 cups of coffee right before coming to the office.   Past Medical History:  Diagnosis Date  . Anxiety   . ASTHMA 10/28/2006  . Coronary  artery disease   . Cough   . Diabetes mellitus type II 2000   age 60  . GERD (gastroesophageal reflux disease)   . Hyperlipidemia   . Hypertension 1995  . Hypothyroidism 2002   thyroidectomy for Goiter  . Lumbar back pain   . NASH (nonalcoholic steatohepatitis)   . Numbness   . OSA (obstructive sleep apnea) 11/19/2010  . Sleep apnea    Pt states mild case, doesn't use cpap    Past Surgical History:  Procedure Laterality Date  . bilateral shoulder surgery    . CARDIAC CATHETERIZATION    . CORONARY ARTERY BYPASS GRAFT N/A 08/27/2019   Procedure: CORONARY ARTERY BYPASS GRAFTING (CABG) x7, LIMA TO LAD, SVG TO DIAG 1 WITH SEQUENTIAL SVG TO DIAG 2, SVG TO OM2, SVG TO PDA WITH SEQUENTIAL SVG TO PLB WITH SEQUENTIAL SVG TO OTHER.;  Surgeon: Gaye Pollack, MD;  Location: [facility] OR;  Service: Open Heart Surgery;  Laterality: N/A;  . ENDOVEIN HARVEST OF GREATER SAPHENOUS VEIN Right 08/27/2019   Procedure: [place] Of Greater Saphenous Vein;  Surgeon: Gaye Pollack, MD;  Location: [facility] OR;  Service: Open Heart Surgery;  Laterality: Right;  . LEFT HEART CATH AND CORONARY ANGIOGRAPHY N/A 08/26/2019   Procedure: LEFT HEART CATH AND CORONARY ANGIOGRAPHY;  Surgeon: Nelva Bush, MD;  Location: [facility];  Service: Cardiovascular;  Laterality: N/A;  . right knee repair  1997  .  TEE WITHOUT CARDIOVERSION N/A 08/27/2019   Procedure: TRANSESOPHAGEAL ECHOCARDIOGRAM (TEE);  Surgeon: Gaye Pollack, MD;  Location: [facility];  Service: Open Heart Surgery;  Laterality: N/A;  . THYROIDECTOMY     right  . TRIGGER FINGER RELEASE    . TRIGGER FINGER RELEASE  02/23/2011   Procedure: RELEASE TRIGGER FINGER/A-1 PULLEY;  Surgeon: Meredith Pel;  Location: [facility];  Service: Orthopedics;  Laterality: Right;  Right 4th trigger finger release     Current Outpatient Medications  Medication Sig Dispense Refill  . aspirin EC 81 MG tablet Take 81 mg by mouth daily. Swallow whole.    . blood glucose  meter kit and supplies KIT Dispense based on patient and insurance preference. Use up to 3 times daily as directed. (FOR ICD-10-E11.9 and Z79.4). (Patient taking differently: Inject 1 each into the skin See admin instructions. Dispense based on patient and insurance preference. Use up to 3 times daily as directed. (FOR ICD-10-E11.9 and Z79.4).) 1 each 0  . citalopram (CELEXA) 40 MG tablet TAKE 1 TABLET ONCE DAILY. 90 tablet 0  . Continuous Blood Gluc Receiver (FREESTYLE LIBRE 2 READER) DEVI 1 each with 6 refills. Use as directed (Patient taking differently: 1 each by Other route as directed. ) 1 each 6  . Continuous Blood Gluc Sensor (FREESTYLE LIBRE 2 SENSOR) MISC Please supply one box and 6 refills. Use as directed (Patient taking differently: 1 each by Other route See admin instructions. Please supply one box and 6 refills. Use as directed) 1 each 6  . empagliflozin (JARDIANCE) 25 MG TABS tablet Take 25 mg by mouth daily.     Marland Kitchen levothyroxine (SYNTHROID) 137 MCG tablet Take 1 tablet Daily on an empty stomach with only water for 30 minutes & no Antacid meds, Calcium or Magnesium for 4 hours & avoid Biotin 90 tablet 0  . metFORMIN (GLUCOPHAGE-XR) 500 MG 24 hr tablet Take 2 tablets 2 x  /day with Meals for Diabetes (Patient taking differently: Take 1,000 mg by mouth 2 (two) times daily with a meal. ) 360 tablet 0  . omeprazole (PRILOSEC) 40 MG capsule Take 1 capsule    Daily     to Prevent Heartburn & Indigestion 90 capsule 0  . rosuvastatin (CRESTOR) 40 MG tablet Take   1 tablet     Daily   for Cholesterol 90 tablet 0  . Semaglutide,0.25 or 0.5MG /DOS, (OZEMPIC, 0.25 OR 0.5 MG/DOSE,) 2 MG/1.5ML SOPN Inject into the skin.    Marland Kitchen VITAMIN D PO Take 10,000 Units by mouth daily.      No current facility-administered medications for this visit.    Allergies:   Patient has no known allergies.    Social History:  The patient  reports that he has never smoked. He has never used smokeless tobacco. He reports  current alcohol use. He reports current drug use. Drug: Marijuana.   Family History:  The patient's family history includes Breast cancer in his mother; Heart attack in his father; Osteoarthritis in his father; Skin cancer in his father.    ROS:  Please see the history of present illness.   Otherwise, review of systems are positive for none.   All other systems are reviewed and negative.    PHYSICAL EXAM: VS:  BP (!) 160/104   Pulse 88   Ht 5\' 10"  (1.778 m)   Wt 216 lb (98 kg)   SpO2 97%   BMI 30.99 kg/m  , BMI Body mass index is 30.99 kg/m.  GENERAL:  Well appearing HEENT:  Pupils equal round and reactive, fundi not visualized, oral mucosa unremarkable NECK:  No jugular venous distention, waveform within normal limits, carotid upstroke brisk and symmetric, no bruits LUNGS:  Clear to auscultation bilaterally HEART:  RRR.  PMI not displaced or sustained,S1 and S2 within normal limits, no S3, no S4, no clicks, no rubs, no murmurs ABD:  Flat, positive bowel sounds normal in frequency in pitch, no bruits, no rebound, no guarding, no midline pulsatile mass, no hepatomegaly, no splenomegaly EXT:  2 plus pulses throughout, no edema, no cyanosis no clubbing SKIN:  No rashes no nodules NEURO:  Cranial nerves II through XII grossly intact, motor grossly intact throughout PSYCH:  Cognitively intact, oriented to person place and time   EKG:  EKG is ordered today. The ekg ordered today demonstrates sinus rhythm.  Rate 88 bpm.   Echo 08/26/19: 1. Left ventricular ejection fraction, by estimation, is 40 to 45%. The  left ventricle has mildly decreased function. The left ventricle  demonstrates regional wall motion abnormalities (see scoring  diagram/findings for description). Left ventricular  diastolic parameters are consistent with Grade II diastolic dysfunction  (pseudonormalization). There is moderate hypokinesis of the left  ventricular, mid-apical anteroseptal wall.  2. Right  ventricular systolic function is normal. The right ventricular  size is normal.  3. The mitral valve is normal in structure. Mild mitral valve  regurgitation. No evidence of mitral stenosis.  4. The aortic valve is normal in structure. Aortic valve regurgitation is  not visualized. No aortic stenosis is present.   Echo 12/2019:  1. Left ventricular ejection fraction, by estimation, is 55 to 60%. The  left ventricle has normal function. The left ventricle has no regional  wall motion abnormalities. Left ventricular diastolic parameters are  consistent with Grade I diastolic  dysfunction (impaired relaxation).  2. Right ventricular systolic function is mildly reduced. The right  ventricular size is normal. Tricuspid regurgitation signal is inadequate  for assessing PA pressure.  3. The mitral valve is normal in structure. Trivial mitral valve  regurgitation. No evidence of mitral stenosis.  4. The aortic valve is tricuspid. Aortic valve regurgitation is not  visualized. No aortic stenosis is present.  5. Aortic dilatation noted. There is mild dilatation of the aortic root,  measuring 40 mm.  6. The inferior vena cava is normal in size with greater than 50%  respiratory variability, suggesting right atrial pressure of 3 mmHg.   LHC 08/26/19: Diagnostic Dominance: Right   Conclusions: 1. Severe multivessel coronary artery disease, including 90% distal LMCA stenosis, as well as 70-80% lesions involving the mid LAD, D1, large OM1, and rPL branches. 2. Mildly to moderately reduced left ventricular contraction (LVEF ~45%) with anterior hypokinesis. Upper normal left ventricular filling pressure.  Carotid Doppler 08/26/19: Right Carotid: Velocities in the right ICA are consistent with a 40-59%         stenosis.   Left Carotid: Velocities in the left ICA are consistent with a 1-39%  stenosis.  Vertebrals: Bilateral vertebral arteries demonstrate antegrade flow.   Subclavians: Normal flow hemodynamics were seen in bilateral subclavian        arteries.  ABIs: Right ABI: Resting right ankle-brachial index is within normal range. No  evidence of significant right lower extremity arterial disease.  Left ABI: Resting left ankle-brachial index is within normal range. No  evidence of significant left lower extremity arterial disease.  Left Upper Extremity: Doppler waveforms decrease >50% with left radial  compression. Doppler waveform obliterate with left ulnar compression.     Recent Labs: 12/04/2019: ALT 21; BUN 23; Creat 0.94; Hemoglobin 15.3; Magnesium 1.9; Platelets 223; Potassium 4.4; Sodium 135; TSH 2.39    Lipid Panel    Component Value Date/Time   CHOL 128 12/04/2019 0936   TRIG 121 12/04/2019 0936   HDL 47 12/04/2019 0936   CHOLHDL 2.7 12/04/2019 0936   VLDL 23 08/26/2019 0749   LDLCALC 61 12/04/2019 0936   LDLDIRECT 120.3 11/07/2006 1321      Wt Readings from Last 3 Encounters:  02/10/20 216 lb (98 kg)  12/04/19 221 lb 6.4 oz (100.4 kg)  11/14/19 216 lb 7.9 oz (98.2 kg)      ASSESSMENT AND PLAN:  # CAD s/p CABG:  # Hyperlipidemia: [name] [name] is doing well.  He has no exertional symptoms.  He is going to work on try to increase the exercise to 150 minutes weekly.  His blood pressure has been low and so he has not been on antihypertensives.  Today however his blood pressure is elevated.  He is going to track it at home.  He sees his PCP next week.  Would add beta-blocker if blood pressure and heart rate allow.  He will reduce aspirin to $RemoveBe'81mg'prQUfcURS$  daily.  Lipids are well-controlled on rosuvastatin.  # Elevated BP: BP is elevated today but generally has been well-controlled.    # Chronic systolic and diastolic heart failure:   LVEF 40 to 45% prior to revascularization.  Repeat echo 12/2019 revealed improvement to 55 to 60%.  He is euvolemic and doing well.  Add beta blocker if able as above.  Continue Jardiance.  # Ascending  aorta aneurysm:  4.0 cm on echo 12/2019.  Add beta blocker as above.  Repeat echo 12/2020.  Current medicines are reviewed at length with the patient today.  The patient does not have concerns regarding medicines.  The following changes have been made:  no change  Labs/ tests ordered today include:   Orders Placed This Encounter  Procedures  . EKG 12-Lead     Disposition:   FU with APP virtually in 1 month.  F/u with [name] Gillin C. Oval Linsey, MD, [facility] in 6 months.      Signed, [name] Samson C. Oval Linsey, MD, [facility]  02/10/2020 11:58 AM    [place]

## 2020-02-24 ENCOUNTER — Other Ambulatory Visit: Payer: Self-pay | Admitting: Internal Medicine

## 2020-03-05 ENCOUNTER — Ambulatory Visit: Payer: Managed Care, Other (non HMO) | Admitting: Adult Health Nurse Practitioner

## 2020-03-05 NOTE — Progress Notes (Deleted)
Assessment and Plan:   Hypertension  Increase to full losartan/hctz 100/25 mg daily Monitor blood pressure at home; call if consistently over 130/80, discussed ideally <120/80 Continue DASH diet.   Reminder to go to the ER if any CP, SOB, nausea, dizziness, severe HA, changes vision/speech, left arm numbness and tingling and jaw pain.  Cholesterol Stop atorvastatin, start rosuvastatin 40 mg  Reviewed LDL goal <70 Continue low cholesterol diet and exercise.  Check lipid panel.   Diabetes with diabetic chronic kidney disease Medications: metformin - add 3rd tab with dinner; increase to 2 tabs in PM if tolerating after 2 weeks for total of 4 tabs daily; discussed imodium PRN if initial diarrhea Sent in novolin 70/30 pens which [name] [name] feels will improve compliance; continue 40 units AM, start 20 units PM and gradually increase until fasting glucose is <150, then if tolerating and no hypoglycemia until <130 Discussed low glucose overnight is more dangerous than high; call with any concerns or hypoglycemia Discussed disease and risks Discussed diet/exercise, weight management  A1C  Hypothyroidism continue medications the same pending lab results reminded to take on an empty stomach 30-67mins before food.  check TSH level  Vitamin D Def Taking 4000 IU daily/more consistently Continue supplementation for goal of 60-100 Check vitamin D level  GERD Symptoms well managed without breakthrough Will try to get off PPI given info for taper and famotidine sent in Discussed diet, avoiding triggers and other lifestyle changes  Depression/anxiety Continue medications  Lifestyle discussed: diet/exerise, sleep hygiene, stress management, hydration   Continue diet and meds as discussed. Further disposition pending results of labs. Discussed med's effects and SE's.    Future Appointments  Date Time Provider [place]  03/05/2020  9:30 AM Garnet Sierras, NP [facility] None  03/23/2020   8:40 AM Skeet Latch, MD [facility]  06/04/2020 10:00 AM Unk Pinto, MD [facility] None     HPI 54 y.o. male  presents for 3 month follow up with HTN, HLD, DMII, GERD, depression, hypothyroidism, CKD, GERD and OSA and vitamin D deficiency.  Reports overall [name] [name] is doing well.  [name] [name] works at [employer].  [name] [name] exercises 2-3 times a week. [name] [name] is right handed, had injury right hand, saw Dr. Fredna Dow that wanted an MRI but the patient states it was too expensive.   [name] [name] admits to drinking 6-8 beers a day.   [name] [name] has a diagnosis of depression and is currently on celexa 40 mg daily, reports symptoms are well controlled on current regimen.   [name] [name] has a diagnosis of GERD which is currently managed by omeprazole 20 mg daily  [name] [name] reports symptoms is currently well controlled, and denies breakthrough reflux, burning in chest, hoarseness or cough.    BMI is There is no height or weight on file to calculate BMI., [name] [name] has not been working on diet and exercise, is generally active in his yardwork and going to [place] most weekends.  [name] [name] avoids sweet drinks Wt Readings from Last 3 Encounters:  02/10/20 216 lb (98 kg)  12/04/19 221 lb 6.4 oz (100.4 kg)  11/14/19 216 lb 7.9 oz (98.2 kg)   [name] [name] does not currently check BP at home, today their BP is    [name] [name] does not workout. [name] [name] denies chest pain, shortness of breath, dizziness. [name] [name] has Chronic diastolic systolic heart failure, CAD s/p CABG 08/2019, mild ascending aorta aneurysm.  [name] [name] underwent left heart catheterization 08/26/2019 that revealed diffuse three-vessel disease, including 90% left main disease.  [name] [name] subsequently underwent  CABG (LIMA--> LAD, SVG-->D1/D2, SVG-->PDA, PL1, PL2) with Dr. Cyndia Bent.  Echo that admission revealed LVEF 40 to 45% with grade 2 diastolic dysfunction.  LVEF improved to 60-65% on echo 9/021.  [name] [name] was seen in the hospital 09/2019 with chest pain and dizziness.  [name] [name] was hypotensive to the 95M systolic.  [name] [name] last followed up with Jory Sims, DNP, on 09/2019 and was doing well.  [name] [name] elected not to participate in cardiac rehab.  Last OV with Cardio[facility] was 02/10/20. [name] [name] is on cholesterol medication (atorvastatin 80 mg daily) and denies myalgias. His cholesterol is not at goal. The cholesterol last visit was:   Lab Results  Component Value Date   CHOL 128 12/04/2019   HDL 47 12/04/2019   LDLCALC 61 12/04/2019   LDLDIRECT 120.3 11/07/2006   TRIG 121 12/04/2019   CHOLHDL 2.7 12/04/2019    [name] [name] has not been working on diet and exercise for T2DM (on metformin 1000 mg in AM, hasn't tolerated in PM, novolin 70/30 40 units AM and ? units PM, not typically taking, endorses having to draw up as a barrier, would prefer pen if possible, and denies hypoglycemia , increased appetite, nausea, paresthesia of the feet, polydipsia, polyuria and visual disturbances. [name] [name] admits not taking glucose regularly, has all needed supplies, when checking fasting typically 170-180. Last A1C in the office was:  Lab Results  Component Value Date   HGBA1C 8.4 (H) 12/04/2019   Patient has been on thyroid Replacement since thyroidectomy for goiter in 2002. His medication was not changed last visit. Taking levothyroxine 137 mcg, takes with all other AM medications with breakfast, has always taken it this way.   Lab Results  Component Value Date   TSH 2.39 12/04/2019   Patient is on Vitamin D supplement, was out at the last visit, currently taking 4000 IU  Lab Results  Component Value Date   VD25OH 60 12/04/2019       Current Medications:  Current Outpatient Medications on File Prior to Visit  Medication Sig Dispense Refill  . aspirin EC 81 MG tablet Take 81 mg by mouth daily. Swallow whole.    . blood glucose meter kit and supplies KIT Dispense based on patient and insurance preference. Use up to 3 times daily as directed. (FOR ICD-10-E11.9 and Z79.4). (Patient taking differently: Inject 1 each into the skin See admin instructions. Dispense based on patient  and insurance preference. Use up to 3 times daily as directed. (FOR ICD-10-E11.9 and Z79.4).) 1 each 0  . citalopram (CELEXA) 40 MG tablet TAKE 1 TABLET ONCE DAILY. 90 tablet 0  . Continuous Blood Gluc Receiver (FREESTYLE LIBRE 2 READER) DEVI 1 each with 6 refills. Use as directed (Patient taking differently: 1 each by Other route as directed. ) 1 each 6  . Continuous Blood Gluc Sensor (FREESTYLE LIBRE 2 SENSOR) MISC Please supply one box and 6 refills. Use as directed (Patient taking differently: 1 each by Other route See admin instructions. Please supply one box and 6 refills. Use as directed) 1 each 6  . empagliflozin (JARDIANCE) 25 MG TABS tablet Take 25 mg by mouth daily.     Marland Kitchen levothyroxine (SYNTHROID) 137 MCG tablet Take 1 tablet Daily on an empty stomach with only water for 30 minutes & no Antacid meds, Calcium or Magnesium for 4 hours & avoid Biotin 90 tablet 0  . metFORMIN (GLUCOPHAGE-XR) 500 MG 24 hr tablet Take 2 tablets 2 x  /day with Meals for Diabetes (Patient taking differently: Take  1,000 mg by mouth 2 (two) times daily with a meal. ) 360 tablet 0  . omeprazole (PRILOSEC) 40 MG capsule Take       1 capsule       Daily        to Prevent Indigestion & Heartburn 90 capsule 0  . rosuvastatin (CRESTOR) 40 MG tablet Take   1 tablet     Daily   for Cholesterol 90 tablet 0  . Semaglutide,0.25 or 0.5MG /DOS, (OZEMPIC, 0.25 OR 0.5 MG/DOSE,) 2 MG/1.5ML SOPN Inject into the skin.    Marland Kitchen VITAMIN D PO Take 10,000 Units by mouth daily.      No current facility-administered medications on file prior to visit.   Medical History:  Past Medical History:  Diagnosis Date  . Anxiety   . ASTHMA 10/28/2006  . Coronary artery disease   . Cough   . Diabetes mellitus type II 2000   age 12  . GERD (gastroesophageal reflux disease)   . Hyperlipidemia   . Hypertension 1995  . Hypothyroidism 2002   thyroidectomy for Goiter  . Lumbar back pain   . NASH (nonalcoholic steatohepatitis)   . Numbness   . OSA  (obstructive sleep apnea) 11/19/2010  . Sleep apnea    Pt states mild case, doesn't use cpap   Allergies: No Known Allergies   Review of Systems:  Review of Systems  Constitutional: Negative for chills, diaphoresis, fever, malaise/fatigue and weight loss.  HENT: Negative for congestion, ear pain, hearing loss, sinus pain, sore throat and tinnitus.   Eyes: Negative.   Respiratory: Negative for cough, shortness of breath and wheezing.   Cardiovascular: Negative for chest pain, palpitations and leg swelling.  Gastrointestinal: Negative for blood in stool, constipation, diarrhea, heartburn, melena, nausea and vomiting.  Genitourinary: Negative for urgency.  Neurological: Negative for dizziness, tremors, focal weakness and headaches.  Psychiatric/Behavioral: Negative for depression and substance abuse. The patient is not nervous/anxious and does not have insomnia.     Family history- Review and unchanged  Social history- Review and unchanged  Physical Exam: There were no vitals taken for this visit. Wt Readings from Last 3 Encounters:  02/10/20 216 lb (98 kg)  12/04/19 221 lb 6.4 oz (100.4 kg)  11/14/19 216 lb 7.9 oz (98.2 kg)   General Appearance: Well nourished well developed, non-toxic appearing, in no apparent distress. Eyes: PERRLA, horizontal nystagmus with a rotational component, No conjunctival swelling or erythema ENT/Mouth: Ear canals clear with no erythema, swelling, or discharge.  TMs normal bilaterally, oropharynx clear, moist, with no exudate.   Neck: Supple, thyroid normal, no JVD, no cervical adenopathy.  Respiratory: Respiratory effort normal, breath sounds clear A&P, no wheeze, rhonchi or rales noted.  No retractions, no accessory muscle usage Cardio: RRR with no MRGs. No noted edema.  Abdomen: Soft, + BS.  Non tender, no guarding, rebound, hernias, masses. Musculoskeletal: Full ROM, 5/5 strength, Normal gait. R 2nd digit crosses over 3rd digit permanently.  Skin:  Warm, dry without rashes, lesions, ecchymosis.  Neuro: Awake and oriented X 3, Cranial nerves intact. No cerebellar symptoms.  Normal gait, normal strength.  Psych: normal affect, Insight and Judgment appropriate.    Garnet Sierras, NP 9:26 AM [facility]

## 2020-03-20 ENCOUNTER — Telehealth: Payer: Self-pay | Admitting: *Deleted

## 2020-03-20 NOTE — Telephone Encounter (Signed)
Tried to call patient regarding telephone visit Monday Dr Oval Linsey needs to change to video/mychart or in office visit Left message to call back

## 2020-03-22 NOTE — Progress Notes (Signed)
FOLLOW UP 3 MONTH   Assessment and Plan:   Essential hypertension  Will start Losartan 50mg  daily.  Was previously taking losartan/hctz 100/25 mg daily Monitor blood pressure at home; call if consistently over 130/80, discussed ideally <120/80 Continue DASH diet.   Reminder to go to the ER if any CP, SOB, nausea, dizziness, severe HA, changes vision/speech, left arm numbness and tingling and jaw pain.  Hyperlipademia associated with T2DM (HCC) Continued Rosuvastatin 40 mg nightly Reviewed LDL goal <70 Continue low cholesterol diet and exercise.  Check lipid panel.  Has tried atorvastatin in the past   Diabetes with diabetic chronic kidney disease Medications: metformin - add 3rd tab with dinner; increase to 2 tabs in PM if tolerating after 2 weeks for total of 4 tabs daily; discussed imodium PRN if initial diarrhea Ozempic 0.5mg  nightly Discussed Emertol as needed Discussed low glucose overnight is more dangerous than high; call with any concerns or hypoglycemia Discussed disease and risks Discussed diet/exercise, weight management  A1C  Hypothyroidism Taking levothyroxine 159mcg one tablet daily in am continue medications the same pending lab results reminded to take on an empty stomach 30-67mins before food.  check TSH level  Vitamin D Def Taking 4000 IU daily/more consistently Continue supplementation for goal of 60-100 Check vitamin D level  GERD Symptoms well managed without breakthrough Will try to get off PPI, Omeprazole 40mg , Given info for taper and famotidine sent in Discussed diet, avoiding triggers and other lifestyle changes  Depression/anxiety Continue medications: Citalopram 40mg  daily Lifestyle discussed: diet/exerise, sleep hygiene, stress management, hydration    Continue diet and meds as discussed. Further disposition pending results of labs. Discussed med's effects and SE's.    Future Appointments  Date Time Provider Callender   06/04/2020 10:00 AM Unk Pinto, MD [facility] None     HPI 54 y.o. male  presents for 3 month follow up with HTN, HLD, DMII, GERD, depression, hypothyroidism, CKD, OSA and vitamin D deficiency.  Reports overall he is doing well.  He works at [employer].  He exercises 2-3 times a week.  He reports he had a video appointment with Dr Oval Linsey this am as he was having some elevated blood pressure.  Controlled in office today.  Losartan 50mg  daily, he will start this tomorrow after getting prescription.  He is right handed, had injury right hand, saw Dr. Fredna Dow that wanted an MRI but the patient states it was too expensive.   He admits to drinking 6-8 beers a day.   he has a diagnosis of depression and is currently on celexa 40 mg daily, reports symptoms are well controlled on current regimen.   he has a diagnosis of GERD which is currently managed by omeprazole 20 mg daily  he reports symptoms is currently well controlled, and denies breakthrough reflux, burning in chest, hoarseness or cough.  He is having some mild intermittent nausea related to semiglutin 0.5mg  injections  BMI is Body mass index is 28.73 kg/m., he has not been working on diet and exercise, is generally active in his yardwork and going [place] most weekends.  He avoids sweet drinks Wt Readings from Last 3 Encounters:  03/23/20 206 lb (93.4 kg)  03/23/20 208 lb (94.3 kg)  02/10/20 216 lb (98 kg)   He does not currently check BP at home, today their BP is BP: 136/80  He does not workout. He denies chest pain, shortness of breath, dizziness. 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  He underwent left heart catheterization 08/26/2019 that revealed diffuse three-vessel disease, including 90% left main disease.  He subsequently underwent CABG (LIMA--> LAD, SVG-->D1/D2, SVG-->PDA, PL1, PL2) with Dr. Cyndia Bent.  Echo that admission revealed LVEF 40 to 45%  with grade 2 diastolic dysfunction.  LVEF improved to 60-65% on echo 9/021.  He was seen in the hospital 09/2019 with chest pain and dizziness.  He was hypotensive to the 38V systolic.  He last followed up with Jory Sims, DNP, on 09/2019 and was doing well.  He elected not to participate in cardiac rehab.  Last OV with [facility] was 02/10/20. He is on cholesterol medication (atorvastatin 80 mg daily) and denies myalgias. His cholesterol is not at goal. The cholesterol last visit was:   Lab Results  Component Value Date   CHOL 128 12/04/2019   HDL 47 12/04/2019   LDLCALC 61 12/04/2019   LDLDIRECT 120.3 11/07/2006   TRIG 121 12/04/2019   CHOLHDL 2.7 12/04/2019    He has not been working on diet and exercise for T2DM (on metformin 1000 mg in AM, hasn't tolerated in PM, novolin 70/30 40 units AM and ? units PM, not typically taking, endorses having to draw up as a barrier, would prefer pen if possible, and denies hypoglycemia , increased appetite, nausea, paresthesia of the feet, polydipsia, polyuria and visual disturbances. He admits not taking glucose regularly, has all needed supplies, when checking fasting typically 170-180. Last A1C in the office was:  Lab Results  Component Value Date   HGBA1C 8.4 (H) 12/04/2019   Patient has been on thyroid Replacement since thyroidectomy for goiter in 2002. His medication was not changed last visit. Taking levothyroxine 137 mcg, takes with all other AM medications with breakfast, has always taken it this way.   Lab Results  Component Value Date   TSH 2.39 12/04/2019   Patient is on Vitamin D supplement, was out at the last visit, currently taking 4000 IU  Lab Results  Component Value Date   VD25OH 60 12/04/2019       Current Medications:  Current Outpatient Medications on File Prior to Visit  Medication Sig Dispense Refill   aspirin EC 81 MG tablet Take 81 mg by mouth daily. Swallow whole.     blood glucose meter kit and supplies KIT Dispense  based on patient and insurance preference. Use up to 3 times daily as directed. (FOR ICD-10-E11.9 and Z79.4). (Patient taking differently: Inject 1 each into the skin See admin instructions. Dispense based on patient and insurance preference. Use up to 3 times daily as directed. (FOR ICD-10-E11.9 and Z79.4).) 1 each 0   citalopram (CELEXA) 40 MG tablet TAKE 1 TABLET ONCE DAILY. 90 tablet 0   Continuous Blood Gluc Receiver (FREESTYLE LIBRE 2 READER) DEVI 1 each with 6 refills. Use as directed (Patient taking differently: 1 each by Other route as directed.) 1 each 6   Continuous Blood Gluc Sensor (FREESTYLE LIBRE 2 SENSOR) MISC Please supply one box and 6 refills. Use as directed (Patient taking differently: 1 each by Other route See admin instructions. Please supply one box and 6 refills. Use as directed) 1 each 6   empagliflozin (JARDIANCE) 25 MG TABS tablet Take 25 mg by mouth daily.      levothyroxine (SYNTHROID) 137 MCG tablet Take 1 tablet Daily on an empty stomach with only water for 30 minutes & no Antacid meds, Calcium or Magnesium for 4 hours & avoid Biotin 90 tablet  0   metFORMIN (GLUCOPHAGE-XR) 500 MG 24 hr tablet Take 2 tablets 2 x  /day with Meals for Diabetes (Patient taking differently: Take 1,000 mg by mouth 2 (two) times daily with a meal.) 360 tablet 0   omeprazole (PRILOSEC) 40 MG capsule Take       1 capsule       Daily        to Prevent Indigestion & Heartburn 90 capsule 0   rosuvastatin (CRESTOR) 40 MG tablet Take   1 tablet     Daily   for Cholesterol 90 tablet 0   Semaglutide,0.25 or 0.5MG /DOS, (OZEMPIC, 0.25 OR 0.5 MG/DOSE,) 2 MG/1.5ML SOPN Inject 0.5 mg into the skin once a week.     VITAMIN D PO Take 10,000 Units by mouth daily.      No current facility-administered medications on file prior to visit.   Medical History:  Past Medical History:  Diagnosis Date   Anxiety    ASTHMA 10/28/2006   Coronary artery disease    Cough    Diabetes mellitus type II 2000    age 89   GERD (gastroesophageal reflux disease)    Hyperlipidemia    Hypertension 1995   Hypothyroidism 2002   thyroidectomy for Goiter   Lumbar back pain    NASH (nonalcoholic steatohepatitis)    Numbness    OSA (obstructive sleep apnea) 11/19/2010   Sleep apnea    Pt states mild case, doesn't use cpap   Allergies: No Known Allergies   Review of Systems:  Review of Systems  Constitutional: Negative for chills, diaphoresis, fever, malaise/fatigue and weight loss.  HENT: Negative for congestion, ear pain, hearing loss, sinus pain, sore throat and tinnitus.   Eyes: Negative.   Respiratory: Negative for cough, shortness of breath and wheezing.   Cardiovascular: Negative for chest pain, palpitations and leg swelling.  Gastrointestinal: Negative for blood in stool, constipation, diarrhea, heartburn, melena, nausea and vomiting.  Genitourinary: Negative for urgency.  Neurological: Negative for dizziness, tremors, focal weakness and headaches.  Psychiatric/Behavioral: Negative for depression and substance abuse. The patient is not nervous/anxious and does not have insomnia.     Family history- Review and unchanged  Social history- Review and unchanged  Physical Exam: BP 136/80    Pulse 62    Temp 97.6 F (36.4 C)    Ht 5\' 11"  (1.803 m)    Wt 206 lb (93.4 kg)    SpO2 96%    BMI 28.73 kg/m  Wt Readings from Last 3 Encounters:  03/23/20 206 lb (93.4 kg)  03/23/20 208 lb (94.3 kg)  02/10/20 216 lb (98 kg)   General Appearance: Well nourished well developed, non-toxic appearing, in no apparent distress. Eyes: PERRLA, horizontal nystagmus with a rotational component, No conjunctival swelling or erythema ENT/Mouth: Ear canals clear with no erythema, swelling, or discharge.  TMs normal bilaterally, oropharynx clear, moist, with no exudate.   Neck: Supple, thyroid normal, no JVD, no cervical adenopathy.  Respiratory: Respiratory effort normal, breath sounds clear A&P, no  wheeze, rhonchi or rales noted.  No retractions, no accessory muscle usage Cardio: RRR with no MRGs. No noted edema.  Abdomen: Soft, + BS.  Non tender, no guarding, rebound, hernias, masses. Musculoskeletal: Full ROM, 5/5 strength, Normal gait. R 2nd digit crosses over 3rd digit permanently.  Skin: Warm, dry without rashes, lesions, ecchymosis.  Neuro: Awake and oriented X 3, Cranial nerves intact. No cerebellar symptoms.  Normal gait, normal strength.  Psych: normal affect, Insight and  Judgment appropriate.    Garnet Sierras, NP 11:23 AM [facility]

## 2020-03-23 ENCOUNTER — Encounter: Payer: Self-pay | Admitting: Cardiovascular Disease

## 2020-03-23 ENCOUNTER — Telehealth: Payer: Self-pay | Admitting: Cardiovascular Disease

## 2020-03-23 ENCOUNTER — Telehealth (INDEPENDENT_AMBULATORY_CARE_PROVIDER_SITE_OTHER): Payer: Managed Care, Other (non HMO) | Admitting: Cardiovascular Disease

## 2020-03-23 ENCOUNTER — Encounter: Payer: Self-pay | Admitting: Adult Health Nurse Practitioner

## 2020-03-23 ENCOUNTER — Ambulatory Visit (INDEPENDENT_AMBULATORY_CARE_PROVIDER_SITE_OTHER): Payer: Managed Care, Other (non HMO) | Admitting: Adult Health Nurse Practitioner

## 2020-03-23 ENCOUNTER — Other Ambulatory Visit: Payer: Self-pay

## 2020-03-23 VITALS — BP 136/80 | HR 62 | Temp 97.6°F | Ht 71.0 in | Wt 206.0 lb

## 2020-03-23 VITALS — BP 141/94 | HR 100 | Ht 70.0 in | Wt 208.0 lb

## 2020-03-23 DIAGNOSIS — E1122 Type 2 diabetes mellitus with diabetic chronic kidney disease: Secondary | ICD-10-CM

## 2020-03-23 DIAGNOSIS — I25110 Atherosclerotic heart disease of native coronary artery with unstable angina pectoris: Secondary | ICD-10-CM

## 2020-03-23 DIAGNOSIS — E1169 Type 2 diabetes mellitus with other specified complication: Secondary | ICD-10-CM

## 2020-03-23 DIAGNOSIS — F419 Anxiety disorder, unspecified: Secondary | ICD-10-CM

## 2020-03-23 DIAGNOSIS — E559 Vitamin D deficiency, unspecified: Secondary | ICD-10-CM

## 2020-03-23 DIAGNOSIS — I10 Essential (primary) hypertension: Secondary | ICD-10-CM

## 2020-03-23 DIAGNOSIS — E039 Hypothyroidism, unspecified: Secondary | ICD-10-CM | POA: Diagnosis not present

## 2020-03-23 DIAGNOSIS — I214 Non-ST elevation (NSTEMI) myocardial infarction: Secondary | ICD-10-CM

## 2020-03-23 DIAGNOSIS — K219 Gastro-esophageal reflux disease without esophagitis: Secondary | ICD-10-CM

## 2020-03-23 DIAGNOSIS — E785 Hyperlipidemia, unspecified: Secondary | ICD-10-CM

## 2020-03-23 DIAGNOSIS — G4733 Obstructive sleep apnea (adult) (pediatric): Secondary | ICD-10-CM

## 2020-03-23 DIAGNOSIS — Z5181 Encounter for therapeutic drug level monitoring: Secondary | ICD-10-CM

## 2020-03-23 DIAGNOSIS — Z794 Long term (current) use of insulin: Secondary | ICD-10-CM

## 2020-03-23 DIAGNOSIS — F32A Depression, unspecified: Secondary | ICD-10-CM

## 2020-03-23 DIAGNOSIS — N181 Chronic kidney disease, stage 1: Secondary | ICD-10-CM

## 2020-03-23 MED ORDER — LOSARTAN POTASSIUM 50 MG PO TABS
50.0000 mg | ORAL_TABLET | Freq: Every day | ORAL | 3 refills | Status: DC
Start: 2020-03-23 — End: 2020-09-24

## 2020-03-23 NOTE — Telephone Encounter (Signed)
Patient had visit with Dr Oval Linsey today

## 2020-03-23 NOTE — Telephone Encounter (Signed)
Spoke with patient and reviewed AVS and medication changes Rx sent to [facility]

## 2020-03-23 NOTE — Telephone Encounter (Signed)
Left message to call back  

## 2020-03-23 NOTE — Telephone Encounter (Signed)
Patient states that Dr. Oval Linsey was supposed to send a medication for his BP to his pharmacy but they have not received anything. He says it was a combo drug of Hydrochlorothiazide and Losartan. I did not see this on his medication list. Please advise.

## 2020-03-23 NOTE — Patient Instructions (Addendum)
Medication Instructions:  START LOSARTAN 50 MG DAILY   *If you need a refill on your cardiac medications before your next appointment, please call your pharmacy*  Lab Work: NONE   Testing/Procedures: NONE   Follow-Up: At [facility], you and your health needs are our priority.  As part of our continuing mission to provide you with exceptional heart care, we have created designated Provider Care Teams.  These Care Teams include your primary Cardiologist (physician) and Advanced Practice Providers (APPs -  Physician Assistants and Nurse Practitioners) who all work together to provide you with the care you need, when you need it.  We recommend signing up for the patient portal called "MyChart".  Sign up information is provided on this After Visit Summary.  MyChart is used to connect with patients for Virtual Visits (Telemedicine).  Patients are able to view lab/test results, encounter notes, upcoming appointments, etc.  Non-urgent messages can be sent to your provider as well.   To learn more about what you can do with MyChart, go to [email or web address].    Your next appointment:    09/24/2020 11:20 AM WITH DR [facility]  05/25/2020 AT 11:30 AM WITH PHARM D

## 2020-03-23 NOTE — Progress Notes (Signed)
Virtual Visit via Telephone Note   This visit type was conducted due to national recommendations for restrictions regarding the COVID-19 Pandemic (e.g. social distancing) in an effort to limit this patient's exposure and mitigate transmission in our community.  Due to his co-morbid illnesses, this patient is at least at moderate risk for complications without adequate follow up.  This format is felt to be most appropriate for this patient at this time.  The patient did not have access to video technology/had technical difficulties with video requiring transitioning to audio format only (telephone).  All issues noted in this document were discussed and addressed.  No physical exam could be performed with this format.  Please refer to the patient's chart for his  consent to telehealth for [facility].   The patient was identified using 2 identifiers.  Date:  03/23/2020   ID:  [name] [name], DOB 12-26-1965, MRN [id number]  Patient Location: Home Provider Location: Office/Clinic  PCP:  Unk Pinto, MD  Cardiologist:  Skeet Latch, MD  Electrophysiologist:  None   Evaluation Performed:  Follow-Up Visit  Chief Complaint:  hypertension  History of Present Illness:    The patient does not have symptoms concerning for COVID-19 infection (fever, chills, cough, or new shortness of breath).   Chief Complaint  Patient presents with  . Telehealth Consent    1 month.     History of Present Illness: [name] [name] is a 54 y.o. male with chronic systolic diastolic heart failure, CAD status post CABG 08/2019, mild ascending aorta aneurysm, diabetes, CKD, hypothyroidism, GERD, anxiety, OSA, and obesity who presents for follow-up.  He was initially seen in the hospital 08/2019 with chest pain in the setting of NSTEMI.  He presented to [place] and was transferred to [facility].  High-sensitivity troponin was over 13,000.  He underwent left heart catheterization  08/26/2019 that revealed diffuse three-vessel disease, including 90% left main disease.  He subsequently underwent CABG (LIMA--> LAD, SVG-->D1/D2, SVG-->PDA, PL1, PL2) with Dr. Cyndia Bent.  Echo that admission revealed LVEF 40 to 45% with grade 2 diastolic dysfunction.  LVEF improved to 60-65% on echo 9/021.  He was seen in the hospital 09/2019 with chest pain and dizziness.  He was hypotensive to the 64B systolic.  He elected not to participate in cardiac rehab.  He is back working at the [employer].   At his last appointment Mr. Madlock blood pressure was elevated.   Lately his BP has been 110-140s/70-90s.  He has been active but not getting as many walks as he was in the past.  He has no exertional chest pain or shortness of breath.  He denies lower extremity edema, orthopnea, or PND.  For the last couple of days he has been feeling nauseous. He attributes this to the Ozempic.  He has otherwise been doing well.  He likes that the medication is helping him to lose weight.    Past Medical History:  Diagnosis Date  . Anxiety   . ASTHMA 10/28/2006  . Coronary artery disease   . Cough   . Diabetes mellitus type II 2000   age 62  . GERD (gastroesophageal reflux disease)   . Hyperlipidemia   . Hypertension 1995  . Hypothyroidism 2002   thyroidectomy for Goiter  . Lumbar back pain   . NASH (nonalcoholic steatohepatitis)   . Numbness   . OSA (obstructive sleep apnea) 11/19/2010  . Sleep apnea    Pt states mild case, doesn't  use cpap    Past Surgical History:  Procedure Laterality Date  . bilateral shoulder surgery    . CARDIAC CATHETERIZATION    . CORONARY ARTERY BYPASS GRAFT N/A 08/27/2019   Procedure: CORONARY ARTERY BYPASS GRAFTING (CABG) x7, LIMA TO LAD, SVG TO DIAG 1 WITH SEQUENTIAL SVG TO DIAG 2, SVG TO OM2, SVG TO PDA WITH SEQUENTIAL SVG TO PLB WITH SEQUENTIAL SVG TO OTHER.;  Surgeon: Gaye Pollack, MD;  Location: [facility] OR;  Service: Open Heart Surgery;  Laterality: N/A;  .  ENDOVEIN HARVEST OF GREATER SAPHENOUS VEIN Right 08/27/2019   Procedure: [place] Of Greater Saphenous Vein;  Surgeon: Gaye Pollack, MD;  Location: [facility] OR;  Service: Open Heart Surgery;  Laterality: Right;  . LEFT HEART CATH AND CORONARY ANGIOGRAPHY N/A 08/26/2019   Procedure: LEFT HEART CATH AND CORONARY ANGIOGRAPHY;  Surgeon: Nelva Bush, MD;  Location: [facility];  Service: Cardiovascular;  Laterality: N/A;  . right knee repair  1997  . TEE WITHOUT CARDIOVERSION N/A 08/27/2019   Procedure: TRANSESOPHAGEAL ECHOCARDIOGRAM (TEE);  Surgeon: Gaye Pollack, MD;  Location: [facility];  Service: Open Heart Surgery;  Laterality: N/A;  . THYROIDECTOMY     right  . TRIGGER FINGER RELEASE    . TRIGGER FINGER RELEASE  02/23/2011   Procedure: RELEASE TRIGGER FINGER/A-1 PULLEY;  Surgeon: Meredith Pel;  Location: [place];  Service: Orthopedics;  Laterality: Right;  Right 4th trigger finger release     Current Outpatient Medications  Medication Sig Dispense Refill  . aspirin EC 81 MG tablet Take 81 mg by mouth daily. Swallow whole.    . blood glucose meter kit and supplies KIT Dispense based on patient and insurance preference. Use up to 3 times daily as directed. (FOR ICD-10-E11.9 and Z79.4). (Patient taking differently: Inject 1 each into the skin See admin instructions. Dispense based on patient and insurance preference. Use up to 3 times daily as directed. (FOR ICD-10-E11.9 and Z79.4).) 1 each 0  . citalopram (CELEXA) 40 MG tablet TAKE 1 TABLET ONCE DAILY. 90 tablet 0  . Continuous Blood Gluc Receiver (FREESTYLE LIBRE 2 READER) DEVI 1 each with 6 refills. Use as directed (Patient taking differently: 1 each by Other route as directed.) 1 each 6  . Continuous Blood Gluc Sensor (FREESTYLE LIBRE 2 SENSOR) MISC Please supply one box and 6 refills. Use as directed (Patient taking differently: 1 each by Other route See admin instructions. Please supply one box and 6 refills. Use as directed)  1 each 6  . empagliflozin (JARDIANCE) 25 MG TABS tablet Take 25 mg by mouth daily.     Marland Kitchen levothyroxine (SYNTHROID) 137 MCG tablet Take 1 tablet Daily on an empty stomach with only water for 30 minutes & no Antacid meds, Calcium or Magnesium for 4 hours & avoid Biotin 90 tablet 0  . metFORMIN (GLUCOPHAGE-XR) 500 MG 24 hr tablet Take 2 tablets 2 x  /day with Meals for Diabetes (Patient taking differently: Take 1,000 mg by mouth 2 (two) times daily with a meal.) 360 tablet 0  . omeprazole (PRILOSEC) 40 MG capsule Take       1 capsule       Daily        to Prevent Indigestion & Heartburn 90 capsule 0  . rosuvastatin (CRESTOR) 40 MG tablet Take   1 tablet     Daily   for Cholesterol 90 tablet 0  . Semaglutide,0.25 or 0.5MG/DOS, (OZEMPIC, 0.25 OR 0.5 MG/DOSE,) 2  MG/1.5ML SOPN Inject 0.5 mg into the skin once a week.    Marland Kitchen VITAMIN D PO Take 10,000 Units by mouth daily.      No current facility-administered medications for this visit.    Allergies:   Patient has no known allergies.    Social History:  The patient  reports that he has never smoked. He has never used smokeless tobacco. He reports current alcohol use. He reports current drug use. Drug: Marijuana.   Family History:  The patient's family history includes Breast cancer in his mother; Heart attack in his father; Osteoarthritis in his father; Skin cancer in his father.    ROS:  Please see the history of present illness.   Otherwise, review of systems are positive for none.   All other systems are reviewed and negative.    PHYSICAL EXAM: VS:  BP (!) 141/94   Pulse 100   Ht 5' 10" (1.778 m)   Wt 208 lb (94.3 kg)   BMI 29.84 kg/m  , BMI Body mass index is 29.84 kg/m. GENERAL:  Well appearing HEENT:  Pupils equal round and reactive, fundi not visualized, oral mucosa unremarkable NECK:  No jugular venous distention, waveform within normal limits, carotid upstroke brisk and symmetric, no bruits LUNGS:  Clear to auscultation  bilaterally HEART:  RRR.  PMI not displaced or sustained,S1 and S2 within normal limits, no S3, no S4, no clicks, no rubs, no murmurs ABD:  Flat, positive bowel sounds normal in frequency in pitch, no bruits, no rebound, no guarding, no midline pulsatile mass, no hepatomegaly, no splenomegaly EXT:  2 plus pulses throughout, no edema, no cyanosis no clubbing SKIN:  No rashes no nodules NEURO:  Cranial nerves II through XII grossly intact, motor grossly intact throughout PSYCH:  Cognitively intact, oriented to person place and time   EKG:  EKG is ordered today. The ekg ordered today demonstrates sinus rhythm.  Rate 88 bpm.   Echo 08/26/19: 1. Left ventricular ejection fraction, by estimation, is 40 to 45%. The  left ventricle has mildly decreased function. The left ventricle  demonstrates regional wall motion abnormalities (see scoring  diagram/findings for description). Left ventricular  diastolic parameters are consistent with Grade II diastolic dysfunction  (pseudonormalization). There is moderate hypokinesis of the left  ventricular, mid-apical anteroseptal wall.  2. Right ventricular systolic function is normal. The right ventricular  size is normal.  3. The mitral valve is normal in structure. Mild mitral valve  regurgitation. No evidence of mitral stenosis.  4. The aortic valve is normal in structure. Aortic valve regurgitation is  not visualized. No aortic stenosis is present.   Echo 12/2019:  1. Left ventricular ejection fraction, by estimation, is 55 to 60%. The  left ventricle has normal function. The left ventricle has no regional  wall motion abnormalities. Left ventricular diastolic parameters are  consistent with Grade I diastolic  dysfunction (impaired relaxation).  2. Right ventricular systolic function is mildly reduced. The right  ventricular size is normal. Tricuspid regurgitation signal is inadequate  for assessing PA pressure.  3. The mitral valve is  normal in structure. Trivial mitral valve  regurgitation. No evidence of mitral stenosis.  4. The aortic valve is tricuspid. Aortic valve regurgitation is not  visualized. No aortic stenosis is present.  5. Aortic dilatation noted. There is mild dilatation of the aortic root,  measuring 40 mm.  6. The inferior vena cava is normal in size with greater than 50%  respiratory variability, suggesting right  atrial pressure of 3 mmHg.   LHC 08/26/19: Diagnostic Dominance: Right   Conclusions: 1. Severe multivessel coronary artery disease, including 90% distal LMCA stenosis, as well as 70-80% lesions involving the mid LAD, D1, large OM1, and rPL branches. 2. Mildly to moderately reduced left ventricular contraction (LVEF ~45%) with anterior hypokinesis. Upper normal left ventricular filling pressure.  Carotid Doppler 08/26/19: Right Carotid: Velocities in the right ICA are consistent with a 40-59%         stenosis.   Left Carotid: Velocities in the left ICA are consistent with a 1-39%  stenosis.  Vertebrals: Bilateral vertebral arteries demonstrate antegrade flow.  Subclavians: Normal flow hemodynamics were seen in bilateral subclavian        arteries.  ABIs: Right ABI: Resting right ankle-brachial index is within normal range. No  evidence of significant right lower extremity arterial disease.  Left ABI: Resting left ankle-brachial index is within normal range. No  evidence of significant left lower extremity arterial disease.  Left Upper Extremity: Doppler waveforms decrease >50% with left radial  compression. Doppler waveform obliterate with left ulnar compression.     Recent Labs: 12/04/2019: ALT 21; BUN 23; Creat 0.94; Hemoglobin 15.3; Magnesium 1.9; Platelets 223; Potassium 4.4; Sodium 135; TSH 2.39    Lipid Panel    Component Value Date/Time   CHOL 128 12/04/2019 0936   TRIG 121 12/04/2019 0936   HDL 47 12/04/2019 0936   CHOLHDL 2.7 12/04/2019 0936    VLDL 23 08/26/2019 0749   LDLCALC 61 12/04/2019 0936   LDLDIRECT 120.3 11/07/2006 1321      Wt Readings from Last 3 Encounters:  03/23/20 208 lb (94.3 kg)  02/10/20 216 lb (98 kg)  12/04/19 221 lb 6.4 oz (100.4 kg)      ASSESSMENT AND PLAN:  # CAD s/p CABG:  # Hyperlipidemia: Mr. Biehn is doing well.  He has no exertional symptoms.  He was encouraged to increase exercise to 150 minutes weekly.  Continue aspirin.  Lipids are well-controlled on rosuvastatin.  # Essential hypertension: BP remains elevated.  We will add losartan 50 mg daily.  Check BP within a month.  He had fatigue on beta blocker.  Will add low dose in the future if he is amenable.   # Chronic systolic and diastolic heart failure:   LVEF 40 to 45% prior to revascularization.  Repeat echo 12/2019 revealed improvement to 55 to 60%.  He is euvolemic and doing well.  Add beta blocker if able as above.  Continue Jardiance.  # Ascending aorta aneurysm:  4.0 cm on echo 12/2019.  Add beta blocker as above.  Repeat echo 12/2020.  Current medicines are reviewed at length with the patient today.  The patient does not have concerns regarding medicines.  The following changes have been made:  no change  Labs/ tests ordered today include:   No orders of the defined types were placed in this encounter.    Disposition:   FU with APP or PharmD in 2 months.  F/u with me in 6 months.   COVID-19 Education: The signs and symptoms of COVID-19 were discussed with the patient and how to seek care for testing (follow up with PCP or arrange E-visit).  The importance of social distancing was discussed today.  Time:   Today, I have spent 15 minutes with the patient with telehealth technology discussing the above problems.    Signed, Letia Guidry C. Oval Linsey, MD, [facility]  03/23/2020 9:11 AM    [facility]

## 2020-03-23 NOTE — Addendum Note (Signed)
Addended by: Alvina Filbert B on: 03/23/2020 02:41 PM   Modules accepted: Orders

## 2020-03-24 LAB — COMPLETE METABOLIC PANEL WITH GFR
AG Ratio: 1.5 (calc) (ref 1.0–2.5)
ALT: 26 U/L (ref 9–46)
AST: 25 U/L (ref 10–35)
Albumin: 4.8 g/dL (ref 3.6–5.1)
Alkaline phosphatase (APISO): 74 U/L (ref 35–144)
BUN: 20 mg/dL (ref 7–25)
CO2: 25 mmol/L (ref 20–32)
Calcium: 9.8 mg/dL (ref 8.6–10.3)
Chloride: 101 mmol/L (ref 98–110)
Creat: 0.94 mg/dL (ref 0.70–1.33)
GFR, Est African American: 105 mL/min/{1.73_m2} (ref >=60)
GFR, Est Non African American: 90 mL/min/{1.73_m2} (ref >=60)
Globulin: 3.3 g/dL (calc) (ref 1.9–3.7)
Glucose, Bld: 121 mg/dL — ABNORMAL HIGH (ref 65–99)
Potassium: 4.4 mmol/L (ref 3.5–5.3)
Sodium: 139 mmol/L (ref 135–146)
Total Bilirubin: 0.6 mg/dL (ref 0.2–1.2)
Total Protein: 8.1 g/dL (ref 6.1–8.1)

## 2020-03-24 LAB — CBC WITH DIFFERENTIAL/PLATELET
Absolute Monocytes: 639 cells/uL (ref 200–950)
Basophils Absolute: 59 cells/uL (ref 0–200)
Basophils Relative: 1.3 %
Eosinophils Absolute: 171 cells/uL (ref 15–500)
Eosinophils Relative: 3.8 %
HCT: 45.4 % (ref 38.5–50.0)
Hemoglobin: 15.9 g/dL (ref 13.2–17.1)
Lymphs Abs: 1278 cells/uL (ref 850–3900)
MCH: 32.1 pg (ref 27.0–33.0)
MCHC: 35 g/dL (ref 32.0–36.0)
MCV: 91.5 fL (ref 80.0–100.0)
MPV: 10.3 fL (ref 7.5–12.5)
Monocytes Relative: 14.2 %
Neutro Abs: 2354 cells/uL (ref 1500–7800)
Neutrophils Relative %: 52.3 %
Platelets: 210 10*3/uL (ref 140–400)
RBC: 4.96 10*6/uL (ref 4.20–5.80)
RDW: 14.2 % (ref 11.0–15.0)
Total Lymphocyte: 28.4 %
WBC: 4.5 10*3/uL (ref 3.8–10.8)

## 2020-03-24 LAB — HEMOGLOBIN A1C
Hgb A1c MFr Bld: 7.3 % of total Hgb — ABNORMAL HIGH (ref <5.7)
Mean Plasma Glucose: 163 mg/dL
eAG (mmol/L): 9 mmol/L

## 2020-03-24 LAB — TSH: TSH: 1.47 mIU/L (ref 0.40–4.50)

## 2020-03-24 LAB — LIPID PANEL
Cholesterol: 120 mg/dL (ref <200)
HDL: 54 mg/dL (ref >=40)
LDL Cholesterol (Calc): 52 mg/dL (calc)
Non-HDL Cholesterol (Calc): 66 mg/dL (calc) (ref <130)
Total CHOL/HDL Ratio: 2.2 (calc) (ref <5.0)
Triglycerides: 60 mg/dL (ref <150)

## 2020-04-16 ENCOUNTER — Other Ambulatory Visit: Payer: Self-pay | Admitting: Adult Health

## 2020-04-16 ENCOUNTER — Other Ambulatory Visit: Payer: Self-pay | Admitting: Internal Medicine

## 2020-05-25 ENCOUNTER — Ambulatory Visit: Payer: Managed Care, Other (non HMO)

## 2020-05-25 ENCOUNTER — Encounter: Payer: Managed Care, Other (non HMO) | Admitting: Internal Medicine

## 2020-06-04 ENCOUNTER — Telehealth: Payer: Self-pay

## 2020-06-04 ENCOUNTER — Encounter: Payer: Managed Care, Other (non HMO) | Admitting: Internal Medicine

## 2020-06-04 NOTE — Telephone Encounter (Signed)
LM TO COMPLETE LABS

## 2020-06-10 ENCOUNTER — Ambulatory Visit: Payer: Managed Care, Other (non HMO) | Admitting: Pharmacist Clinician (PhC)/ Clinical Pharmacy Specialist

## 2020-06-10 ENCOUNTER — Other Ambulatory Visit: Payer: Self-pay

## 2020-06-10 DIAGNOSIS — I10 Essential (primary) hypertension: Secondary | ICD-10-CM

## 2020-06-10 MED ORDER — CARVEDILOL 3.125 MG PO TABS
3.1250 mg | ORAL_TABLET | Freq: Two times a day (BID) | ORAL | 3 refills | Status: DC
Start: 2020-06-10 — End: 2020-12-22

## 2020-06-10 NOTE — Assessment & Plan Note (Signed)
Patient with essential hypertension, currently not well controlled.   With his heart rate at 101, would prefer to start a beta blocker today.  He is hesitant, due to hypotensive episode from last year with metoprolol.  Explained that that reaction is much less likely to happen now that his BP and HR are both elevated.  Will start him on carvedilol 3.125 mg bid and titrate up as he tolerates.  Asked that he check home BP readings and bring log as well as his home cuff to follow up.  I don't expect that this low dose will get him to goal, but will start low because of his hesitancy.  Suggested that if his pressure remains high would benefit from switching losartan to olmesartan at his next visit.

## 2020-06-10 NOTE — Patient Instructions (Signed)
Return for a a follow up appointment Wed April 6 at 10:30 am  Go to the lab today to repeat kidney function  Check your blood pressure at home daily and keep record of the readings.  Take your BP meds as follows:  Start carvedilol 3.125 mg twice daily  Continue with all other medications  Bring all of your meds, your BP cuff and your record of home blood pressures to your next appointment.  Exercise as you're able, try to walk approximately 30 minutes per day.  Keep salt intake to a minimum, especially watch canned and prepared boxed foods.  Eat more fresh fruits and vegetables and fewer canned items.  Avoid eating in fast food restaurants.    HOW TO TAKE YOUR BLOOD PRESSURE: . Rest 5 minutes before taking your blood pressure. .  Don't smoke or drink caffeinated beverages for at least 30 minutes before. . Take your blood pressure before (not after) you eat. . Sit comfortably with your back supported and both feet on the floor (don't cross your legs). . Elevate your arm to heart level on a table or a desk. . Use the proper sized cuff. It should fit smoothly and snugly around your bare upper arm. There should be enough room to slip a fingertip under the cuff. The bottom edge of the cuff should be 1 inch above the crease of the elbow. . Ideally, take 3 measurements at one sitting and record the average.

## 2020-06-10 NOTE — Progress Notes (Signed)
06/10/2020 [name] [name] 07-05-1965 [id number]   HPI:  [name] [name] is a 55 y.o. male patient of Dr [name] [name], with a [place] below who presents today for hypertension clinic evaluation.  He was seen by Dr. Oval [name] back in November at which time his pressure was noted to be 160/104.  At that time she did not give him any medications, as his pressure had been 017-494 systolic at previous visits.  He was asked to monitor and work on lifestyle modifications.  He then saw his PCP in December and when pressure was still elevated he was started on losartan 50 mg daily.  Patient notes that prior to his CABG he had been on losartan hctz, however his pressure was low/normal after discharge and the medication was discontinued.  On discharge he was instead given metoprolol 25 mg bid, but with his soft pressure he became hypotensive and this was discontinued shortly thereafter.    Today he is feeling well overall.  Admits that he forgot his losartan this morning, didn't take it until almost noon, and also drank a large cup of coffee from [employer].    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  DM2 A1c 7.3  - on Jardiance 25 mg qd, metformin 1g mbid, Ozempic 0.5 mg qw  hyperlipidemia LDL 52 on rosuvastatin 40 mg  hypothyroid TSH 1.47  On levothyroxine 137 mcg  OSA Mild, does not use CPAP     Blood Pressure Goal:  130/80  Current Medications: losartan 50 mg qd  Family Hx: father had MI at 75, just died recently at 84; mother doing well at 102; sister died due to suspected MI (sudden death in her sleep) at 46; 2 kids 4,30, older son with hypertension  Social Hx: no, occasional beer, coffee about 2 cups per day, no caffeine sodas  Diet: eats out more recently, he and wife both work so not home until dinner time.  Tries to avoid fried foods   Exercise: golf every week or two, working at [employer] now, would like to go back to fitness center when it opens again   Home BP readings: has  not been checking - has a wrist cuff at home, but unsure if works correctly  Intolerances: nkda  Labs: 03/2019: Na 139, K 4.4, Glu 121, BUN 20, SCr 0.94, GFR 90  Wt Readings from Last 3 Encounters:  06/10/20 209 lb 6.4 oz (95 kg)  03/23/20 206 lb (93.4 kg)  03/23/20 208 lb (94.3 kg)   BP Readings from Last 3 Encounters:  06/10/20 (!) 140/92  03/23/20 136/80  03/23/20 (!) 141/94   Pulse Readings from Last 3 Encounters:  06/10/20 (!) 101  03/23/20 62  03/23/20 100    Current Outpatient Medications  Medication Sig Dispense Refill  . aspirin EC 81 MG tablet Take 81 mg by mouth daily. Swallow whole.    . blood glucose meter kit and supplies KIT Dispense based on patient and insurance preference. Use up to 3 times daily as directed. (FOR ICD-10-E11.9 and Z79.4). (Patient taking differently: Inject 1 each into the skin See admin instructions. Dispense based on patient and insurance preference. Use up to 3 times daily as directed. (FOR ICD-10-E11.9 and Z79.4).) 1 each 0  . carvedilol (COREG) 3.125 MG tablet Take 1 tablet (3.125 mg total) by mouth 2 (two) times daily. 60 tablet 3  . citalopram (CELEXA) 40 MG tablet Take   1 tablet   Daily    for Mood  90 tablet 0  . Continuous Blood Gluc Receiver (FREESTYLE LIBRE 2 READER) DEVI 1 each with 6 refills. Use as directed (Patient taking differently: 1 each by Other route as directed.) 1 each 6  . Continuous Blood Gluc Sensor (FREESTYLE LIBRE 2 SENSOR) MISC Please supply one box and 6 refills. Use as directed (Patient taking differently: 1 each by Other route See admin instructions. Please supply one box and 6 refills. Use as directed) 1 each 6  . empagliflozin (JARDIANCE) 25 MG TABS tablet Take 25 mg by mouth daily.     Marland Kitchen levothyroxine (SYNTHROID) 137 MCG tablet Take   1 tablet    Daily    on an empty stomach with only water for 30 minutes & no Antacid meds, Calcium or Magnesium for 4 hours & avoid Biotin 90 tablet 0  . losartan (COZAAR) 50 MG  tablet Take 1 tablet (50 mg total) by mouth daily. 90 tablet 3  . metFORMIN (GLUCOPHAGE-XR) 500 MG 24 hr tablet Take 2 tablets 2 x  /day with Meals for Diabetes (Patient taking differently: Take 1,000 mg by mouth 2 (two) times daily with a meal.) 360 tablet 0  . omeprazole (PRILOSEC) 40 MG capsule Take       1 capsule       Daily        to Prevent Indigestion & Heartburn 90 capsule 0  . rosuvastatin (CRESTOR) 40 MG tablet Take   1 tablet   Daily    for Cholesterol 90 tablet 0  . Semaglutide,0.25 or 0.5MG/DOS, (OZEMPIC, 0.25 OR 0.5 MG/DOSE,) 2 MG/1.5ML SOPN Inject 0.5 mg into the skin once a week.    Marland Kitchen VITAMIN D PO Take 10,000 Units by mouth daily.      No current facility-administered medications for this visit.    No Known Allergies  Past Medical History:  Diagnosis Date  . Anxiety   . ASTHMA 10/28/2006  . Coronary artery disease   . Cough   . Diabetes mellitus type II 2000   age 53  . GERD (gastroesophageal reflux disease)   . Hyperlipidemia   . Hypertension 1995  . Hypothyroidism 2002   thyroidectomy for Goiter  . Lumbar back pain   . NASH (nonalcoholic steatohepatitis)   . Numbness   . OSA (obstructive sleep apnea) 11/19/2010  . Sleep apnea    Pt states mild case, doesn't use cpap    Blood pressure (!) 140/92, pulse (!) 101, resp. rate 15, height _0  (1.778 m), weight 209 lb 6.4 oz (95 kg), SpO2 95 %.  Essential hypertension Patient with essential hypertension, currently not well controlled.   With his heart rate at 101, would prefer to start a beta blocker today.  He is hesitant, due to hypotensive episode from last year with metoprolol.  Explained that that reaction is much less likely to happen now that his BP and HR are both elevated.  Will start him on carvedilol 3.125 mg bid and titrate up as he tolerates.  Asked that he check home BP readings and bring log as well as his home cuff to follow up.  I don't expect that this low dose will get him to goal, but will start low  because of his hesitancy.  Suggested that if his pressure remains high would benefit from switching losartan to olmesartan at his next visit.     Tommy Medal PharmD CPP [facility] [address] [place], [place] [address] [phone]

## 2020-06-11 LAB — BASIC METABOLIC PANEL
BUN/Creatinine Ratio: 21 — ABNORMAL HIGH (ref 9–20)
BUN: 19 mg/dL (ref 6–24)
CO2: 18 mmol/L — ABNORMAL LOW (ref 20–29)
Calcium: 9.8 mg/dL (ref 8.7–10.2)
Chloride: 101 mmol/L (ref 96–106)
Creatinine, Ser: 0.89 mg/dL (ref 0.76–1.27)
Glucose: 181 mg/dL — ABNORMAL HIGH (ref 65–99)
Potassium: 4.2 mmol/L (ref 3.5–5.2)
Sodium: 138 mmol/L (ref 134–144)
eGFR: 100 mL/min/{1.73_m2} (ref >59)

## 2020-06-24 ENCOUNTER — Other Ambulatory Visit: Payer: Self-pay | Admitting: Internal Medicine

## 2020-06-24 MED ORDER — OMEPRAZOLE 40 MG PO CPDR: DELAYED_RELEASE_CAPSULE | ORAL | 0 refills | Status: DC

## 2020-07-08 ENCOUNTER — Ambulatory Visit (INDEPENDENT_AMBULATORY_CARE_PROVIDER_SITE_OTHER): Payer: Managed Care, Other (non HMO) | Admitting: Pharmacist

## 2020-07-08 ENCOUNTER — Other Ambulatory Visit: Payer: Self-pay

## 2020-07-08 VITALS — BP 130/88 | HR 88 | Resp 15 | Ht 70.0 in | Wt 208.4 lb

## 2020-07-08 DIAGNOSIS — I10 Essential (primary) hypertension: Secondary | ICD-10-CM

## 2020-07-08 NOTE — Progress Notes (Signed)
HPI:  [name] [name] is a 55 y.o. male patient of Dr Oval Linsey, with a [place] below who presents today for hypertension clinic follow up. Today he is feeling well overall.  Admits not taking carvedilol as prescribed for > 2 weeks d/t fear of low HR and side effects. Started taking carvedilol only 2 days ago.  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  DM2 A1c 7.3  - on Jardiance 25 mg qd, metformin 1g mbid, Ozempic 0.5 mg qw  hyperlipidemia LDL 52 on rosuvastatin 40 mg  hypothyroid TSH 1.47  On levothyroxine 137 mcg  OSA Mild, does not use CPAP     Blood Pressure Goal:  130/80  Current Medications:  losartan 50 mg daily  carvedilol 3.$RemoveBefor'125mg'[id number]$  twice daily - started only 2 days ago  Family Hx: father had MI at 35, just died recently at 81; mother doing well at 6; sister died due to suspected MI (sudden death in her sleep) at 66; 2 kids 29,30, older son with hypertension  Social Hx: no, occasional beer, coffee about 2 cups per day, no caffeine sodas  Diet: eats out more recently, he and wife both work so not home until dinner time.  Tries to avoid fried foods   Exercise: golf every week or two, working at [employer] now, would like to go back to fitness center when it opens again   Home BP readings:  07/05/20: 111/61 (no HR reported) 07/06/20: 163/113 HR 98, 168/100 HR 103, 123/85 HR 97 07/07/20: 158/105 HR 89  Intolerances: nkda  Labs: 03/2019: Na 139, K 4.4, Glu 121, BUN 20, SCr 0.94, GFR 90  Wt Readings from Last 3 Encounters:  07/13/20 205 lb (93 kg)  07/08/20 208 lb 6.4 oz (94.5 kg)  06/10/20 209 lb 6.4 oz (95 kg)   BP Readings from Last 3 Encounters:  07/13/20 134/84  07/08/20 130/88  06/10/20 (!) 140/92   Pulse Readings from Last 3 Encounters:  07/13/20 74  07/08/20 88  06/10/20 (!) 101    Current Outpatient Medications  Medication Sig Dispense Refill  . aspirin EC 81 MG tablet Take 81 mg by mouth daily. Swallow whole.    . blood glucose meter kit and  supplies KIT Dispense based on patient and insurance preference. Use up to 3 times daily as directed. (FOR ICD-10-E11.9 and Z79.4). (Patient taking differently: Inject 1 each into the skin See admin instructions. Dispense based on patient and insurance preference. Use up to 3 times daily as directed. (FOR ICD-10-E11.9 and Z79.4).) 1 each 0  . carvedilol (COREG) 3.125 MG tablet Take 1 tablet (3.125 mg total) by mouth 2 (two) times daily. 60 tablet 3  . citalopram (CELEXA) 40 MG tablet Take   1 tablet   Daily    for Mood 90 tablet 0  . Continuous Blood Gluc Receiver (FREESTYLE LIBRE 2 READER) DEVI 1 each with 6 refills. Use as directed (Patient taking differently: 1 each by Other route as directed.) 1 each 6  . Continuous Blood Gluc Sensor (FREESTYLE LIBRE 2 SENSOR) MISC Please supply one box and 6 refills. Use as directed (Patient taking differently: 1 each by Other route See admin instructions. Please supply one box and 6 refills. Use as directed) 1 each 6  . empagliflozin (JARDIANCE) 25 MG TABS tablet Take 25 mg by mouth daily.     Marland Kitchen levothyroxine (SYNTHROID) 137 MCG tablet Take   1 tablet    Daily    on an empty  stomach with only water for 30 minutes & no Antacid meds, Calcium or Magnesium for 4 hours & avoid Biotin 90 tablet 0  . losartan (COZAAR) 50 MG tablet Take 1 tablet (50 mg total) by mouth daily. 90 tablet 3  . metFORMIN (GLUCOPHAGE-XR) 500 MG 24 hr tablet Take 2 tablets 2 x  /day with Meals for Diabetes (Patient taking differently: Take 1,000 mg by mouth 2 (two) times daily with a meal.) 360 tablet 0  . omeprazole (PRILOSEC) 40 MG capsule Take  1 capsule  Daily  to Prevent Heartburn & Indigestion 90 capsule 0  . rosuvastatin (CRESTOR) 40 MG tablet Take   1 tablet   Daily    for Cholesterol 90 tablet 0  . Semaglutide,0.25 or 0.5MG /DOS, (OZEMPIC, 0.25 OR 0.5 MG/DOSE,) 2 MG/1.5ML SOPN Inject 0.5 mg into the skin once a week.    Marland Kitchen VITAMIN D PO Take 10,000 Units by mouth daily.      No current  facility-administered medications for this visit.    Not on File  Past Medical History:  Diagnosis Date  . Anxiety   . ASTHMA 10/28/2006  . Coronary artery disease   . Cough   . Diabetes mellitus type II 2000   age 28  . GERD (gastroesophageal reflux disease)   . Hyperlipidemia   . Hypertension 1995  . Hypothyroidism 2002   thyroidectomy for Goiter  . Lumbar back pain   . NASH (nonalcoholic steatohepatitis)   . Numbness   . OSA (obstructive sleep apnea) 11/19/2010  . Sleep apnea    Pt states mild case, doesn't use cpap    Blood pressure 130/88, pulse 88, resp. rate 15, height 5\' 10"  (1.778 m), weight 208 lb 6.4 oz (94.5 kg), SpO2 97 %.  Essential hypertension Started taking carvedilol 3.125mg  daily 2 days ago after elevated BP at home. Patient instructed to take carvedilol 3.125mg  daily for 1 week, then increase to twice daily (as prescribed), and follow up in 3 weeks to determine if additional titration or change needed.    Kaelin Holford Rodriguez-Guzman PharmD, BCPS, [place] [id number] 07/13/2020 3:49 PM

## 2020-07-08 NOTE — Patient Instructions (Addendum)
Return for a  follow up appointment in 3 week  Check your blood pressure at home daily (if able) and keep record of the readings.  Take your BP meds as follows: Continue taking carvedilol 3.125mg  every evening for 1 week, then increase to 3.125mg  twice daily  Bring all of your meds, your BP cuff and your record of home blood pressures to your next appointment.  Exercise as you're able, try to walk approximately 30 minutes per day.  Keep salt intake to a minimum, especially watch canned and prepared boxed foods.  Eat more fresh fruits and vegetables and fewer canned items.  Avoid eating in fast food restaurants.    HOW TO TAKE YOUR BLOOD PRESSURE: . Rest 5 minutes before taking your blood pressure. .  Don't smoke or drink caffeinated beverages for at least 30 minutes before. . Take your blood pressure before (not after) you eat. . Sit comfortably with your back supported and both feet on the floor (don't cross your legs). . Elevate your arm to heart level on a table or a desk. . Use the proper sized cuff. It should fit smoothly and snugly around your bare upper arm. There should be enough room to slip a fingertip under the cuff. The bottom edge of the cuff should be 1 inch above the crease of the elbow. . Ideally, take 3 measurements at one sitting and record the average.

## 2020-07-12 ENCOUNTER — Encounter: Payer: Self-pay | Admitting: Internal Medicine

## 2020-07-12 NOTE — Patient Instructions (Signed)

## 2020-07-12 NOTE — Progress Notes (Signed)
Annual  Screening/Preventative Visit  & Comprehensive Evaluation & Examination  Future Appointments  Date Time Provider [place]  07/13/2020  3:00 PM Unk Pinto, MD [facility] None  08/06/2020  8:00 AM [facility] PH[facility]AC[facility]  09/24/2020 11:20 AM Skeet Latch, MD [facility]  07/14/2021  2:00 PM Unk Pinto, MD [facility] None                                           This very nice 55 y.o. MWM  presents for a Screening /Preventative Visit & comprehensive evaluation and management of multiple medical co-morbidities.  Patient has been followed for HTN, ASHD /CABG, HLD, T2_NIDDM, Hypothyroidism and Vitamin D Deficiency. Patiemnt has ODSA which is states is mild & doeesn't use CPAP.      HTN predates since 30 (age 16 yo). Patient's BP has been controlled at home.  Today's BP is at goal - 134/84.  Patient has chronic biventricular Ht failure and in May 2021 underwent  7V CABG. Patient denies any cardiac symptoms as chest pain, palpitations, shortness of breath, dizziness or ankle swelling.      Patient's hyperlipidemia is controlled with diet and Rosuvastatin. Patient denies myalgias or other medication SE's. Last lipids were at goal:  Lab Results  Component Value Date   CHOL 120 03/23/2020   HDL 54 03/23/2020   LDLCALC 52 03/23/2020   LDLDIRECT 120.3 11/07/2006   TRIG 60 03/23/2020   CHOLHDL 2.2 03/23/2020       Patient has hx/o T2_NIDDM (2000) age 4 yo & currently treated with Metformin, Jardiance & Ozempic. Typically compliance has been poor.  Patient denies reactive hypoglycemic symptoms, visual blurring, diabetic polys or paresthesias. Last A1c was not at goal:   Lab Results  Component Value Date   HGBA1C 7.3 (H) 03/23/2020        Patient underwent  thyroidectomy for MNG in 2002 and has been  on thyroid Replacement since then.        Patient has hx/o Low Testosterone ("323" /2011).       Finally, patient has history of  Vitamin D Deficiency ("20" /2017 and "21" /2021)and last vitamin D was at goal:  Lab Results  Component Value Date   VD25OH 60 12/04/2019    Current Outpatient Medications on File Prior to Visit  Medication Sig  . aspirin EC 81 MG  Take daily.  . carvedilol3.125 MG  Take 1 tablet  2 times daily.  . citalopram 40 MG Take   1 tablet   Daily    for Mood  . JARDIANCE 25 MG  Take 25 mg  daily.   Marland Kitchen levothyroxine  137 MCG tablet Take   1 tablet    Daily    . losartan 50 MG tablet Take 1 tablet  daily.  . metFORMIN -XR 500 mg Take 2 tablets 2 x  /day with Meals   . omeprazole  40 MG capsule Take  1 capsule  Daily    . rosuvastatin  40 MG tablet Take   1 tablet   Daily   . OZEMPIC  0.5 mg/dose  MG/1.5ML  Inject 0.5 mg into the skin once a week.  Marland Kitchen VITAMIN D  10,000 Units  Take  daily.      Past Medical History:  Diagnosis Date  . Anxiety   . ASTHMA 10/28/2006  . Coronary artery disease   .  Cough   . Diabetes mellitus type II 2000   age 67  . GERD (gastroesophageal reflux disease)   . Hyperlipidemia   . Hypertension 1995  . Hypothyroidism 2002   thyroidectomy for Goiter  . Lumbar back pain   . NASH (nonalcoholic steatohepatitis)   . Numbness   . OSA (obstructive sleep apnea) 11/19/2010  . Sleep apnea    Pt states mild case, doesn't use cpap    Health Maintenance  Topic Date Due  . COVID-19 Vaccine (1) Never done  . OPHTHALMOLOGY EXAM  Never done  . FOOT EXAM  05/29/2020  . URINE MICROALBUMIN  05/30/2020  . HEMOGLOBIN A1C  09/21/2020  . COLONOSCOPY  09/23/2020  . INFLUENZA VACCINE  11/02/2020  . TETANUS/TDAP  08/09/2025  . PNEUMOCOCCAL POLYSACCHARIDE VACCINE AGE 47-64 HIGH RISK  Completed  . Hepatitis C Screening  Completed  . HIV Screening  Completed  . HPV VACCINES  Aged Out    Immunization History  Administered Date(s) Administered  . Influenza Inj Mdck Quad With Preservative 02/20/2017  . Influenza Split 01/16/2014  . PPD Test 10/11/2017, 05/31/2019  .  Pneumococcal Polysaccharide - 23 08/16/2012  . Tdap 08/10/2015    Last Colon - 09/24/2015 - Dr Fuller Plan - recc f/u 5 yr - due June 2022  Past Surgical History:  Procedure Laterality Date  . bilateral shoulder surgery    . CARDIAC CATHETERIZATION    . CORONARY ARTERY BYPASS GRAFT N/A 08/27/2019   Procedure: CORONARY ARTERY BYPASS GRAFTING (CABG) x7, LIMA TO LAD, SVG TO DIAG 1 WITH SEQUENTIAL SVG TO DIAG 2, SVG TO OM2, SVG TO PDA WITH SEQUENTIAL SVG TO PLB WITH SEQUENTIAL SVG TO OTHER.;  Surgeon: Gaye Pollack, MD;  Location: [facility];  Service: Open Heart Surgery;  Laterality: N/A;  . ENDOVEIN HARVEST OF GREATER SAPHENOUS VEIN Right 08/27/2019   Procedure: [place] Of Greater Saphenous Vein;  Surgeon: Gaye Pollack, MD;  Location: [facility];  Service: Open Heart Surgery;  Laterality: Right;  . LEFT HEART CATH AND CORONARY ANGIOGRAPHY N/A 08/26/2019   Procedure: LEFT HEART CATH AND CORONARY ANGIOGRAPHY;  Surgeon: Nelva Bush, MD;  Location: [facility];  Service: Cardiovascular;  Laterality: N/A;  . right knee repair  1997  . TEE WITHOUT CARDIOVERSION N/A 08/27/2019   Procedure: TRANSESOPHAGEAL ECHOCARDIOGRAM (TEE);  Surgeon: Gaye Pollack, MD;  Location: [facility];  Service: Open Heart Surgery;  Laterality: N/A;  . THYROIDECTOMY    . TRIGGER FINGER RELEASE  02/23/2011   Procedure: RELEASE TRIGGER FINGER/A-1 PULLEY;  Surgeon: Meredith Pel;  Location: [facility];  Service: Orthopedics;  Laterality: Right;  Right 4th trigger finger release    Family History  Problem Relation Age of Onset  . Osteoarthritis Father   . Heart attack Father   . Skin cancer Father   . Breast cancer Mother   . Colon cancer Neg Hx   . Esophageal cancer Neg Hx   . Rectal cancer Neg Hx   . Stomach cancer Neg Hx     Social History   Socioeconomic History  . Marital status: Married    Spouse name: Corporate treasurer  . Number of children: 2  . Years of education: 80  . Highest education level: Some college, no  degree  Occupational History  . Occupation: Barrister's clerk  Tobacco Use  . Smoking status: Never Smoker  . Smokeless tobacco: Never Used  Substance and Sexual Activity  . Alcohol use: Yes  Alcohol/week: 20 beers per week  . Drug use: Yes    Types: Marijuana  . Sexual activity: Not on file    ROS Constitutional: Denies fever, chills, weight loss/gain, headaches, insomnia,  night sweats or change in appetite. Does c/o fatigue. Eyes: Denies redness, blurred vision, diplopia, discharge, itchy or watery eyes.  ENT: Denies discharge, congestion, post nasal drip, epistaxis, sore throat, earache, hearing loss, dental pain, Tinnitus, Vertigo, Sinus pain or snoring.  Cardio: Denies chest pain, palpitations, irregular heartbeat, syncope, dyspnea, diaphoresis, orthopnea, PND, claudication or edema Respiratory: denies cough, dyspnea, DOE, pleurisy, hoarseness, laryngitis or wheezing.  Gastrointestinal: Denies dysphagia, heartburn, reflux, water brash, pain, cramps, nausea, vomiting, bloating, diarrhea, constipation, hematemesis, melena, hematochezia, jaundice or hemorrhoids Genitourinary: Denies dysuria, frequency, urgency, nocturia, hesitancy, discharge, hematuria or flank pain Musculoskeletal: Denies arthralgia, myalgia, stiffness, Jt. Swelling, pain, limp or strain/sprain. Denies Falls. Skin: Denies puritis, rash, hives, warts, acne, eczema or change in skin lesion Neuro: No weakness, tremor, incoordination, spasms, paresthesia or pain Psychiatric: Denies confusion, memory loss or sensory loss. Denies Depression. Endocrine: Denies change in weight, skin, hair change, nocturia, and paresthesia, diabetic polys, visual blurring or hyper / hypo glycemic episodes.  Heme/Lymph: No excessive bleeding, bruising or enlarged lymph nodes.  Physical Exam  BP 134/84   Pulse 74   Temp (!) 97.5 F (36.4 C) (Temporal)   Resp 16   Ht 5' 11.5" (1.816 m)   Wt 205 lb (93 kg)   SpO2 96%   BMI  28.19 kg/m   General Appearance: Well nourished and well groomed and in no apparent distress.  Eyes: PERRLA, EOMs, conjunctiva no swelling or erythema, normal fundi and vessels. Sinuses: No frontal/maxillary tenderness ENT/Mouth: EACs patent / TMs  nl. Nares clear without erythema, swelling, mucoid exudates. Oral hygiene is good. No erythema, swelling, or exudate. Tongue normal, non-obstructing. Tonsils not swollen or erythematous. Hearing normal.  Neck: Supple, thyroid not palpable. No bruits, nodes or JVD. Respiratory: Respiratory effort normal.  BS equal and clear bilateral without rales, rhonci, wheezing or stridor. Cardio: Heart sounds are normal with regular rate and rhythm and no murmurs, rubs or gallops. Peripheral pulses are normal and equal bilaterally without edema. No aortic or femoral bruits. Chest: symmetric with normal excursions and percussion.  Abdomen: Soft, with Nl bowel sounds. Nontender, no guarding, rebound, hernias, masses, or organomegaly.  Lymphatics: Non tender without lymphadenopathy.  Musculoskeletal: Full ROM all peripheral extremities, joint stability, 5/5 strength, and normal gait. Skin: Warm and dry without rashes, lesions, cyanosis, clubbing or  ecchymosis.  Neuro: Cranial nerves intact, reflexes equal bilaterally. Normal muscle tone, no cerebellar symptoms. Sensation intact.  Pysch: Alert and oriented x 3 with normal affect, insight and judgment appropriate.   Assessment and Plan  1. Annual Preventative/Screening Exam    2. Essential hypertension  - EKG 12-Lead - [place], RETROPERITNL ABD,  LTD - Urinalysis, Routine w reflex microscopic - Microalbumin / creatinine urine ratio - CBC with Differential/Platelet - COMPLETE METABOLIC PANEL WITH GFR - Magnesium - TSH  3. Hyperlipidemia associated with type 2 diabetes mellitus ([place])  - EKG 12-Lead - [place], RETROPERITNL ABD,  LTD - Lipid panel  4. Type 2 diabetes mellitus with stage 1 chronic kidney   disease, with long-term current use of insulin (HCC)  - EKG 12-Lead - [place], RETROPERITNL ABD,  LTD - Urinalysis, Routine w reflex microscopic - Microalbumin / creatinine urine ratio - HM DIABETES FOOT EXAM - LOW EXTREMITY NEUR EXAM DOCUM - Hemoglobin A1c - Insulin, random  5. Vitamin D deficiency  - VITAMIN D 25 Hydroxy   6. S/P CABG x 7  - EKG 12-Lead - Lipid panel  7. Hypothyroidism  - TSH  8. Testosterone deficiency  - Testosterone  9. OSA (obstructive sleep apnea)   10. Screening examination for pulmonary tuberculosis  - TB Skin Test  11. Screening for prostate cancer  - PSA  12. Screening for ischemic heart disease  - EKG 12-Lead  13. FHx: heart disease  - EKG 12-Lead - [place], RETROPERITNL ABD,  LTD  14. Screening for AAA (aortic abdominal aneurysm)  - [place], RETROPERITNL ABD,  LTD  15. Fatigue, unspecified type  - Iron,Total/Total Iron Binding Cap - Vitamin B12 - CBC with Differential/Platelet - TSH  16. Medication management  - Urinalysis, Routine w reflex microscopic - Microalbumin / creatinine urine ratio - CBC with Differential/Platelet - COMPLETE METABOLIC PANEL WITH GFR - Magnesium - Lipid panel - TSH - Hemoglobin A1c - Insulin, random - VITAMIN D 25 Hydroxy        Patient was counseled in prudent diet, weight control to achieve/maintain BMI less than 25, BP monitoring, regular exercise and medications as discussed.  Discussed med effects and SE's. Routine screening labs and tests as requested with regular follow-up as recommended. Over 40 minutes of exam, counseling, chart review and high complex critical decision making was performed   Kirtland Bouchard, MD

## 2020-07-13 ENCOUNTER — Encounter: Payer: Self-pay | Admitting: Pharmacist

## 2020-07-13 ENCOUNTER — Encounter: Payer: Self-pay | Admitting: Internal Medicine

## 2020-07-13 ENCOUNTER — Ambulatory Visit (INDEPENDENT_AMBULATORY_CARE_PROVIDER_SITE_OTHER): Payer: Managed Care, Other (non HMO) | Admitting: Internal Medicine

## 2020-07-13 ENCOUNTER — Other Ambulatory Visit: Payer: Self-pay

## 2020-07-13 VITALS — BP 134/84 | HR 74 | Temp 97.5°F | Resp 16 | Ht 71.5 in | Wt 205.0 lb

## 2020-07-13 DIAGNOSIS — N401 Enlarged prostate with lower urinary tract symptoms: Secondary | ICD-10-CM

## 2020-07-13 DIAGNOSIS — Z130 Encounter for screening for diseases of the blood and blood-forming organs and certain disorders involving the immune mechanism: Secondary | ICD-10-CM | POA: Diagnosis not present

## 2020-07-13 DIAGNOSIS — Z0001 Encounter for general adult medical examination with abnormal findings: Secondary | ICD-10-CM

## 2020-07-13 DIAGNOSIS — Z79899 Other long term (current) drug therapy: Secondary | ICD-10-CM | POA: Diagnosis not present

## 2020-07-13 DIAGNOSIS — Z8249 Family history of ischemic heart disease and other diseases of the circulatory system: Secondary | ICD-10-CM

## 2020-07-13 DIAGNOSIS — R5383 Other fatigue: Secondary | ICD-10-CM

## 2020-07-13 DIAGNOSIS — R350 Frequency of micturition: Secondary | ICD-10-CM

## 2020-07-13 DIAGNOSIS — Z1389 Encounter for screening for other disorder: Secondary | ICD-10-CM

## 2020-07-13 DIAGNOSIS — Z1329 Encounter for screening for other suspected endocrine disorder: Secondary | ICD-10-CM | POA: Diagnosis not present

## 2020-07-13 DIAGNOSIS — Z0000 Encounter for general adult medical examination without abnormal findings: Secondary | ICD-10-CM

## 2020-07-13 DIAGNOSIS — Z125 Encounter for screening for malignant neoplasm of prostate: Secondary | ICD-10-CM | POA: Diagnosis not present

## 2020-07-13 DIAGNOSIS — G4733 Obstructive sleep apnea (adult) (pediatric): Secondary | ICD-10-CM

## 2020-07-13 DIAGNOSIS — Z13220 Encounter for screening for lipoid disorders: Secondary | ICD-10-CM

## 2020-07-13 DIAGNOSIS — E559 Vitamin D deficiency, unspecified: Secondary | ICD-10-CM

## 2020-07-13 DIAGNOSIS — Z111 Encounter for screening for respiratory tuberculosis: Secondary | ICD-10-CM

## 2020-07-13 DIAGNOSIS — Z951 Presence of aortocoronary bypass graft: Secondary | ICD-10-CM

## 2020-07-13 DIAGNOSIS — E349 Endocrine disorder, unspecified: Secondary | ICD-10-CM

## 2020-07-13 DIAGNOSIS — I10 Essential (primary) hypertension: Secondary | ICD-10-CM

## 2020-07-13 DIAGNOSIS — E1122 Type 2 diabetes mellitus with diabetic chronic kidney disease: Secondary | ICD-10-CM

## 2020-07-13 DIAGNOSIS — Z131 Encounter for screening for diabetes mellitus: Secondary | ICD-10-CM

## 2020-07-13 DIAGNOSIS — Z136 Encounter for screening for cardiovascular disorders: Secondary | ICD-10-CM | POA: Diagnosis not present

## 2020-07-13 DIAGNOSIS — E1169 Type 2 diabetes mellitus with other specified complication: Secondary | ICD-10-CM

## 2020-07-13 DIAGNOSIS — E039 Hypothyroidism, unspecified: Secondary | ICD-10-CM

## 2020-07-13 NOTE — Assessment & Plan Note (Signed)
Started taking carvedilol 3.125mg  daily 2 days ago after elevated BP at home. Patient instructed to take carvedilol 3.125mg  daily for 1 week, then increase to twice daily (as prescribed), and follow up in 3 weeks to determine if additional titration or change needed.

## 2020-07-13 NOTE — Progress Notes (Signed)
AortaScan < 3 cm. Within normal limits, per Dr McKeown. 

## 2020-07-14 LAB — CBC WITH DIFFERENTIAL/PLATELET
Absolute Monocytes: 592 cells/uL (ref 200–950)
Basophils Absolute: 64 cells/uL (ref 0–200)
Basophils Relative: 0.8 %
Eosinophils Absolute: 192 cells/uL (ref 15–500)
Eosinophils Relative: 2.4 %
HCT: 48.6 % (ref 38.5–50.0)
Hemoglobin: 17 g/dL (ref 13.2–17.1)
Lymphs Abs: 2496 cells/uL (ref 850–3900)
MCH: 32.5 pg (ref 27.0–33.0)
MCHC: 35 g/dL (ref 32.0–36.0)
MCV: 92.9 fL (ref 80.0–100.0)
MPV: 10.3 fL (ref 7.5–12.5)
Monocytes Relative: 7.4 %
Neutro Abs: 4656 cells/uL (ref 1500–7800)
Neutrophils Relative %: 58.2 %
Platelets: 217 10*3/uL (ref 140–400)
RBC: 5.23 10*6/uL (ref 4.20–5.80)
RDW: 12.4 % (ref 11.0–15.0)
Total Lymphocyte: 31.2 %
WBC: 8 10*3/uL (ref 3.8–10.8)

## 2020-07-14 LAB — LIPID PANEL
Cholesterol: 148 mg/dL (ref <200)
HDL: 49 mg/dL (ref >=40)
LDL Cholesterol (Calc): 71 mg/dL (calc)
Non-HDL Cholesterol (Calc): 99 mg/dL (calc) (ref <130)
Total CHOL/HDL Ratio: 3 (calc) (ref <5.0)
Triglycerides: 215 mg/dL — ABNORMAL HIGH (ref <150)

## 2020-07-14 LAB — URINALYSIS, ROUTINE W REFLEX MICROSCOPIC
Bilirubin Urine: NEGATIVE
Hgb urine dipstick: NEGATIVE
Leukocytes,Ua: NEGATIVE
Nitrite: NEGATIVE
Protein, ur: NEGATIVE
Specific Gravity, Urine: 1.039 — ABNORMAL HIGH (ref 1.001–1.03)
pH: 5.5 (ref 5.0–8.0)

## 2020-07-14 LAB — TSH: TSH: 4.54 mIU/L — ABNORMAL HIGH (ref 0.40–4.50)

## 2020-07-14 LAB — COMPLETE METABOLIC PANEL WITH GFR
AG Ratio: 1.6 (calc) (ref 1.0–2.5)
ALT: 34 U/L (ref 9–46)
AST: 27 U/L (ref 10–35)
Albumin: 5 g/dL (ref 3.6–5.1)
Alkaline phosphatase (APISO): 61 U/L (ref 35–144)
BUN/Creatinine Ratio: 29 (calc) — ABNORMAL HIGH (ref 6–22)
BUN: 28 mg/dL — ABNORMAL HIGH (ref 7–25)
CO2: 23 mmol/L (ref 20–32)
Calcium: 10.3 mg/dL (ref 8.6–10.3)
Chloride: 100 mmol/L (ref 98–110)
Creat: 0.97 mg/dL (ref 0.70–1.33)
GFR, Est African American: 100 mL/min/{1.73_m2} (ref >=60)
GFR, Est Non African American: 86 mL/min/{1.73_m2} (ref >=60)
Globulin: 3.1 g/dL (calc) (ref 1.9–3.7)
Glucose, Bld: 146 mg/dL — ABNORMAL HIGH (ref 65–99)
Potassium: 4.4 mmol/L (ref 3.5–5.3)
Sodium: 137 mmol/L (ref 135–146)
Total Bilirubin: 0.8 mg/dL (ref 0.2–1.2)
Total Protein: 8.1 g/dL (ref 6.1–8.1)

## 2020-07-14 LAB — INSULIN, RANDOM: Insulin: 23.8 u[IU]/mL — ABNORMAL HIGH

## 2020-07-14 LAB — MICROALBUMIN / CREATININE URINE RATIO
Creatinine, Urine: 74 mg/dL (ref 20–320)
Microalb Creat Ratio: 81 mcg/mg creat — ABNORMAL HIGH (ref <30)
Microalb, Ur: 6 mg/dL

## 2020-07-14 LAB — IRON, TOTAL/TOTAL IRON BINDING CAP
%SAT: 24 % (calc) (ref 20–48)
Iron: 94 ug/dL (ref 50–180)
TIBC: 386 mcg/dL (calc) (ref 250–425)

## 2020-07-14 LAB — HEMOGLOBIN A1C
Hgb A1c MFr Bld: 7.1 % of total Hgb — ABNORMAL HIGH (ref <5.7)
Mean Plasma Glucose: 157 mg/dL
eAG (mmol/L): 8.7 mmol/L

## 2020-07-14 LAB — PSA: PSA: 0.54 ng/mL (ref <=4.0)

## 2020-07-14 LAB — VITAMIN D 25 HYDROXY (VIT D DEFICIENCY, FRACTURES): Vit D, 25-Hydroxy: 76 ng/mL (ref 30–100)

## 2020-07-14 LAB — MAGNESIUM: Magnesium: 2.2 mg/dL (ref 1.5–2.5)

## 2020-07-14 LAB — VITAMIN B12: Vitamin B-12: 430 pg/mL (ref 200–1100)

## 2020-07-14 LAB — TESTOSTERONE: Testosterone: 538 ng/dL (ref 250–827)

## 2020-07-14 NOTE — Progress Notes (Signed)
============================================================ ============================================================  -   -  Vitamin B12 = 430     Low  (Ideal or Goal Vit B12 is between 450 - 1,100)   Low Vit B12 may be associated with Anemia , Fatigue,   Peripheral Neuropathy, Dementia, "Brain Fog", & Depression  - Recommend take a sub-lingual form of Vitamin B12 tablet   1,000 to 5,000 mcg tab that you dissolve under your tongue /Daily   - Can get Baron Sane - best price at [employer] or on [employer] ============================================================ ============================================================  -  PSA - Low -Great ! ============================================================ ============================================================  -  Total Chol = 148 and LDL Chol  = 71  - Both  Excellent   - Very low risk for Heart Attack  / Stroke ============================================================ ============================================================  - Triglycerides (   215   ) or fats in blood are too high  (goal is less than 150)    - Recommend avoid fried & greasy foods,  sweets / candy,   - Avoid white rice  (brown or wild rice or Quinoa is OK),   - Avoid white potatoes  (sweet potatoes are OK)   - Avoid anything made from white flour  - bagels, doughnuts, rolls, buns, biscuits, white and   wheat breads, pizza crust and traditional  pasta made of white flour & egg white  - (vegetarian pasta or spinach or wheat pasta is OK).    - Multi-grain bread is OK - like multi-grain flat bread or  sandwich thins.   - Avoid alcohol in excess.   - Exercise is also important. ============================================================ ============================================================  -  Thyroid is borderline, but not enough to suggest changing dose   - Be sure to take  your thyroid  on an empty stomach with only water  for  30 minutes &  no Antacid meds, Calcium or Magnesium for 4 hours & avoid Biotin  ============================================================ ============================================================  -  A1c = 7.1% -   Ideal or Goal is less than 6.0%  -   Being diabetic has a  300% increased risk for heart attack,  stroke, cancer, and alzheimer- type vascular dementia.   It is very important that you work harder with diet by  avoiding all foods that are white except chicken,   fish & calliflower.  - Avoid white rice  (brown & wild rice is OK),   - Avoid white potatoes  (sweet potatoes in moderation is OK),   White bread or wheat bread or anything made out of   white flour like bagels, donuts, rolls, buns, biscuits, cakes,  - pastries, cookies, pizza crust, and pasta (made from  white flour & egg whites)   - vegetarian pasta or spinach or wheat pasta is OK.  - Multigrain breads like Arnold's, Pepperidge Farm or   multigrain sandwich thins or high fiber breads like   [place] bread or "Dave's Killer" breads that are  4 to 5 grams fiber per slice !  are best.    Diet, exercise and weight loss can reverse and cure  diabetes in the early stages.    - Diet, exercise and weight loss is very important in the   control and prevention of complications of diabetes which  affects every system in your body, ie.   -Brain - dementia/stroke,  - eyes - glaucoma/blindness,  - heart - heart attack/heart failure,  - kidneys - dialysis,  - stomach - gastric paralysis,  - intestines - malabsorption,  - nerves - severe painful  neuritis,  - circulation - gangrene & loss of a leg(s)  - and finally  . . . . . . . . . . . . . . . . . .    - cancer and Alzheimers. ============================================================ ============================================================  -  Vitamin D = 76 - Excellent   ============================================================ ============================================================  -  All Else - CBC - Kidneys - Electrolytes - Liver - Magnesium & Thyroid    - all  Normal / OK ============================================================ ============================================================

## 2020-07-20 ENCOUNTER — Other Ambulatory Visit: Payer: Self-pay | Admitting: Internal Medicine

## 2020-08-06 ENCOUNTER — Other Ambulatory Visit: Payer: Self-pay

## 2020-08-06 ENCOUNTER — Ambulatory Visit: Payer: Managed Care, Other (non HMO) | Admitting: Pharmacist

## 2020-08-06 VITALS — BP 110/74 | HR 97 | Resp 17 | Ht 70.0 in | Wt 208.0 lb

## 2020-08-06 DIAGNOSIS — I10 Essential (primary) hypertension: Secondary | ICD-10-CM

## 2020-08-06 NOTE — Progress Notes (Signed)
Patient ID: [name] [name]                 DOB: 02/20/1966                      MRN: [id number]     HPI: [name] [name] is a 55 y.o. male referred by Dr. Oval Linsey to ADV HTN clinic. PMH is significant for NSTEMI, DM, hyperlipidemia and OSA.  This is patient's third PharmD visit in Adv HTN clinic.  At last visit patient had only been on carvedilol 3.163m for 2 days and only once a day.  Metoprolol had previously made patient hypotensive and dizzy so he has been nervous to take carvedilol as directed.  Patient has been taking home BP daily.  Readings varied but most at goal.  Pulse rate has been elevated.  Had PCP appointment earlier this week and A1c is now at goal.  Retired from [employer] he was physically inactive and ate fast food.  Now works at [employer] [place] is much more active.  Reports no side effects to losartan or carvedilol.  Home BP/pulse readings: 4/22: 127/81, 98 4/21: 139/95, 96 4/20: 128/86, 97 4/19: 142/100, 99 4/16: 133/89, 93 4/15: 121/81, 106 4/14: 70/42, 84 4/14: 114/66, 85 4/10: 124/86, 93  Patient did not report which readings were in AM or PM and he can not recall.  Current HTN meds: losartan 531mdaily, carvedilol 3.12525mID (patient only taking once daily)  Previously tried: HCTZ 25, losartan/hctz 100/25 BP goal: <130/80   Wt Readings from Last 3 Encounters:  07/13/20 205 lb (93 kg)  07/08/20 208 lb 6.4 oz (94.5 kg)  06/10/20 209 lb 6.4 oz (95 kg)   BP Readings from Last 3 Encounters:  07/13/20 134/84  07/08/20 130/88  06/10/20 (!) 140/92   Pulse Readings from Last 3 Encounters:  07/13/20 74  07/08/20 88  06/10/20 (!) 101    Renal function: CrCl cannot be calculated (Patient's most recent lab result is older than the maximum 21 days allowed.).  Past Medical History:  Diagnosis Date  . Anxiety   . ASTHMA 10/28/2006  . Coronary artery disease   . Cough   . Diabetes mellitus type II 2000    age [age] GERD (gastroesophageal reflux disease)   . Hyperlipidemia   . Hypertension 1995  . Hypothyroidism 2002   thyroidectomy for Goiter  . Lumbar back pain   . NASH (nonalcoholic steatohepatitis)   . Numbness   . OSA (obstructive sleep apnea) 11/19/2010  . Sleep apnea    Pt states mild case, doesn't use cpap    Current Outpatient Medications on File Prior to Visit  Medication Sig Dispense Refill  . aspirin EC 81 MG tablet Take 81 mg by mouth daily. Swallow whole.    . blood glucose meter kit and supplies KIT Dispense based on patient and insurance preference. Use up to 3 times daily as directed. (FOR ICD-10-E11.9 and Z79.4). (Patient taking differently: Inject 1 each into the skin See admin instructions. Dispense based on patient and insurance preference. Use up to 3 times daily as directed. (FOR ICD-10-E11.9 and Z79.4).) 1 each 0  . carvedilol (COREG) 3.125 MG tablet Take 1 tablet (3.125 mg total) by mouth 2 (two) times daily. 60 tablet 3  . citalopram (CELEXA) 40 MG tablet TAKE 1 TABLET ONCE DAILY. 30 tablet 2  . Continuous Blood Gluc Receiver (FREESTYLE LIBRE 2 READER) DEVI 1  each with 6 refills. Use as directed (Patient taking differently: 1 each by Other route as directed.) 1 each 6  . Continuous Blood Gluc Sensor (FREESTYLE LIBRE 2 SENSOR) MISC Please supply one box and 6 refills. Use as directed (Patient taking differently: 1 each by Other route See admin instructions. Please supply one box and 6 refills. Use as directed) 1 each 6  . empagliflozin (JARDIANCE) 25 MG TABS tablet Take 25 mg by mouth daily.     Marland Kitchen levothyroxine (SYNTHROID) 137 MCG tablet 1 TAB ON EMPTY STOMACH ONLY WATER FOR 30 MINS.NO ANTACIDS, CALC, MAGN, BIOTIN FOR 4 HRS 30 tablet 2  . losartan (COZAAR) 50 MG tablet Take 1 tablet (50 mg total) by mouth daily. 90 tablet 3  . metFORMIN (GLUCOPHAGE-XR) 500 MG 24 hr tablet Take 2 tablets 2 x  /day with Meals for Diabetes (Patient taking differently: Take 1,000 mg by  mouth 2 (two) times daily with a meal.) 360 tablet 0  . omeprazole (PRILOSEC) 40 MG capsule Take  1 capsule  Daily  to Prevent Heartburn & Indigestion 90 capsule 0  . rosuvastatin (CRESTOR) 40 MG tablet TAKE 1 TABLET ONCE DAILY. 30 tablet 2  . Semaglutide,0.25 or 0.5MG/DOS, (OZEMPIC, 0.25 OR 0.5 MG/DOSE,) 2 MG/1.5ML SOPN Inject 0.5 mg into the skin once a week.    Marland Kitchen VITAMIN D PO Take 10,000 Units by mouth daily.      No current facility-administered medications on file prior to visit.    Not on File   Assessment/Plan:  1. Hypertension - Patient BP in room today 110/74 which is at goal of <130/80.  Pulse rate frequently in upper 90s and 100s.  Blood pressure variable however it is unclear at which time of day patient was taking his readings and he had only been taking carvedilol in the morning,  Encouraged patient to begin taking his carvedilol twice day as prescribed and to continue to monitor BP and pulse.  Continue watching salt content and physical activity at [employer].  Patient instructed to call clinic if BP or pulse rate drop too low.  Patient voiced understanding,  Next visit scheduled with Dr Oval Linsey in June.  Continue losartan 50 mg daily Begin carvedilol 3.176m BID Recheck with Dr ROval Linseyin 1 month  CKarren Cobble PharmD, BCACP, [id number] [place] [address]Phone: [phone] Fax: [phone]/08/2020 8:51 AM

## 2020-08-06 NOTE — Patient Instructions (Addendum)
It was nice meeting you today!  We would like your blood pressure to be less than 130/80  Continue your losartan 50mg  once a day Increase your carvedilol to 3.125mg  twice a day   Continue to monitor your blood pressure in the morning and evening and let us know if your blood pressure or pulse rate drops too low  Watch your sodium intake and continue being physically active at least 30 minutes a day at least 5 days a week  Please call with any questions!  Karren Cobble, PharmD, BCACP, [place], [place] [address], [place], [place] [address] Phone: [phone]; Fax: [phone] 08/06/2020 8:27 AM

## 2020-08-28 ENCOUNTER — Telehealth: Payer: Self-pay | Admitting: *Deleted

## 2020-08-28 NOTE — Telephone Encounter (Signed)
Left message to inform him [name] [name] denied by his insurance, since he is no longer on insulin theray, on Ozempic.

## 2020-09-24 ENCOUNTER — Ambulatory Visit: Payer: Managed Care, Other (non HMO) | Admitting: Cardiovascular Disease

## 2020-09-24 ENCOUNTER — Other Ambulatory Visit: Payer: Self-pay

## 2020-09-24 ENCOUNTER — Encounter: Payer: Self-pay | Admitting: Cardiovascular Disease

## 2020-09-24 VITALS — BP 144/92 | HR 84 | Ht 70.0 in | Wt 205.6 lb

## 2020-09-24 DIAGNOSIS — G4733 Obstructive sleep apnea (adult) (pediatric): Secondary | ICD-10-CM

## 2020-09-24 DIAGNOSIS — I5042 Chronic combined systolic (congestive) and diastolic (congestive) heart failure: Secondary | ICD-10-CM

## 2020-09-24 DIAGNOSIS — E782 Mixed hyperlipidemia: Secondary | ICD-10-CM

## 2020-09-24 DIAGNOSIS — I10 Essential (primary) hypertension: Secondary | ICD-10-CM | POA: Diagnosis not present

## 2020-09-24 DIAGNOSIS — I25110 Atherosclerotic heart disease of native coronary artery with unstable angina pectoris: Secondary | ICD-10-CM | POA: Diagnosis not present

## 2020-09-24 DIAGNOSIS — Z5181 Encounter for therapeutic drug level monitoring: Secondary | ICD-10-CM

## 2020-09-24 DIAGNOSIS — I7121 Aneurysm of the ascending aorta, without rupture: Secondary | ICD-10-CM

## 2020-09-24 DIAGNOSIS — I712 Thoracic aortic aneurysm, without rupture: Secondary | ICD-10-CM | POA: Insufficient documentation

## 2020-09-24 HISTORY — DX: Thoracic aortic aneurysm, without rupture: I71.2

## 2020-09-24 HISTORY — DX: Chronic combined systolic (congestive) and diastolic (congestive) heart failure: I50.42

## 2020-09-24 HISTORY — DX: Aneurysm of the ascending aorta, without rupture: I71.21

## 2020-09-24 MED ORDER — LOSARTAN POTASSIUM 100 MG PO TABS: 100.0000 mg | ORAL_TABLET | Freq: Every day | ORAL | 3 refills | Status: DC

## 2020-09-24 NOTE — Assessment & Plan Note (Signed)
LVEF 40 to 45% prior to revascularization.  Repeat echo 12/2019 revealed improvement to 55 to 60%.  He is euvolemic and doing well.  Continue carvedilol.  Continue Jardiance.

## 2020-09-24 NOTE — Assessment & Plan Note (Signed)
LDL is nearly at goal but triglycerides are elevated.  Continue rosuvastatin.  He is going to work on increasing his exercise and limiting alcohol intake.  Repeat lipids and a CMP when he follows up in 6 months.

## 2020-09-24 NOTE — Assessment & Plan Note (Signed)
S/p CABG 08/2019 (LIMA--> LAD, SVG-->D1/D2, SVG-->PDA, PL1, PL2).  He is doing well and has no symptoms.  Continue carvedilol, aspirin, and rosuvastatin.

## 2020-09-24 NOTE — Patient Instructions (Signed)
Medication Instructions:  INCREASE LOSARTAN TO 100 MG DAILY   *If you need a refill on your cardiac medications before your next appointment, please call your pharmacy*  Lab Work: BMET IN 1 WEEK   If you have labs (blood work) drawn today and your tests are completely normal, you will receive your results only by: [place] (if you have MyChart) OR A paper copy in the mail If you have any lab test that is abnormal or we need to change your treatment, we will call you to review the results.  Testing/Procedures: NONE  Follow-Up: At [facility], you and your health needs are our priority.  As part of our continuing mission to provide you with exceptional heart care, we have created designated Provider Care Teams.  These Care Teams include your primary Cardiologist (physician) and Advanced Practice Providers (APPs -  Physician Assistants and Nurse Practitioners) who all work together to provide you with the care you need, when you need it.  We recommend signing up for the patient portal called "MyChart".  Sign up information is provided on this After Visit Summary.  MyChart is used to connect with patients for Virtual Visits (Telemedicine).  Patients are able to view lab/test results, encounter notes, upcoming appointments, etc.  Non-urgent messages can be sent to your provider as well.   To learn more about what you can do with MyChart, go to [email or web address].    Your next appointment:   6 month(s)  The format for your next appointment:   In Person  Provider:   DR [facility] [place]   Your physician recommends that you schedule a follow-up appointment in: [place] [facility]

## 2020-09-24 NOTE — Assessment & Plan Note (Signed)
4.0 cm on echo in 2021.  Will repeat on follow-up.  Continue carvedilol and BP control.

## 2020-09-24 NOTE — Assessment & Plan Note (Signed)
Continue CPAP.  

## 2020-09-24 NOTE — Assessment & Plan Note (Signed)
Blood pressure was poorly controlled both initially and on repeat.  He notes that he had several coffees today.  He is going to check his blood pressures at home.  We will increase losartan to 100 mg daily.  Check basic metabolic panel in a week.

## 2020-09-24 NOTE — Progress Notes (Signed)
Cardiology Office Note   Date:  09/24/2020   ID:  [name], [name] 03/27/1966, MRN [id number]  PCP:  Unk Pinto, MD  Cardiologist:  Skeet Latch, MD  Electrophysiologist:  None   Evaluation Performed:  Follow-Up Visit  Chief Complaint:  hypertension  History of Present Illness:    [name] [name] is a 55 y.o. male with chronic systolic diastolic heart failure, CAD status post CABG 08/2019, mild ascending aorta aneurysm, diabetes, CKD, hypothyroidism, GERD, anxiety, OSA, and obesity who presents for follow-up.  He was initially seen in the hospital 08/2019 with chest pain in the setting of NSTEMI.  He presented to [place] and was transferred to [facility].  High-sensitivity troponin was over 13,000.  He underwent left heart catheterization 08/26/2019 that revealed diffuse three-vessel disease, including 90% left main disease.  He subsequently underwent CABG (LIMA--> LAD, SVG-->D1/D2, SVG-->PDA, PL1, PL2) with Dr. Cyndia Bent.  Echo that admission revealed LVEF 40 to 45% with grade 2 diastolic dysfunction.  LVEF improved to 60-65% on echo 9/021.  He was seen in the hospital 09/2019 with chest pain and dizziness.  He was hypotensive to the 62X systolic.  He elected not to participate in cardiac rehab.  He is back working at the [employer].   At his appointment 03/2020 Losartan was added. He was encouraged to increase his exercise. He followed up with our pharmacist, and was started on Carvedilol 3.125 mg given that he had not tolerated beta-blockers in the past.  Today, he is feeling good overall. His main concern today is that he feels some right LE numbness and wonders if it is due to his previous vein harvest 08/27/2019. At home he has not monitored his blood pressure due to misplacing his cuff. He does not formally exercise as much as he would like. 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 Occasionally, he drinks alcohol. This morning he had two cups  of coffee. Sometimes he mixes an energy shot into his coffee. Of note, at times he will forget to take his second dose of Carvedilol at night. He denies any chest pain, shortness of breath, palpitations, or exertional symptoms. No headaches, lightheadedness, or syncope to report. Also has no lower extremity edema, orthopnea or PND.   Past Medical History:  Diagnosis Date   Aneurysm of ascending aorta ([place]) 09/24/2020   4.0 cm on echo in 2021.  Will repeat on follow-up.   Anxiety    ASTHMA 10/28/2006   Chronic combined systolic and diastolic heart failure ([place]) 09/24/2020   Coronary artery disease    Cough    Diabetes mellitus type II 2000   age 60   GERD (gastroesophageal reflux disease)    Hyperlipidemia    Hypertension 1995   Hypothyroidism 2002   thyroidectomy for Goiter   Lumbar back pain    NASH (nonalcoholic steatohepatitis)    Numbness    OSA (obstructive sleep apnea) 11/19/2010   Sleep apnea    Pt states mild case, doesn't use cpap    Past Surgical History:  Procedure Laterality Date   bilateral shoulder surgery     CARDIAC CATHETERIZATION     CORONARY ARTERY BYPASS GRAFT N/A 08/27/2019   Procedure: CORONARY ARTERY BYPASS GRAFTING (CABG) x7, LIMA TO LAD, SVG TO DIAG 1 WITH SEQUENTIAL SVG TO DIAG 2, SVG TO OM2, SVG TO PDA WITH SEQUENTIAL SVG TO PLB WITH SEQUENTIAL SVG TO OTHER.;  Surgeon: Gaye Pollack, MD;  Location: [facility];  Service: Open Heart Surgery;  Laterality: N/A;   ENDOVEIN HARVEST OF GREATER SAPHENOUS VEIN Right 08/27/2019   Procedure: [place] Of Greater Saphenous Vein;  Surgeon: Gaye Pollack, MD;  Location: [facility];  Service: Open Heart Surgery;  Laterality: Right;   LEFT HEART CATH AND CORONARY ANGIOGRAPHY N/A 08/26/2019   Procedure: LEFT HEART CATH AND CORONARY ANGIOGRAPHY;  Surgeon: Nelva Bush, MD;  Location: [facility];  Service: Cardiovascular;  Laterality: N/A;   right knee repair  1997   TEE WITHOUT CARDIOVERSION N/A 08/27/2019    Procedure: TRANSESOPHAGEAL ECHOCARDIOGRAM (TEE);  Surgeon: Gaye Pollack, MD;  Location: [facility];  Service: Open Heart Surgery;  Laterality: N/A;   THYROIDECTOMY     right   TRIGGER FINGER RELEASE     TRIGGER FINGER RELEASE  02/23/2011   Procedure: RELEASE TRIGGER FINGER/A-1 PULLEY;  Surgeon: Meredith Pel;  Location: [facility];  Service: Orthopedics;  Laterality: Right;  Right 4th trigger finger release     Current Outpatient Medications  Medication Sig Dispense Refill   aspirin EC 81 MG tablet Take 81 mg by mouth daily. Swallow whole.     blood glucose meter kit and supplies KIT Dispense based on patient and insurance preference. Use up to 3 times daily as directed. (FOR ICD-10-E11.9 and Z79.4). (Patient taking differently: Inject 1 each into the skin See admin instructions. Dispense based on patient and insurance preference. Use up to 3 times daily as directed. (FOR ICD-10-E11.9 and Z79.4).) 1 each 0   carvedilol (COREG) 3.125 MG tablet Take 1 tablet (3.125 mg total) by mouth 2 (two) times daily. 60 tablet 3   citalopram (CELEXA) 40 MG tablet TAKE 1 TABLET ONCE DAILY. 30 tablet 2   Continuous Blood Gluc Receiver (FREESTYLE LIBRE 2 READER) DEVI 1 each with 6 refills. Use as directed (Patient taking differently: 1 each by Other route as directed.) 1 each 6   Continuous Blood Gluc Sensor (FREESTYLE LIBRE 2 SENSOR) MISC Please supply one box and 6 refills. Use as directed (Patient taking differently: 1 each by Other route See admin instructions. Please supply one box and 6 refills. Use as directed) 1 each 6   empagliflozin (JARDIANCE) 25 MG TABS tablet Take 1 tablet by mouth daily.     levothyroxine (SYNTHROID) 137 MCG tablet 1 TAB ON EMPTY STOMACH ONLY WATER FOR 30 MINS.NO ANTACIDS, CALC, MAGN, BIOTIN FOR 4 HRS 30 tablet 2   losartan (COZAAR) 100 MG tablet Take 1 tablet (100 mg total) by mouth daily. 90 tablet 3   metFORMIN (GLUCOPHAGE-XR) 500 MG 24 hr tablet Take 2 tablets 2 x  /day with Meals  for Diabetes (Patient taking differently: Take 1,000 mg by mouth 2 (two) times daily with a meal.) 360 tablet 0   omeprazole (PRILOSEC) 40 MG capsule Take  1 capsule  Daily  to Prevent Heartburn & Indigestion 90 capsule 0   rosuvastatin (CRESTOR) 40 MG tablet TAKE 1 TABLET ONCE DAILY. 30 tablet 2   Semaglutide,0.25 or 0.5MG /DOS, (OZEMPIC, 0.25 OR 0.5 MG/DOSE,) 2 MG/1.5ML SOPN Inject 0.5 mg into the skin once a week.     VITAMIN D PO Take 10,000 Units by mouth daily.      No current facility-administered medications for this visit.    Allergies:   Patient has no allergy information on record.    Social History:  The patient  reports that he has never smoked. He has never used smokeless tobacco. He reports current alcohol use. He reports  current drug use. Drug: Marijuana.   Family History:  The patient's family history includes Breast cancer in his mother; Heart attack in his father; Osteoarthritis in his father; Skin cancer in his father.    ROS:   Please see the history of present illness. (+) Right LE numbness All other systems are reviewed and negative.    PHYSICAL EXAM: VS:  BP (!) 144/92 (BP Location: Right Arm, Patient Position: Sitting)   Pulse 84   Ht $R'5\' 10"'uM$  (1.778 m)   Wt 205 lb 9.6 oz (93.3 kg)   SpO2 98%   BMI 29.50 kg/m  , BMI Body mass index is 29.5 kg/m. GENERAL:  Well appearing HEENT:  Pupils equal round and reactive, fundi not visualized, oral mucosa unremarkable NECK:  No jugular venous distention, waveform within normal limits, carotid upstroke brisk and symmetric, no bruits LUNGS:  Clear to auscultation bilaterally HEART:  RRR.  PMI not displaced or sustained,S1 and S2 within normal limits, no S3, no S4, no clicks, no rubs, no murmurs ABD:  Flat, positive bowel sounds normal in frequency in pitch, no bruits, no rebound, no guarding, no midline pulsatile mass, no hepatomegaly, no splenomegaly EXT:  2 plus pulses throughout, no edema, no cyanosis no  clubbing SKIN:  No rashes no nodules NEURO:  Cranial nerves II through XII grossly intact, motor grossly intact throughout PSYCH:  Cognitively intact, oriented to person place and time   EKG:   09/24/2020: EKG is not ordered today. 03/23/2020: EKG was not ordered. 02/10/2020: sinus rhythm.  Rate 88 bpm.    Echo 08/26/19:  1. Left ventricular ejection fraction, by estimation, is 40 to 45%. The  left ventricle has mildly decreased function. The left ventricle  demonstrates regional wall motion abnormalities (see scoring  diagram/findings for description). Left ventricular  diastolic parameters are consistent with Grade II diastolic dysfunction  (pseudonormalization). There is moderate hypokinesis of the left  ventricular, mid-apical anteroseptal wall.   2. Right ventricular systolic function is normal. The right ventricular  size is normal.   3. The mitral valve is normal in structure. Mild mitral valve  regurgitation. No evidence of mitral stenosis.   4. The aortic valve is normal in structure. Aortic valve regurgitation is  not visualized. No aortic stenosis is present.   Echo 12/2019:    1. Left ventricular ejection fraction, by estimation, is 55 to 60%. The  left ventricle has normal function. The left ventricle has no regional  wall motion abnormalities. Left ventricular diastolic parameters are  consistent with Grade I diastolic  dysfunction (impaired relaxation).   2. Right ventricular systolic function is mildly reduced. The right  ventricular size is normal. Tricuspid regurgitation signal is inadequate  for assessing PA pressure.   3. The mitral valve is normal in structure. Trivial mitral valve  regurgitation. No evidence of mitral stenosis.   4. The aortic valve is tricuspid. Aortic valve regurgitation is not  visualized. No aortic stenosis is present.   5. Aortic dilatation noted. There is mild dilatation of the aortic root,  measuring 40 mm.   6. The inferior vena  cava is normal in size with greater than 50%  respiratory variability, suggesting right atrial pressure of 3 mmHg.    LHC 08/26/19: Diagnostic Dominance: Right    Conclusions: Severe multivessel coronary artery disease, including 90% distal LMCA stenosis, as well as 70-80% lesions involving the mid LAD, D1, large OM1, and rPL branches. Mildly to moderately reduced left ventricular contraction (LVEF ~45%) with anterior  hypokinesis.  Upper normal left ventricular filling pressure.   Carotid Doppler 08/26/19: Right Carotid: Velocities in the right ICA are consistent with a 40-59%                 stenosis.   Left Carotid: Velocities in the left ICA are consistent with a 1-39%  stenosis.  Vertebrals:  Bilateral vertebral arteries demonstrate antegrade flow.  Subclavians: Normal flow hemodynamics were seen in bilateral subclavian               arteries.  ABIs: Right ABI: Resting right ankle-brachial index is within normal range. No  evidence of significant right lower extremity arterial disease.  Left ABI: Resting left ankle-brachial index is within normal range. No  evidence of significant left lower extremity arterial disease.  Left Upper Extremity: Doppler waveforms decrease >50% with left radial  compression. Doppler waveform obliterate with left ulnar compression.      Recent Labs: 07/13/2020: ALT 34; BUN 28; Creat 0.97; Hemoglobin 17.0; Magnesium 2.2; Platelets 217; Potassium 4.4; Sodium 137; TSH 4.54    Lipid Panel    Component Value Date/Time   CHOL 148 07/13/2020 1449   TRIG 215 (H) 07/13/2020 1449   HDL 49 07/13/2020 1449   CHOLHDL 3.0 07/13/2020 1449   VLDL 23 08/26/2019 0749   LDLCALC 71 07/13/2020 1449   LDLDIRECT 120.3 11/07/2006 1321      Wt Readings from Last 3 Encounters:  09/24/20 205 lb 9.6 oz (93.3 kg)  08/06/20 208 lb (94.3 kg)  07/13/20 205 lb (93 kg)      ASSESSMENT AND PLAN: Coronary artery disease S/p CABG 08/2019 (LIMA--> LAD, SVG-->D1/D2,  SVG-->PDA, PL1, PL2).  He is doing well and has no symptoms.  Continue carvedilol, aspirin, and rosuvastatin.  OSA (obstructive sleep apnea) Continue CPAP  Aneurysm of ascending aorta (HCC) 4.0 cm on echo in 2021.  Will repeat on follow-up.  Continue carvedilol and BP control.  Mixed hyperlipidemia LDL is nearly at goal but triglycerides are elevated.  Continue rosuvastatin.  He is going to work on increasing his exercise and limiting alcohol intake.  Repeat lipids and a CMP when he follows up in 6 months.  Essential hypertension Blood pressure was poorly controlled both initially and on repeat.  He notes that he had several coffees today.  He is going to check his blood pressures at home.  We will increase losartan to 100 mg daily.  Check basic metabolic panel in a week.  Chronic combined systolic and diastolic heart failure (HCC) LVEF 40 to 45% prior to revascularization.  Repeat echo 12/2019 revealed improvement to 55 to 60%.  He is euvolemic and doing well.  Continue carvedilol.  Continue Jardiance.   Current medicines are reviewed at length with the patient today.  The patient does not have concerns regarding medicines.  The following changes have been made:  increase losartan  Labs/ tests ordered today include:   Orders Placed This Encounter  Procedures   Basic metabolic panel   AMB Referral to [facility] Pharm-D      Disposition:   FU with PharmD in 2 months.  F/u with Rance Smithson C. Oval Linsey, MD, [facility] in 6 months.   I,Mathew Stumpf,acting as a Education administrator for Skeet Latch, MD.,have documented all relevant documentation on the behalf of Skeet Latch, MD,as directed by  Skeet Latch, MD while in the presence of Skeet Latch, MD.  I, Geraldine Oval Linsey, MD have reviewed all documentation for this visit.  The documentation of the exam,  diagnosis, procedures, and orders on 09/24/2020 are all accurate and complete.   Signed, Carena Stream C. Oval Linsey, MD, [facility]  09/24/2020  12:04 PM    [name] [name]

## 2020-11-15 ENCOUNTER — Other Ambulatory Visit: Payer: Self-pay | Admitting: Internal Medicine

## 2020-12-01 ENCOUNTER — Telehealth: Payer: Self-pay

## 2020-12-01 ENCOUNTER — Ambulatory Visit: Payer: Managed Care, Other (non HMO)

## 2020-12-01 NOTE — Telephone Encounter (Signed)
Missed appt r/s lmom

## 2020-12-01 NOTE — Progress Notes (Deleted)
12/01/2020 [name] [name] [birth year] [id number]   HPI:  [name] [name] is a 55 y.o. male patient of Dr Oval Linsey, with a [place] below who presents today for hypertension clinic evaluation.  Past Medical History: CHF Chronic diastolic   CAD NSTEMI S/P CABG 5/21  DM2   CKD   OSA      Blood Pressure Goal:  130/80  Current Medications:  Family Hx:  Social Hx:   Diet:   Exercise:   Home BP readings:   Intolerances:   Labs:    Wt Readings from Last 3 Encounters:  09/24/20 205 lb 9.6 oz (93.3 kg)  08/06/20 208 lb (94.3 kg)  07/13/20 205 lb (93 kg)   BP Readings from Last 3 Encounters:  09/24/20 (!) 144/92  08/06/20 110/74  07/13/20 134/84   Pulse Readings from Last 3 Encounters:  09/24/20 84  08/06/20 97  07/13/20 74    Current Outpatient Medications  Medication Sig Dispense Refill   aspirin EC 81 MG tablet Take 81 mg by mouth daily. Swallow whole.     blood glucose meter kit and supplies KIT Dispense based on patient and insurance preference. Use up to 3 times daily as directed. (FOR ICD-10-E11.9 and Z79.4). (Patient taking differently: Inject 1 each into the skin See admin instructions. Dispense based on patient and insurance preference. Use up to 3 times daily as directed. (FOR ICD-10-E11.9 and Z79.4).) 1 each 0   carvedilol (COREG) 3.125 MG tablet Take 1 tablet (3.125 mg total) by mouth 2 (two) times daily. 60 tablet 3   citalopram (CELEXA) 40 MG tablet Take  1 tablet  Daily  for Mood & Anxiety 90 tablet 1   Continuous Blood Gluc Receiver (FREESTYLE LIBRE 2 READER) DEVI 1 each with 6 refills. Use as directed (Patient taking differently: 1 each by Other route as directed.) 1 each 6   Continuous Blood Gluc Sensor (FREESTYLE LIBRE 2 SENSOR) MISC Please supply one box and 6 refills. Use as directed (Patient taking differently: 1 each by Other route See admin instructions. Please supply one box and 6 refills. Use as directed) 1 each 6   empagliflozin  (JARDIANCE) 25 MG TABS tablet Take 1 tablet by mouth daily.     levothyroxine (SYNTHROID) 137 MCG tablet 1 TAB ON EMPTY STOMACH ONLY WATER FOR 30 MINS.NO ANTACIDS, CALC, MAGN, BIOTIN FOR 4 HRS 30 tablet 2   losartan (COZAAR) 100 MG tablet Take 1 tablet (100 mg total) by mouth daily. 90 tablet 3   metFORMIN (GLUCOPHAGE-XR) 500 MG 24 hr tablet Take 2 tablets 2 x  /day with Meals for Diabetes (Patient taking differently: Take 1,000 mg by mouth 2 (two) times daily with a meal.) 360 tablet 0   omeprazole (PRILOSEC) 40 MG capsule Take  1 capsule  Daily  to Prevent Heartburn & Indigestion 90 capsule 0   rosuvastatin (CRESTOR) 40 MG tablet Take  1 tablet  Daily  for Cholesterol 90 tablet 1   Semaglutide,0.25 or 0.5MG /DOS, (OZEMPIC, 0.25 OR 0.5 MG/DOSE,) 2 MG/1.5ML SOPN Inject 0.5 mg into the skin once a week.     VITAMIN D PO Take 10,000 Units by mouth daily.      No current facility-administered medications for this visit.    Not on File  Past Medical History:  Diagnosis Date   Aneurysm of ascending aorta ([place]) 09/24/2020   4.0 cm on echo in 2021.  Will repeat on follow-up.   Anxiety    ASTHMA 10/28/2006  Chronic combined systolic and diastolic heart failure ([place]) 09/24/2020   Coronary artery disease    Cough    Diabetes mellitus type II 2000   age 8   GERD (gastroesophageal reflux disease)    Hyperlipidemia    Hypertension 1995   Hypothyroidism 2002   thyroidectomy for Goiter   Lumbar back pain    NASH (nonalcoholic steatohepatitis)    Numbness    OSA (obstructive sleep apnea) 11/19/2010   Sleep apnea    Pt states mild case, doesn't use cpap    There were no vitals taken for this visit.  No problem-specific Assessment & Plan notes found for this encounter.   Tommy Medal PharmD CPP [facility] [address] [place], [place] [address] [phone]

## 2020-12-22 ENCOUNTER — Ambulatory Visit: Payer: Managed Care, Other (non HMO) | Admitting: Pharmacist

## 2020-12-22 ENCOUNTER — Other Ambulatory Visit: Payer: Self-pay

## 2020-12-22 VITALS — BP 138/86 | HR 81 | Resp 17 | Ht 70.5 in | Wt 212.0 lb

## 2020-12-22 DIAGNOSIS — I5042 Chronic combined systolic (congestive) and diastolic (congestive) heart failure: Secondary | ICD-10-CM

## 2020-12-22 DIAGNOSIS — I214 Non-ST elevation (NSTEMI) myocardial infarction: Secondary | ICD-10-CM | POA: Diagnosis not present

## 2020-12-22 DIAGNOSIS — L7211 Pilar cyst: Secondary | ICD-10-CM | POA: Insufficient documentation

## 2020-12-22 MED ORDER — CARVEDILOL 6.25 MG PO TABS: 6.2500 mg | ORAL_TABLET | Freq: Two times a day (BID) | ORAL | 1 refills | Status: DC

## 2020-12-22 NOTE — Progress Notes (Signed)
Patient ID: [name]                 DOB: 1966/02/01                      MRN: [id number]     HPI: [name] [name] is a 55 y.o. male referred by Dr. Oval Linsey to pharmacy clinic for HF medication management. PMH is significant for HTN, NSTEMI, CAD, CHF, HLD, and T2DM. Patient is s/p CABF x7.. Most recent LVEF 55-60% on 12/25/19.  Today she returns to pharmacy clinic for further medication titration. At last visit with Dr Oval Linsey blood pressure had increased again despite being at goal earlier the Summer.. Symptomatically, he is feeling well. Denies dizziness, lightheadedness, and fatigue. Denies chest pain or palpitations. Denies SOB. Able to complete all ADLs. Activity level is increasing.  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 Has not been checking weight at home.  Patient has not been following a low salt diet. Dinner last night was a hot dog an potato chops.    Patient reports that he is experiencing odd side effects that he is not sure may be due to losartan.  He feels tired and his lips feel numb.  Can not recall if it started since losartan was increased or before.  Continues to forget to take evening dose of carvedilol 2-3 times a week.  Is not checking BP at home.  Due for BMP today.  Current CHF meds: losartan 100mg  daily, carvedilol 3.125mg  BID, Jardiance 10 mg daily   Previously tried: metoprolol BP goal: <130/80  Wt Readings from Last 3 Encounters:  12/22/20 212 lb (96.2 kg)  09/24/20 205 lb 9.6 oz (93.3 kg)  08/06/20 208 lb (94.3 kg)   BP Readings from Last 3 Encounters:  12/22/20 (!) 160/98  09/24/20 (!) 144/92  08/06/20 110/74   Pulse Readings from Last 3 Encounters:  12/22/20 81  09/24/20 84  08/06/20 97    Renal function: CrCl cannot be calculated (Patient's most recent lab result is older than the maximum 21 days allowed.).  Past Medical History:  Diagnosis Date   Aneurysm of ascending aorta ([place]) 09/24/2020   4.0 cm on echo in 2021.  Will  repeat on follow-up.   Anxiety    ASTHMA 10/28/2006   Chronic combined systolic and diastolic heart failure ([place]) 09/24/2020   Coronary artery disease    Cough    Diabetes mellitus type II 2000   age 72   GERD (gastroesophageal reflux disease)    Hyperlipidemia    Hypertension 1995   Hypothyroidism 2002   thyroidectomy for Goiter   Lumbar back pain    NASH (nonalcoholic steatohepatitis)    Numbness    OSA (obstructive sleep apnea) 11/19/2010   Sleep apnea    Pt states mild case, doesn't use cpap    Current Outpatient Medications on File Prior to Visit  Medication Sig Dispense Refill   aspirin EC 81 MG tablet Take 81 mg by mouth daily. Swallow whole.     atorvastatin (LIPITOR) 80 MG tablet 1 tablet     blood glucose meter kit and supplies KIT Dispense based on patient and insurance preference. Use up to 3 times daily as directed. (FOR ICD-10-E11.9 and Z79.4). (Patient taking differently: Inject 1 each into the skin See admin instructions. Dispense based on patient and insurance preference. Use up to 3 times daily as directed. (FOR ICD-10-E11.9 and Z79.4).) 1 each 0   carvedilol (COREG) 3.125  MG tablet Take 1 tablet (3.125 mg total) by mouth 2 (two) times daily. 60 tablet 3   Cholecalciferol 50 MCG (2000 UT) CAPS 1 tablet     citalopram (CELEXA) 40 MG tablet Take  1 tablet  Daily  for Mood & Anxiety 90 tablet 1   Continuous Blood Gluc Receiver (FREESTYLE LIBRE 2 READER) DEVI 1 each with 6 refills. Use as directed (Patient taking differently: 1 each by Other route as directed.) 1 each 6   Continuous Blood Gluc Sensor (FREESTYLE LIBRE 2 SENSOR) MISC Please supply one box and 6 refills. Use as directed (Patient taking differently: 1 each by Other route See admin instructions. Please supply one box and 6 refills. Use as directed) 1 each 6   empagliflozin (JARDIANCE) 25 MG TABS tablet Take 1 tablet by mouth daily.     levothyroxine (SYNTHROID) 137 MCG tablet 1 TAB ON EMPTY STOMACH ONLY WATER  FOR 30 MINS.NO ANTACIDS, CALC, MAGN, BIOTIN FOR 4 HRS 30 tablet 2   losartan (COZAAR) 100 MG tablet Take 1 tablet (100 mg total) by mouth daily. 90 tablet 3   metFORMIN (GLUCOPHAGE-XR) 500 MG 24 hr tablet Take 2 tablets 2 x  /day with Meals for Diabetes (Patient taking differently: Take 1,000 mg by mouth 2 (two) times daily with a meal.) 360 tablet 0   omeprazole (PRILOSEC) 40 MG capsule Take  1 capsule  Daily  to Prevent Heartburn & Indigestion 90 capsule 0   rosuvastatin (CRESTOR) 40 MG tablet Take  1 tablet  Daily  for Cholesterol 90 tablet 1   Semaglutide,0.25 or 0.5MG /DOS, (OZEMPIC, 0.25 OR 0.5 MG/DOSE,) 2 MG/1.5ML SOPN Inject 0.5 mg into the skin once a week.     insulin glargine (LANTUS) 100 UNIT/ML injection  (Patient not taking: Reported on 12/22/2020)     insulin lispro (HUMALOG) 100 UNIT/ML injection  (Patient not taking: Reported on 12/22/2020)     levothyroxine (SYNTHROID) 75 MCG tablet 1 tablet on an empty stomach in the morning (Patient not taking: Reported on 12/22/2020)     sitaGLIPtin (JANUVIA) 50 MG tablet See admin instructions. (Patient not taking: Reported on 12/22/2020)     No current facility-administered medications on file prior to visit.    Not on File   Assessment/Plan:  1. CHF -  Patient BP in room initially 160/98. Rechecked towards end of visit and had decreased to 138/86 which is improved but both above goal of <130/80.  Encouraged patient to work on medication compliance with evening dose of carvedilol.  Encouraged patient to try to reduce the amount of sodium in his diet and that dinner of hot dogs and potato chips is not particularly heart healthy.  Recommended increasing physical activity.  Patient reports numbing feeling in lips which he is not sure is due to losartan or not.  Since he believes he has noticed it more since dose increase, will have patient reduce dose back to 50mg  for one week and then call patient to see if symptoms resolve.  If not, can consider  different medication.  If it does, will continue on 50mg  daily.  Will increase carvedilol to 6.25mg  BID Reduce losartan to 50mg  once daily for 1 week Contact patient in 1 week to check on symptoms Check BMP Recheck in 1 month

## 2020-12-22 NOTE — Patient Instructions (Addendum)
It was good seeing you again  We would like to have your blood pressure less than 130/80  Continue your Jardiance 25mg  daily  We will increase your carvedilol to 6.25mg  twice a day.  You can take 2 of your 3.125mg  tablets twice a day until you run out.  We will see if reducing your losartan to 50mg  daily stops your lips from feeling numb  We will check your lab work today  I will call you in a week or two to see how you are feeling  Please call with any questions  Karren Cobble, PharmD, BCACP, [place], [place] [address], [place], [place] [address] Phone: [phone]; Fax: [phone] 12/22/2020 2:04 PM

## 2020-12-23 LAB — BASIC METABOLIC PANEL
BUN/Creatinine Ratio: 25 — ABNORMAL HIGH (ref 9–20)
BUN: 25 mg/dL — ABNORMAL HIGH (ref 6–24)
CO2: 22 mmol/L (ref 20–29)
Calcium: 9.7 mg/dL (ref 8.7–10.2)
Chloride: 96 mmol/L (ref 96–106)
Creatinine, Ser: 1.02 mg/dL (ref 0.76–1.27)
Glucose: 133 mg/dL — ABNORMAL HIGH (ref 65–99)
Potassium: 4.9 mmol/L (ref 3.5–5.2)
Sodium: 136 mmol/L (ref 134–144)
eGFR: 86 mL/min/{1.73_m2} (ref >59)

## 2020-12-30 ENCOUNTER — Telehealth: Payer: Self-pay | Admitting: Pharmacist

## 2020-12-30 NOTE — Telephone Encounter (Signed)
Called and left message on machine to check on patient's BP and possible adverse effects to losartan

## 2021-01-09 ENCOUNTER — Encounter: Payer: Self-pay | Admitting: Gastroenterology

## 2021-01-20 NOTE — Progress Notes (Deleted)
FOLLOW UP 6 MONTH   Assessment and Plan:   Essential hypertension  Will start Losartan 50mg  daily.  Was previously taking losartan/hctz 100/25 mg daily Monitor blood pressure at home; call if consistently over 130/80, discussed ideally <120/80 Continue DASH diet.   Reminder to go to the ER if any CP, SOB, nausea, dizziness, severe HA, changes vision/speech, left arm numbness and tingling and jaw pain.  Hyperlipademia associated with T2DM (HCC) Continued Rosuvastatin 40 mg nightly Reviewed LDL goal <70 Continue low cholesterol diet and exercise.  Check lipid panel.  Has tried atorvastatin in the past   Diabetes with diabetic chronic kidney disease Medications: metformin - add 3rd tab with dinner; increase to 2 tabs in PM if tolerating after 2 weeks for total of 4 tabs daily; discussed imodium PRN if initial diarrhea Ozempic 0.5mg  nightly Discussed Emertol as needed Discussed low glucose overnight is more dangerous than high; call with any concerns or hypoglycemia Discussed disease and risks Discussed diet/exercise, weight management  A1C  Hypothyroidism Taking levothyroxine 145mcg one tablet daily in am continue medications the same pending lab results reminded to take on an empty stomach 30-21mins before food.  check TSH level  Vitamin D Def Taking 4000 IU daily/more consistently Continue supplementation for goal of 60-100 Check vitamin D level  GERD Symptoms well managed without breakthrough Will try to get off PPI, Omeprazole 40mg , Given info for taper and famotidine sent in Discussed diet, avoiding triggers and other lifestyle changes  Depression/anxiety Continue medications: Citalopram 40mg  daily Lifestyle discussed: diet/exerise, sleep hygiene, stress management, hydration    Continue diet and meds as discussed. Further disposition pending results of labs. Discussed med's effects and SE's.    Future Appointments  Date Time Provider [place]   01/21/2021  2:30 PM Magda Bernheim, NP [facility] None  01/27/2021  2:00 PM [facility] PHARMACIST [facility]  03/22/2021  1:20 PM Skeet Latch, MD [facility]  07/14/2021  2:00 PM Unk Pinto, MD [facility] None     HPI 55 y.o. male  presents for 3 month follow up with HTN, HLD, DMII, GERD, depression, hypothyroidism, CKD, OSA and vitamin D deficiency.  Reports overall he is doing well.  He works at [employer].  He exercises 2-3 times a week.  He reports he had a video appointment with Dr Oval Linsey this am as he was having some elevated blood pressure.  Controlled in office today.  Losartan 50mg  daily, he will start this tomorrow after getting prescription.  He is right handed, had injury right hand, saw Dr. Fredna Dow that wanted an MRI but the patient states it was too expensive.   He admits to drinking 6-8 beers a day.   he has a diagnosis of depression and is currently on celexa 40 mg daily, reports symptoms are well controlled on current regimen.   he has a diagnosis of GERD which is currently managed by omeprazole 20 mg daily  he reports symptoms is currently well controlled, and denies breakthrough reflux, burning in chest, hoarseness or cough.  He is having some mild intermittent nausea related to semiglutin 0.5mg  injections  BMI is There is no height or weight on file to calculate BMI., he has not been working on diet and exercise, is generally active in his yardwork and going to [place] most weekends.  He avoids sweet drinks Wt Readings from Last 3 Encounters:  12/22/20 212 lb (96.2 kg)  09/24/20 205 lb 9.6 oz (93.3 kg)  08/06/20 208 lb (94.3 kg)  He does not currently check BP at home, today their BP is    He does not workout. He denies chest pain, shortness of breath, dizziness. He has Chronic diastolic systolic heart failure, CAD s/p CABG 08/2019, mild ascending aorta aneurysm.  He underwent left heart catheterization 08/26/2019 that revealed diffuse  three-vessel disease, including 90% left main disease.  He subsequently underwent CABG (LIMA--> LAD, SVG-->D1/D2, SVG-->PDA, PL1, PL2) with Dr. Cyndia Bent.  Echo that admission revealed LVEF 40 to 45% with grade 2 diastolic dysfunction.  LVEF improved to 60-65% on echo 9/021.  He was seen in the hospital 09/2019 with chest pain and dizziness.  He was hypotensive to the 10U systolic.  He last followed up with Jory Sims, DNP, on 09/2019 and was doing well.  He elected not to participate in cardiac rehab.  Last OV with [facility] was 02/10/20. He is on cholesterol medication (atorvastatin 80 mg daily) and denies myalgias. His cholesterol is not at goal. The cholesterol last visit was:   Lab Results  Component Value Date   CHOL 148 07/13/2020   HDL 49 07/13/2020   LDLCALC 71 07/13/2020   LDLDIRECT 120.3 11/07/2006   TRIG 215 (H) 07/13/2020   CHOLHDL 3.0 07/13/2020    He has not been working on diet and exercise for T2DM (on metformin 1000 mg in AM, hasn't tolerated in PM, novolin 70/30 40 units AM and ? units PM, not typically taking, endorses having to draw up as a barrier, would prefer pen if possible, and denies hypoglycemia , increased appetite, nausea, paresthesia of the feet, polydipsia, polyuria and visual disturbances. He admits not taking glucose regularly, has all needed supplies, when checking fasting typically 170-180. Last A1C in the office was:  Lab Results  Component Value Date   HGBA1C 7.1 (H) 07/13/2020   Patient has been on thyroid Replacement since thyroidectomy for goiter in 2002. His medication was not changed last visit. Taking levothyroxine 137 mcg, takes with all other AM medications with breakfast, has always taken it this way.   Lab Results  Component Value Date   TSH 4.54 (H) 07/13/2020   Patient is on Vitamin D supplement, was out at the last visit, currently taking 4000 IU  Lab Results  Component Value Date   VD25OH 76 07/13/2020       Current Medications:  Current  Outpatient Medications on File Prior to Visit  Medication Sig Dispense Refill   aspirin EC 81 MG tablet Take 81 mg by mouth daily. Swallow whole.     atorvastatin (LIPITOR) 80 MG tablet 1 tablet     blood glucose meter kit and supplies KIT Dispense based on patient and insurance preference. Use up to 3 times daily as directed. (FOR ICD-10-E11.9 and Z79.4). (Patient taking differently: Inject 1 each into the skin See admin instructions. Dispense based on patient and insurance preference. Use up to 3 times daily as directed. (FOR ICD-10-E11.9 and Z79.4).) 1 each 0   carvedilol (COREG) 6.25 MG tablet Take 1 tablet (6.25 mg total) by mouth 2 (two) times daily. 180 tablet 1   cetirizine (ZYRTEC) 10 MG tablet Take 10 mg by mouth daily.     Cholecalciferol 50 MCG (2000 UT) CAPS 1 tablet     citalopram (CELEXA) 40 MG tablet Take  1 tablet  Daily  for Mood & Anxiety 90 tablet 1   Continuous Blood Gluc Receiver (FREESTYLE LIBRE 2 READER) DEVI 1 each with 6 refills. Use as directed (Patient taking differently: 1 each by  Other route as directed.) 1 each 6   Continuous Blood Gluc Sensor (FREESTYLE LIBRE 2 SENSOR) MISC Please supply one box and 6 refills. Use as directed (Patient taking differently: 1 each by Other route See admin instructions. Please supply one box and 6 refills. Use as directed) 1 each 6   empagliflozin (JARDIANCE) 25 MG TABS tablet Take 1 tablet by mouth daily.     insulin glargine (LANTUS) 100 UNIT/ML injection  (Patient not taking: Reported on 12/22/2020)     insulin lispro (HUMALOG) 100 UNIT/ML injection  (Patient not taking: Reported on 12/22/2020)     levothyroxine (SYNTHROID) 137 MCG tablet 1 TAB ON EMPTY STOMACH ONLY WATER FOR 30 MINS.NO ANTACIDS, CALC, MAGN, BIOTIN FOR 4 HRS 30 tablet 2   levothyroxine (SYNTHROID) 75 MCG tablet 1 tablet on an empty stomach in the morning (Patient not taking: Reported on 12/22/2020)     losartan (COZAAR) 100 MG tablet Take 0.5 tablets (50 mg total) by mouth  daily. 90 tablet 3   metFORMIN (GLUCOPHAGE-XR) 500 MG 24 hr tablet Take 2 tablets 2 x  /day with Meals for Diabetes (Patient taking differently: Take 1,000 mg by mouth 2 (two) times daily with a meal.) 360 tablet 0   omeprazole (PRILOSEC) 40 MG capsule Take  1 capsule  Daily  to Prevent Heartburn & Indigestion 90 capsule 0   rosuvastatin (CRESTOR) 40 MG tablet Take  1 tablet  Daily  for Cholesterol 90 tablet 1   Semaglutide,0.25 or 0.5MG /DOS, (OZEMPIC, 0.25 OR 0.5 MG/DOSE,) 2 MG/1.5ML SOPN Inject 0.5 mg into the skin once a week.     sitaGLIPtin (JANUVIA) 50 MG tablet See admin instructions. (Patient not taking: Reported on 12/22/2020)     No current facility-administered medications on file prior to visit.   Medical History:  Past Medical History:  Diagnosis Date   Aneurysm of ascending aorta ([place]) 09/24/2020   4.0 cm on echo in 2021.  Will repeat on follow-up.   Anxiety    ASTHMA 10/28/2006   Chronic combined systolic and diastolic heart failure ([place]) 09/24/2020   Coronary artery disease    Cough    Diabetes mellitus type II 2000   age 25   GERD (gastroesophageal reflux disease)    Hyperlipidemia    Hypertension 1995   Hypothyroidism 2002   thyroidectomy for Goiter   Lumbar back pain    NASH (nonalcoholic steatohepatitis)    Numbness    OSA (obstructive sleep apnea) 11/19/2010   Sleep apnea    Pt states mild case, doesn't use cpap   Allergies: Not on File   Review of Systems:  Review of Systems  Constitutional:  Negative for chills, diaphoresis, fever, malaise/fatigue and weight loss.  HENT:  Negative for congestion, ear pain, hearing loss, sinus pain, sore throat and tinnitus.   Eyes: Negative.  Negative for blurred vision and double vision.  Respiratory:  Negative for cough, shortness of breath and wheezing.   Cardiovascular:  Negative for chest pain, palpitations, orthopnea and leg swelling.  Gastrointestinal:  Negative for abdominal pain, blood in stool, constipation,  diarrhea, heartburn, melena, nausea and vomiting.  Genitourinary:  Negative for urgency.  Musculoskeletal:  Negative for falls, joint pain and myalgias.  Skin:  Negative for rash.  Neurological:  Negative for dizziness, tingling, tremors, focal weakness, loss of consciousness and headaches.  Psychiatric/Behavioral:  Negative for depression, memory loss, substance abuse and suicidal ideas. The patient is not nervous/anxious and does not have insomnia.    Family history-  Review and unchanged  Social history- Review and unchanged  Physical Exam: There were no vitals taken for this visit. Wt Readings from Last 3 Encounters:  12/22/20 212 lb (96.2 kg)  09/24/20 205 lb 9.6 oz (93.3 kg)  08/06/20 208 lb (94.3 kg)   General Appearance: Well nourished well developed, non-toxic appearing, in no apparent distress. Eyes: PERRLA, horizontal nystagmus with a rotational component, No conjunctival swelling or erythema ENT/Mouth: Ear canals clear with no erythema, swelling, or discharge.  TMs normal bilaterally, oropharynx clear, moist, with no exudate.   Neck: Supple, thyroid normal, no JVD, no cervical adenopathy.  Respiratory: Respiratory effort normal, breath sounds clear A&P, no wheeze, rhonchi or rales noted.  No retractions, no accessory muscle usage Cardio: RRR with no MRGs. No noted edema.  Abdomen: Soft, + BS.  Non tender, no guarding, rebound, hernias, masses. Musculoskeletal: Full ROM, 5/5 strength, Normal gait. R 2nd digit crosses over 3rd digit permanently.  Skin: Warm, dry without rashes, lesions, ecchymosis.  Neuro: Awake and oriented X 3, Cranial nerves intact. No cerebellar symptoms.  Normal gait, normal strength.  Psych: normal affect, Insight and Judgment appropriate.    Magda Bernheim, NP 8:43 AM [facility]

## 2021-01-21 ENCOUNTER — Ambulatory Visit: Payer: Managed Care, Other (non HMO) | Admitting: Nurse Practitioner

## 2021-01-21 DIAGNOSIS — N181 Chronic kidney disease, stage 1: Secondary | ICD-10-CM

## 2021-01-21 DIAGNOSIS — K219 Gastro-esophageal reflux disease without esophagitis: Secondary | ICD-10-CM

## 2021-01-21 DIAGNOSIS — F419 Anxiety disorder, unspecified: Secondary | ICD-10-CM

## 2021-01-21 DIAGNOSIS — F32A Depression, unspecified: Secondary | ICD-10-CM

## 2021-01-21 DIAGNOSIS — I10 Essential (primary) hypertension: Secondary | ICD-10-CM

## 2021-01-21 DIAGNOSIS — E1169 Type 2 diabetes mellitus with other specified complication: Secondary | ICD-10-CM

## 2021-01-21 DIAGNOSIS — E039 Hypothyroidism, unspecified: Secondary | ICD-10-CM

## 2021-01-21 DIAGNOSIS — E559 Vitamin D deficiency, unspecified: Secondary | ICD-10-CM

## 2021-01-21 IMAGING — DX DG CHEST 2V
2 series · 2 of 2 positions shown · non-contrast
Comparison: 08/31/2019

CLINICAL DATA: Post CABG

EXAM:
CHEST - 2 VIEW

[dg chest 2 view (1 of 2)]
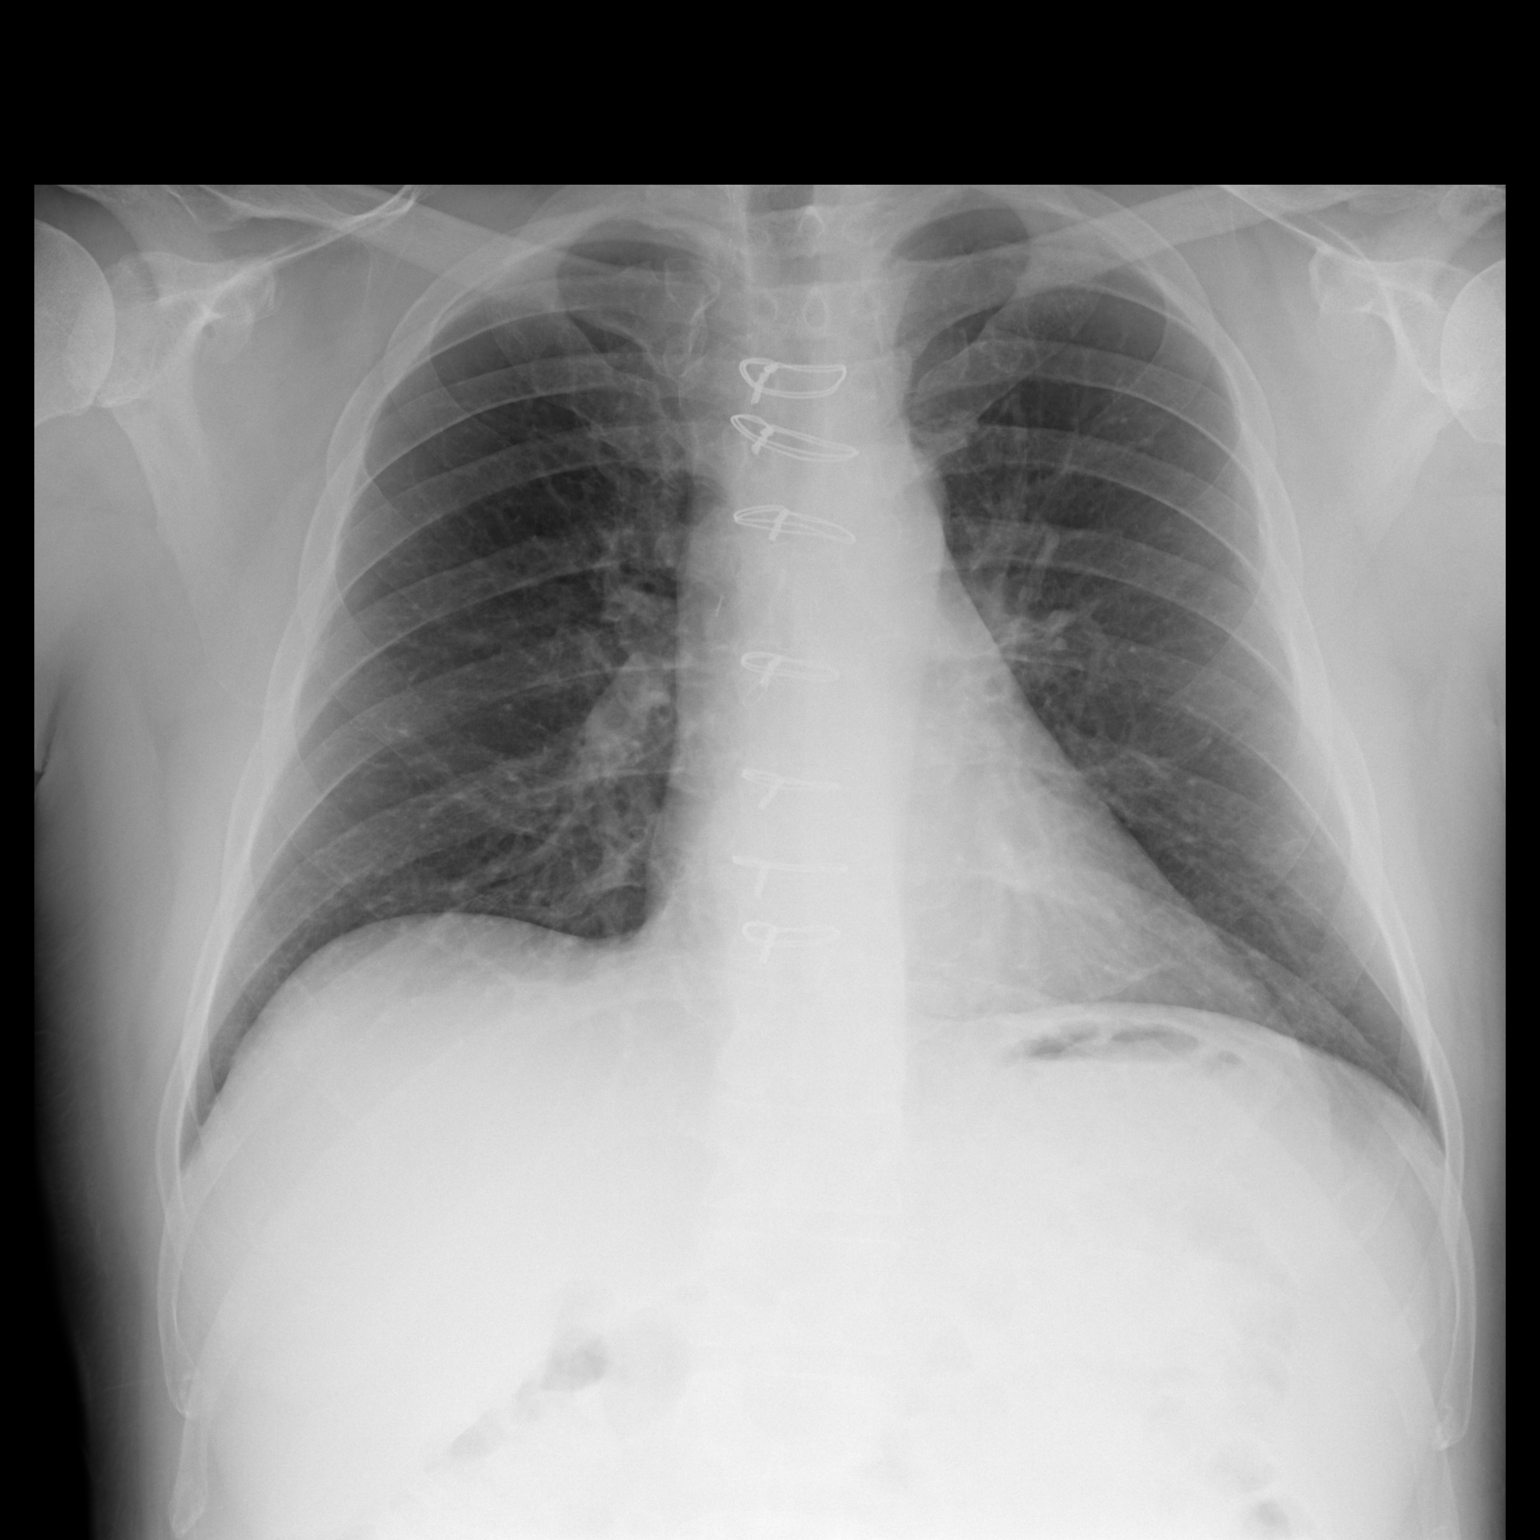

[dg chest 2 view (2 of 2)]
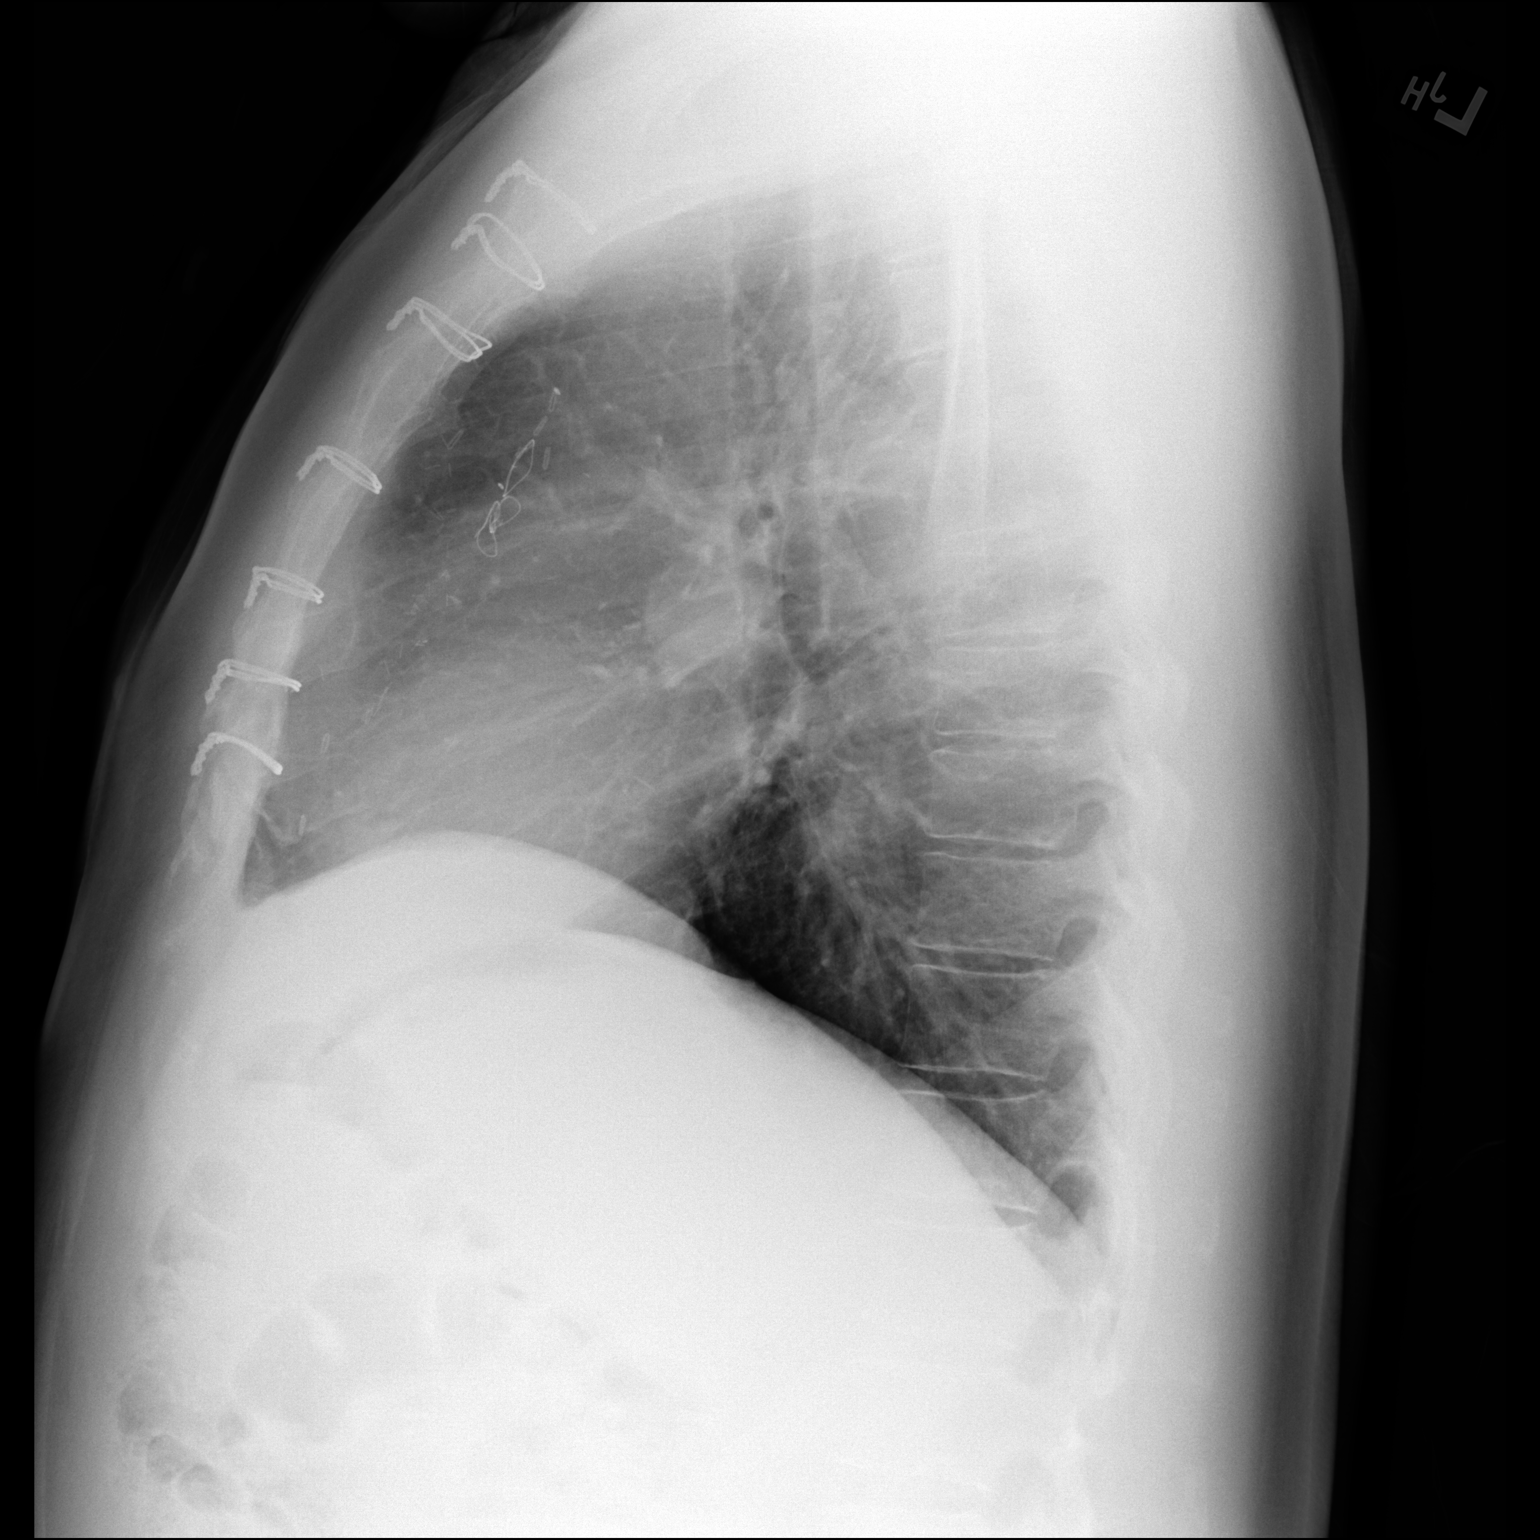

[2 of 2 positions shown; findings below may reference images not displayed]

FINDINGS: Normal heart size post CABG.

Mediastinal contours and pulmonary vascularity normal.

Lungs clear.

No infiltrate, pleural effusion or pneumothorax.

Surgical clips in the RIGHT cervical region noted.

No acute osseous findings.
IMPRESSION: No acute abnormalities.

## 2021-01-27 ENCOUNTER — Telehealth: Payer: Self-pay

## 2021-01-27 ENCOUNTER — Ambulatory Visit: Payer: Managed Care, Other (non HMO)

## 2021-01-27 NOTE — Telephone Encounter (Signed)
LMOM TO R/S APPT

## 2021-01-27 NOTE — Progress Notes (Deleted)
Patient ID: OSTIN MATHEY                 DOB: Mar 04, 1966                      MRN: [id number]     HPI: [name] [name] is a 55 y.o. male referred by Dr. Oval Linsey to pharmacy clinic for HF medication management. HTN, NSTEMI, CAD, CHF, HLD, and T2DM. Patient is s/p CABF x7.. Most recent LVEF 55-60% on 12/25/19.  Today she returns to pharmacy clinic for further medication titration. At last visit with MD ***. Symptomatically, she is feeling ***, *** dizziness, lightheadedness, and fatigue. *** chest pain or palpitations. Feels SOB when ***. Able to complete all ADLs. Activity level ***. She *** checks her weight at home (normal range *** - *** lbs). *** LEE, PND, or orthopnea. Appetite has been ***. She *** adheres to a low-salt diet.  Current CHF meds:  Previously tried:  BP goal:   Family History:   Social History:   Diet:   Exercise:   Home BP readings:   Wt Readings from Last 3 Encounters:  12/22/20 212 lb (96.2 kg)  09/24/20 205 lb 9.6 oz (93.3 kg)  08/06/20 208 lb (94.3 kg)   BP Readings from Last 3 Encounters:  12/22/20 138/86  09/24/20 (!) 144/92  08/06/20 110/74   Pulse Readings from Last 3 Encounters:  12/22/20 81  09/24/20 84  08/06/20 97    Renal function: CrCl cannot be calculated (Patient's most recent lab result is older than the maximum 21 days allowed.).  Past Medical History:  Diagnosis Date   Aneurysm of ascending aorta ([place]) 09/24/2020   4.0 cm on echo in 2021.  Will repeat on follow-up.   Anxiety    ASTHMA 10/28/2006   Chronic combined systolic and diastolic heart failure ([place]) 09/24/2020   Coronary artery disease    Cough    Diabetes mellitus type II 2000   age 29   GERD (gastroesophageal reflux disease)    Hyperlipidemia    Hypertension 1995   Hypothyroidism 2002   thyroidectomy for Goiter   Lumbar back pain    NASH (nonalcoholic steatohepatitis)    Numbness    OSA (obstructive sleep apnea) 11/19/2010   Sleep apnea    Pt states  mild case, doesn't use cpap    Current Outpatient Medications on File Prior to Visit  Medication Sig Dispense Refill   aspirin EC 81 MG tablet Take 81 mg by mouth daily. Swallow whole.     atorvastatin (LIPITOR) 80 MG tablet 1 tablet     blood glucose meter kit and supplies KIT Dispense based on patient and insurance preference. Use up to 3 times daily as directed. (FOR ICD-10-E11.9 and Z79.4). (Patient taking differently: Inject 1 each into the skin See admin instructions. Dispense based on patient and insurance preference. Use up to 3 times daily as directed. (FOR ICD-10-E11.9 and Z79.4).) 1 each 0   carvedilol (COREG) 6.25 MG tablet Take 1 tablet (6.25 mg total) by mouth 2 (two) times daily. 180 tablet 1   cetirizine (ZYRTEC) 10 MG tablet Take 10 mg by mouth daily.     Cholecalciferol 50 MCG (2000 UT) CAPS 1 tablet     citalopram (CELEXA) 40 MG tablet Take  1 tablet  Daily  for Mood & Anxiety 90 tablet 1   Continuous Blood Gluc Receiver (FREESTYLE LIBRE 2 READER) DEVI 1 each with 6 refills. Use as directed (  Patient taking differently: 1 each by Other route as directed.) 1 each 6   Continuous Blood Gluc Sensor (FREESTYLE LIBRE 2 SENSOR) MISC Please supply one box and 6 refills. Use as directed (Patient taking differently: 1 each by Other route See admin instructions. Please supply one box and 6 refills. Use as directed) 1 each 6   empagliflozin (JARDIANCE) 25 MG TABS tablet Take 1 tablet by mouth daily.     insulin glargine (LANTUS) 100 UNIT/ML injection  (Patient not taking: Reported on 12/22/2020)     insulin lispro (HUMALOG) 100 UNIT/ML injection  (Patient not taking: Reported on 12/22/2020)     levothyroxine (SYNTHROID) 137 MCG tablet 1 TAB ON EMPTY STOMACH ONLY WATER FOR 30 MINS.NO ANTACIDS, CALC, MAGN, BIOTIN FOR 4 HRS 30 tablet 2   levothyroxine (SYNTHROID) 75 MCG tablet 1 tablet on an empty stomach in the morning (Patient not taking: Reported on 12/22/2020)     losartan (COZAAR) 100 MG  tablet Take 0.5 tablets (50 mg total) by mouth daily. 90 tablet 3   metFORMIN (GLUCOPHAGE-XR) 500 MG 24 hr tablet Take 2 tablets 2 x  /day with Meals for Diabetes (Patient taking differently: Take 1,000 mg by mouth 2 (two) times daily with a meal.) 360 tablet 0   omeprazole (PRILOSEC) 40 MG capsule Take  1 capsule  Daily  to Prevent Heartburn & Indigestion 90 capsule 0   rosuvastatin (CRESTOR) 40 MG tablet Take  1 tablet  Daily  for Cholesterol 90 tablet 1   Semaglutide,0.25 or 0.5MG /DOS, (OZEMPIC, 0.25 OR 0.5 MG/DOSE,) 2 MG/1.5ML SOPN Inject 0.5 mg into the skin once a week.     sitaGLIPtin (JANUVIA) 50 MG tablet See admin instructions. (Patient not taking: Reported on 12/22/2020)     No current facility-administered medications on file prior to visit.    Not on File   Assessment/Plan:  1. CHF -

## 2021-02-03 NOTE — Progress Notes (Signed)
FOLLOW UP 6 MONTH   Assessment and Plan:   Essential hypertension  Will start Losartan 100mg  daily.  Monitor blood pressure at home; call if consistently over 130/80, discussed ideally <120/80 Continue DASH diet.   Reminder to go to the ER if any CP, SOB, nausea, dizziness, severe HA, changes vision/speech, left arm numbness and tingling and jaw pain.  Hyperlipidemia associated with T2DM (HCC) Continued Rosuvastatin 40 mg nightly Reviewed LDL goal <70 Continue low cholesterol diet and exercise.  Check lipid panel.  Has tried atorvastatin in the past  Chronic combined systolic and diastolic heart failure/CAD Continue to follow with Dr. Oval Linsey LVEF improved on 12/2019 echo to 55-60% Continue carvedilol and jardiance, aspirin and rosuvastatin  Diabetes with diabetic chronic kidney disease Medications: metformin 500 mg 2 tabs BID Ozempic 0.5mg  nightly Jardiance 25 mg qd Discussed Emertol as needed Discussed low glucose overnight is more dangerous than high; call with any concerns or hypoglycemia Discussed disease and risks Discussed diet/exercise, weight management  A1C  Class 2 severe obesity due to excess calories with serious comorbidity Strongly encouraged to focus on diet and exercise  Hypothyroidism Taking levothyroxine 118mcg one tablet daily in am continue medications the same pending lab results reminded to take on an empty stomach 30-79mins before food.  check TSH level  Vitamin D Def Taking 4000 IU daily/more consistently Continue supplementation for goal of 60-100 Check vitamin D level  GERD Symptoms well managed without breakthrough Will try to get off PPI, Omeprazole 40mg , Given info for taper and famotidine sent in Discussed diet, avoiding triggers and other lifestyle changes  Depression/anxiety Continue medications: Citalopram 40mg  daily Lifestyle discussed: diet/exerise, sleep hygiene, stress management, hydration    Continue diet and meds as  discussed. Further disposition pending results of labs. Discussed med's effects and SE's.    Future Appointments  Date Time Provider [place]  02/19/2021  2:00 PM [facility]  03/22/2021  1:20 PM Skeet Latch, MD [facility]  07/14/2021  2:00 PM Unk Pinto, MD [facility] None     HPI 55 y.o. male  presents for 3 month follow up with HTN, HLD, DMII, GERD, depression, hypothyroidism, CKD, OSA and vitamin D deficiency.  Reports overall he is doing well.  He works at [employer].  He exercises 2-3 times a week.  He admits to drinking 4-6 beers a day.   he has a diagnosis of depression and is currently on celexa 40 mg daily, reports symptoms are well controlled on current regimen.  Fell at [employer] concert 2 months ago and got deep laceration on right knee, is almost completely healed.  Has a scab which is almost falling off.  Did not sprain or break anything.   he has a diagnosis of GERD which is currently managed by omeprazole 40 mg daily  he reports symptoms is currently well controlled, and denies breakthrough reflux, burning in chest, hoarseness or cough.    BMI is Body mass index is 30.16 kg/m., he has not been working on diet and exercise, is generally active in his yardwork and [place] most weekends.  He avoids sweet drinks Wt Readings from Last 3 Encounters:  02/08/21 213 lb 3.2 oz (96.7 kg)  12/22/20 212 lb (96.2 kg)  09/24/20 205 lb 9.6 oz (93.3 kg)   He does not currently check BP at home, today their BP is BP: 114/68  He does not workout. He denies chest pain, shortness of breath, dizziness. He has Chronic diastolic  systolic heart failure, CAD s/p CABG 08/2019, mild ascending aorta aneurysm.  He underwent left heart catheterization 08/26/2019 that revealed diffuse three-vessel disease, including 90% left main disease.  He subsequently underwent CABG (LIMA--> LAD, SVG-->D1/D2, SVG-->PDA, PL1, PL2) with Dr.  Cyndia Bent.  Echo that admission revealed LVEF 40 to 45% with grade 2 diastolic dysfunction.  LVEF improved to 60-65% on echo 9/021.  He was seen in the hospital 09/2019 with chest pain and dizziness.  He was hypotensive to the 97L systolic.  He last followed up with Jory Sims, DNP, on 09/2019 and was doing well.  He elected not to participate in cardiac rehab.  Last OV with [facility] was 12/22/20. He is on cholesterol medication (atorvastatin 80 mg daily) and denies myalgias. His cholesterol is not at goal. The cholesterol last visit was:   Lab Results  Component Value Date   CHOL 148 07/13/2020   HDL 49 07/13/2020   LDLCALC 71 07/13/2020   LDLDIRECT 120.3 11/07/2006   TRIG 215 (H) 07/13/2020   CHOLHDL 3.0 07/13/2020    He has not been working on diet and exercise for T2DM (on metformin 1000 mg in AM, hasn't tolerated in PM, ozempic 0.$RemoveBefor'5mg'sIGRetgMTROR$  SQ QW, and Jardiance 25 mg QD) hypoglycemia , increased appetite, nausea, paresthesia of the feet, polydipsia, polyuria and visual disturbances. He admits not taking glucose regularly, has all needed supplies, when checking fasting typically 140-160 Last A1C in the office was:  Lab Results  Component Value Date   HGBA1C 7.1 (H) 07/13/2020   Patient has been on thyroid Replacement since thyroidectomy for goiter in 2002. His medication was not changed last visit. Taking levothyroxine 137 mcg, takes with all other AM medications with breakfast, has always taken it this way.   Lab Results  Component Value Date   TSH 4.54 (H) 07/13/2020   Patient is on Vitamin D supplement, was out at the last visit, currently taking 4000 IU  Lab Results  Component Value Date   VD25OH 76 07/13/2020       Current Medications:  Current Outpatient Medications on File Prior to Visit  Medication Sig Dispense Refill   aspirin EC 81 MG tablet Take 81 mg by mouth daily. Swallow whole.     atorvastatin (LIPITOR) 80 MG tablet 1 tablet     blood glucose meter kit and supplies KIT  Dispense based on patient and insurance preference. Use up to 3 times daily as directed. (FOR ICD-10-E11.9 and Z79.4). (Patient taking differently: Inject 1 each into the skin See admin instructions. Dispense based on patient and insurance preference. Use up to 3 times daily as directed. (FOR ICD-10-E11.9 and Z79.4).) 1 each 0   carvedilol (COREG) 6.25 MG tablet Take 1 tablet (6.25 mg total) by mouth 2 (two) times daily. 180 tablet 1   Cholecalciferol 50 MCG (2000 UT) CAPS 1 tablet     citalopram (CELEXA) 40 MG tablet Take  1 tablet  Daily  for Mood & Anxiety 90 tablet 1   Continuous Blood Gluc Sensor (FREESTYLE LIBRE 2 SENSOR) MISC Please supply one box and 6 refills. Use as directed (Patient taking differently: 1 each by Other route See admin instructions. Please supply one box and 6 refills. Use as directed) 1 each 6   empagliflozin (JARDIANCE) 25 MG TABS tablet Take 1 tablet by mouth daily.     levothyroxine (SYNTHROID) 137 MCG tablet 1 TAB ON EMPTY STOMACH ONLY WATER FOR 30 MINS.NO ANTACIDS, CALC, MAGN, BIOTIN FOR 4 HRS 30 tablet 2  losartan (COZAAR) 100 MG tablet Take 100 mg by mouth daily. 90 tablet 3   metFORMIN (GLUCOPHAGE-XR) 500 MG 24 hr tablet Take 2 tablets 2 x  /day with Meals for Diabetes (Patient taking differently: Take 1,000 mg by mouth 2 (two) times daily with a meal.) 360 tablet 0   omeprazole (PRILOSEC) 40 MG capsule Take  1 capsule  Daily  to Prevent Heartburn & Indigestion 90 capsule 0   rosuvastatin (CRESTOR) 40 MG tablet Take  1 tablet  Daily  for Cholesterol 90 tablet 1   Semaglutide,0.25 or 0.5MG /DOS, (OZEMPIC, 0.25 OR 0.5 MG/DOSE,) 2 MG/1.5ML SOPN Inject 0.5 mg into the skin once a week.     vitamin C (ASCORBIC ACID) 500 MG tablet Take 500 mg by mouth daily.     No current facility-administered medications on file prior to visit.   Medical History:  Past Medical History:  Diagnosis Date   Aneurysm of ascending aorta 09/24/2020   4.0 cm on echo in 2021.  Will repeat on  follow-up.   Anxiety    ASTHMA 10/28/2006   Chronic combined systolic and diastolic heart failure ([place]) 09/24/2020   Coronary artery disease    Cough    Diabetes mellitus type II 2000   age 67   GERD (gastroesophageal reflux disease)    Hyperlipidemia    Hypertension 1995   Hypothyroidism 2002   thyroidectomy for Goiter   Lumbar back pain    NASH (nonalcoholic steatohepatitis)    Numbness    OSA (obstructive sleep apnea) 11/19/2010   Sleep apnea    Pt states mild case, doesn't use cpap   Allergies: Not on File   Review of Systems:  Review of Systems  Constitutional:  Negative for chills, diaphoresis, fever, malaise/fatigue and weight loss.  HENT:  Positive for congestion. Negative for ear pain, hearing loss, sinus pain, sore throat and tinnitus.   Eyes: Negative.  Negative for blurred vision and double vision.  Respiratory:  Positive for cough (covid 2 weeks ago). Negative for shortness of breath and wheezing.   Cardiovascular:  Negative for chest pain, palpitations, orthopnea and leg swelling.  Gastrointestinal:  Negative for abdominal pain, blood in stool, constipation, diarrhea, heartburn, melena, nausea and vomiting.  Genitourinary:  Negative for urgency.  Musculoskeletal:  Negative for back pain, falls, joint pain and myalgias.  Skin:  Negative for rash.  Neurological:  Negative for dizziness, tingling, tremors, focal weakness, loss of consciousness and headaches.  Psychiatric/Behavioral:  Negative for depression, memory loss, substance abuse and suicidal ideas. The patient is not nervous/anxious and does not have insomnia.    Family history- Review and unchanged  Social history- Review and unchanged  Physical Exam: BP 114/68   Pulse 86   Temp 97.7 F (36.5 C)   Wt 213 lb 3.2 oz (96.7 kg)   SpO2 98%   BMI 30.16 kg/m  Wt Readings from Last 3 Encounters:  02/08/21 213 lb 3.2 oz (96.7 kg)  12/22/20 212 lb (96.2 kg)  09/24/20 205 lb 9.6 oz (93.3 kg)   General  Appearance: Well nourished well developed, non-toxic appearing, in no apparent distress. Eyes: PERRLA, horizontal nystagmus with a rotational component, No conjunctival swelling or erythema ENT/Mouth: Ear canals clear with no erythema, swelling, or discharge.  TMs normal bilaterally, oropharynx clear, moist, with no exudate.   Neck: Supple, thyroid normal, no JVD, no cervical adenopathy.  Respiratory: Respiratory effort normal, breath sounds clear A&P, no wheeze, rhonchi or rales noted.  No retractions, no accessory  muscle usage Cardio: RRR with no MRGs. No noted edema.  Abdomen: Soft, + BS.  Non tender, no guarding, rebound, hernias, masses. Musculoskeletal: Full ROM, 5/5 strength, Normal gait. R 2nd digit crosses over 3rd digit permanently.  Skin: Warm, dry without rashes, lesions, ecchymosis. Scab on right knee Neuro: Awake and oriented X 3, Cranial nerves intact. No cerebellar symptoms.  Normal gait, normal strength.  Psych: normal affect, Insight and Judgment appropriate.    Magda Bernheim, NP 2:49 PM [facility]

## 2021-02-08 ENCOUNTER — Other Ambulatory Visit: Payer: Self-pay

## 2021-02-08 ENCOUNTER — Ambulatory Visit (INDEPENDENT_AMBULATORY_CARE_PROVIDER_SITE_OTHER): Payer: Managed Care, Other (non HMO) | Admitting: Nurse Practitioner

## 2021-02-08 ENCOUNTER — Encounter: Payer: Self-pay | Admitting: Nurse Practitioner

## 2021-02-08 VITALS — BP 114/68 | HR 86 | Temp 97.7°F | Wt 213.2 lb

## 2021-02-08 DIAGNOSIS — E559 Vitamin D deficiency, unspecified: Secondary | ICD-10-CM | POA: Diagnosis not present

## 2021-02-08 DIAGNOSIS — I25110 Atherosclerotic heart disease of native coronary artery with unstable angina pectoris: Secondary | ICD-10-CM | POA: Diagnosis not present

## 2021-02-08 DIAGNOSIS — E039 Hypothyroidism, unspecified: Secondary | ICD-10-CM

## 2021-02-08 DIAGNOSIS — E1122 Type 2 diabetes mellitus with diabetic chronic kidney disease: Secondary | ICD-10-CM

## 2021-02-08 DIAGNOSIS — E785 Hyperlipidemia, unspecified: Secondary | ICD-10-CM

## 2021-02-08 DIAGNOSIS — E119 Type 2 diabetes mellitus without complications: Secondary | ICD-10-CM | POA: Diagnosis not present

## 2021-02-08 DIAGNOSIS — Z794 Long term (current) use of insulin: Secondary | ICD-10-CM

## 2021-02-08 DIAGNOSIS — N182 Chronic kidney disease, stage 2 (mild): Secondary | ICD-10-CM

## 2021-02-08 DIAGNOSIS — E1169 Type 2 diabetes mellitus with other specified complication: Secondary | ICD-10-CM | POA: Diagnosis not present

## 2021-02-08 DIAGNOSIS — I10 Essential (primary) hypertension: Secondary | ICD-10-CM | POA: Diagnosis not present

## 2021-02-08 DIAGNOSIS — I5042 Chronic combined systolic (congestive) and diastolic (congestive) heart failure: Secondary | ICD-10-CM

## 2021-02-08 NOTE — Patient Instructions (Signed)

## 2021-02-09 LAB — CBC WITH DIFFERENTIAL/PLATELET
Absolute Monocytes: 523 cells/uL (ref 200–950)
Basophils Absolute: 60 cells/uL (ref 0–200)
Basophils Relative: 0.9 %
Eosinophils Absolute: 181 cells/uL (ref 15–500)
Eosinophils Relative: 2.7 %
HCT: 43.9 % (ref 38.5–50.0)
Hemoglobin: 14.9 g/dL (ref 13.2–17.1)
Lymphs Abs: 2184 cells/uL (ref 850–3900)
MCH: 32.3 pg (ref 27.0–33.0)
MCHC: 33.9 g/dL (ref 32.0–36.0)
MCV: 95 fL (ref 80.0–100.0)
MPV: 10.8 fL (ref 7.5–12.5)
Monocytes Relative: 7.8 %
Neutro Abs: 3752 cells/uL (ref 1500–7800)
Neutrophils Relative %: 56 %
Platelets: 205 10*3/uL (ref 140–400)
RBC: 4.62 10*6/uL (ref 4.20–5.80)
RDW: 12.5 % (ref 11.0–15.0)
Total Lymphocyte: 32.6 %
WBC: 6.7 10*3/uL (ref 3.8–10.8)

## 2021-02-09 LAB — COMPLETE METABOLIC PANEL WITH GFR
AG Ratio: 1.5 (calc) (ref 1.0–2.5)
ALT: 29 U/L (ref 9–46)
AST: 20 U/L (ref 10–35)
Albumin: 4.5 g/dL (ref 3.6–5.1)
Alkaline phosphatase (APISO): 68 U/L (ref 35–144)
BUN: 20 mg/dL (ref 7–25)
CO2: 27 mmol/L (ref 20–32)
Calcium: 9.5 mg/dL (ref 8.6–10.3)
Chloride: 103 mmol/L (ref 98–110)
Creat: 1.04 mg/dL (ref 0.70–1.30)
Globulin: 3 g/dL (calc) (ref 1.9–3.7)
Glucose, Bld: 221 mg/dL — ABNORMAL HIGH (ref 65–99)
Potassium: 4 mmol/L (ref 3.5–5.3)
Sodium: 138 mmol/L (ref 135–146)
Total Bilirubin: 0.7 mg/dL (ref 0.2–1.2)
Total Protein: 7.5 g/dL (ref 6.1–8.1)
eGFR: 84 mL/min/{1.73_m2} (ref >=60)

## 2021-02-09 LAB — LIPID PANEL
Cholesterol: 145 mg/dL (ref <200)
HDL: 63 mg/dL (ref >=40)
LDL Cholesterol (Calc): 65 mg/dL (calc)
Non-HDL Cholesterol (Calc): 82 mg/dL (calc) (ref <130)
Total CHOL/HDL Ratio: 2.3 (calc) (ref <5.0)
Triglycerides: 89 mg/dL (ref <150)

## 2021-02-09 LAB — TSH: TSH: 3.79 mIU/L (ref 0.40–4.50)

## 2021-02-09 LAB — HEMOGLOBIN A1C
Hgb A1c MFr Bld: 7.1 % of total Hgb — ABNORMAL HIGH (ref <5.7)
Mean Plasma Glucose: 157 mg/dL
eAG (mmol/L): 8.7 mmol/L

## 2021-02-09 LAB — VITAMIN D 25 HYDROXY (VIT D DEFICIENCY, FRACTURES): Vit D, 25-Hydroxy: 41 ng/mL (ref 30–100)

## 2021-02-19 ENCOUNTER — Ambulatory Visit: Payer: Managed Care, Other (non HMO) | Admitting: Pharmacist

## 2021-02-19 ENCOUNTER — Other Ambulatory Visit: Payer: Self-pay

## 2021-02-19 VITALS — BP 132/88 | HR 78 | Wt 211.6 lb

## 2021-02-19 DIAGNOSIS — I5042 Chronic combined systolic (congestive) and diastolic (congestive) heart failure: Secondary | ICD-10-CM | POA: Diagnosis not present

## 2021-02-19 MED ORDER — SPIRONOLACTONE 25 MG PO TABS: 12.5000 mg | ORAL_TABLET | Freq: Every day | ORAL | 0 refills | Status: DC

## 2021-02-19 NOTE — Progress Notes (Signed)
Patient ID: [name] [name]                 DOB: 11/08/1965                      MRN: [id number]     HPI: [name] [name] is a 55 y.o. male referred by Dr. Linden to pharmacy clinic for HF medication management. PMH is significant for CHF, NSTEMI, CAD, OSA, CABG x 7, and T2DM (A1c 7.1). Most recent LVEF 55-60% on 12/25/19.  He had Covid 3 weeks ago.  Today he returns to pharmacy clinic for further medication titration. At last visit with Dr  BP had increased but has now been trending back down with further medication titration.  Symptomatically, he is feeling well, denies dizziness, lightheadedness, and fatigue. Denies chest pain or palpitations. Denies SOB Able to complete all ADLs.  Continues to work at [employer] and typically eats what they serve there.  Activity level Normal.   He has not been checking his weight at home or checking his blood pressure. Does not have a blood pressure cuff.  Denies LEE, PND, or orthopnea. Appetite has been fine.   Thinks that he may have been accidentally taking one carvedilol 6.25mg and one 3.125mg tablet instead of two 6.25mg tablets.  Current CHF meds:   Losartan 100mg daily Jardiance 25mg daily Carvedilol 6.25mg twice a day  BP goal: <130/80   Wt Readings from Last 3 Encounters:  02/08/21 213 lb 3.2 oz (96.7 kg)  12/22/20 212 lb (96.2 kg)  09/24/20 205 lb 9.6 oz (93.3 kg)   BP Readings from Last 3 Encounters:  02/08/21 114/68  12/22/20 138/86  09/24/20 (!) 144/92   Pulse Readings from Last 3 Encounters:  02/08/21 86  12/22/20 81  09/24/20 84    Renal function: Estimated Creatinine Clearance: 92.2 mL/min (by C-G formula based on SCr of 1.04 mg/dL).  Past Medical History:  Diagnosis Date   Aneurysm of ascending aorta 09/24/2020   4.0 cm on echo in 2021.  Will repeat on follow-up.   Anxiety    ASTHMA 10/28/2006   Chronic combined systolic and diastolic heart failure (HCC) 09/24/2020   Coronary artery disease     Cough    Diabetes mellitus type II 2000   age 33   GERD (gastroesophageal reflux disease)    Hyperlipidemia    Hypertension 1995   Hypothyroidism 2002   thyroidectomy for Goiter   Lumbar back pain    NASH (nonalcoholic steatohepatitis)    Numbness    OSA (obstructive sleep apnea) 11/19/2010   Sleep apnea    Pt states mild case, doesn't use cpap    Current Outpatient Medications on File Prior to Visit  Medication Sig Dispense Refill   aspirin EC 81 MG tablet Take 81 mg by mouth daily. Swallow whole.     atorvastatin (LIPITOR) 80 MG tablet 1 tablet     blood glucose meter kit and supplies KIT Dispense based on patient and insurance preference. Use up to 3 times daily as directed. (FOR ICD-10-E11.9 and Z79.4). (Patient taking differently: Inject 1 each into the skin See admin instructions. Dispense based on patient and insurance preference. Use up to 3 times daily as directed. (FOR ICD-10-E11.9 and Z79.4).) 1 each 0   carvedilol (COREG) 6.25 MG tablet Take 1 tablet (6.25 mg total) by mouth 2 (two) times daily. 180 tablet 1   Cholecalciferol 50 MCG (2000 UT) CAPS 1 tablet       citalopram (CELEXA) 40 MG tablet Take  1 tablet  Daily  for Mood & Anxiety 90 tablet 1   Continuous Blood Gluc Sensor (FREESTYLE LIBRE 2 SENSOR) MISC Please supply one box and 6 refills. Use as directed (Patient taking differently: 1 each by Other route See admin instructions. Please supply one box and 6 refills. Use as directed) 1 each 6   empagliflozin (JARDIANCE) 25 MG TABS tablet Take 1 tablet by mouth daily.     levothyroxine (SYNTHROID) 137 MCG tablet 1 TAB ON EMPTY STOMACH ONLY WATER FOR 30 MINS.NO ANTACIDS, CALC, MAGN, BIOTIN FOR 4 HRS 30 tablet 2   losartan (COZAAR) 100 MG tablet Take 100 mg by mouth daily. 90 tablet 3   metFORMIN (GLUCOPHAGE-XR) 500 MG 24 hr tablet Take 2 tablets 2 x  /day with Meals for Diabetes (Patient taking differently: Take 1,000 mg by mouth 2 (two) times daily with a meal.) 360 tablet 0    omeprazole (PRILOSEC) 40 MG capsule Take  1 capsule  Daily  to Prevent Heartburn & Indigestion 90 capsule 0   rosuvastatin (CRESTOR) 40 MG tablet Take  1 tablet  Daily  for Cholesterol 90 tablet 1   Semaglutide,0.25 or 0.5MG/DOS, (OZEMPIC, 0.25 OR 0.5 MG/DOSE,) 2 MG/1.5ML SOPN Inject 0.5 mg into the skin once a week.     vitamin C (ASCORBIC ACID) 500 MG tablet Take 500 mg by mouth daily.     No current facility-administered medications on file prior to visit.    Not on File   Assessment/Plan:  1. CHF -  Patient BP in room 132/88 which is above goal of <130/80.  Patient is tolerating medications well.  Recommended continued compliance with twice daily carvedilol since he still forgets PM dose occasionally.  Since he tolerated an increased dose of carvedilol will increase to 9.391m BID.  Will continue GDMT and add on spironolactone 12.53mdaily since labs at PCP office were normal.  Advised patient to have labs checked in 1 week.  Will recheck in clinic in 4-6 weeks. Recommended he discuss increasing Ozempic dose with endocrinology.  Continue: Losartan 10033maily Jardiance 4m41mily Increase carvedilol to 9.375mg40m Start spironolactone 12.5mg o74mdaily Check BMP in 1 week Recheck in clinic in 4 weeks  Chris Karren CobblemD, BCACP,[place] [address]: [phone]

## 2021-02-19 NOTE — Patient Instructions (Addendum)
It was good seeing you today  We would like your blood pressure to be less than 130/80  Continue: Losartan 100mg  daily Jardiance 25mg  daily  Increase your carvedilol to 9.375mg  daily (1 and 1/2 of the carvedilol 6.25mg  tablets) We will start a new medication called spironolactone.  You will take 1/2 tablet (12.5mg ) once daily  Please come on Monday November 28th to have your lab work checked  I recommend checking your blood pressure at home.  The blood pressure cuffs we recommend are made by [employer]. We recommend the Series 3 or higher or the [employer] or higher  Karren Cobble, PharmD, [place], [place], [place], [place], [place], [address] Phone: [phone], Fax: [phone]

## 2021-02-25 ENCOUNTER — Other Ambulatory Visit: Payer: Self-pay | Admitting: Adult Health

## 2021-03-22 ENCOUNTER — Ambulatory Visit (HOSPITAL_BASED_OUTPATIENT_CLINIC_OR_DEPARTMENT_OTHER): Payer: Managed Care, Other (non HMO) | Admitting: Cardiovascular Disease

## 2021-03-22 ENCOUNTER — Encounter (HOSPITAL_BASED_OUTPATIENT_CLINIC_OR_DEPARTMENT_OTHER): Payer: Self-pay | Admitting: Cardiovascular Disease

## 2021-03-22 ENCOUNTER — Other Ambulatory Visit: Payer: Self-pay

## 2021-03-22 VITALS — BP 138/82 | HR 80 | Ht 70.5 in | Wt 211.7 lb

## 2021-03-22 DIAGNOSIS — E785 Hyperlipidemia, unspecified: Secondary | ICD-10-CM

## 2021-03-22 DIAGNOSIS — E1169 Type 2 diabetes mellitus with other specified complication: Secondary | ICD-10-CM | POA: Diagnosis not present

## 2021-03-22 DIAGNOSIS — I5042 Chronic combined systolic (congestive) and diastolic (congestive) heart failure: Secondary | ICD-10-CM | POA: Diagnosis not present

## 2021-03-22 DIAGNOSIS — Z951 Presence of aortocoronary bypass graft: Secondary | ICD-10-CM

## 2021-03-22 DIAGNOSIS — I7121 Aneurysm of the ascending aorta, without rupture: Secondary | ICD-10-CM

## 2021-03-22 MED ORDER — SPIRONOLACTONE 25 MG PO TABS
25.0000 mg | ORAL_TABLET | Freq: Every day | ORAL | 3 refills | Status: DC
Start: 2021-03-22 — End: 2022-04-20

## 2021-03-22 NOTE — Assessment & Plan Note (Signed)
Ascending aortic aneurysm 4.0 cm on echo in 2021.  Blood pressure control as above.  Repeat echo.

## 2021-03-22 NOTE — Progress Notes (Signed)
Cardiology Office Note   Date:  03/22/2021   ID:  Jarold, Macomber Aug 18, 1965, MRN [id number]  PCP:  Unk Pinto, MD  Cardiologist:  Skeet Latch, MD  Electrophysiologist:  None   Evaluation Performed:  Follow-Up Visit  Chief Complaint:  hypertension  History of Present Illness:    [name] [name] is a 55 y.o. male with chronic systolic diastolic heart failure, CAD status post CABG 08/2019, mild ascending aorta aneurysm, diabetes, CKD, hypothyroidism, GERD, anxiety, OSA, and obesity who presents for follow-up.  He was initially seen in the hospital 08/2019 with chest pain in the setting of NSTEMI.  He presented to [place] and was transferred to [facility].  High-sensitivity troponin was over 13,000.  He underwent left heart catheterization 08/26/2019 that revealed diffuse three-vessel disease, including 90% left main disease.  He subsequently underwent CABG (LIMA--> LAD, SVG-->D1/D2, SVG-->PDA, PL1, PL2) with Dr. Cyndia Bent.  Echo that admission revealed LVEF 40 to 45% with grade 2 diastolic dysfunction.  LVEF improved to 60-65% on echo 9/021.  He was seen in the hospital 09/2019 with chest pain and dizziness.  He was hypotensive to the 22G systolic.  He elected not to participate in cardiac rehab.  He is back working at the [employer].   At his appointment 03/2020 Losartan was added. He was encouraged to increase his exercise. He followed up with our pharmacist, and was started on Carvedilol 3.125 mg given that he had not tolerated beta-blockers in the past. He followed up with our pharmacist 12/2020 and his blood pressure was 160/98 but improved to 138/86 on repeat. He noticed lip tingling, so losartan was reduced and carvedilol was increased. Losartan was increased to 100 mg with his PCP 02/2021. At his last appointment he was feeling well, but concerned about right LE numbness he thought was related to a previous vein harvest 08/2019. On his follow-up  with our pharmacist 02/19/2021, he reported having COVID 3 weeks prior.  His blood pressure was 132/88 at that time.  Today, he is feeling well overall. He plans to obtain a new blood pressure cuff as his is malfunctioning. Lately he is forgetting his nighttime doses of his medication frequently. He has been exercising in the fitness center at the club. He is also walking regularly without any chest pains. Recently he was in [place] and noted being able to walk and keep up with his sons. He continues to feel some numbness in his right LE, but has no pain while walking. Last week he was taking 1 whole tablet of spironolactone due to a misunderstanding with his regimen. He notes he is only taking rosuvastatin, not taking atorvastatin. About 2 months ago he was infected with COVID, but only experienced minor symptoms including fatigue. He denies any palpitations, chest pain, or shortness of breath. No lightheadedness, headaches, syncope, orthopnea, PND, lower extremity edema or exertional symptoms.   Past Medical History:  Diagnosis Date   Aneurysm of ascending aorta 09/24/2020   4.0 cm on echo in 2021.  Will repeat on follow-up.   Anxiety    ASTHMA 10/28/2006   Chronic combined systolic and diastolic heart failure ([place]) 09/24/2020   Coronary artery disease    Cough    Diabetes mellitus type II 2000   age 58   GERD (gastroesophageal reflux disease)    Hyperlipidemia    Hypertension 1995   Hypothyroidism 2002   thyroidectomy for Goiter   Lumbar back pain    NASH (nonalcoholic  steatohepatitis)    Numbness    OSA (obstructive sleep apnea) 11/19/2010   Sleep apnea    Pt states mild case, doesn't use cpap    Past Surgical History:  Procedure Laterality Date   bilateral shoulder surgery     CARDIAC CATHETERIZATION     CORONARY ARTERY BYPASS GRAFT N/A 08/27/2019   Procedure: CORONARY ARTERY BYPASS GRAFTING (CABG) x7, LIMA TO LAD, SVG TO DIAG 1 WITH SEQUENTIAL SVG TO DIAG 2, SVG TO OM2, SVG TO PDA  WITH SEQUENTIAL SVG TO PLB WITH SEQUENTIAL SVG TO OTHER.;  Surgeon: Gaye Pollack, MD;  Location: [place] OR;  Service: Open Heart Surgery;  Laterality: N/A;   ENDOVEIN HARVEST OF GREATER SAPHENOUS VEIN Right 08/27/2019   Procedure: [place] Of Greater Saphenous Vein;  Surgeon: Gaye Pollack, MD;  Location: [facility] OR;  Service: Open Heart Surgery;  Laterality: Right;   LEFT HEART CATH AND CORONARY ANGIOGRAPHY N/A 08/26/2019   Procedure: LEFT HEART CATH AND CORONARY ANGIOGRAPHY;  Surgeon: Nelva Bush, MD;  Location: [place] [facility] LAB;  Service: Cardiovascular;  Laterality: N/A;   right knee repair  1997   TEE WITHOUT CARDIOVERSION N/A 08/27/2019   Procedure: TRANSESOPHAGEAL ECHOCARDIOGRAM (TEE);  Surgeon: Gaye Pollack, MD;  Location: [place];  Service: Open Heart Surgery;  Laterality: N/A;   THYROIDECTOMY     right   TRIGGER FINGER RELEASE     TRIGGER FINGER RELEASE  02/23/2011   Procedure: RELEASE TRIGGER FINGER/A-1 PULLEY;  Surgeon: Meredith Pel;  Location: [place];  Service: Orthopedics;  Laterality: Right;  Right 4th trigger finger release     Current Outpatient Medications  Medication Sig Dispense Refill   aspirin EC 81 MG tablet Take 81 mg by mouth daily. Swallow whole.     blood glucose meter kit and supplies KIT Dispense based on patient and insurance preference. Use up to 3 times daily as directed. (FOR ICD-10-E11.9 and Z79.4). (Patient taking differently: Inject 1 each into the skin See admin instructions. Dispense based on patient and insurance preference. Use up to 3 times daily as directed. (FOR ICD-10-E11.9 and Z79.4).) 1 each 0   carvedilol (COREG) 6.25 MG tablet Take 1 tablet (6.25 mg total) by mouth 2 (two) times daily. 180 tablet 1   Cholecalciferol 50 MCG (2000 UT) CAPS 1 tablet     citalopram (CELEXA) 40 MG tablet Take  1 tablet  Daily  for Mood & Anxiety 90 tablet 1   Continuous Blood Gluc Sensor (FREESTYLE LIBRE 2 SENSOR) MISC Please supply one box and 6  refills. Use as directed (Patient taking differently: 1 each by Other route See admin instructions. Please supply one box and 6 refills. Use as directed) 1 each 6   empagliflozin (JARDIANCE) 25 MG TABS tablet Take 1 tablet by mouth daily.     levothyroxine (SYNTHROID) 137 MCG tablet 1 TAB ON EMPTY STOMACH ONLY WATER FOR 30 MINS.NO ANTACIDS, CALC, MAGN, BIOTIN FOR 4 HRS 30 tablet 2   losartan (COZAAR) 100 MG tablet Take 100 mg by mouth daily. 90 tablet 3   metFORMIN (GLUCOPHAGE-XR) 500 MG 24 hr tablet Take 2 tablets 2 x  /day with Meals for Diabetes (Patient taking differently: Take 1,000 mg by mouth 2 (two) times daily with a meal.) 360 tablet 0   omeprazole (PRILOSEC) 40 MG capsule TAKE ONE CAPSULE BY MOUTH ONE TIME DAILY TO PREVENT INDIGESTION AND HEARTBURN 90 capsule 0   rosuvastatin (CRESTOR) 40 MG tablet Take  1 tablet  Daily  for Cholesterol 90 tablet 1   Semaglutide,0.25 or 0.5MG /DOS, (OZEMPIC, 0.25 OR 0.5 MG/DOSE,) 2 MG/1.5ML SOPN Inject 0.5 mg into the skin once a week.     vitamin C (ASCORBIC ACID) 500 MG tablet Take 500 mg by mouth daily.     spironolactone (ALDACTONE) 25 MG tablet Take 1 tablet (25 mg total) by mouth daily. 90 tablet 3   No current facility-administered medications for this visit.    Allergies:   Patient has no known allergies.    Social History:  The patient  reports that he has never smoked. He has never used smokeless tobacco. He reports current alcohol use. He reports current drug use. Drug: Marijuana.   Family History:  The patient's family history includes Breast cancer in his mother; Heart attack in his father; Osteoarthritis in his father; Skin cancer in his father.    ROS:   Please see the history of present illness. (+) Right LE numbness All other systems are reviewed and negative.    PHYSICAL EXAM: VS:  BP 138/82 (BP Location: Right Arm, Patient Position: Sitting, Cuff Size: Normal)    Pulse 80    Ht 5' 10.5" (1.791 m)    Wt 211 lb 11.2 oz (96 kg)     SpO2 98%    BMI 29.95 kg/m  , BMI Body mass index is 29.95 kg/m. GENERAL:  Well appearing HEENT:  Pupils equal round and reactive, fundi not visualized, oral mucosa unremarkable NECK:  No jugular venous distention, waveform within normal limits, carotid upstroke brisk and symmetric, no bruits LUNGS:  Clear to auscultation bilaterally HEART:  RRR.  PMI not displaced or sustained,S1 and S2 within normal limits, no S3, no S4, no clicks, no rubs, no murmurs ABD:  Flat, positive bowel sounds normal in frequency in pitch, no bruits, no rebound, no guarding, no midline pulsatile mass, no hepatomegaly, no splenomegaly EXT:  2 plus pulses throughout, no edema, no cyanosis no clubbing SKIN:  No rashes no nodules NEURO:  Cranial nerves II through XII grossly intact, motor grossly intact throughout PSYCH:  Cognitively intact, oriented to person place and time   EKG:   03/22/2021: EKG was not ordered. 09/24/2020: EKG was not ordered. 03/23/2020: EKG was not ordered. 02/10/2020: sinus rhythm.  Rate 88 bpm.  Echo 12/2019:  1. Left ventricular ejection fraction, by estimation, is 55 to 60%. The  left ventricle has normal function. The left ventricle has no regional  wall motion abnormalities. Left ventricular diastolic parameters are  consistent with Grade I diastolic  dysfunction (impaired relaxation).   2. Right ventricular systolic function is mildly reduced. The right  ventricular size is normal. Tricuspid regurgitation signal is inadequate  for assessing PA pressure.   3. The mitral valve is normal in structure. Trivial mitral valve  regurgitation. No evidence of mitral stenosis.   4. The aortic valve is tricuspid. Aortic valve regurgitation is not  visualized. No aortic stenosis is present.   5. Aortic dilatation noted. There is mild dilatation of the aortic root,  measuring 40 mm.   6. The inferior vena cava is normal in size with greater than 50%  respiratory variability, suggesting right  atrial pressure of 3 mmHg.    Echo 08/26/19:  1. Left ventricular ejection fraction, by estimation, is 40 to 45%. The  left ventricle has mildly decreased function. The left ventricle  demonstrates regional wall motion abnormalities (see scoring  diagram/findings for description). Left ventricular  diastolic parameters are consistent with Grade  II diastolic dysfunction  (pseudonormalization). There is moderate hypokinesis of the left  ventricular, mid-apical anteroseptal wall.   2. Right ventricular systolic function is normal. The right ventricular  size is normal.   3. The mitral valve is normal in structure. Mild mitral valve  regurgitation. No evidence of mitral stenosis.   4. The aortic valve is normal in structure. Aortic valve regurgitation is  not visualized. No aortic stenosis is present.   LHC 08/26/19: Diagnostic Dominance: Right    Conclusions: Severe multivessel coronary artery disease, including 90% distal LMCA stenosis, as well as 70-80% lesions involving the mid LAD, D1, large OM1, and rPL branches. Mildly to moderately reduced left ventricular contraction (LVEF ~45%) with anterior hypokinesis.  Upper normal left ventricular filling pressure.   Carotid Doppler 08/26/19: Right Carotid: Velocities in the right ICA are consistent with a 40-59%                 stenosis.   Left Carotid: Velocities in the left ICA are consistent with a 1-39%  stenosis.  Vertebrals:  Bilateral vertebral arteries demonstrate antegrade flow.  Subclavians: Normal flow hemodynamics were seen in bilateral subclavian               arteries.  ABIs: Right ABI: Resting right ankle-brachial index is within normal range. No  evidence of significant right lower extremity arterial disease.  Left ABI: Resting left ankle-brachial index is within normal range. No  evidence of significant left lower extremity arterial disease.  Left Upper Extremity: Doppler waveforms decrease >50% with left radial   compression. Doppler waveform obliterate with left ulnar compression.      Recent Labs: 07/13/2020: Magnesium 2.2 02/08/2021: ALT 29; BUN 20; Creat 1.04; Hemoglobin 14.9; Platelets 205; Potassium 4.0; Sodium 138; TSH 3.79    Lipid Panel    Component Value Date/Time   CHOL 145 02/08/2021 1500   TRIG 89 02/08/2021 1500   HDL 63 02/08/2021 1500   CHOLHDL 2.3 02/08/2021 1500   VLDL 23 08/26/2019 0749   LDLCALC 65 02/08/2021 1500   LDLDIRECT 120.3 11/07/2006 1321      Wt Readings from Last 3 Encounters:  03/22/21 211 lb 11.2 oz (96 kg)  02/19/21 211 lb 9.6 oz (96 kg)  02/08/21 213 lb 3.2 oz (96.7 kg)      ASSESSMENT AND PLAN:  Essential hypertension Blood pressure remains above goal.  He should be less than 130/80.  Continue carvedilol and losartan.  Increase spironolactone to 25 mg and check a basic metabolic panel in a week.  Aneurysm of ascending aorta (HCC) Ascending aortic aneurysm 4.0 cm on echo in 2021.  Blood pressure control as above.  Repeat echo.  Hyperlipidemia associated with type 2 diabetes mellitus ([place]) Lipids are well controlled.  Continue rosuvastatin.  LDL was 65 on 02/2021.  He had good improvement in his triglycerides as well.  S/P CABG x 7 on 08/27/19 He is doing well and has no angina.  Lipids are well controlled.  Continue aspirin, rosuvastatin, and carvedilol.  He is doing well on Ozempic and Jardiance for his diabetes.  He suspects that they will increase his dose of Ozempic to follow-up with his PCP.    Current medicines are reviewed at length with the patient today.  The patient does not have concerns regarding medicines.  The following changes have been made:  increase losartan  Labs/ tests ordered today include:   Orders Placed This Encounter  Procedures   ECHOCARDIOGRAM COMPLETE  Disposition:   F/u with Jayvyn Haselton C. Oval Linsey, MD, [facility] in 3 months.   I,Mathew Stumpf,acting as a Education administrator for Skeet Latch, MD.,have documented all  relevant documentation on the behalf of Skeet Latch, MD,as directed by  Skeet Latch, MD while in the presence of Skeet Latch, MD.  I, [place] Oval Linsey, MD have reviewed all documentation for this visit.  The documentation of the exam, diagnosis, procedures, and orders on 03/22/2021 are all accurate and complete.   Signed, Emeka Lindner C. Oval Linsey, MD, [facility]  03/22/2021 1:47 PM    [facility]

## 2021-03-22 NOTE — Assessment & Plan Note (Signed)
He is doing well and has no angina.  Lipids are well controlled.  Continue aspirin, rosuvastatin, and carvedilol.  He is doing well on Ozempic and Jardiance for his diabetes.  He suspects that they will increase his dose of Ozempic to follow-up with his PCP.

## 2021-03-22 NOTE — Assessment & Plan Note (Signed)
Lipids are well controlled.  Continue rosuvastatin.  LDL was 65 on 02/2021.  He had good improvement in his triglycerides as well.

## 2021-03-22 NOTE — Assessment & Plan Note (Signed)
Blood pressure remains above goal.  He should be less than 130/80.  Continue carvedilol and losartan.  Increase spironolactone to 25 mg and check a basic metabolic panel in a week.

## 2021-03-22 NOTE — Patient Instructions (Addendum)
Medication Instructions:  INCREASE- Spironolactone 25 mg by mouth daily  *If you need a refill on your cardiac medications before your next appointment, please call your pharmacy*   Lab Work: None Ordered   Testing/Procedures: Your physician has requested that you have an echocardiogram. Echocardiography is a painless test that uses sound waves to create images of your heart. It provides your doctor with information about the size and shape of your heart and how well your hearts chambers and valves are working. This procedure takes approximately one hour. There are no restrictions for this procedure.   Follow-Up: At [facility], you and your health needs are our priority.  As part of our continuing mission to provide you with exceptional heart care, we have created designated Provider Care Teams.  These Care Teams include your primary Cardiologist (physician) and Advanced Practice Providers (APPs -  Physician Assistants and Nurse Practitioners) who all work together to provide you with the care you need, when you need it.  We recommend signing up for the patient portal called "MyChart".  Sign up information is provided on this After Visit Summary.  MyChart is used to connect with patients for Virtual Visits (Telemedicine).  Patients are able to view lab/test results, encounter notes, upcoming appointments, etc.  Non-urgent messages can be sent to your provider as well.   To learn more about what you can do with MyChart, go to [email or web address].    Your next appointment:   3 month(s)  The format for your next appointment:   In Person  Provider:   Skeet Latch, MD

## 2021-04-06 ENCOUNTER — Ambulatory Visit: Payer: Managed Care, Other (non HMO) | Admitting: Pharmacist Clinician (PhC)/ Clinical Pharmacy Specialist

## 2021-04-06 ENCOUNTER — Other Ambulatory Visit: Payer: Self-pay

## 2021-04-06 DIAGNOSIS — I5042 Chronic combined systolic (congestive) and diastolic (congestive) heart failure: Secondary | ICD-10-CM | POA: Diagnosis not present

## 2021-04-06 MED ORDER — CARVEDILOL 12.5 MG PO TABS: 12.5000 mg | ORAL_TABLET | Freq: Two times a day (BID) | ORAL | 3 refills | Status: DC

## 2021-04-06 NOTE — Patient Instructions (Signed)
Return for a a follow up appointment Tuesday Feb 7 at 1:30 pm  Check your blood pressure at home daily and keep record of the readings.  Take your BP meds as follows:  Increase carvedilol to 12.5 mg twice daily (you can take 2 of the 6.25 mg tablets twice daily until gone)  Bring all of your meds, your BP cuff and your record of home blood pressures to your next appointment.  Exercise as youre able, try to walk approximately 30 minutes per day.  Keep salt intake to a minimum, especially watch canned and prepared boxed foods.  Eat more fresh fruits and vegetables and fewer canned items.  Avoid eating in fast food restaurants.    HOW TO TAKE YOUR BLOOD PRESSURE: Rest 5 minutes before taking your blood pressure.  Dont smoke or drink caffeinated beverages for at least 30 minutes before. Take your blood pressure before (not after) you eat. Sit comfortably with your back supported and both feet on the floor (dont cross your legs). Elevate your arm to heart level on a table or a desk. Use the proper sized cuff. It should fit smoothly and snugly around your bare upper arm. There should be enough room to slip a fingertip under the cuff. The bottom edge of the cuff should be 1 inch above the crease of the elbow. Ideally, take 3 measurements at one sitting and record the average.

## 2021-04-06 NOTE — Progress Notes (Signed)
04/06/2021 [name] [name] 1965-08-11 [id number]   HPI:  [name] [name] is a 56 y.o. male patient of Dr Oval Linsey, with a PMH below who presents today hypertension follow up.   He was seen by Dr. Oval Linsey back in November at which time his pressure was noted to be 160/104.  At that time she did not give him any medications, as his pressure had been 675-449 systolic at previous visits.  He was asked to monitor and work on lifestyle modifications.  He then saw his PCP in December and when pressure was still elevated he was started on losartan 50 mg daily.  Patient notes that prior to his CABG he had been on losartan hctz, however his pressure was low/normal after discharge and the medication was discontinued.  On discharge he was instead given metoprolol 25 mg bid, but with his soft pressure he became hypotensive and this was discontinued shortly thereafter.   Today he is in the office for follow up.  Has been feeling well overall, notes that his weight is down from 234 at time of MI.  On Ozempic and doing well, but has not gone higher than the 0.5 mg dose.    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  DM2 A1c 7.3  - on Jardiance 25 mg qd, metformin 1g mbid, Ozempic 0.5 mg qw  hyperlipidemia LDL 52 on rosuvastatin 40 mg  hypothyroid TSH 1.47  On levothyroxine 137 mcg  OSA Mild, does not use CPAP                 Blood Pressure Goal:  130/80  Current Medications: losartan 100 mg qd, carvedilol 6.25 mg bid, spironolactone 25 mg qd, empagliflozin 25 mg qd  Family Hx: father had MI at 14, just died recently at 66; mother doing well at 55; sister died due to suspected MI (sudden death in her sleep) at 42; 2 kids 16,30, older son with hypertension   Social Hx: no, occasional beer, coffee about 2 cups per day, no caffeine sodas   Diet: eats out more recently, he and wife both work so not home until dinner time.  Tries to avoid fried foods    Exercise: golf every week or two,  working at [employer] now   Home BP readings: has not been checking - has a wrist cuff at home, but unsure if works correctly   Intolerances: nkda   Labs:  11/22 Na 138, K 4.0, Glu 221, BUN 20, SCr 1.04, GFR 84   Wt Readings from Last 3 Encounters:  04/06/21 210 lb 6.4 oz (95.4 kg)  03/22/21 211 lb 11.2 oz (96 kg)  02/19/21 211 lb 9.6 oz (96 kg)   BP Readings from Last 3 Encounters:  04/06/21 132/80  03/22/21 138/82  02/19/21 132/88   Pulse Readings from Last 3 Encounters:  04/06/21 83  03/22/21 80  02/19/21 78    Current Outpatient Medications  Medication Sig Dispense Refill   aspirin EC 81 MG tablet Take 81 mg by mouth daily. Swallow whole.     blood glucose meter kit and supplies KIT Dispense based on patient and insurance preference. Use up to 3 times daily as directed. (FOR ICD-10-E11.9 and Z79.4). (Patient taking differently: Inject 1 each into the skin See admin instructions. Dispense based on patient and insurance preference. Use up to 3 times daily as directed. (FOR ICD-10-E11.9 and Z79.4).) 1 each 0   carvedilol (COREG) 12.5 MG tablet Take 1 tablet (12.5  mg total) by mouth 2 (two) times daily. 180 tablet 3   Cholecalciferol 50 MCG (2000 UT) CAPS 1 tablet     citalopram (CELEXA) 40 MG tablet Take  1 tablet  Daily  for Mood & Anxiety 90 tablet 1   Continuous Blood Gluc Sensor (FREESTYLE LIBRE 2 SENSOR) MISC Please supply one box and 6 refills. Use as directed (Patient taking differently: 1 each by Other route See admin instructions. Please supply one box and 6 refills. Use as directed) 1 each 6   empagliflozin (JARDIANCE) 25 MG TABS tablet Take 1 tablet by mouth daily.     levothyroxine (SYNTHROID) 137 MCG tablet 1 TAB ON EMPTY STOMACH ONLY WATER FOR 30 MINS.NO ANTACIDS, CALC, MAGN, BIOTIN FOR 4 HRS 30 tablet 2   losartan (COZAAR) 100 MG tablet Take 100 mg by mouth daily. 90 tablet 3   metFORMIN (GLUCOPHAGE-XR) 500 MG 24 hr tablet Take 2 tablets 2 x  /day with Meals for  Diabetes (Patient taking differently: Take 1,000 mg by mouth 2 (two) times daily with a meal.) 360 tablet 0   omeprazole (PRILOSEC) 40 MG capsule TAKE ONE CAPSULE BY MOUTH ONE TIME DAILY TO PREVENT INDIGESTION AND HEARTBURN 90 capsule 0   rosuvastatin (CRESTOR) 40 MG tablet Take  1 tablet  Daily  for Cholesterol 90 tablet 1   Semaglutide,0.25 or 0.5MG /DOS, (OZEMPIC, 0.25 OR 0.5 MG/DOSE,) 2 MG/1.5ML SOPN Inject 0.5 mg into the skin once a week.     spironolactone (ALDACTONE) 25 MG tablet Take 1 tablet (25 mg total) by mouth daily. 90 tablet 3   vitamin C (ASCORBIC ACID) 500 MG tablet Take 500 mg by mouth daily.     No current facility-administered medications for this visit.    No Known Allergies  Past Medical History:  Diagnosis Date   Aneurysm of ascending aorta 09/24/2020   4.0 cm on echo in 2021.  Will repeat on follow-up.   Anxiety    ASTHMA 10/28/2006   Chronic combined systolic and diastolic heart failure ([place]) 09/24/2020   Coronary artery disease    Cough    Diabetes mellitus type II 2000   age 56   GERD (gastroesophageal reflux disease)    Hyperlipidemia    Hypertension 1995   Hypothyroidism 2002   thyroidectomy for Goiter   Lumbar back pain    NASH (nonalcoholic steatohepatitis)    Numbness    OSA (obstructive sleep apnea) 11/19/2010   Sleep apnea    Pt states mild case, doesn't use cpap    Blood pressure 132/80, pulse 83, resp. rate 14, height 5\' 10"  (1.778 m), weight 210 lb 6.4 oz (95.4 kg), SpO2 97 %.  Chronic combined systolic and diastolic heart failure ([place]) Patient with HF and hypertension, now doing well, mostly at BP goal.  Is on GDMT for HF.  Will increase carvedilol to 12.5 mg bid and continue with all other medications.  We will see him back in one month for follow up.    Tommy Medal PharmD CPP [facility] [address] [place], [place] [address] [phone]

## 2021-04-06 NOTE — Assessment & Plan Note (Signed)
Patient with HF and hypertension, now doing well, mostly at BP goal.  Is on GDMT for HF.  Will increase carvedilol to 12.5 mg bid and continue with all other medications.  We will see him back in one month for follow up.

## 2021-04-07 ENCOUNTER — Other Ambulatory Visit (HOSPITAL_BASED_OUTPATIENT_CLINIC_OR_DEPARTMENT_OTHER): Payer: Managed Care, Other (non HMO)

## 2021-04-07 LAB — BASIC METABOLIC PANEL
BUN/Creatinine Ratio: 24 — ABNORMAL HIGH (ref 9–20)
BUN: 22 mg/dL (ref 6–24)
CO2: 23 mmol/L (ref 20–29)
Calcium: 9.6 mg/dL (ref 8.7–10.2)
Chloride: 102 mmol/L (ref 96–106)
Creatinine, Ser: 0.91 mg/dL (ref 0.76–1.27)
Glucose: 161 mg/dL — ABNORMAL HIGH (ref 70–99)
Potassium: 4.8 mmol/L (ref 3.5–5.2)
Sodium: 143 mmol/L (ref 134–144)
eGFR: 98 mL/min/{1.73_m2} (ref >59)

## 2021-04-08 ENCOUNTER — Other Ambulatory Visit (HOSPITAL_BASED_OUTPATIENT_CLINIC_OR_DEPARTMENT_OTHER): Payer: Managed Care, Other (non HMO)

## 2021-04-15 ENCOUNTER — Telehealth (HOSPITAL_BASED_OUTPATIENT_CLINIC_OR_DEPARTMENT_OTHER): Payer: Self-pay | Admitting: Cardiovascular Disease

## 2021-04-15 NOTE — Telephone Encounter (Signed)
Left message 04/19/21--04/13/21 and 04/15/21 for patient to call and discuss rescheduling the Echo ordered by Dr. Oval Linsey

## 2021-04-19 ENCOUNTER — Encounter (HOSPITAL_BASED_OUTPATIENT_CLINIC_OR_DEPARTMENT_OTHER): Payer: Self-pay | Admitting: Cardiovascular Disease

## 2021-04-28 ENCOUNTER — Other Ambulatory Visit: Payer: Self-pay

## 2021-04-28 ENCOUNTER — Ambulatory Visit (INDEPENDENT_AMBULATORY_CARE_PROVIDER_SITE_OTHER): Payer: Managed Care, Other (non HMO)

## 2021-04-28 DIAGNOSIS — I7121 Aneurysm of the ascending aorta, without rupture: Secondary | ICD-10-CM | POA: Diagnosis not present

## 2021-04-28 LAB — ECHOCARDIOGRAM COMPLETE
AR max vel: 3.32 cm2
AV Area VTI: 3.06 cm2
AV Area mean vel: 2.87 cm2
AV Mean grad: 3 mmHg
AV Peak grad: 5.3 mmHg
Ao pk vel: 1.16 m/s
Area-P 1/2: 4.26 cm2
Calc EF: 61.3 %
S' Lateral: 2.84 cm
Single Plane A2C EF: 61.4 %
Single Plane A4C EF: 61.1 %

## 2021-05-11 ENCOUNTER — Ambulatory Visit: Payer: Managed Care, Other (non HMO)

## 2021-05-11 ENCOUNTER — Telehealth: Payer: Self-pay

## 2021-05-11 NOTE — Progress Notes (Deleted)
05/11/2021 JOB HOLTSCLAW [birth year] [id number]   HPI:  [name] [name] is a 56 y.o. male patient of Dr Oval Linsey, with a PMH below who presents today hypertension follow up.   He was seen by Dr. Oval Linsey back in November at which time his pressure was noted to be 160/104.  At that time she did not give him any medications, as his pressure had been 209-470 systolic at previous visits.  He was asked to monitor and work on lifestyle modifications.  He then saw his PCP in December and when pressure was still elevated he was started on losartan 50 mg daily.  Patient notes that prior to his CABG he had been on losartan hctz, however his pressure was low/normal after discharge and the medication was discontinued.  On discharge he was instead given metoprolol 25 mg bid, but with his soft pressure he became hypotensive and this was discontinued shortly thereafter.   Today he is in the office for follow up.  Has been feeling well overall, notes that his weight is down from 234 at time of MI.  On Ozempic and doing well, but has not gone higher than the 0.5 mg dose.    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  DM2 A1c 7.3  - on Jardiance 25 mg qd, metformin 1g mbid, Ozempic 0.5 mg qw  hyperlipidemia LDL 52 on rosuvastatin 40 mg  hypothyroid TSH 1.47  On levothyroxine 137 mcg  OSA Mild, does not use CPAP                 Blood Pressure Goal:  130/80  Current Medications: losartan 100 mg qd, carvedilol 12.5 mg bid, spironolactone 25 mg qd, empagliflozin 25 mg qd  Family Hx: father had MI at 56, just died recently at 82; mother doing well at 72; sister died due to suspected MI (sudden death in her sleep) at 62; 2 kids 54,30, older son with hypertension   Social Hx: no, occasional beer, coffee about 2 cups per day, no caffeine sodas   Diet: eats out more recently, he and wife both work so not home until dinner time.  Tries to avoid fried foods    Exercise: golf every week or two,  working at [employer] now   Home BP readings: has not been checking - has a wrist cuff at home, but unsure if works correctly   Intolerances: nkda  Labs:   1/23: Na 143, K 4.8, Glu 161, BUN 22, SCr 0.91 GFR 98 11/22 Na 138, K 4.0, Glu 221, BUN 20, SCr 1.04, GFR 84   Wt Readings from Last 3 Encounters:  04/06/21 210 lb 6.4 oz (95.4 kg)  03/22/21 211 lb 11.2 oz (96 kg)  02/19/21 211 lb 9.6 oz (96 kg)   BP Readings from Last 3 Encounters:  04/06/21 132/80  03/22/21 138/82  02/19/21 132/88   Pulse Readings from Last 3 Encounters:  04/06/21 83  03/22/21 80  02/19/21 78    Current Outpatient Medications  Medication Sig Dispense Refill   aspirin EC 81 MG tablet Take 81 mg by mouth daily. Swallow whole.     blood glucose meter kit and supplies KIT Dispense based on patient and insurance preference. Use up to 3 times daily as directed. (FOR ICD-10-E11.9 and Z79.4). (Patient taking differently: Inject 1 each into the skin See admin instructions. Dispense based on patient and insurance preference. Use up to 3 times daily as directed. (FOR ICD-10-E11.9 and Z79.4).) 1  each 0   carvedilol (COREG) 12.5 MG tablet Take 1 tablet (12.5 mg total) by mouth 2 (two) times daily. 180 tablet 3   Cholecalciferol 50 MCG (2000 UT) CAPS 1 tablet     citalopram (CELEXA) 40 MG tablet Take  1 tablet  Daily  for Mood & Anxiety 90 tablet 1   Continuous Blood Gluc Sensor (FREESTYLE LIBRE 2 SENSOR) MISC Please supply one box and 6 refills. Use as directed (Patient taking differently: 1 each by Other route See admin instructions. Please supply one box and 6 refills. Use as directed) 1 each 6   empagliflozin (JARDIANCE) 25 MG TABS tablet Take 1 tablet by mouth daily.     levothyroxine (SYNTHROID) 137 MCG tablet 1 TAB ON EMPTY STOMACH ONLY WATER FOR 30 MINS.NO ANTACIDS, CALC, MAGN, BIOTIN FOR 4 HRS 30 tablet 2   losartan (COZAAR) 100 MG tablet Take 100 mg by mouth daily. 90 tablet 3   metFORMIN (GLUCOPHAGE-XR) 500 MG  24 hr tablet Take 2 tablets 2 x  /day with Meals for Diabetes (Patient taking differently: Take 1,000 mg by mouth 2 (two) times daily with a meal.) 360 tablet 0   omeprazole (PRILOSEC) 40 MG capsule TAKE ONE CAPSULE BY MOUTH ONE TIME DAILY TO PREVENT INDIGESTION AND HEARTBURN 90 capsule 0   rosuvastatin (CRESTOR) 40 MG tablet Take  1 tablet  Daily  for Cholesterol 90 tablet 1   Semaglutide,0.25 or 0.5MG/DOS, (OZEMPIC, 0.25 OR 0.5 MG/DOSE,) 2 MG/1.5ML SOPN Inject 0.5 mg into the skin once a week.     spironolactone (ALDACTONE) 25 MG tablet Take 1 tablet (25 mg total) by mouth daily. 90 tablet 3   vitamin C (ASCORBIC ACID) 500 MG tablet Take 500 mg by mouth daily.     No current facility-administered medications for this visit.    No Known Allergies  Past Medical History:  Diagnosis Date   Aneurysm of ascending aorta 09/24/2020   4.0 cm on echo in 2021.  Will repeat on follow-up.   Anxiety    ASTHMA 10/28/2006   Chronic combined systolic and diastolic heart failure ([place]) 09/24/2020   Coronary artery disease    Cough    Diabetes mellitus type II 2000   age 64   GERD (gastroesophageal reflux disease)    Hyperlipidemia    Hypertension 1995   Hypothyroidism 2002   thyroidectomy for Goiter   Lumbar back pain    NASH (nonalcoholic steatohepatitis)    Numbness    OSA (obstructive sleep apnea) 11/19/2010   Sleep apnea    Pt states mild case, doesn't use cpap    There were no vitals taken for this visit.  No problem-specific Assessment & Plan notes found for this encounter.    Tommy Medal PharmD CPP [facility] [address] [place], [place] [address] [phone]

## 2021-05-11 NOTE — Telephone Encounter (Signed)
Lmom to r/s missed appt  

## 2021-05-23 ENCOUNTER — Other Ambulatory Visit: Payer: Self-pay | Admitting: Nurse Practitioner

## 2021-05-24 ENCOUNTER — Encounter: Payer: Managed Care, Other (non HMO) | Admitting: Internal Medicine

## 2021-06-02 ENCOUNTER — Telehealth (HOSPITAL_BASED_OUTPATIENT_CLINIC_OR_DEPARTMENT_OTHER): Payer: Self-pay

## 2021-06-02 NOTE — Telephone Encounter (Addendum)
Called results to patient and left results on VM (ok per DPR), instructions left to call office back if patient has any questions!  ? ? ? ?----- Message from Skeet Latch, MD sent at 05/25/2021  5:09 PM EST ----- ?Normal kidney function and electrolytes.  Blood glucose is too high. ?

## 2021-06-10 ENCOUNTER — Ambulatory Visit (AMBULATORY_SURGERY_CENTER): Payer: Managed Care, Other (non HMO) | Admitting: *Deleted

## 2021-06-10 ENCOUNTER — Other Ambulatory Visit: Payer: Self-pay

## 2021-06-10 VITALS — Ht 70.0 in | Wt 198.0 lb

## 2021-06-10 DIAGNOSIS — Z86010 Personal history of colonic polyps: Secondary | ICD-10-CM

## 2021-06-10 MED ORDER — NA SULFATE-K SULFATE-MG SULF 17.5-3.13-1.6 GM/177ML PO SOLN: 2.0000 | Freq: Once | ORAL | 0 refills | Status: AC

## 2021-06-10 NOTE — Progress Notes (Signed)

## 2021-06-16 ENCOUNTER — Encounter: Payer: Self-pay | Admitting: Gastroenterology

## 2021-06-24 ENCOUNTER — Encounter: Payer: Self-pay | Admitting: Gastroenterology

## 2021-06-24 ENCOUNTER — Ambulatory Visit (AMBULATORY_SURGERY_CENTER): Payer: Managed Care, Other (non HMO) | Admitting: Gastroenterology

## 2021-06-24 ENCOUNTER — Other Ambulatory Visit: Payer: Self-pay

## 2021-06-24 VITALS — BP 108/73 | HR 86 | Temp 96.8°F | Resp 15 | Ht 70.0 in | Wt 198.0 lb

## 2021-06-24 DIAGNOSIS — D123 Benign neoplasm of transverse colon: Secondary | ICD-10-CM | POA: Diagnosis not present

## 2021-06-24 DIAGNOSIS — D122 Benign neoplasm of ascending colon: Secondary | ICD-10-CM

## 2021-06-24 DIAGNOSIS — D120 Benign neoplasm of cecum: Secondary | ICD-10-CM

## 2021-06-24 DIAGNOSIS — D124 Benign neoplasm of descending colon: Secondary | ICD-10-CM | POA: Diagnosis not present

## 2021-06-24 DIAGNOSIS — Z86010 Personal history of colonic polyps: Secondary | ICD-10-CM | POA: Diagnosis not present

## 2021-06-24 LAB — HM COLONOSCOPY

## 2021-06-24 MED ORDER — SODIUM CHLORIDE 0.9 % IV SOLN: 500.0000 mL | Freq: Once | INTRAVENOUS | Status: DC

## 2021-06-24 NOTE — Patient Instructions (Signed)
Handouts on polyps, diverticulosis, and hemorrhoids given to you today  Await pathology results     YOU HAD AN ENDOSCOPIC PROCEDURE TODAY AT THE [facility]:   Refer to the procedure report that was given to you for any specific questions about what was found during the examination.  If the procedure report does not answer your questions, please call your gastroenterologist to clarify.  If you requested that your care partner not be given the details of your procedure findings, then the procedure report has been included in a sealed envelope for you to review at your convenience later.  YOU SHOULD EXPECT: Some feelings of bloating in the abdomen. Passage of more gas than usual.  Walking can help get rid of the air that was put into your GI tract during the procedure and reduce the bloating. If you had a lower endoscopy (such as a colonoscopy or flexible sigmoidoscopy) you may notice spotting of blood in your stool or on the toilet paper. If you underwent a bowel prep for your procedure, you may not have a normal bowel movement for a few days.  Please Note:  You might notice some irritation and congestion in your nose or some drainage.  This is from the oxygen used during your procedure.  There is no need for concern and it should clear up in a day or so.  SYMPTOMS TO REPORT IMMEDIATELY:   Following lower endoscopy (colonoscopy or flexible sigmoidoscopy):  Excessive amounts of blood in the stool  Significant tenderness or worsening of abdominal pains  Swelling of the abdomen that is new, acute  Fever of 100F or higher  For urgent or emergent issues, a gastroenterologist can be reached at any hour by calling [phone]. Do not use MyChart messaging for urgent concerns.    DIET:  We do recommend a small meal at first, but then you may proceed to your regular diet.  Drink plenty of fluids but you should avoid alcoholic beverages for 24 hours.  ACTIVITY:  You should plan to  take it easy for the rest of today and you should NOT DRIVE or use heavy machinery until tomorrow (because of the sedation medicines used during the test).    FOLLOW UP: Our staff will call the number listed on your records 48-72 hours following your procedure to check on you and address any questions or concerns that you may have regarding the information given to you following your procedure. If we do not reach you, we will leave a message.  We will attempt to reach you two times.  During this call, we will ask if you have developed any symptoms of COVID 19. If you develop any symptoms (ie: fever, flu-like symptoms, shortness of breath, cough etc.) before then, please call [phone].  If you test positive for Covid 19 in the 2 weeks post procedure, please call and report this information to us.    If any biopsies were taken you will be contacted by phone or by letter within the next 1-3 weeks.  Please call us at [phone] if you have not heard about the biopsies in 3 weeks.    SIGNATURES/CONFIDENTIALITY: You and/or your care partner have signed paperwork which will be entered into your electronic medical record.  These signatures attest to the fact that that the information above on your After Visit Summary has been reviewed and is understood.  Full responsibility of the confidentiality of this discharge information lies with you and/or your care-partner. 

## 2021-06-24 NOTE — Progress Notes (Signed)
VS NT ? ?Pt's states no medical or surgical changes since previsit or office visit. ? ?

## 2021-06-24 NOTE — Progress Notes (Signed)
Called to room to assist during endoscopic procedure.  Patient ID and intended procedure confirmed with present staff. Received instructions for my participation in the procedure from the performing physician.  

## 2021-06-24 NOTE — Op Note (Signed)
[name] [name] ?Patient Name: [name] [name] ?Procedure Date: 06/24/2021 7:00 AM ?MRN: [id number] ?Endoscopist: Ladene Artist , MD ?Age: 56 ?Referring MD:  ?Date of Birth: May 26, 1965 ?Gender: Male ?Account #: [id number] ?Procedure:                Colonoscopy ?Indications:              Surveillance: Personal history of adenomatous  ?                          polyps on last colonoscopy > 5 years ago ?Medicines:                Monitored Anesthesia Care ?Procedure:                Pre-Anesthesia Assessment: ?                          - Prior to the procedure, a History and Physical  ?                          was performed, and patient medications and  ?                          allergies were reviewed. The patient's tolerance of  ?                          previous anesthesia was also reviewed. The risks  ?                          and benefits of the procedure and the sedation  ?                          options and risks were discussed with the patient.  ?                          All questions were answered, and informed consent  ?                          was obtained. Prior Anticoagulants: The patient has  ?                          taken no previous anticoagulant or antiplatelet  ?                          agents. ASA Grade Assessment: III - A patient with  ?                          severe systemic disease. After reviewing the risks  ?                          and benefits, the patient was deemed in  ?                          satisfactory condition to undergo the procedure. ?  After obtaining informed consent, the colonoscope  ?                          was passed under direct vision. Throughout the  ?                          procedure, the patient's blood pressure, pulse, and  ?                          oxygen saturations were monitored continuously. The  ?                          CF [id number] #[id number] was introduced through the anus  ?                          and advanced to  the the cecum, identified by  ?                          appendiceal orifice and ileocecal valve. The  ?                          ileocecal valve, appendiceal orifice, and rectum  ?                          were photographed. The quality of the bowel  ?                          preparation was good. The colonoscopy was performed  ?                          without difficulty. The patient tolerated the  ?                          procedure well. ?Scope In: 8:05:40 AM ?Scope Out: 8:28:52 AM ?Scope Withdrawal Time: 0 hours 20 minutes 19 seconds  ?Total Procedure Duration: 0 hours 23 minutes 12 seconds  ?Findings:                 The perianal and digital rectal examinations were  ?                          normal. ?                          Eight sessile polyps were found in the descending  ?                          colon (2), transverse colon (3), ascending colon  ?                          (1) and cecum (2). The polyps were 6 to 8 mm in  ?                          size. These polyps were removed with a cold snare.  ?  Resection and retrieval were complete. ?                          Multiple small-mouthed diverticula were found in  ?                          the left colon. There was no evidence of  ?                          diverticular bleeding. ?                          External hemorrhoids were found during  ?                          retroflexion. The hemorrhoids were small. ?                          The exam was otherwise without abnormality on  ?                          direct and retroflexion views. ?Complications:            No immediate complications. Estimated blood loss:  ?                          None. ?Estimated Blood Loss:     Estimated blood loss: none. ?Impression:               - Eight 6 to 8 mm polyps in the descending colon,  ?                          in the transverse colon, in the ascending colon and  ?                          in the cecum, removed with a cold  snare. Resected  ?                          and retrieved. ?                          - Mild diverticulosis in the left colon. ?                          - External hemorrhoids. ?                          - The examination was otherwise normal on direct  ?                          and retroflexion views. ?Recommendation:           - Repeat colonoscopy, likely 3 years, after studies  ?                          are complete for surveillance of multiple polyps. ?                          -  Patient has a contact number available for  ?                          emergencies. The signs and symptoms of potential  ?                          delayed complications were discussed with the  ?                          patient. Return to normal activities tomorrow.  ?                          Written discharge instructions were provided to the  ?                          patient. ?                          - Resume previous diet. ?                          - Continue present medications. ?                          - Await pathology results. ?Ladene Artist, MD ?06/24/2021 8:34:47 AM ?This report has been signed electronically. ?

## 2021-06-24 NOTE — Progress Notes (Signed)
To pacu, VSS. Report to [email or web address].tb 

## 2021-06-24 NOTE — Progress Notes (Signed)
History & Physical  Primary Care Physician:  Lucky Cowboy, MD Primary Gastroenterologist: Claudette Head, MD  CHIEF COMPLAINT:  Personal history of colon polyps   HPI: [name] [name] is a 56 y.o. male with personal history of adenomatous colon polyps for surveillance colonoscopy.   Past Medical History:  Diagnosis Date   Aneurysm of ascending aorta 09/24/2020   4.0 cm on echo in 2021.  Will repeat on follow-up.   Anxiety    ASTHMA 10/28/2006   Chronic combined systolic and diastolic heart failure (HCC) 09/24/2020   Coronary artery disease    Cough    Diabetes mellitus type II 2000   age 81   GERD (gastroesophageal reflux disease)    Hyperlipidemia    Hypertension 1995   Hypothyroidism 2002   thyroidectomy for Goiter   Lumbar back pain    NASH (nonalcoholic steatohepatitis)    Numbness    OSA (obstructive sleep apnea) 11/19/2010   Sleep apnea    Pt states mild case, doesn't use cpap    Past Surgical History:  Procedure Laterality Date   bilateral shoulder surgery     CARDIAC CATHETERIZATION     CORONARY ARTERY BYPASS GRAFT N/A 08/27/2019   Procedure: CORONARY ARTERY BYPASS GRAFTING (CABG) x7, LIMA TO LAD, SVG TO DIAG 1 WITH SEQUENTIAL SVG TO DIAG 2, SVG TO OM2, SVG TO PDA WITH SEQUENTIAL SVG TO PLB WITH SEQUENTIAL SVG TO OTHER.;  Surgeon: Alleen Borne, MD;  Location: [facility] OR;  Service: Open Heart Surgery;  Laterality: N/A;   ENDOVEIN HARVEST OF GREATER SAPHENOUS VEIN Right 08/27/2019   Procedure: Mack Guise Of Greater Saphenous Vein;  Surgeon: Alleen Borne, MD;  Location: [facility] OR;  Service: Open Heart Surgery;  Laterality: Right;   LEFT HEART CATH AND CORONARY ANGIOGRAPHY N/A 08/26/2019   Procedure: LEFT HEART CATH AND CORONARY ANGIOGRAPHY;  Surgeon: Yvonne Kendall, MD;  Location: [facility] INVASIVE CV LAB;  Service: Cardiovascular;  Laterality: N/A;   right knee repair  1997   TEE WITHOUT CARDIOVERSION N/A 08/27/2019   Procedure: TRANSESOPHAGEAL ECHOCARDIOGRAM  (TEE);  Surgeon: Alleen Borne, MD;  Location[facility] OR;  Service: Open Heart Surgery;  Laterality: N/A;   THYROIDECTOMY     right   TRIGGER FINGER RELEASE     TRIGGER FINGER RELEASE  02/23/2011   Procedure: RELEASE TRIGGER FINGER/A-1 PULLEY;  Surgeon: Cammy Copa;  Location: [facility] OR;  Service: Orthopedics;  Laterality: Right;  Right 4th trigger finger release    Prior to Admission medications   Medication Sig Start Date End Date Taking? Authorizing Provider  aspirin EC 81 MG tablet Take 81 mg by mouth daily. Swallow whole.   Yes [provider]  atorvastatin (LIPITOR) 80 MG tablet Take 80 mg by mouth at bedtime. 04/22/21  Yes [provider]  blood glucose meter kit and supplies KIT Dispense based on patient and insurance preference. Use up to 3 times daily as directed. (FOR ICD-10-E11.9 and Z79.4). Patient taking differently: Inject 1 each into the skin See admin instructions. Dispense based on patient and insurance preference. Use up to 3 times daily as directed. (FOR ICD-10-E11.9 and Z79.4). 06/15/17  Yes Lucky Cowboy, MD  carvedilol (COREG) 12.5 MG tablet Take 1 tablet (12.5 mg total) by mouth 2 (two) times daily. 04/06/21 07/05/21 Yes Chilton Si, MD  Cholecalciferol 50 MCG (2000 UT) CAPS 1 tablet   Yes [provider]  citalopram (CELEXA) 40 MG tablet Take  1 tablet  Daily  for Mood &  Anxiety 11/15/20  Yes Lucky Cowboy, MD  Continuous Blood Gluc Sensor (FREESTYLE LIBRE 2 SENSOR) MISC Please supply one box and 6 refills. Use as directed Patient taking differently: 1 each by Other route See admin instructions. Please supply one box and 6 refills. Use as directed 08/30/19  Yes Joycelyn Man, Donielle M, PA-C  empagliflozin (JARDIANCE) 25 MG TABS tablet Take 1 tablet by mouth daily. 08/04/20  Yes [provider]  levothyroxine (SYNTHROID) 137 MCG tablet 1 TAB ON EMPTY STOMACH ONLY WATER FOR 30 MINS.NO ANTACIDS, CALC, MAGN, BIOTIN FOR 4 HRS 07/20/20  Yes  Lucky Cowboy, MD  losartan (COZAAR) 100 MG tablet Take 100 mg by mouth daily. 12/22/20  Yes Chilton Si, MD  losartan-hydrochlorothiazide (HYZAAR) 100-25 MG tablet Take 1 tablet by mouth every morning. 04/22/21  Yes [provider]  metFORMIN (GLUCOPHAGE-XR) 500 MG 24 hr tablet Take 2 tablets 2 x  /day with Meals for Diabetes Patient taking differently: Take 1,000 mg by mouth 2 (two) times daily with a meal. 07/13/19  Yes Lucky Cowboy, MD  omeprazole (PRILOSEC) 40 MG capsule TAKE ONE CAPSULE BY MOUTH ONE TIME DAILY TO PREVENT INDIGESTION AND HEARTBURN 05/24/21  Yes Revonda Humphrey, NP  Semaglutide,0.25 or 0.5MG /DOS, (OZEMPIC, 0.25 OR 0.5 MG/DOSE,) 2 MG/1.5ML SOPN Inject 0.5 mg into the skin once a week. 12/11/19  Yes [provider]  spironolactone (ALDACTONE) 25 MG tablet Take 1 tablet (25 mg total) by mouth daily. 03/22/21  Yes Chilton Si, MD  rosuvastatin (CRESTOR) 40 MG tablet Take  1 tablet  Daily  for Cholesterol Patient not taking: Reported on 06/10/2021 11/15/20   Lucky Cowboy, MD  vitamin C (ASCORBIC ACID) 500 MG tablet Take 500 mg by mouth daily. Patient not taking: Reported on 06/10/2021    [provider]    Current Outpatient Medications  Medication Sig Dispense Refill   aspirin EC 81 MG tablet Take 81 mg by mouth daily. Swallow whole.     atorvastatin (LIPITOR) 80 MG tablet Take 80 mg by mouth at bedtime.     blood glucose meter kit and supplies KIT Dispense based on patient and insurance preference. Use up to 3 times daily as directed. (FOR ICD-10-E11.9 and Z79.4). (Patient taking differently: Inject 1 each into the skin See admin instructions. Dispense based on patient and insurance preference. Use up to 3 times daily as directed. (FOR ICD-10-E11.9 and Z79.4).) 1 each 0   carvedilol (COREG) 12.5 MG tablet Take 1 tablet (12.5 mg total) by mouth 2 (two) times daily. 180 tablet 3   Cholecalciferol 50 MCG (2000 UT) CAPS 1 tablet     citalopram  (CELEXA) 40 MG tablet Take  1 tablet  Daily  for Mood & Anxiety 90 tablet 1   Continuous Blood Gluc Sensor (FREESTYLE LIBRE 2 SENSOR) MISC Please supply one box and 6 refills. Use as directed (Patient taking differently: 1 each by Other route See admin instructions. Please supply one box and 6 refills. Use as directed) 1 each 6   empagliflozin (JARDIANCE) 25 MG TABS tablet Take 1 tablet by mouth daily.     levothyroxine (SYNTHROID) 137 MCG tablet 1 TAB ON EMPTY STOMACH ONLY WATER FOR 30 MINS.NO ANTACIDS, CALC, MAGN, BIOTIN FOR 4 HRS 30 tablet 2   losartan (COZAAR) 100 MG tablet Take 100 mg by mouth daily. 90 tablet 3   losartan-hydrochlorothiazide (HYZAAR) 100-25 MG tablet Take 1 tablet by mouth every morning.     metFORMIN (GLUCOPHAGE-XR) 500 MG 24 hr tablet Take  2 tablets 2 x  /day with Meals for Diabetes (Patient taking differently: Take 1,000 mg by mouth 2 (two) times daily with a meal.) 360 tablet 0   omeprazole (PRILOSEC) 40 MG capsule TAKE ONE CAPSULE BY MOUTH ONE TIME DAILY TO PREVENT INDIGESTION AND HEARTBURN 90 capsule 0   Semaglutide,0.25 or 0.5MG /DOS, (OZEMPIC, 0.25 OR 0.5 MG/DOSE,) 2 MG/1.5ML SOPN Inject 0.5 mg into the skin once a week.     spironolactone (ALDACTONE) 25 MG tablet Take 1 tablet (25 mg total) by mouth daily. 90 tablet 3   rosuvastatin (CRESTOR) 40 MG tablet Take  1 tablet  Daily  for Cholesterol (Patient not taking: Reported on 06/10/2021) 90 tablet 1   vitamin C (ASCORBIC ACID) 500 MG tablet Take 500 mg by mouth daily. (Patient not taking: Reported on 06/10/2021)     Current Facility-Administered Medications  Medication Dose Route Frequency Provider Last Rate Last Admin   0.9 %  sodium chloride infusion  500 mL Intravenous Once Meryl Dare, MD        Allergies as of 06/24/2021   (No Known Allergies)    Family History  Problem Relation Age of Onset   Breast cancer Mother    Osteoarthritis Father    Heart attack Father    Skin cancer Father    Colon cancer Neg  Hx    Esophageal cancer Neg Hx    Rectal cancer Neg Hx    Stomach cancer Neg Hx    Colon polyps Neg Hx     Social History   Socioeconomic History   Marital status: Married    Spouse name: Not on file   Number of children: 2   Years of education: 13   Highest education level: Some college, no degree  Occupational History   Occupation: Production designer, theatre/television/film    Comment: insurance company  Tobacco Use   Smoking status: Never   Smokeless tobacco: Never  Vaping Use   Vaping Use: Never used  Substance and Sexual Activity   Alcohol use: Yes    Alcohol/week: 0.0 standard drinks    Comment: 20 beers per week   Drug use: Yes    Types: Marijuana   Sexual activity: Not on file  Other Topics Concern   Not on file  Social History Narrative   Not on file   Social Determinants of Health   Financial Resource Strain: Not on file  Food Insecurity: Not on file  Transportation Needs: Not on file  Physical Activity: Not on file  Stress: Not on file  Social Connections: Not on file  Intimate Partner Violence: Not on file    Review of Systems:  All systems reviewed an negative except where noted in HPI.  Gen: Denies any fever, chills, sweats, anorexia, fatigue, weakness, malaise, weight loss, and sleep disorder CV: Denies chest pain, angina, palpitations, syncope, orthopnea, PND, peripheral edema, and claudication. Resp: Denies dyspnea at rest, dyspnea with exercise, cough, sputum, wheezing, coughing up blood, and pleurisy. GI: Denies vomiting blood, jaundice, and fecal incontinence.   Denies dysphagia or odynophagia. GU : Denies urinary burning, blood in urine, urinary frequency, urinary hesitancy, nocturnal urination, and urinary incontinence. MS: Denies joint pain, limitation of movement, and swelling, stiffness, low back pain, extremity pain. Denies muscle weakness, cramps, atrophy.  Derm: Denies rash, itching, dry skin, hives, moles, warts, or unhealing ulcers.  Psych: Denies depression,  anxiety, memory loss, suicidal ideation, hallucinations, paranoia, and confusion. Heme: Denies bruising, bleeding, and enlarged lymph nodes. Neuro:  Denies any  headaches, dizziness, paresthesias. Endo:  Denies any problems with DM, thyroid, adrenal function.   Physical Exam: General:  Alert, well-developed, in NAD Head:  Normocephalic and atraumatic. Eyes:  Sclera clear, no icterus.   Conjunctiva pink. Ears:  Normal auditory acuity. Mouth:  No deformity or lesions.  Neck:  Supple; no masses . Lungs:  Clear throughout to auscultation.   No wheezes, crackles, or rhonchi. No acute distress. Heart:  Regular rate and rhythm; no murmurs. Abdomen:  Soft, nondistended, nontender. No masses, hepatomegaly. No obvious masses.  Normal bowel .    Rectal:  Deferred   Msk:  Symmetrical without gross deformities.. Pulses:  Normal pulses noted. Extremities:  Without edema. Neurologic:  Alert and  oriented x4;  grossly normal neurologically. Skin:  Intact without significant lesions or rashes. Cervical Nodes:  No significant cervical adenopathy. Psych:  Alert and cooperative. Normal mood and affect.   Impression / Plan:   Personal history of adenomatous colon polyps for surveillance colonoscopy.  Venita Lick. Russella Dar  06/24/2021, 7:58 AM See Loretha Stapler, [facility], to contact our on call provider

## 2021-06-28 ENCOUNTER — Telehealth: Payer: Self-pay

## 2021-06-28 NOTE — Telephone Encounter (Signed)
?  Follow up Call- ? ? ?  06/24/2021  ?  7:09 AM  ?Call back number  ?Post procedure Call Back phone  # [phone]  ?Permission to leave phone message Yes  ?  ? ?Patient questions: ? ?Do you have a fever, pain , or abdominal swelling? No. ?Pain Score  0 * ? ?Have you tolerated food without any problems? Yes.   ? ?Have you been able to return to your normal activities? Yes.   ? ?Do you have any questions about your discharge instructions: ?Diet   No. ?Medications  No. ?Follow up visit  No. ? ?Do you have questions or concerns about your Care? No. ? ?Actions: ?* If pain score is 4 or above: ?No action needed, pain <4. ? ?Have you developed a fever since your procedure? no ? ?2.   Have you had an respiratory symptoms (SOB or cough) since your procedure? no ? ?3.   Have you tested positive for COVID 19 since your procedure no ? ?4.   Have you had any family members/close contacts diagnosed with the COVID 19 since your procedure?  no ? ? ?If yes to any of these questions please route to Joylene John, RN and Joella Prince, RN  ? ? ?

## 2021-07-05 ENCOUNTER — Encounter: Payer: Self-pay | Admitting: Gastroenterology

## 2021-07-14 ENCOUNTER — Ambulatory Visit (INDEPENDENT_AMBULATORY_CARE_PROVIDER_SITE_OTHER): Payer: Managed Care, Other (non HMO) | Admitting: Internal Medicine

## 2021-07-14 ENCOUNTER — Encounter: Payer: Self-pay | Admitting: Internal Medicine

## 2021-07-14 VITALS — BP 130/88 | HR 91 | Temp 97.9°F | Resp 17 | Ht 70.0 in | Wt 203.6 lb

## 2021-07-14 DIAGNOSIS — Z1329 Encounter for screening for other suspected endocrine disorder: Secondary | ICD-10-CM | POA: Diagnosis not present

## 2021-07-14 DIAGNOSIS — Z131 Encounter for screening for diabetes mellitus: Secondary | ICD-10-CM

## 2021-07-14 DIAGNOSIS — Z1389 Encounter for screening for other disorder: Secondary | ICD-10-CM

## 2021-07-14 DIAGNOSIS — I700 Atherosclerosis of aorta: Secondary | ICD-10-CM | POA: Diagnosis not present

## 2021-07-14 DIAGNOSIS — Z130 Encounter for screening for diseases of the blood and blood-forming organs and certain disorders involving the immune mechanism: Secondary | ICD-10-CM | POA: Diagnosis not present

## 2021-07-14 DIAGNOSIS — Z111 Encounter for screening for respiratory tuberculosis: Secondary | ICD-10-CM

## 2021-07-14 DIAGNOSIS — Z951 Presence of aortocoronary bypass graft: Secondary | ICD-10-CM | POA: Diagnosis not present

## 2021-07-14 DIAGNOSIS — R350 Frequency of micturition: Secondary | ICD-10-CM | POA: Diagnosis not present

## 2021-07-14 DIAGNOSIS — Z8249 Family history of ischemic heart disease and other diseases of the circulatory system: Secondary | ICD-10-CM

## 2021-07-14 DIAGNOSIS — E1169 Type 2 diabetes mellitus with other specified complication: Secondary | ICD-10-CM

## 2021-07-14 DIAGNOSIS — R5383 Other fatigue: Secondary | ICD-10-CM

## 2021-07-14 DIAGNOSIS — I5042 Chronic combined systolic (congestive) and diastolic (congestive) heart failure: Secondary | ICD-10-CM

## 2021-07-14 DIAGNOSIS — I10 Essential (primary) hypertension: Secondary | ICD-10-CM | POA: Diagnosis not present

## 2021-07-14 DIAGNOSIS — E1122 Type 2 diabetes mellitus with diabetic chronic kidney disease: Secondary | ICD-10-CM

## 2021-07-14 DIAGNOSIS — Z0001 Encounter for general adult medical examination with abnormal findings: Secondary | ICD-10-CM

## 2021-07-14 DIAGNOSIS — E349 Endocrine disorder, unspecified: Secondary | ICD-10-CM

## 2021-07-14 DIAGNOSIS — Z0000 Encounter for general adult medical examination without abnormal findings: Secondary | ICD-10-CM | POA: Diagnosis not present

## 2021-07-14 DIAGNOSIS — Z13220 Encounter for screening for lipoid disorders: Secondary | ICD-10-CM | POA: Diagnosis not present

## 2021-07-14 DIAGNOSIS — G4733 Obstructive sleep apnea (adult) (pediatric): Secondary | ICD-10-CM

## 2021-07-14 DIAGNOSIS — E559 Vitamin D deficiency, unspecified: Secondary | ICD-10-CM

## 2021-07-14 DIAGNOSIS — Z79899 Other long term (current) drug therapy: Secondary | ICD-10-CM

## 2021-07-14 DIAGNOSIS — Z136 Encounter for screening for cardiovascular disorders: Secondary | ICD-10-CM

## 2021-07-14 DIAGNOSIS — Z125 Encounter for screening for malignant neoplasm of prostate: Secondary | ICD-10-CM | POA: Diagnosis not present

## 2021-07-14 DIAGNOSIS — N401 Enlarged prostate with lower urinary tract symptoms: Secondary | ICD-10-CM

## 2021-07-14 DIAGNOSIS — E039 Hypothyroidism, unspecified: Secondary | ICD-10-CM

## 2021-07-14 NOTE — Patient Instructions (Signed)
Due to recent changes in healthcare laws, you may see the results of your imaging and laboratory studies on MyChart before your provider has had a chance to review them.  We understand that in some cases there may be results that are confusing or concerning to you. Not all laboratory results come back in the same time frame and the provider may be waiting for multiple results in order to interpret others.  Please give [place] 48 hours in order for your provider to thoroughly review all the results before contacting the office for clarification of your results.  ? ?++++++++++++++++++++++++++++++++++++++ ? Vit D  & ?Vit C 1,000 mg   ?are recommended to help protect  ?against the Covid-19 and other Corona viruses.  ? ? Also it's recommended  ?to take  ?Zinc 50 mg  ?to help  ?protect against the Covid-19   ?and best place to get ? is also on [employer]  ?and don't pay more than 6-8 cents /pill !  ?=============================== ?Coronavirus (COVID-19) Are you at risk? ? ?Are you at risk for the Coronavirus (COVID-19)? ? ?To be considered HIGH RISK for Coronavirus (COVID-19), you have to meet the following criteria: ? ?Traveled to [place], [place], [place], [place] or [place]; or in the [place] to [place], [place], [place]  ?or [place]; and have fever, cough, and shortness of breath within the last 2 weeks of travel OR ?Been in close contact with a person diagnosed with COVID-19 within the last 2 weeks and have  ?fever, cough,and shortness of breath ? ?IF YOU DO NOT MEET THESE CRITERIA, YOU ARE CONSIDERED LOW RISK FOR COVID-19. ? ?What to do if you are HIGH RISK for COVID-19? ? ?If you are having a medical emergency, call 911. ?Seek medical care right away. Before you go to a doctor?s office, urgent care or emergency department, ? call ahead and tell them about your recent travel, contact with someone diagnosed with COVID-19  ? and your symptoms.  ?You should receive instructions from your physician?s office  regarding next steps of care.  ?When you arrive at healthcare provider, tell the healthcare staff immediately you have returned from  ?visiting [place], [place], [place], [place] or [place]; or traveled in the [place] to [place], [place],  ?[place] or [place] in the last two weeks or you have been in close contact with a person diagnosed with  ?COVID-19 in the last 2 weeks.   ?Tell the health care staff about your symptoms: fever, cough and shortness of breath. ?After you have been seen by a medical provider, you will be either: ?Tested for (COVID-19) and discharged home on quarantine except to seek medical care if  ?symptoms worsen, and asked to  ?Stay home and avoid contact with others until you get your results (4-5 days)  ?Avoid travel on public transportation if possible (such as bus, train, or airplane) or ?Sent to the Emergency Department by EMS for evaluation, COVID-19 testing  and  ?possible admission depending on your condition and test results. ? ?What to do if you are LOW RISK for COVID-19? ? ?Reduce your risk of any infection by using the same precautions used for avoiding the common cold or flu:  ?Wash your hands often with soap and warm water for at least 20 seconds.  If soap and water are not readily available,  ?use an alcohol-based hand sanitizer with at least 60% alcohol.  ?If coughing or sneezing, cover your mouth and nose by coughing  or sneezing into the elbow areas of your shirt or coat, ? into a tissue or into your sleeve (not your hands). ?Avoid shaking hands with others and consider head nods or verbal greetings only. ?Avoid touching your eyes, nose, or mouth with unwashed hands.  ?Avoid close contact with people who are sick. ?Avoid places or events with large numbers of people in one location, like concerts or sporting events. ?Carefully consider travel plans you have or are making. ?If you are planning any travel outside or inside the [place], visit the CDC?s Travelers? Health  webpage for the latest health notices. ?If you have some symptoms but not all symptoms, continue to monitor at home and seek medical attention  ?if your symptoms worsen. ?If you are having a medical emergency, call 911. ?>>>>>>>>>>>>>>>>>>>>>>>>>>>> ?Preventive Care for Adults ? ?A healthy lifestyle and preventive care can promote health and wellness. Preventive health guidelines for men include the following key practices: ?A routine yearly physical is a good way to check with your health care provider about your health and preventative screening. It is a chance to share any concerns and updates on your health and to receive a thorough exam. ?Visit your dentist for a routine exam and preventative care every 6 months. Brush your teeth twice a day and floss once a day. Good oral hygiene prevents tooth decay and gum disease. ?The frequency of eye exams is based on your age, health, family medical history, use of contact lenses, and other factors. Follow your health care provider's recommendations for frequency of eye exams. ?Eat a healthy diet. Foods such as vegetables, fruits, whole grains, low-fat dairy products, and lean protein foods contain the nutrients you need without too many calories. Decrease your intake of foods high in solid fats, added sugars, and salt. Eat the right amount of calories for you. Get information about a proper diet from your health care provider, if necessary. ?Regular physical exercise is one of the most important things you can do for your health. Most adults should get at least 150 minutes of moderate-intensity exercise (any activity that increases your heart rate and causes you to sweat) each week. In addition, most adults need muscle-strengthening exercises on 2 or more days a week. ?Maintain a healthy weight. The body mass index (BMI) is a screening tool to identify possible weight problems. It provides an estimate of body fat based on height and weight. Your health care provider can  find your BMI and can help you achieve or maintain a healthy weight. For adults 20 years and older: ?A BMI below 18.5 is considered underweight. ?A BMI of 18.5 to 24.9 is normal. ?A BMI of 25 to 29.9 is considered overweight. ?A BMI of 30 and above is considered obese. ?Maintain normal blood lipids and cholesterol levels by exercising and minimizing your intake of saturated fat. Eat a balanced diet with plenty of fruit and vegetables. Blood tests for lipids and cholesterol should begin at age 38 and be repeated every 5 years. If your lipid or cholesterol levels are high, you are over 50, or you are at high risk for heart disease, you may need your cholesterol levels checked more frequently. Ongoing high lipid and cholesterol levels should be treated with medicines if diet and exercise are not working. ?If you smoke, find out from your health care provider how to quit. If you do not use tobacco, do not start. ?Lung cancer screening is recommended for adults aged 44-80 years who are at high risk for  developing lung cancer because of a history of smoking. A yearly low-dose CT scan of the lungs is recommended for people who have at least a 30-pack-year history of smoking and are a current smoker or have quit within the past 15 years. A pack year of smoking is smoking an average of 1 pack of cigarettes a day for 1 year (for example: 1 pack a day for 30 years or 2 packs a day for 15 years). Yearly screening should continue until the smoker has stopped smoking for at least 15 years. Yearly screening should be stopped for people who develop a health problem that would prevent them from having lung cancer treatment. ?If you choose to drink alcohol, do not have more than 2 drinks per day. One drink is considered to be 12 ounces (355 mL) of beer, 5 ounces (148 mL) of wine, or 1.5 ounces (44 mL) of liquor. ?Avoid use of street drugs. Do not share needles with anyone. Ask for help if you need support or instructions about  stopping the use of drugs. ?High blood pressure causes heart disease and increases the risk of stroke. Your blood pressure should be checked at least every 1-2 years. Ongoing high blood pressure should be treated

## 2021-07-14 NOTE — Progress Notes (Signed)
? ? ? ?Annual  Screening/Preventative Visit  ?& Comprehensive Evaluation & Examination ? ?Future Appointments  ?Date Time Provider Department  ?07/14/2021             CPE  2:00 PM Unk Pinto, MD [facility]  ?07/19/2021  1:20 PM Skeet Latch, MD [facility]  ?07/19/2022            CPE  2:00 PM Unk Pinto, MD [facility]  ? ?    ?     This very nice 56 y.o.  MWM presents for a Screening /Preventative Visit & comprehensive evaluation and management of multiple medical co-morbidities.  Patient has been followed for HTN, HLD, T2_NIDDM  Prediabetes and Vitamin D Deficiency. ? ? ?    HTN predates circa 1990. Patient's BP has been controlled at home.   Patient has hx/o chronic Biventricular Heart Failure & in May 2021, he underwent CABG x 7 V.  He has hx/o chronic combined CHF. Today's BP is at goal   -  130/88. Patient denies any cardiac symptoms as chest pain, palpitations, shortness of breath, dizziness or ankle swelling. ? ? ?    Patient's hyperlipidemia is controlled with diet and Rosuvastatin. Patient denies myalgias or other medication SE's. Last lipids were at goal : ? ?Lab Results  ?Component Value Date  ? CHOL 145 02/08/2021  ? HDL 63 02/08/2021  ? [place] 65 02/08/2021  ? TRIG 89 02/08/2021  ? CHOLHDL 2.3 02/08/2021  ? ? ? ?    Patient has hx/o T2_NIDDM (2000)  age 22 yo and w/CKD1 (GFR 98) . He is currently treated with Metformin, Jardiance & Ozempic. He reports his Endocrinologist is a Dr Morrie Sheldon with [facility] in [facility].   He has hx/o poor compliance.  Patient denies reactive hypoglycemic symptoms, visual blurring, diabetic polys or paresthesias. Last A1c was not at goal : ?  ?Lab Results  ?Component Value Date  ? HGBA1C 7.1 (H) 02/08/2021  ?  ? ?                                   In 2002,  patient underwent  thyroidectomy for MNG  and has been  on thyroid Replacement since then.                      ?  ?  ?                                     Patient has hx/o Low Testosterone ("323" /2011).   ? ? ?   Finally, patient has history of Vitamin D Deficiency ("20" /2017 and "21" /2021) and last vitamin D was still low :  ?  ?Lab Results  ?Component Value Date  ? VD25OH 41 02/08/2021  ? ? ? ?Current Outpatient Medications on File Prior to Visit  ?Medication Sig  ? aspirin EC 81 MG tablet Take daily.   ? atorvastatin  80 MG tablet Take at bedtime.  ? carvedilol  12.5 MG tablet Take 1 tablet  2  times daily.  ? Vitamin D  2000 u 1 tablet  ? citalopram 40 MG tablet Take  1 tablet  Daily   ? empagliflozin (JARDIANCE) 25 MG TABS  Take 1 tablet  daily.  ? levothyroxine  137 MCG tablet 1 TAB Daily  ?  losartan 100 MG tablet Take daily.  ? losartan-hctz 100-25 MG tablet Take 1 tablet  every morning.  ? metFORMIN -XR 500 MG  Take 2 tablets 2 x  /day with Meals for Diabetes   ? omeprazole (PRILOSEC) 40 MG capsule TAKE ONE DAILY   ? Take  1 tablet  Daily  for Cholesterol (Patient not taking: Reported on 06/10/2021)  ? Semaglutide,0.25 or 0.'5MG'$ /DOS, (OZEMPIC, 0.25 OR 0.5 MG/DOSE,) 2 MG/1.5ML SOPN Inject 0.5 mg into the skin once a week.  ? spironolactone  25 MG tablet Take 1 tablet daily.  ? ? ?No Known Allergies ? ? ?Past Medical History:  ?Diagnosis Date  ? Aneurysm of ascending aorta 09/24/2020  ? 4.0 cm on echo in 2021.  Will repeat on follow-up.  ? Anxiety   ? ASTHMA 10/28/2006  ? Chronic combined systolic and diastolic heart failure ([place]) 09/24/2020  ? Coronary artery disease   ? Cough   ? Diabetes mellitus type II 2000  ? age 56  ? GERD (gastroesophageal reflux disease)   ? Hyperlipidemia   ? Hypertension 1995  ? Hypothyroidism 2002  ? thyroidectomy for Goiter  ? Lumbar back pain   ? NASH (nonalcoholic steatohepatitis)   ? Numbness   ? OSA (obstructive sleep apnea) 11/19/2010  ? Sleep apnea   ? Pt states mild case, doesn't use cpap  ? ? ? ?Health Maintenance  ?Topic Date Due  ? COVID-19 Vaccine (1) Never done  ? OPHTHALMOLOGY EXAM  Never done  ? Zoster Vaccines- Shingrix (1 of 2) Never done  ? FOOT EXAM  07/12/2021  ?  HEMOGLOBIN A1C  08/08/2021  ? INFLUENZA VACCINE  11/02/2021  ? TETANUS/TDAP  08/09/2025  ? Hepatitis C Screening  Completed  ? HIV Screening  Completed  ? HPV VACCINES  Aged Out  ? ? ? ?Immunization History  ?Administered Date(s) Administered  ? Influenza Inj Mdck Quad With Preservative 02/20/2017  ? Influenza Split 01/16/2014  ? PPD Test 10/11/2017, 05/31/2019, 07/13/2020  ? Pneumococcal Polysaccharide-23 08/16/2012  ? Tdap 08/10/2015  ? ? ?Last Colon - 06/24/2021 Colonoscopy - Dr Fuller Plan  ?                                            - Recc 3 yr f/u for multiple polyps (#8) - due Apr 2027 ? ?Past Surgical History:  ?Procedure Laterality Date  ? bilateral shoulder surgery    ? CARDIAC CATHETERIZATION    ? CORONARY ARTERY BYPASS GRAFT N/A 08/27/2019  ? Procedure: CORONARY ARTERY BYPASS GRAFTING (CABG) x7, LIMA TO LAD, SVG TO DIAG 1 WITH SEQUENTIAL SVG TO DIAG 2, SVG TO OM2, SVG TO PDA WITH SEQUENTIAL SVG TO PLB WITH SEQUENTIAL SVG TO OTHER.;  Surgeon: Gaye Pollack, MD;  Location: [facility];  Service: Open Heart Surgery;  Laterality: N/A;  ? ENDOVEIN HARVEST OF GREATER SAPHENOUS VEIN Right 08/27/2019  ? Procedure: [place] Of Greater Saphenous Vein;  Surgeon: Gaye Pollack, MD;  Location: [facility] OR;  Service: Open Heart Surgery;  Laterality: Right;  ? LEFT HEART CATH AND CORONARY ANGIOGRAPHY N/A 08/26/2019  ? Procedure: LEFT HEART CATH AND CORONARY ANGIOGRAPHY;  Surgeon: Nelva Bush, MD;  Location: [place] [facility];  Service: Cardiovascular;  Laterality: N/A;  ? right knee repair  1997  ? TEE WITHOUT CARDIOVERSION N/A 08/27/2019  ? Procedure: TRANSESOPHAGEAL ECHOCARDIOGRAM (TEE);  Surgeon:  Gaye Pollack, MD;  Location: [facility] OR;  Service: Open Heart Surgery;  Laterality: N/A;  ? THYROIDECTOMY    ? right  ? TRIGGER FINGER RELEASE    ? TRIGGER FINGER RELEASE  02/23/2011  ? Procedure: RELEASE TRIGGER FINGER/A-1 PULLEY;  Surgeon: Meredith Pel;  Location: [place];  Service: Orthopedics;  Laterality: Right;  Right  4th trigger finger release  ? ? ? ?Family History  ?Problem Relation Age of Onset  ? Breast cancer Mother   ? Osteoarthritis Father   ? Heart attack Father   ? Skin cancer Father   ? Colon cancer Neg Hx   ? Esophageal cancer Neg Hx   ? Rectal cancer Neg Hx   ? Stomach cancer Neg Hx   ? Colon polyps Neg Hx   ? ? ? ?Social History  ? ?Marital status: Married   ?    Spouse name: Eustaquio Maize  ? Number of children: 2  ? Years of education: 34  ? Highest education level: Some college, no degree  ?Occupational History  ? Occupation: Barrister's clerk  ? ?Tobacco Use  ? Smoking status: Never  ? Smokeless tobacco: Never  ?Vaping Use  ? Vaping Use: Never used  ?Substance Use Topics  ? Alcohol use: Yes  ?  Alcohol/week: 0.0 standard drinks  ?  Comment: 20 beers per week  ? Drug use: Yes  ?  Types: Marijuana  ? ? ? ? ROS ?Constitutional: Denies fever, chills, weight loss/gain, headaches, insomnia,  night sweats or change in appetite. Does c/o fatigue. ?Eyes: Denies redness, blurred vision, diplopia, discharge, itchy or watery eyes.  ?ENT: Denies discharge, congestion, post nasal drip, epistaxis, sore throat, earache, hearing loss, dental pain, Tinnitus, Vertigo, Sinus pain or snoring.  ?Cardio: Denies chest pain, palpitations, irregular heartbeat, syncope, dyspnea, diaphoresis, orthopnea, PND, claudication or edema ?Respiratory: denies cough, dyspnea, DOE, pleurisy, hoarseness, laryngitis or wheezing.  ?Gastrointestinal: Denies dysphagia, heartburn, reflux, water brash, pain, cramps, nausea, vomiting, bloating, diarrhea, constipation, hematemesis, melena, hematochezia, jaundice or hemorrhoids ?Genitourinary: Denies dysuria, frequency, urgency, nocturia, hesitancy, discharge, hematuria or flank pain ?Musculoskeletal: Denies arthralgia, myalgia, stiffness, Jt. Swelling, pain, limp or strain/sprain. Denies Falls. ?Skin: Denies puritis, rash, hives, warts, acne, eczema or change in skin lesion ?Neuro: No weakness, tremor,  incoordination, spasms, paresthesia or pain ?Psychiatric: Denies confusion, memory loss or sensory loss. Denies Depression. ?Endocrine: Denies change in weight, skin, hair change, nocturia, and paresthesia, diabeti

## 2021-07-15 ENCOUNTER — Other Ambulatory Visit: Payer: Self-pay | Admitting: Internal Medicine

## 2021-07-15 LAB — URINALYSIS, ROUTINE W REFLEX MICROSCOPIC
Bilirubin Urine: NEGATIVE
Hgb urine dipstick: NEGATIVE
Ketones, ur: NEGATIVE
Leukocytes,Ua: NEGATIVE
Nitrite: NEGATIVE
Protein, ur: NEGATIVE
Specific Gravity, Urine: 1.018 (ref 1.001–1.035)
pH: 5.5 (ref 5.0–8.0)

## 2021-07-15 LAB — CBC WITH DIFFERENTIAL/PLATELET
Absolute Monocytes: 582 cells/uL (ref 200–950)
Basophils Absolute: 57 cells/uL (ref 0–200)
Basophils Relative: 0.8 %
Eosinophils Absolute: 199 cells/uL (ref 15–500)
Eosinophils Relative: 2.8 %
HCT: 46.4 % (ref 38.5–50.0)
Hemoglobin: 16.2 g/dL (ref 13.2–17.1)
Lymphs Abs: 2492 cells/uL (ref 850–3900)
MCH: 33.3 pg — ABNORMAL HIGH (ref 27.0–33.0)
MCHC: 34.9 g/dL (ref 32.0–36.0)
MCV: 95.3 fL (ref 80.0–100.0)
MPV: 10.2 fL (ref 7.5–12.5)
Monocytes Relative: 8.2 %
Neutro Abs: 3770 cells/uL (ref 1500–7800)
Neutrophils Relative %: 53.1 %
Platelets: 203 10*3/uL (ref 140–400)
RBC: 4.87 10*6/uL (ref 4.20–5.80)
RDW: 12.8 % (ref 11.0–15.0)
Total Lymphocyte: 35.1 %
WBC: 7.1 10*3/uL (ref 3.8–10.8)

## 2021-07-15 LAB — COMPLETE METABOLIC PANEL WITH GFR
AG Ratio: 1.4 (calc) (ref 1.0–2.5)
ALT: 20 U/L (ref 9–46)
AST: 19 U/L (ref 10–35)
Albumin: 4.8 g/dL (ref 3.6–5.1)
Alkaline phosphatase (APISO): 69 U/L (ref 35–144)
BUN: 23 mg/dL (ref 7–25)
CO2: 26 mmol/L (ref 20–32)
Calcium: 10.1 mg/dL (ref 8.6–10.3)
Chloride: 97 mmol/L — ABNORMAL LOW (ref 98–110)
Creat: 1.01 mg/dL (ref 0.70–1.30)
Globulin: 3.5 g/dL (calc) (ref 1.9–3.7)
Glucose, Bld: 117 mg/dL — ABNORMAL HIGH (ref 65–99)
Potassium: 4.6 mmol/L (ref 3.5–5.3)
Sodium: 135 mmol/L (ref 135–146)
Total Bilirubin: 1 mg/dL (ref 0.2–1.2)
Total Protein: 8.3 g/dL — ABNORMAL HIGH (ref 6.1–8.1)
eGFR: 86 mL/min/{1.73_m2} (ref >=60)

## 2021-07-15 LAB — INSULIN, RANDOM: Insulin: 14.4 u[IU]/mL

## 2021-07-15 LAB — TESTOSTERONE: Testosterone: 707 ng/dL (ref 250–827)

## 2021-07-15 LAB — LIPID PANEL
Cholesterol: 158 mg/dL (ref <200)
HDL: 61 mg/dL (ref >=40)
LDL Cholesterol (Calc): 73 mg/dL (calc)
Non-HDL Cholesterol (Calc): 97 mg/dL (calc) (ref <130)
Total CHOL/HDL Ratio: 2.6 (calc) (ref <5.0)
Triglycerides: 166 mg/dL — ABNORMAL HIGH (ref <150)

## 2021-07-15 LAB — IRON, TOTAL/TOTAL IRON BINDING CAP
%SAT: 30 % (calc) (ref 20–48)
Iron: 107 ug/dL (ref 50–180)
TIBC: 359 mcg/dL (calc) (ref 250–425)

## 2021-07-15 LAB — MICROALBUMIN / CREATININE URINE RATIO
Creatinine, Urine: 59 mg/dL (ref 20–320)
Microalb Creat Ratio: 22 mcg/mg creat (ref <30)
Microalb, Ur: 1.3 mg/dL

## 2021-07-15 LAB — VITAMIN D 25 HYDROXY (VIT D DEFICIENCY, FRACTURES): Vit D, 25-Hydroxy: 41 ng/mL (ref 30–100)

## 2021-07-15 LAB — PSA: PSA: 0.49 ng/mL (ref <=4.00)

## 2021-07-15 LAB — HEMOGLOBIN A1C
Hgb A1c MFr Bld: 6.6 % of total Hgb — ABNORMAL HIGH (ref <5.7)
Mean Plasma Glucose: 143 mg/dL
eAG (mmol/L): 7.9 mmol/L

## 2021-07-15 LAB — TSH: TSH: 7.31 mIU/L — ABNORMAL HIGH (ref 0.40–4.50)

## 2021-07-15 LAB — MAGNESIUM: Magnesium: 2 mg/dL (ref 1.5–2.5)

## 2021-07-15 LAB — VITAMIN B12: Vitamin B-12: 309 pg/mL (ref 200–1100)

## 2021-07-15 MED ORDER — LEVOTHYROXINE SODIUM 150 MCG PO TABS: ORAL_TABLET | ORAL | 1 refills | Status: DC

## 2021-07-15 NOTE — Progress Notes (Signed)
<><><><><><><><><><><><><><><><><><><><><><><><><><><><><><><><><> ?<><><><><><><><><><><><><><><><><><><><><><><><><><><><><><><><><> ?- Test results slightly outside the reference range are not unusual. ?If there is anything important, I will review this with you,  ?otherwise it is considered normal test values.  ?If you have further questions,  ?please do not hesitate to contact me at the office or via My Chart.  ?<><><><><><><><><><><><><><><><><><><><><><><><><><><><><><><><><> ? ?-  Total Chol = 158   &   LDL Chol = 73      - Both   Excellent  ? ?<><><><><><><><><><><><><><><><><><><><><><><><><><><><><><><><><> ? ?-  TSH is elevated which means thyroid hormone is low in blood  ?                        - So sent in a new Rx increasing dose from 137 up to 150 mcg  /daily ? ?- Also , it's VERY IMPORTANT that you're not taking large doses of Biotin which  ?            will interfere with the Measurement of TSH and give incorrect measurements !  ? ?<><><><><><><><><><><><><><><><><><><><><><><><><><><><><><><><><> ? ?-  A1c is a little better - down from 7.1% to now 6.6 % ?                                                      - Still a way to go as the Goal is less than 5.7%  ! ?<><><><><><><><><><><><><><><><><><><><><><><><><><><><><><><><><> ? ?-   ?-  Vitamin B12 =   309    -     Very Low  ! ?(Ideal or Goal Vit B12 is between 450 - 1,100)  ? ?Low Vit B12 may be associated with Anemia , Fatigue, Impotence,  ? ?Peripheral Neuropathy, Dementia, "Brain Fog" & Depression ? ?- So . . . . . ? ?- Recommend take a sub-lingual form of Vitamin B12 tablet  ? ?1,000 to 5,000 mcg tab that you dissolve under your tongue /Daily  ? ?- Can get [place] - best price at [employer] or on [place] ? ?<><><><><><><><><><><><><><><><><><><><><><><><><><><><><><><><><> ? ?-  PSA is Low - Great  ! ? ?<><><><><><><><><><><><><><><><><><><><><><><><><><><><><><><><><> ? ?-  Testosterone   level is Normal   ? ?<><><><><><><><><><><><><><><><><><><><><><><><><><><><><><><><><> ? ?-  Vitamin D = 41  is very Low  !  ? ?- Vitamin D goal is between 70-100.  ? ?- Please INCREASE your Vitamin D to 10,000 units  /daily ?               ( Vitamin D capsule x 2 capsules = 10,000 units /day )  ? ?- It is very important as a natural anti-inflammatory and helping the  ?immune system protect against viral infections, like the Covid-19  ? ? ?- helping hair, skin, and nails, as well as reducing stroke and heart attack risk.  ? ?- It helps your bones and helps with mood. ? ?- It also decreases numerous cancer risks so please take it as directed.  ? ?- Low Vit D is associated with a 200-300% higher risk for CANCER  ? ?and 200-300% higher risk for HEART   ATTACK  &  STROKE.   ? ?- It is also associated with higher death rate at younger ages,  ? ?autoimmune diseases like Rheumatoid arthritis, Lupus,  ?Multiple Sclerosis.    ? ?- Also many other serious conditions,  ? ?  like Depression,  ?                          Alzheimer's Dementia,  ?                                                 muscle aches,  ?                                                                   Fatigue &  ?                                                                          fibromyalgia  ? ?- just to name a few. ?<><><><><><><><><><><><><><><><><><><><><><><><><><><><><><><><><> ? ?-  All Else - CBC - Kidneys - Electrolytes - Liver - Magnesium & Thyroid   ? ?- all  Normal / OK ?<><><><><><><><><><><><><><><><><><><><><><><><><><><><><><><><><> ?<><><><><><><><><><><><><><><><><><><><><><><><><><><><><><><><><> ? ? ? ? ? ? ? ? ? ?<><><><><><><><><><><><><><><><><><><><><><><><><><><><><><><><><> ? ?-   ? ? ? ? ? ? ? ? ? ? ? ? ? ? ? ? ? ? ? ? ? ? ? ? ? ? ? ? ? ? ? ? ?

## 2021-07-19 ENCOUNTER — Encounter (HOSPITAL_BASED_OUTPATIENT_CLINIC_OR_DEPARTMENT_OTHER): Payer: Self-pay | Admitting: Cardiovascular Disease

## 2021-07-19 ENCOUNTER — Ambulatory Visit (INDEPENDENT_AMBULATORY_CARE_PROVIDER_SITE_OTHER): Payer: Managed Care, Other (non HMO) | Admitting: Cardiovascular Disease

## 2021-07-19 DIAGNOSIS — I7121 Aneurysm of the ascending aorta, without rupture: Secondary | ICD-10-CM

## 2021-07-19 DIAGNOSIS — I10 Essential (primary) hypertension: Secondary | ICD-10-CM

## 2021-07-19 DIAGNOSIS — E782 Mixed hyperlipidemia: Secondary | ICD-10-CM | POA: Diagnosis not present

## 2021-07-19 DIAGNOSIS — Z951 Presence of aortocoronary bypass graft: Secondary | ICD-10-CM

## 2021-07-19 MED ORDER — BLOOD PRESSURE KIT: PACK | 0 refills | Status: AC

## 2021-07-19 NOTE — Assessment & Plan Note (Signed)
Stable on echo 04/2021.  Repeat in one year.  BP controlled.  Continue carvedilol. ?

## 2021-07-19 NOTE — Patient Instructions (Addendum)
Medication Instructions:  ?Your Physician recommend you continue on your current medication as directed.   ? ?*If you need a refill on your cardiac medications before your next appointment, please call your pharmacy* ? ? ?Lab Work: ?None  ? ? ?Testing/Procedures: ?Your physician has requested that you have an echocardiogram in 1 year. Echocardiography is a painless test that uses sound waves to create images of your heart. It provides your doctor with information about the size and shape of your heart and how well your heart?s chambers and valves are working. This procedure takes approximately one hour. There are no restrictions for this procedure. ?[place] ? ? ? ?Follow-Up: ?At [facility], you and your health needs are our priority.  As part of our continuing mission to provide you with exceptional heart care, we have created designated Provider Care Teams.  These Care Teams include your primary Cardiologist (physician) and Advanced Practice Providers (APPs -  Physician Assistants and Nurse Practitioners) who all work together to provide you with the care you need, when you need it. ? ?We recommend signing up for the patient portal called "MyChart".  Sign up information is provided on this After Visit Summary.  MyChart is used to connect with patients for Virtual Visits (Telemedicine).  Patients are able to view lab/test results, encounter notes, upcoming appointments, etc.  Non-urgent messages can be sent to your provider as well.   ?To learn more about what you can do with MyChart, go to [email or web address].   ? ?Your next appointment:   ?1 year(s) ? ?The format for your next appointment:   ?In Person ? ?Provider:   ?Skeet Latch, MD{ ? ? ? ?Important Information About Sugar ? ? ? ? ? ? ?

## 2021-07-19 NOTE — Assessment & Plan Note (Signed)
Blood pressure is above goal today.  He notes that he has not taken his medication yet today or yesterday.  He is going to work on being more consistent with his medications.  Recommended that he put his morning dose with his toothbrush.  Continue carvedilol, losartan/HCTZ, and spironolactone. ?

## 2021-07-19 NOTE — Progress Notes (Signed)
? ?Cardiology Office Note  ? ?Date:  07/19/2021  ? ?ID:  [name] [name], DOB [birth year], MRN [id number] ? ?PCP:  Unk Pinto, MD  ?Cardiologist:  Skeet Latch, MD  ?Electrophysiologist:  None  ? ?Evaluation Performed:  Follow-Up Visit ? ?Chief Complaint:  hypertension ? ?History of Present Illness:   ? ?[name] [name] is a 56 y.o. male with chronic systolic diastolic heart failure, CAD status post CABG 08/2019, mild ascending aorta aneurysm, diabetes, CKD, hypothyroidism, GERD, anxiety, OSA, and obesity who presents for follow-up.  He was initially seen in the hospital 08/2019 with chest pain in the setting of NSTEMI.  He presented to [place] and was transferred to [facility].  High-sensitivity troponin was over 13,000.  He underwent left heart catheterization 08/26/2019 that revealed diffuse three-vessel disease, including 90% left main disease.  He subsequently underwent CABG (LIMA--> LAD, SVG-->D1/D2, SVG-->PDA, PL1, PL2) with Dr. Cyndia Bent.  Echo that admission revealed LVEF 40 to 45% with grade 2 diastolic dysfunction.  LVEF improved to 60-65% on echo 9/021.  He was seen in the hospital 09/2019 with chest pain and dizziness.  He was hypotensive to the 56C systolic.  He elected not to participate in cardiac rehab.  He is back working at the [employer].  ? ?At his appointment 03/2020 Losartan was added. He was encouraged to increase his exercise. He followed up with our pharmacist, and was started on Carvedilol 3.125 mg given that he had not tolerated beta-blockers in the past. He followed up with our pharmacist 12/2020 and his blood pressure was 160/98 but improved to 138/86 on repeat. He noticed lip tingling, so losartan was reduced and carvedilol was increased. Losartan was increased to 100 mg with his PCP 02/2021. He was concerned about right LE numbness he thought was related to a previous vein harvest 08/2019. On his follow-up with our pharmacist 02/19/2021, he reported  having COVID 3 weeks prior.  His blood pressure was 132/88 at that time. ? ?At his last appointment he was feeling well and exercising routinely. He noted frequently missed nighttime doses of his medication. Spironolactone was increased to 25 mg. He followed up with the pharmacist 04/2021 and carvedilol was increased. He had a repeat Echo 04/2021 with LVEF 55-60%. Ascending aorta was stable at 4.0 cm. Today, he is feeling good overall with no new complaints. At home he has not been checking his BP as he needs to replace his machine. He admits to forgetting to take his antihypertensives yesterday and this morning. Also, he has ongoing RLE numbness that he attributes to a prior procedure. For exercise he mainly goes on walks. Generally he feels well during strenuous activity. His LDL was 73 at his recent physical. On Ozempic he sometimes feels nauseous, but otherwise is doing well with hunger control and weight loss. He is down to 202 lbs. He confirms that he is currently taking losartan/HCTZ. He denies any palpitations, chest pain, shortness of breath, or peripheral edema. No lightheadedness, headaches, syncope, orthopnea, or PND. ? ? ?Past Medical History:  ?Diagnosis Date  ? Aneurysm of ascending aorta ([place]) 09/24/2020  ? 4.0 cm on echo in 2021.  Will repeat on follow-up.  ? Anxiety   ? ASTHMA 10/28/2006  ? Chronic combined systolic and diastolic heart failure ([place]) 09/24/2020  ? Coronary artery disease   ? Cough   ? Diabetes mellitus type II 2000  ? age 60  ? GERD (gastroesophageal reflux disease)   ? Hyperlipidemia   ?  Hypertension 1995  ? Hypothyroidism 2002  ? thyroidectomy for Goiter  ? Lumbar back pain   ? NASH (nonalcoholic steatohepatitis)   ? Numbness   ? OSA (obstructive sleep apnea) 11/19/2010  ? Sleep apnea   ? Pt states mild case, doesn't use cpap  ? ? ?Past Surgical History:  ?Procedure Laterality Date  ? bilateral shoulder surgery    ? CARDIAC CATHETERIZATION    ? CORONARY ARTERY BYPASS GRAFT N/A 08/27/2019   ? Procedure: CORONARY ARTERY BYPASS GRAFTING (CABG) x7, LIMA TO LAD, SVG TO DIAG 1 WITH SEQUENTIAL SVG TO DIAG 2, SVG TO OM2, SVG TO PDA WITH SEQUENTIAL SVG TO PLB WITH SEQUENTIAL SVG TO OTHER.;  Surgeon: Gaye Pollack, MD;  Location: [facility] OR;  Service: Open Heart Surgery;  Laterality: N/A;  ? ENDOVEIN HARVEST OF GREATER SAPHENOUS VEIN Right 08/27/2019  ? Procedure: [place] Of Greater Saphenous Vein;  Surgeon: Gaye Pollack, MD;  Location: [facility] OR;  Service: Open Heart Surgery;  Laterality: Right;  ? LEFT HEART CATH AND CORONARY ANGIOGRAPHY N/A 08/26/2019  ? Procedure: LEFT HEART CATH AND CORONARY ANGIOGRAPHY;  Surgeon: Nelva Bush, MD;  Location: [place] [facility];  Service: Cardiovascular;  Laterality: N/A;  ? right knee repair  1997  ? TEE WITHOUT CARDIOVERSION N/A 08/27/2019  ? Procedure: TRANSESOPHAGEAL ECHOCARDIOGRAM (TEE);  Surgeon: Gaye Pollack, MD;  Location: [place];  Service: Open Heart Surgery;  Laterality: N/A;  ? THYROIDECTOMY    ? right  ? TRIGGER FINGER RELEASE    ? TRIGGER FINGER RELEASE  02/23/2011  ? Procedure: RELEASE TRIGGER FINGER/A-1 PULLEY;  Surgeon: Meredith Pel;  Location: [place];  Service: Orthopedics;  Laterality: Right;  Right 4th trigger finger release  ? ? ? ?Current Outpatient Medications  ?Medication Sig Dispense Refill  ? aspirin EC 81 MG tablet Take 81 mg by mouth daily. Swallow whole.    ? atorvastatin (LIPITOR) 80 MG tablet Take 80 mg by mouth at bedtime.    ? blood glucose meter kit and supplies KIT Dispense based on patient and insurance preference. Use up to 3 times daily as directed. (FOR ICD-10-E11.9 and Z79.4). (Patient taking differently: Inject 1 each into the skin See admin instructions. Dispense based on patient and insurance preference. Use up to 3 times daily as directed. (FOR ICD-10-E11.9 and Z79.4).) 1 each 0  ? carvedilol (COREG) 12.5 MG tablet Take 1 tablet (12.5 mg total) by mouth 2 (two) times daily. 180 tablet 3  ? Cholecalciferol 50 MCG  (2000 UT) CAPS 1 tablet    ? citalopram (CELEXA) 40 MG tablet Take  1 tablet  Daily  for Mood & Anxiety 90 tablet 1  ? Continuous Blood Gluc Sensor (FREESTYLE LIBRE 2 SENSOR) MISC Please supply one box and 6 refills. Use as directed (Patient taking differently: 1 each by Other route See admin instructions. Please supply one box and 6 refills. Use as directed) 1 each 6  ? empagliflozin (JARDIANCE) 25 MG TABS tablet Take 1 tablet by mouth daily.    ? levothyroxine (SYNTHROID) 150 MCG tablet Take  1 tablet  Daily  on an empty stomach with only water for 30 minutes & no Antacid meds, Calcium or Magnesium for 4 hours & avoid Biotin 90 tablet 1  ? losartan-hydrochlorothiazide (HYZAAR) 100-25 MG tablet Take 1 tablet by mouth every morning.    ? metFORMIN (GLUCOPHAGE-XR) 500 MG 24 hr tablet Take 2 tablets 2 x  /day with Meals for Diabetes (Patient taking differently:  Take 1,000 mg by mouth 2 (two) times daily with a meal.) 360 tablet 0  ? omeprazole (PRILOSEC) 40 MG capsule TAKE ONE CAPSULE BY MOUTH ONE TIME DAILY TO PREVENT INDIGESTION AND HEARTBURN 90 capsule 0  ? OZEMPIC, 1 MG/DOSE, 4 MG/3ML SOPN Inject 1 mg into the skin once a week.    ? rosuvastatin (CRESTOR) 40 MG tablet Take  1 tablet  Daily  for Cholesterol 90 tablet 1  ? spironolactone (ALDACTONE) 25 MG tablet Take 1 tablet (25 mg total) by mouth daily. 90 tablet 3  ? vitamin C (ASCORBIC ACID) 500 MG tablet Take 500 mg by mouth daily.    ? ?No current facility-administered medications for this visit.  ? ? ?Allergies:   Patient has no known allergies.  ? ? ?Social History:  The patient  reports that he has never smoked. He has never used smokeless tobacco. He reports current alcohol use. He reports current drug use. Drug: Marijuana.  ? ?Family History:  The patient's family history includes Breast cancer in his mother; Heart attack in his father; Osteoarthritis in his father; Skin cancer in his father.  ? ? ?ROS:   ?Please see the history of present illness. ?(+)  RLE numbness ?All other systems are reviewed and negative.  ? ? ?PHYSICAL EXAM: ?VS:  BP 138/88 (BP Location: Right Arm, Patient Position: Sitting, Cuff Size: Normal)   Pulse 80   Ht $R'5\' 10"'bN$  (1.778 m)   Wt

## 2021-07-19 NOTE — Assessment & Plan Note (Signed)
He has done well and has no angina.  Continue aspirin, atorvastatin, and carvedilol.  He was encouraged to increase his exercise to at least 150 minutes weekly.  He has done well on Ozempic for weight loss. ?

## 2021-07-19 NOTE — Assessment & Plan Note (Signed)
Lipids are reasonably well controlled.  LDL is 73.  He is going to work on increasing his exercise.  Continue atorvastatin. ?

## 2021-07-28 ENCOUNTER — Encounter: Payer: Self-pay | Admitting: Internal Medicine

## 2021-08-29 ENCOUNTER — Other Ambulatory Visit: Payer: Self-pay | Admitting: Nurse Practitioner

## 2021-10-14 ENCOUNTER — Other Ambulatory Visit: Payer: Self-pay | Admitting: Internal Medicine

## 2021-10-21 ENCOUNTER — Ambulatory Visit: Payer: Managed Care, Other (non HMO) | Admitting: Nurse Practitioner

## 2021-10-27 ENCOUNTER — Ambulatory Visit: Payer: Managed Care, Other (non HMO) | Admitting: Nurse Practitioner

## 2021-10-27 VITALS — BP 120/70 | HR 78 | Temp 97.5°F | Ht 70.0 in | Wt 202.0 lb

## 2021-10-27 DIAGNOSIS — E119 Type 2 diabetes mellitus without complications: Secondary | ICD-10-CM | POA: Diagnosis not present

## 2021-10-27 DIAGNOSIS — I10 Essential (primary) hypertension: Secondary | ICD-10-CM

## 2021-10-27 DIAGNOSIS — E039 Hypothyroidism, unspecified: Secondary | ICD-10-CM | POA: Diagnosis not present

## 2021-10-27 DIAGNOSIS — Z79899 Other long term (current) drug therapy: Secondary | ICD-10-CM | POA: Diagnosis not present

## 2021-10-27 DIAGNOSIS — Z794 Long term (current) use of insulin: Secondary | ICD-10-CM

## 2021-10-27 NOTE — Progress Notes (Signed)
FOLLOW UP  Assessment and Plan:   1. Essential hypertension Controlled  Continue medications;  Discussed DASH (Dietary Approaches to Stop Hypertension) DASH diet is lower in sodium than a typical American diet. Cut back on foods that are high in saturated fat, cholesterol, and trans fats. Eat more whole-grain foods, fish, poultry, and nuts Remain active and exercise as tolerated daily.  Monitor BP at home-Call if greater than 130/80.    2. Type 2 diabetes mellitus without complication, with long-term current use of insulin ([place]) Just had A1C checked with Endo last week 09/20/2021:  Results 6.4% Down from 6.6% in April. Continue Ozempic. Education: Reviewed 'ABCs' of diabetes management  A1C (<7) Blood pressure (<130/80) Cholesterol (LDL <70) Continue Eye Exam yearly  Continue Dental Exam Q6 mo Discussed dietary recommendations Discussed Physical Activity recommendations  3. Acquired hypothyroidism Elevated TSH >7 during 07/2021 check-up Saw Endocrinology last week 10/20/21 and levels resulted to WNL. Continue to monitor   4. Medication management All medications discussed and reviewed in full. All questions and concerns regarding medications addressed.      Continue diet and meds as discussed. Further disposition pending results of labs. Discussed med's effects and SE's.    Over 20 minutes of exam, counseling, chart review, and critical decision making was performed.   Future Appointments  Date Time Provider [place]  01/25/2022  2:30 PM Unk Pinto, MD [facility] None  07/19/2022  2:00 PM Unk Pinto, MD [facility] None  07/20/2022  2:00 PM [facility]    ----------------------------------------------------------------------------------------------------------------------  HPI 56 y.o. male  presents for 3 month follow up on hypertension, cholesterol, diabetes, weight and vitamin D deficiency.   He reports today that during last  check up on 07/2021 his A1C and TSH levels were elevated.  He followed up with Endocrinology last week 10/20/21 with reports of A1C decreasing and TSH levels now normalized.  He has no additional concerns in clinic today.  BMI is Body mass index is 28.98 kg/m., he has been working on diet and exercise.  Continues on Ozempic. Wt Readings from Last 3 Encounters:  10/27/21 202 lb (91.6 kg)  07/19/21 202 lb 3.2 oz (91.7 kg)  07/14/21 203 lb 9.6 oz (92.4 kg)    His blood pressure has been controlled at home, today their BP is BP: 120/70  He does workout. He denies chest pain, shortness of breath, dizziness.  He has been working on diet and exercise for prediabetes, and denies polydipsia and polyuria. Last A1C in the office was:  Lab Results  Component Value Date   HGBA1C 6.6 (H) 07/14/2021  He was seen at Endocrinology last week 10/21/19 and A1C levels decreased to 6.4%.  Patient is on Vitamin D supplement.   Lab Results  Component Value Date   VD25OH 41 07/14/2021      TSH levels from [facility] resulted to 3.19.  Current Medications:  Current Outpatient Medications on File Prior to Visit  Medication Sig   aspirin EC 81 MG tablet Take 81 mg by mouth daily. Swallow whole.   atorvastatin (LIPITOR) 80 MG tablet Take 80 mg by mouth at bedtime.   blood glucose meter kit and supplies KIT Dispense based on patient and insurance preference. Use up to 3 times daily as directed. (FOR ICD-10-E11.9 and Z79.4). (Patient taking differently: Inject 1 each into the skin See admin instructions. Dispense based on patient and insurance preference. Use up to 3 times daily as directed. (FOR ICD-10-E11.9 and Z79.4).)   Blood  Pressure KIT Monitor blood pressure daily   carvedilol (COREG) 12.5 MG tablet Take 1 tablet (12.5 mg total) by mouth 2 (two) times daily.   Cholecalciferol 50 MCG (2000 UT) CAPS 1 tablet   citalopram (CELEXA) 40 MG tablet TAKE ONE TABLET DAILY FOR mood AND anxiety    Continuous Blood Gluc Sensor (FREESTYLE LIBRE 2 SENSOR) MISC Please supply one box and 6 refills. Use as directed (Patient taking differently: 1 each by Other route See admin instructions. Please supply one box and 6 refills. Use as directed)   empagliflozin (JARDIANCE) 25 MG TABS tablet Take 1 tablet by mouth daily.   levothyroxine (SYNTHROID) 150 MCG tablet Take  1 tablet  Daily  on an empty stomach with only water for 30 minutes & no Antacid meds, Calcium or Magnesium for 4 hours & avoid Biotin   losartan-hydrochlorothiazide (HYZAAR) 100-25 MG tablet Take 1 tablet by mouth every morning.   metFORMIN (GLUCOPHAGE-XR) 500 MG 24 hr tablet Take 2 tablets 2 x  /day with Meals for Diabetes (Patient taking differently: Take 1,000 mg by mouth 2 (two) times daily with a meal.)   omeprazole (PRILOSEC) 40 MG capsule Take  1 capsule  Daily  To Prevent Heartburn & Indigestion                                   /                 TAKE                                   BY                  MOUTH                      ONE TIME DAILY   OZEMPIC, 1 MG/DOSE, 4 MG/3ML SOPN Inject 1 mg into the skin once a week.   rosuvastatin (CRESTOR) 40 MG tablet Take  1 tablet  Daily  for Cholesterol   spironolactone (ALDACTONE) 25 MG tablet Take 1 tablet (25 mg total) by mouth daily.   vitamin C (ASCORBIC ACID) 500 MG tablet Take 500 mg by mouth daily.   No current facility-administered medications on file prior to visit.     Allergies: No Known Allergies   Medical History:  Past Medical History:  Diagnosis Date   Aneurysm of ascending aorta ([place]) 09/24/2020   4.0 cm on echo in 2021.  Will repeat on follow-up.   Anxiety    ASTHMA 10/28/2006   Chronic combined systolic and diastolic heart failure ([place]) 09/24/2020   Coronary artery disease    Cough    Diabetes mellitus type II 2000   age 69   GERD (gastroesophageal reflux disease)    Hyperlipidemia    Hypertension 1995   Hypothyroidism 2002   thyroidectomy for Goiter    Lumbar back pain    NASH (nonalcoholic steatohepatitis)    Numbness    OSA (obstructive sleep apnea) 11/19/2010   Sleep apnea    Pt states mild case, doesn't use cpap   Family history- Reviewed and unchanged Social history- Reviewed and unchanged   Review of Systems: All review systems reviewed and negative except for pertinent positives in history of present illness.   Physical Exam: BP 120/70   Pulse 78  Temp (!) 97.5 F (36.4 C)   Ht $R'5\' 10"'wf$  (1.778 m)   Wt 202 lb (91.6 kg)   SpO2 98%   BMI 28.98 kg/m  Wt Readings from Last 3 Encounters:  10/27/21 202 lb (91.6 kg)  07/19/21 202 lb 3.2 oz (91.7 kg)  07/14/21 203 lb 9.6 oz (92.4 kg)   General Appearance: Well nourished, in no apparent distress. Eyes: PERRLA, EOMs, conjunctiva no swelling or erythema Sinuses: No Frontal/maxillary tenderness ENT/Mouth: Ext aud canals clear, TMs without erythema, bulging. No erythema, swelling, or exudate on post pharynx.  Tonsils not swollen or erythematous. Hearing normal.  Neck: Supple, thyroid normal.  Respiratory: Respiratory effort normal, BS equal bilaterally without rales, rhonchi, wheezing or stridor.  Cardio: RRR with no MRGs. Brisk peripheral pulses without edema.  Abdomen: Soft, + BS.  Non tender, no guarding, rebound, hernias, masses. Lymphatics: Non tender without lymphadenopathy.  Musculoskeletal: Full ROM, 5/5 strength, Normal gait Skin: Warm, dry without rashes, lesions, ecchymosis.  Neuro: Cranial nerves intact. No cerebellar symptoms.  Psych: Awake and oriented X 3, normal affect, Insight and Judgment appropriate.    Darrol Jump, NP 2:56 PM [facility]

## 2021-11-22 ENCOUNTER — Ambulatory Visit: Payer: Managed Care, Other (non HMO) | Admitting: Nurse Practitioner

## 2022-01-25 ENCOUNTER — Ambulatory Visit: Payer: Managed Care, Other (non HMO) | Admitting: Internal Medicine

## 2022-01-27 ENCOUNTER — Ambulatory Visit: Payer: Managed Care, Other (non HMO) | Admitting: Internal Medicine

## 2022-04-19 ENCOUNTER — Other Ambulatory Visit (HOSPITAL_BASED_OUTPATIENT_CLINIC_OR_DEPARTMENT_OTHER): Payer: Self-pay | Admitting: Cardiovascular Disease

## 2022-04-19 DIAGNOSIS — I5042 Chronic combined systolic (congestive) and diastolic (congestive) heart failure: Secondary | ICD-10-CM

## 2022-05-22 ENCOUNTER — Other Ambulatory Visit: Payer: Self-pay | Admitting: Cardiovascular Disease

## 2022-05-23 NOTE — Telephone Encounter (Signed)
Rx(s) sent to pharmacy electronically.  

## 2022-06-18 ENCOUNTER — Other Ambulatory Visit: Payer: Self-pay | Admitting: Nurse Practitioner

## 2022-06-18 ENCOUNTER — Other Ambulatory Visit: Payer: Self-pay | Admitting: Internal Medicine

## 2022-06-27 LAB — HM DIABETES EYE EXAM

## 2022-06-28 ENCOUNTER — Encounter: Payer: Self-pay | Admitting: Internal Medicine

## 2022-07-18 ENCOUNTER — Encounter: Payer: Self-pay | Admitting: Internal Medicine

## 2022-07-18 NOTE — Patient Instructions (Addendum)
Due to recent changes in healthcare laws, you may see the results of your imaging and laboratory studies on MyChart before your provider has had a chance to review them.  We understand that in some cases there may be results that are confusing or concerning to you. Not all laboratory results come back in the same time frame and the provider may be waiting for multiple results in order to interpret others.  Please give us 48 hours in order for your provider to thoroughly review all the results before contacting the office for clarification of your results.   ++++++++++++++++++++++++++++++++++++++  Vit D  & Vit C 1,000 mg   are recommended to help protect  against the Covid-19 and other Corona viruses.    Also it's recommended  to take  Zinc 50 mg  to help  protect against the Covid-19   and best place to get  is also on [employer]  and don't pay more than 6-8 cents /pill !  =============================== Coronavirus (COVID-19) Are you at risk?  Are you at risk for the Coronavirus (COVID-19)?  To be considered HIGH RISK for Coronavirus (COVID-19), you have to meet the following criteria:  Traveled to [place], [place], [place], [place] or [place]; or in the [place] to [place], [place], [place]  or [place]; and have fever, cough, and shortness of breath within the last 2 weeks of travel OR Been in close contact with a person diagnosed with COVID-19 within the last 2 weeks and have  fever, cough,and shortness of breath  IF YOU DO NOT MEET THESE CRITERIA, YOU ARE CONSIDERED LOW RISK FOR COVID-19.  What to do if you are HIGH RISK for COVID-19?  If you are having a medical emergency, call 911. Seek medical care right away. Before you go to a doctor's office, urgent care or emergency department,  call ahead and tell them about your recent travel, contact with someone diagnosed with COVID-19   and your symptoms.  You should receive instructions from your physician's office  regarding next steps of care.  When you arrive at healthcare provider, tell the healthcare staff immediately you have returned from  visiting [place], [place], [place], [place] or [place]; or traveled in the [place] to [place], [place],  [place] or [place] in the last two weeks or you have been in close contact with a person diagnosed with  COVID-19 in the last 2 weeks.   Tell the health care staff about your symptoms: fever, cough and shortness of breath. After you have been seen by a medical provider, you will be either: Tested for (COVID-19) and discharged home on quarantine except to seek medical care if  symptoms worsen, and asked to  Stay home and avoid contact with others until you get your results (4-5 days)  Avoid travel on public transportation if possible (such as bus, train, or airplane) or Sent to the Emergency Department by EMS for evaluation, COVID-19 testing  and  possible admission depending on your condition and test results.  What to do if you are LOW RISK for COVID-19?  Reduce your risk of any infection by using the same precautions used for avoiding the common cold or flu:  Wash your hands often with soap and warm water for at least 20 seconds.  If soap and water are not readily available,  use an alcohol-based hand sanitizer with at least 60% alcohol.  If coughing or sneezing, cover your mouth and nose by coughing   or sneezing into the elbow areas of your shirt or coat,  into a tissue or into your sleeve (not your hands). Avoid shaking hands with others and consider head nods or verbal greetings only. Avoid touching your eyes, nose, or mouth with unwashed hands.  Avoid close contact with people who are sick. Avoid places or events with large numbers of people in one location, like concerts or sporting events. Carefully consider travel plans you have or are making. If you are planning any travel outside or inside the [place], visit the CDC's Travelers' Health  webpage for the latest health notices. If you have some symptoms but not all symptoms, continue to monitor at home and seek medical attention  if your symptoms worsen. If you are having a medical emergency, call 911. >>>>>>>>>>>>>>>>>>>>>>>>>>>> Preventive Care for Adults  A healthy lifestyle and preventive care can promote health and wellness. Preventive health guidelines for men include the following key practices: A routine yearly physical is a good way to check with your health care provider about your health and preventative screening. It is a chance to share any concerns and updates on your health and to receive a thorough exam. Visit your dentist for a routine exam and preventative care every 6 months. Brush your teeth twice a day and floss once a day. Good oral hygiene prevents tooth decay and gum disease. The frequency of eye exams is based on your age, health, family medical history, use of contact lenses, and other factors. Follow your health care provider's recommendations for frequency of eye exams. Eat a healthy diet. Foods such as vegetables, fruits, whole grains, low-fat dairy products, and lean protein foods contain the nutrients you need without too many calories. Decrease your intake of foods high in solid fats, added sugars, and salt. Eat the right amount of calories for you. Get information about a proper diet from your health care provider, if necessary. Regular physical exercise is one of the most important things you can do for your health. Most adults should get at least 150 minutes of moderate-intensity exercise (any activity that increases your heart rate and causes you to sweat) each week. In addition, most adults need muscle-strengthening exercises on 2 or more days a week. Maintain a healthy weight. The body mass index (BMI) is a screening tool to identify possible weight problems. It provides an estimate of body fat based on height and weight. Your health care provider can  find your BMI and can help you achieve or maintain a healthy weight. For adults 20 years and older: A BMI below 18.5 is considered underweight. A BMI of 18.5 to 24.9 is normal. A BMI of 25 to 29.9 is considered overweight. A BMI of 30 and above is considered obese. Maintain normal blood lipids and cholesterol levels by exercising and minimizing your intake of saturated fat. Eat a balanced diet with plenty of fruit and vegetables. Blood tests for lipids and cholesterol should begin at age 20 and be repeated every 5 years. If your lipid or cholesterol levels are high, you are over 50, or you are at high risk for heart disease, you may need your cholesterol levels checked more frequently. Ongoing high lipid and cholesterol levels should be treated with medicines if diet and exercise are not working. If you smoke, find out from your health care provider how to quit. If you do not use tobacco, do not start. Lung cancer screening is recommended for adults aged 55-80 years who are at high risk for   developing lung cancer because of a history of smoking. A yearly low-dose CT scan of the lungs is recommended for people who have at least a 30-pack-year history of smoking and are a current smoker or have quit within the past 15 years. A pack year of smoking is smoking an average of 1 pack of cigarettes a day for 1 year (for example: 1 pack a day for 30 years or 2 packs a day for 15 years). Yearly screening should continue until the smoker has stopped smoking for at least 15 years. Yearly screening should be stopped for people who develop a health problem that would prevent them from having lung cancer treatment. If you choose to drink alcohol, do not have more than 2 drinks per day. One drink is considered to be 12 ounces (355 mL) of beer, 5 ounces (148 mL) of wine, or 1.5 ounces (44 mL) of liquor. Avoid use of street drugs. Do not share needles with anyone. Ask for help if you need support or instructions about  stopping the use of drugs. High blood pressure causes heart disease and increases the risk of stroke. Your blood pressure should be checked at least every 1-2 years. Ongoing high blood pressure should be treated with medicines, if weight loss and exercise are not effective. If you are 45-79 years old, ask your health care provider if you should take aspirin to prevent heart disease. Diabetes screening involves taking a blood sample to check your fasting blood sugar level. This should be done once every 3 years, after age 45, if you are within normal weight and without risk factors for diabetes. Testing should be considered at a younger age or be carried out more frequently if you are overweight and have at least 1 risk factor for diabetes. Colorectal cancer can be detected and often prevented. Most routine colorectal cancer screening begins at the age of 50 and continues through age 75. However, your health care provider may recommend screening at an earlier age if you have risk factors for colon cancer. On a yearly basis, your health care provider may provide home test kits to check for hidden blood in the stool. Use of a small camera at the end of a tube to directly examine the colon (sigmoidoscopy or colonoscopy) can detect the earliest forms of colorectal cancer. Talk to your health care provider about this at age 50, when routine screening begins. Direct exam of the colon should be repeated every 5-10 years through age 75, unless early forms of precancerous polyps or small growths are found.  Talk with your health care provider about prostate cancer screening. Testicular cancer screening isrecommended for adult males. Screening includes self-exam, a health care provider exam, and other screening tests. Consult with your health care provider about any symptoms you have or any concerns you have about testicular cancer. Use sunscreen. Apply sunscreen liberally and repeatedly throughout the day. You should  seek shade when your shadow is shorter than you. Protect yourself by wearing long sleeves, pants, a wide-brimmed hat, and sunglasses year round, whenever you are outdoors. Once a month, do a whole-body skin exam, using a mirror to look at the skin on your back. Tell your health care provider about new moles, moles that have irregular borders, moles that are larger than a pencil eraser, or moles that have changed in shape or color. Stay current with required vaccines (immunizations). Influenza vaccine. All adults should be immunized every year. Tetanus, diphtheria, and acellular pertussis (Td, Tdap) vaccine. An   adult who has not previously received Tdap or who does not know his vaccine status should receive 1 dose of Tdap. This initial dose should be followed by tetanus and diphtheria toxoids (Td) booster doses every 10 years. Adults with an unknown or incomplete history of completing a 3-dose immunization series with Td-containing vaccines should begin or complete a primary immunization series including a Tdap dose. Adults should receive a Td booster every 10 years. Varicella vaccine. An adult without evidence of immunity to varicella should receive 2 doses or a second dose if he has previously received 1 dose. Human papillomavirus (HPV) vaccine. Males aged 13-21 years who have not received the vaccine previously should receive the 3-dose series. Males aged 22-26 years may be immunized. Immunization is recommended through the age of 26 years for any male who has sex with males and did not get any or all doses earlier. Immunization is recommended for any person with an immunocompromised condition through the age of 26 years if he did not get any or all doses earlier. During the 3-dose series, the second dose should be obtained 4-8 weeks after the first dose. The third dose should be obtained 24 weeks after the first dose and 16 weeks after the second dose. Zoster vaccine. One dose is recommended for adults  aged 60 years or older unless certain conditions are present.  PREVNAR  - Pneumococcal 13-valent conjugate (PCV13) vaccine. When indicated, a person who is uncertain of his immunization history and has no record of immunization should receive the PCV13 vaccine. An adult aged 19 years or older who has certain medical conditions and has not been previously immunized should receive 1 dose of PCV13 vaccine. This PCV13 should be followed with a dose of pneumococcal polysaccharide (PPSV23) vaccine. The PPSV23 vaccine dose should be obtained at least 1 r more year(s) after the dose of PCV13 vaccine. An adult aged 19 years or older who has certain medical conditions and previously received 1 or more doses of PPSV23 vaccine should receive 1 dose of PCV13. The PCV13 vaccine dose should be obtained 1 or more years after the last PPSV23 vaccine dose.  PNEUMOVAX - Pneumococcal polysaccharide (PPSV23) vaccine. When PCV13 is also indicated, PCV13 should be obtained first. All adults aged 65 years and older should be immunized. An adult younger than age 65 years who has certain medical conditions should be immunized. Any person who resides in a nursing home or long-term care facility should be immunized. An adult smoker should be immunized. People with an immunocompromised condition and certain other conditions should receive both PCV13 and PPSV23 vaccines. People with human immunodeficiency virus (HIV) infection should be immunized as soon as possible after diagnosis. Immunization during chemotherapy or radiation therapy should be avoided. Routine use of PPSV23 vaccine is not recommended for American Indians, [place] Natives, or people younger than 65 years unless there are medical conditions that require PPSV23 vaccine. When indicated, people who have unknown immunization and have no record of immunization should receive PPSV23 vaccine. One-time revaccination 5 years after the first dose of PPSV23 is recommended for people  aged 19-64 years who have chronic kidney failure, nephrotic syndrome, asplenia, or immunocompromised conditions. People who received 1-2 doses of PPSV23 before age 65 years should receive another dose of PPSV23 vaccine at age 65 years or later if at least 5 years have passed since the previous dose. Doses of PPSV23 are not needed for people immunized with PPSV23 at or after age 65 years.  Hepatitis A vaccine.   Adults who wish to be protected from this disease, have certain high-risk conditions, work with hepatitis A-infected animals, work in hepatitis A research labs, or travel to or work in countries with a high rate of hepatitis A should be immunized. Adults who were previously unvaccinated and who anticipate close contact with an international adoptee during the first 60 days after arrival in the [place] from a country with a high rate of hepatitis A should be immunized.  Hepatitis B vaccine. Adults should be immunized if they wish to be protected from this disease, have certain high-risk conditions, may be exposed to blood or other infectious body fluids, are household contacts or sex partners of hepatitis B positive people, are clients or workers in certain care facilities, or travel to or work in countries with a high rate of hepatitis B.  Preventive Service / Frequency  Ages 40 to 64 Blood pressure check. Lipid and cholesterol check Lung cancer screening. / Every year if you are aged 55-80 years and have a 30-pack-year history of smoking and currently smoke or have quit within the past 15 years. Yearly screening is stopped once you have quit smoking for at least 15 years or develop a health problem that would prevent you from having lung cancer treatment. Fecal occult blood test (FOBT) of stool. / Every year beginning at age 50 and continuing until age 75. You may not have to do this test if you get a colonoscopy every 10 years. Flexible sigmoidoscopy** or colonoscopy.** / Every 5 years for  a flexible sigmoidoscopy or every 10 years for a colonoscopy beginning at age 50 and continuing until age 75. Screening for abdominal aortic aneurysm (AAA)  by ultrasound is recommended for people who have history of high blood pressure or who are current or former smokers. +++++++++++ Recommend Adult Low Dose Aspirin or  coated  Aspirin 81 mg daily  To reduce risk of Colon Cancer 40 %,  Skin Cancer 26 % ,  Malignant Melanoma 46%  and  Pancreatic cancer 60% ++++++++++++++++++++ Vitamin D goal  is between 70-100.  Please make sure that you are taking your Vitamin D as directed.  It is very important as a natural anti-inflammatory  helping hair, skin, and nails, as well as reducing stroke and heart attack risk.  It helps your bones and helps with mood. It also decreases numerous cancer risks so please take it as directed.  Low Vit D is associated with a 200-300% higher risk for CANCER  and 200-300% higher risk for HEART   ATTACK  &  STROKE.   ...................................... It is also associated with higher death rate at younger ages,  autoimmune diseases like Rheumatoid arthritis, Lupus, Multiple Sclerosis.    Also many other serious conditions, like depression, Alzheimer's Dementia, infertility, muscle aches, fatigue, fibromyalgia - just to name a few. +++++++++++++++++++++ Recommend the book "The END of DIETING" by Dr Joel Fuhrman  & the book "The END of DIABETES " by Dr Joel Fuhrman At [employer] - get book & Audio CD's    Being diabetic has a  300% increased risk for heart attack, stroke, cancer, and alzheimer- type vascular dementia. It is very important that you work harder with diet by avoiding all foods that are white. Avoid white rice (brown & wild rice is OK), white potatoes (sweetpotatoes in moderation is OK), White bread or wheat bread or anything made out of white flour like bagels, donuts, rolls, buns, biscuits, cakes, pastries, cookies, pizza crust, and pasta (made    from white flour & egg whites) - vegetarian pasta or spinach or wheat pasta is OK. Multigrain breads like Arnold's or [employer], or multigrain sandwich thins or flatbreads.  Diet, exercise and weight loss can reverse and cure diabetes in the early stages.  Diet, exercise and weight loss is very important in the control and prevention of complications of diabetes which affects every system in your body, ie. Brain - dementia/stroke, eyes - glaucoma/blindness, heart - heart attack/heart failure, kidneys - dialysis, stomach - gastric paralysis, intestines - malabsorption, nerves - severe painful neuritis, circulation - gangrene & loss of a leg(s), and finally cancer and Alzheimers.    I recommend avoid fried & greasy foods,  sweets/candy, white rice (brown or wild rice or Quinoa is OK), white potatoes (sweet potatoes are OK) - anything made from white flour - bagels, doughnuts, rolls, buns, biscuits,white and wheat breads, pizza crust and traditional pasta made of white flour & egg white(vegetarian pasta or spinach or wheat pasta is OK).  Multi-grain bread is OK - like multi-grain flat bread or sandwich thins. Avoid alcohol in excess. Exercise is also important.    Eat all the vegetables you want - avoid meat, especially red meat and dairy - especially cheese.  Cheese is the most concentrated form of trans-fats which is the worst thing to clog up our arteries. Veggie cheese is OK which can be found in the fresh produce section at [employer] or [employer] or [employer]  ++++++++++++++++++++++ DASH Eating Plan  DASH stands for "Dietary Approaches to Stop Hypertension."   The DASH eating plan is a healthy eating plan that has been shown to reduce high blood pressure (hypertension). Additional health benefits may include reducing the risk of type 2 diabetes mellitus, heart disease, and stroke. The DASH eating plan may also help with weight loss. WHAT DO I NEED TO KNOW ABOUT THE DASH EATING PLAN? For  the DASH eating plan, you will follow these general guidelines: Choose foods with a percent daily value for sodium of less than 5% (as listed on the food label). Use salt-free seasonings or herbs instead of table salt or sea salt. Check with your health care provider or pharmacist before using salt substitutes. Eat lower-sodium products, often labeled as "lower sodium" or "no salt added." Eat fresh foods. Eat more vegetables, fruits, and low-fat dairy products. Choose whole grains. Look for the word "whole" as the first word in the ingredient list. Choose fish  Limit sweets, desserts, sugars, and sugary drinks. Choose heart-healthy fats. Eat veggie cheese  Eat more home-cooked food and less restaurant, buffet, and fast food. Limit fried foods. Cook foods using methods other than frying. Limit canned vegetables. If you do use them, rinse them well to decrease the sodium. When eating at a restaurant, ask that your food be prepared with less salt, or no salt if possible.                      WHAT FOODS CAN I EAT? Read Dr Joel Fuhrman's books on The End of Dieting & The End of Diabetes  Grains Whole grain or whole wheat bread. Brown rice. Whole grain or whole wheat pasta. Quinoa, bulgur, and whole grain cereals. Low-sodium cereals. Corn or whole wheat flour tortillas. Whole grain cornbread. Whole grain crackers. Low-sodium crackers.  Vegetables Fresh or frozen vegetables (raw, steamed, roasted, or grilled). Low-sodium or reduced-sodium tomato and vegetable juices. Low-sodium or reduced-sodium tomato sauce and paste. Low-sodium or reduced-sodium canned vegetables.     Fruits All fresh, canned (in natural juice), or frozen fruits.  Protein Products  All fish and seafood.  Dried beans, peas, or lentils. Unsalted nuts and seeds. Unsalted canned beans.  Dairy Low-fat dairy products, such as skim or 1% milk, 2% or reduced-fat cheeses, low-fat ricotta or cottage cheese, or plain low-fat yogurt.  Low-sodium or reduced-sodium cheeses.  Fats and Oils Tub margarines without trans fats. Light or reduced-fat mayonnaise and salad dressings (reduced sodium). Avocado. Safflower, olive, or canola oils. Natural peanut or almond butter.  Other Unsalted popcorn and pretzels. The items listed above may not be a complete list of recommended foods or beverages. Contact your dietitian for more options.  +++++++++++++++++++  WHAT FOODS ARE NOT RECOMMENDED? Grains/ White flour or wheat flour White bread. White pasta. White rice. Refined cornbread. Bagels and croissants. Crackers that contain trans fat.  Vegetables  Creamed or fried vegetables. Vegetables in a . Regular canned vegetables. Regular canned tomato sauce and paste. Regular tomato and vegetable juices.  Fruits Dried fruits. Canned fruit in light or heavy syrup. Fruit juice.  Meat and Other Protein Products Meat in general - RED meat & White meat.  Fatty cuts of meat. Ribs, chicken wings, all processed meats as bacon, sausage, bologna, salami, fatback, hot dogs, bratwurst and packaged luncheon meats.  Dairy Whole or 2% milk, cream, half-and-half, and cream cheese. Whole-fat or sweetened yogurt. Full-fat cheeses or blue cheese. Non-dairy creamers and whipped toppings. Processed cheese, cheese spreads, or cheese curds.  Condiments Onion and garlic salt, seasoned salt, table salt, and sea salt. Canned and packaged gravies. Worcestershire sauce. Tartar sauce. Barbecue sauce. Teriyaki sauce. Soy sauce, including reduced sodium. Steak sauce. Fish sauce. Oyster sauce. Cocktail sauce. Horseradish. Ketchup and mustard. Meat flavorings and tenderizers. Bouillon cubes. Hot sauce. Tabasco sauce. Marinades. Taco seasonings. Relishes.  Fats and Oils Butter, stick margarine, lard, shortening and bacon fat. Coconut, palm kernel, or palm oils. Regular salad dressings.  Pickles and olives. Salted popcorn and pretzels.  The items listed above may not  be a complete list of foods and beverages to avoid.   

## 2022-07-18 NOTE — Progress Notes (Signed)
Annual  Screening/Preventative Visit  & Comprehensive Evaluation & Examination   Future Appointments  Date Time Provider Department  07/19/2022                  cpe  2:00 PM Lucky Cowboy, MD [facility]  07/24/2023                   cpe  2:00 PM Lucky Cowboy, MD [facility]            This very nice 57 y.o.  MWM  with HTN, HLD, T2_NIDDM  and Vitamin D Deficiency  presents for a Screening /Preventative Visit & comprehensive evaluation and management of multiple medical co-morbidities.         HTN predates since 20. Patient's BP has been controlled at home.   Patient has hx/o chronic Biventricular Heart Failure & in May 2021, he underwent CABG x 7 V.  He has hx/o chronic combined CHF. Today's BP is at goal   - 130/70 . Patient denies any cardiac symptoms as chest pain, palpitations, shortness of breath, dizziness or ankle swelling.       Patient's hyperlipidemia is controlled with diet and Rosuvastatin. Patient denies myalgias or other medication SE's. Last lipids were at goal :  Lab Results  Component Value Date   CHOL 176 07/04/2022   HDL 67 07/04/2022   LDLCALC 76 07/04/2022         Patient has hx/o T2_NIDDM (2000)  age 51 yo and w/CKD2 (GFR 82) . He is currently treated with Metformin, Jardiance & Ozempic 1 mg . He reports his Endocrinologist,  Dr Loistine Simas with [facility] in [facility] has just done comprehensive labs. .   He has hx/o poor compliance.  Patient denies reactive hypoglycemic symptoms, visual blurring, diabetic polys or paresthesias. Last A1c was not at goal :   Lab Results  Component Value Date   HGBA1C 6.5 (H) 07/04/2022                                        In 2002,  patient underwent  thyroidectomy for MNG  and has been  on thyroid Replacement since then.  Recent  elevated TSH = 7.274 by Dr Allena Katz on 07/03/2012.                                                      Patient has hx/o Low Testosterone ("323" /2011).       Finally, patient has history of  Vitamin D Deficiency ("20" /2017 and "21" /2021) and last vitamin D was still low :    Lab Results  Component Value Date   VD25OH 41 02/08/2021     Current Outpatient Medications on File Prior to Visit  Medication Sig   aspirin EC 81 MG tablet Take daily.    atorvastatin  80 MG tablet Take at bedtime.   carvedilol  12.5 MG tablet Take 1 tablet  2  times daily.   Vitamin D  2000 u 1 tablet   citalopram 40 MG tablet Take  1 tablet  Daily    empagliflozin (JARDIANCE) 25 MG TABS  Take 1 tablet  daily.   levothyroxine  137 MCG tablet 1 TAB  Daily   losartan 100 MG tablet Take daily.   losartan-hctz 100-25 MG tablet Take 1 tablet  every morning.   metFORMIN -XR 500 MG  Take 2 tablets 2 x  /day with Meals for Diabetes    omeprazole (PRILOSEC) 40 MG capsule TAKE ONE DAILY    Reported on 06/10/2021)   Semaglutide,0.25 or 0.5MG /DOS, (OZEMPIC, 0.25 OR 0.5 MG/DOSE,) 2 MG/1.5ML SOPN Inject 1.0 mg into the skin once a week.   spironolactone  25 MG tablet Take 1 tablet daily.    No Known Allergies   Past Medical History:  Diagnosis Date   Aneurysm of ascending aorta 09/24/2020   4.0 cm on echo in 2021.  Will repeat on follow-up.   Anxiety    ASTHMA 10/28/2006   Chronic combined systolic and diastolic heart failure (HCC) 09/24/2020   Coronary artery disease    Cough    Diabetes mellitus type II 2000   age 19   GERD (gastroesophageal reflux disease)    Hyperlipidemia    Hypertension 1995   Hypothyroidism 2002   thyroidectomy for Goiter   Lumbar back pain    NASH (nonalcoholic steatohepatitis)    Numbness    OSA (obstructive sleep apnea) 11/19/2010   Sleep apnea    Pt states mild case, doesn't use cpap     Health Maintenance  Topic Date Due   COVID-19 Vaccine (1) Never done   OPHTHALMOLOGY EXAM  Never done   Zoster Vaccines- Shingrix (1 of 2) Never done   FOOT EXAM  07/12/2021   HEMOGLOBIN A1C  08/08/2021   INFLUENZA VACCINE  11/02/2021   TETANUS/TDAP  08/09/2025   Hepatitis C  Screening  Completed   HIV Screening  Completed   HPV VACCINES  Aged Out     Immunization History  Administered Date(s) Administered   Influenza Inj Mdck Quad With Preservative 02/20/2017   Influenza Split 01/16/2014   PPD Test 10/11/2017, 05/31/2019, 07/13/2020   Pneumococcal Polysaccharide-23 08/16/2012   Tdap 08/10/2015    Last Colon - 06/24/2021 Colonoscopy - Dr Russella Dar                                              - Recc 3 yr f/u for multiple polyps (#8) - due Apr 2027  Past Surgical History:  Procedure Laterality Date   bilateral shoulder surgery     CARDIAC CATHETERIZATION     CORONARY ARTERY BYPASS GRAFT N/A 08/27/2019   Procedure: CORONARY ARTERY BYPASS GRAFTING (CABG) x7, LIMA TO LAD, SVG TO DIAG 1 WITH SEQUENTIAL SVG TO DIAG 2, SVG TO OM2, SVG TO PDA WITH SEQUENTIAL SVG TO PLB WITH SEQUENTIAL SVG TO OTHER.;  Surgeon: Alleen Borne, MD;  Location: [facility] OR;  Service: Open Heart Surgery;  Laterality: N/A;   ENDOVEIN HARVEST OF GREATER SAPHENOUS VEIN Right 08/27/2019   Procedure: Mack Guise Of Greater Saphenous Vein;  Surgeon: Alleen Borne, MD;  Location: [facility] OR;  Service: Open Heart Surgery;  Laterality: Right;   LEFT HEART CATH AND CORONARY ANGIOGRAPHY N/A 08/26/2019   Procedure: LEFT HEART CATH AND CORONARY ANGIOGRAPHY;  Surgeon: Yvonne Kendall, MD;  Location: [facility] INVASIVE CV LAB;  Service: Cardiovascular;  Laterality: N/A;   right knee repair  1997   TEE WITHOUT CARDIOVERSION N/A 08/27/2019   Procedure: TRANSESOPHAGEAL ECHOCARDIOGRAM (TEE);  Surgeon: Alleen Borne, MD;  Location: [facility] OR;  Service: Open Heart Surgery;  Laterality: N/A;   THYROIDECTOMY     right   TRIGGER FINGER RELEASE     TRIGGER FINGER RELEASE  02/23/2011   Procedure: RELEASE TRIGGER FINGER/A-1 PULLEY;  Surgeon: Cammy Copa;  Location: [facility] OR;  Service: Orthopedics;  Laterality: Right;  Right 4th trigger finger release     Family History  Problem Relation Age of Onset   Breast cancer  Mother    Osteoarthritis Father    Heart attack Father    Skin cancer Father    Colon cancer Neg Hx    Esophageal cancer Neg Hx    Rectal cancer Neg Hx    Stomach cancer Neg Hx    Colon polyps Neg Hx      Social History   Marital status: Married       Spouse name: Beth   Number of children: 2   Years of education: 13   Highest education level: Some college, no degree  Occupational History   Occupation: Ecologist   Tobacco Use   Smoking status: Never   Smokeless tobacco: Never  Vaping Use   Vaping Use: Never used  Substance Use Topics   Alcohol use: Yes    Alcohol/week: 0.0 standard drinks    Comment: 20 beers per week   Drug use: Yes    Types: Marijuana      ROS Constitutional: Denies fever, chills, weight loss/gain, headaches, insomnia,  night sweats or change in appetite. Does c/o fatigue. Eyes: Denies redness, blurred vision, diplopia, discharge, itchy or watery eyes.  ENT: Denies discharge, congestion, post nasal drip, epistaxis, sore throat, earache, hearing loss, dental pain, Tinnitus, Vertigo, Sinus pain or snoring.  Cardio: Denies chest pain, palpitations, irregular heartbeat, syncope, dyspnea, diaphoresis, orthopnea, PND, claudication or edema Respiratory: denies cough, dyspnea, DOE, pleurisy, hoarseness, laryngitis or wheezing.  Gastrointestinal: Denies dysphagia, heartburn, reflux, water brash, pain, cramps, nausea, vomiting, bloating, diarrhea, constipation, hematemesis, melena, hematochezia, jaundice or hemorrhoids Genitourinary: Denies dysuria, frequency, urgency, nocturia, hesitancy, discharge, hematuria or flank pain Musculoskeletal: Denies arthralgia, myalgia, stiffness, Jt. Swelling, pain, limp or strain/sprain. Denies Falls. Skin: Denies puritis, rash, hives, warts, acne, eczema or change in skin lesion Neuro: No weakness, tremor, incoordination, spasms, paresthesia or pain Psychiatric: Denies confusion, memory loss or sensory  loss. Denies Depression. Endocrine: Denies change in weight, skin, hair change, nocturia, and paresthesia, diabetic polys, visual blurring or hyper / hypo glycemic episodes.  Heme/Lymph: No excessive bleeding, bruising or enlarged lymph nodes.   Physical Exam  BP 130/70   Pulse 80   Temp 97.9 F (36.6 C)   Resp 16   Ht 5\' 10"  (1.778 m)   Wt 200 lb (90.7 kg)   SpO2 97%   BMI 28.70 kg/m   General Appearance:   Over nourished and in no apparent distress.  Eyes: PERRLA, EOMs, conjunctiva no swelling or erythema, normal fundi and vessels. Sinuses: No frontal/maxillary tenderness ENT/Mouth: EACs patent / TMs  nl. Nares clear without erythema, swelling, mucoid exudates. Oral hygiene is good. No erythema, swelling, or exudate. Tongue normal, non-obstructing. Tonsils not swollen or erythematous. Hearing normal.  Neck: Supple, thyroid not palpable. No bruits, nodes or JVD. Respiratory: Respiratory effort normal.  BS equal and clear bilateral without rales, rhonci, wheezing or stridor. Cardio: Heart sounds are normal with regular rate and rhythm and no murmurs, rubs or gallops. Peripheral pulses are normal and equal bilaterally without edema. No aortic or femoral bruits. Chest: symmetric with normal excursions  and percussion.  Abdomen: Soft, with Nl bowel sounds. Nontender, no guarding, rebound, hernias, masses, or organomegaly.  Lymphatics: Non tender without lymphadenopathy.  Musculoskeletal: Full ROM all peripheral extremities, joint stability, 5/5 strength, and normal gait. Skin: Warm and dry without rashes, lesions, cyanosis, clubbing or  ecchymosis.  Neuro: Cranial nerves intact, reflexes equal bilaterally. Normal muscle tone, no cerebellar symptoms. Sensation intact.  Pysch: Alert and oriented X 3 with normal affect, insight and judgment appropriate.   Assessment and Plan  1. Annual Preventative/Screening Exam    2. Essential hypertension  - EKG 12-Lead - [place], RETROPERITNL ABD,   LTD  3. Hyperlipidemia associated with type 2 diabetes mellitus  - EKG 12-Lead - [place], RETROPERITNL ABD,  LTD  4. Type 2 diabetes mellitus with stage 2 chronic kidney  disease, without long-term current use of insulin  - EKG 12-Lead - [place], RETROPERITNL ABD,  LTD - [facility] DIABETES FOOT EXAM - PR LOW EXTEMITY NEUR EXAM DOCUM  5. Vitamin D deficiency  - VITAMIN D 25 Hydroxy   6. Hypothyroidism  7. Testosterone deficiency   8. S/P CABG x 7  - EKG 12-Lead  9. Chronic combined systolic and diastolic CHF (congestive heart failure)   10. Class 2 severe obesity due to excess calories with  serious comorbidity in adult, unspecified BMI   11. OSA (obstructive sleep apnea)   12. Screening examination for pulmonary tuberculosis   13. Screening for prostate cancer  - PSA  14. Screening for heart disease  - EKG 12-Lead  15. FHx: heart disease  - EKG 12-Lead - [place], RETROPERITNL ABD,  LTD  16. Screening for AAA (aortic abdominal aneurysm)  - [place], RETROPERITNL ABD,  LTD  17. Fatigue, unspecified type  - Vitamin B12 - Iron, Total/Total Iron Binding Cap - Testosterone  18. Medication management  - Vitamin B12 - Iron, Total/Total Iron Binding Cap - PSA - Testosterone  19. Screening for colorectal cancer  - POC Hemoccult Bld/Stl           Patient was counseled in prudent diet, weight control to achieve/maintain BMI less than 25, BP monitoring, regular exercise and medications as discussed.  Discussed med effects and SE's. Routine screening labs and tests as requested with regular follow-up as recommended. Over 40 minutes of exam, counseling, chart review and high complex critical decision making was performed   Marinus Maw, MD

## 2022-07-19 ENCOUNTER — Encounter: Payer: Self-pay | Admitting: Internal Medicine

## 2022-07-19 ENCOUNTER — Ambulatory Visit: Payer: Managed Care, Other (non HMO) | Admitting: Internal Medicine

## 2022-07-19 VITALS — BP 130/70 | HR 80 | Temp 97.9°F | Resp 16 | Ht 70.0 in | Wt 200.0 lb

## 2022-07-19 DIAGNOSIS — R5383 Other fatigue: Secondary | ICD-10-CM

## 2022-07-19 DIAGNOSIS — Z125 Encounter for screening for malignant neoplasm of prostate: Secondary | ICD-10-CM

## 2022-07-19 DIAGNOSIS — Z1329 Encounter for screening for other suspected endocrine disorder: Secondary | ICD-10-CM

## 2022-07-19 DIAGNOSIS — Z136 Encounter for screening for cardiovascular disorders: Secondary | ICD-10-CM

## 2022-07-19 DIAGNOSIS — E1169 Type 2 diabetes mellitus with other specified complication: Secondary | ICD-10-CM

## 2022-07-19 DIAGNOSIS — I700 Atherosclerosis of aorta: Secondary | ICD-10-CM | POA: Diagnosis not present

## 2022-07-19 DIAGNOSIS — N401 Enlarged prostate with lower urinary tract symptoms: Secondary | ICD-10-CM

## 2022-07-19 DIAGNOSIS — Z951 Presence of aortocoronary bypass graft: Secondary | ICD-10-CM

## 2022-07-19 DIAGNOSIS — E1122 Type 2 diabetes mellitus with diabetic chronic kidney disease: Secondary | ICD-10-CM

## 2022-07-19 DIAGNOSIS — Z0000 Encounter for general adult medical examination without abnormal findings: Secondary | ICD-10-CM | POA: Diagnosis not present

## 2022-07-19 DIAGNOSIS — R350 Frequency of micturition: Secondary | ICD-10-CM | POA: Diagnosis not present

## 2022-07-19 DIAGNOSIS — Z1211 Encounter for screening for malignant neoplasm of colon: Secondary | ICD-10-CM

## 2022-07-19 DIAGNOSIS — Z130 Encounter for screening for diseases of the blood and blood-forming organs and certain disorders involving the immune mechanism: Secondary | ICD-10-CM

## 2022-07-19 DIAGNOSIS — G4733 Obstructive sleep apnea (adult) (pediatric): Secondary | ICD-10-CM

## 2022-07-19 DIAGNOSIS — E66812 Obesity, class 2: Secondary | ICD-10-CM

## 2022-07-19 DIAGNOSIS — I10 Essential (primary) hypertension: Secondary | ICD-10-CM

## 2022-07-19 DIAGNOSIS — Z79899 Other long term (current) drug therapy: Secondary | ICD-10-CM

## 2022-07-19 DIAGNOSIS — E559 Vitamin D deficiency, unspecified: Secondary | ICD-10-CM

## 2022-07-19 DIAGNOSIS — Z0001 Encounter for general adult medical examination with abnormal findings: Secondary | ICD-10-CM

## 2022-07-19 DIAGNOSIS — Z111 Encounter for screening for respiratory tuberculosis: Secondary | ICD-10-CM

## 2022-07-19 DIAGNOSIS — Z8249 Family history of ischemic heart disease and other diseases of the circulatory system: Secondary | ICD-10-CM

## 2022-07-19 DIAGNOSIS — I5042 Chronic combined systolic (congestive) and diastolic (congestive) heart failure: Secondary | ICD-10-CM

## 2022-07-19 DIAGNOSIS — E349 Endocrine disorder, unspecified: Secondary | ICD-10-CM

## 2022-07-19 DIAGNOSIS — E039 Hypothyroidism, unspecified: Secondary | ICD-10-CM

## 2022-07-20 ENCOUNTER — Ambulatory Visit (HOSPITAL_BASED_OUTPATIENT_CLINIC_OR_DEPARTMENT_OTHER): Payer: Managed Care, Other (non HMO)

## 2022-07-20 DIAGNOSIS — I7121 Aneurysm of the ascending aorta, without rupture: Secondary | ICD-10-CM

## 2022-07-20 LAB — ECHOCARDIOGRAM COMPLETE
Area-P 1/2: 3.72 cm2
S' Lateral: 1.8 cm

## 2022-07-20 LAB — VITAMIN B12: Vitamin B-12: 348 pg/mL (ref 200–1100)

## 2022-07-20 LAB — PSA: PSA: 0.8 ng/mL (ref <=4.00)

## 2022-07-20 LAB — IRON, TOTAL/TOTAL IRON BINDING CAP
%SAT: 25 % (calc) (ref 20–48)
Iron: 91 ug/dL (ref 50–180)
TIBC: 361 mcg/dL (calc) (ref 250–425)

## 2022-07-20 LAB — VITAMIN D 25 HYDROXY (VIT D DEFICIENCY, FRACTURES): Vit D, 25-Hydroxy: 58 ng/mL (ref 30–100)

## 2022-07-20 LAB — TESTOSTERONE: Testosterone: 658 ng/dL (ref 250–827)

## 2022-07-20 NOTE — Progress Notes (Signed)
^<^<^<^<^<^<^<^<^<^<^<^<^<^<^<^<^<^<^<^<^<^<^<^<^<^<^<^<^<^<^<^<^<^<^<^<^ ^>^>^>^>^>^>^>^>^>^>^>>^>^>^>^>^>^>^>^>^>^>^>^>^>^>^>^>^>^>^>^>^>^>^>^>^ -  Test results slightly outside the reference range are not unusual. If there is anything important, I will review this with you,  otherwise it is considered normal test values.  If you have further questions,  please do not hesitate to contact me at the office or via My Chart.  ^<^<^<^<^<^<^<^<^<^<^<^<^<^<^<^<^<^<^<^<^<^<^<^<^<^<^<^<^<^<^<^<^<^<^<^<^ ^>^>^>^>^>^>^>^>^>^>^>^>^>^>^>^>^>^>^>^>^>^>^>^>^>^>^>^>^>^>^>^>^>^>^>^>^  -  Vitamin B12 =   348     is STILL Very Low                                  (Ideal or Goal Vit B12 is between 450 - 1,100)    - Low Vit B12 may be associated with Anemia , Fatigue,   Peripheral Neuropathy, Dementia, "Brain Fog", & Depression   - Recommend take a sub-lingual form of                        Vitamin B12 tablet  1,000 to 5,000 mcg tab that                                                                     you dissolve under your tongue /Daily   - Can get Lavonia Dana - best price at [employer] or on [employer] ^>^>^>^>^>^>^>^>^>^>^>^>^>^>^>^>^>^>^>^>^>^>^>^>^>^>^>^>^>^>^>^>^>^>^>^>^  -  PSA - Low - No Prostate Cancer  -  Great ! ^>^>^>^>^>^>^>^>^>^>^>^>^>^>^>^>^>^>^>^>^>^>^>^>^>^>^>^>^>^>^>^>^>^>^>^>^  - Testosterone remains Normal - Great  ! ^>^>^>^>^>^>^>^>^>^>^>^>^>^>^>^>^>^>^>^>^>^>^>^>^>^>^>^>^>^>^>^>^>^>^>^>^  - Vitamin D - Sl LOW -   - Vitamin D goal is between 70-100.   - Please INCREASE  your Vitamin D up to 5,000 unit capsule  DAILY   - It is very important as a natural anti-inflammatory and helping the  immune system protect against viral infections, like the Covid-19    helping hair, skin, and nails, as well as reducing stroke and  heart attack risk.   - It helps your bones and helps with mood.  - It also decreases numerous cancer risks so please  take it as directed.   - Low Vit  D is associated with a 200-300% higher risk for          CANCER   and 200-300% higher risk for HEART   ATTACK  &  STROKE.    - It is also associated with higher death rate at younger ages,   autoimmune diseases like Rheumatoid arthritis, Lupus, Multiple Sclerosis.     - Also many other serious conditions, like depression,                      Alzheimer's Dementia,  muscle aches, fatigue, fibromyalgia   ^>^>^>^>^>^>^>^>^>^>^>^>^>^>^>^>^>^>^>^>^>^>^>^>^>^>^>^>^>^>^>^>^>^>^>^>^  - Iron levels are Normal & OK  ^>^>^>^>^>^>^>^>^>^>^>^>^>^>^>^>^>^>^>^>^>^>^>^>^>^>^>^>^>^>^>^>^>^>^>^>^

## 2022-07-24 ENCOUNTER — Encounter: Payer: Self-pay | Admitting: Internal Medicine

## 2022-07-29 ENCOUNTER — Telehealth (HOSPITAL_BASED_OUTPATIENT_CLINIC_OR_DEPARTMENT_OTHER): Payer: Self-pay

## 2022-07-29 NOTE — Telephone Encounter (Addendum)
Results called to patient who verbalizes understanding! Patient has follow up scheduled with Gillian Shields, NP on 6/11.    ----- Message from Alver Sorrow, NP sent at 07/28/2022 12:04 PM EDT ----- Echocardiogram with normal heart pumping function.  Heart muscle mildly thick which we prevent from worsening by keeping blood pressure controlled.  No significant valvular abnormalities.  Needs to schedule overdue follow-up.

## 2022-09-13 ENCOUNTER — Ambulatory Visit (HOSPITAL_BASED_OUTPATIENT_CLINIC_OR_DEPARTMENT_OTHER): Payer: Managed Care, Other (non HMO) | Admitting: Family

## 2022-10-18 ENCOUNTER — Ambulatory Visit: Payer: Managed Care, Other (non HMO) | Admitting: Nurse Practitioner

## 2022-10-18 NOTE — Progress Notes (Deleted)
FOLLOW UP  Assessment and Plan:   Essential hypertension Controlled  Continue medications;  Discussed DASH (Dietary Approaches to Stop Hypertension) DASH diet is lower in sodium than a typical American diet. Cut back on foods that are high in saturated fat, cholesterol, and trans fats. Eat more whole-grain foods, fish, poultry, and nuts Remain active and exercise as tolerated daily.  Monitor BP at home-Call if greater than 130/80.   Type 2 diabetes mellitus without complication, with long-term current use of insulin (HCC) Just had A1C checked with Endo last week 09/20/2021:  Results 6.4% Down from 6.6% in April. Continue Ozempic. Education: Reviewed 'ABCs' of diabetes management  A1C (<7) Blood pressure (<130/80) Cholesterol (LDL <70) Continue Eye Exam yearly  Continue Dental Exam Q6 mo Discussed dietary recommendations Discussed Physical Activity recommendations  Acquired hypothyroidism Elevated TSH >7 during 07/2021 check-up Saw Endocrinology last week 10/20/21 and levels resulted to WNL. Continue to monitor  Medication management All medications discussed and reviewed in full. All questions and concerns regarding medications addressed.      Continue diet and meds as discussed. Further disposition pending results of labs. Discussed med's effects and SE's.    Over 20 minutes of exam, counseling, chart review, and critical decision making was performed.   Future Appointments  Date Time Provider Department Center  11/03/2022  2:30 PM Adela Glimpse, NP [facility] None  01/19/2023  2:30 PM Lucky Cowboy, MD [facility] None  07/24/2023  2:00 PM Lucky Cowboy, MD [facility] None    ----------------------------------------------------------------------------------------------------------------------  HPI 57 y.o. male  presents for 3 month follow up on hypertension, cholesterol, diabetes, weight and vitamin D deficiency.   He reports today that during last check up on  07/2021 his A1C and TSH levels were elevated.  He followed up with Endocrinology last week 10/20/21 with reports of A1C decreasing and TSH levels now normalized.  He has no additional concerns in clinic today.  BMI is There is no height or weight on file to calculate BMI., he has been working on diet and exercise.  Continues on Ozempic. Wt Readings from Last 3 Encounters:  07/19/22 200 lb (90.7 kg)  10/27/21 202 lb (91.6 kg)  07/19/21 202 lb 3.2 oz (91.7 kg)    His blood pressure has been controlled at home, today their BP is    He does workout. He denies chest pain, shortness of breath, dizziness.  He has been working on diet and exercise for prediabetes, and denies polydipsia and polyuria. Last A1C in the office was:  Lab Results  Component Value Date   HGBA1C 6.6 (H) 07/14/2021  He was seen at Endocrinology last week 10/21/19 and A1C levels decreased to 6.4%.  Patient is on Vitamin D supplement.   Lab Results  Component Value Date   VD25OH 58 07/19/2022      TSH levels from [facility]inology resulted to 3.19.  Current Medications:  Current Outpatient Medications on File Prior to Visit  Medication Sig   aspirin EC 81 MG tablet Take 81 mg by mouth daily. Swallow whole.   atorvastatin (LIPITOR) 80 MG tablet Take 80 mg by mouth at bedtime.   blood glucose meter kit and supplies KIT Dispense based on patient and insurance preference. Use up to 3 times daily as directed. (FOR ICD-10-E11.9 and Z79.4). (Patient taking differently: Inject 1 each into the skin See admin instructions. Dispense based on patient and insurance preference. Use up to 3 times daily as directed. (FOR ICD-10-E11.9 and Z79.4).)   Blood Pressure  KIT Monitor blood pressure daily   carvedilol (COREG) 12.5 MG tablet Take 1 tablet (12.5 mg total) by mouth 2 (two) times daily.   Cholecalciferol 50 MCG (2000 UT) CAPS 1 tablet   citalopram (CELEXA) 40 MG tablet Take 1 tablet  Daily for Mood & Anxiety    Continuous Blood Gluc Sensor (FREESTYLE LIBRE 2 SENSOR) MISC Please supply one box and 6 refills. Use as directed (Patient taking differently: 1 each by Other route See admin instructions. Please supply one box and 6 refills. Use as directed)   empagliflozin (JARDIANCE) 25 MG TABS tablet Take 1 tablet by mouth daily.   levothyroxine (SYNTHROID) 150 MCG tablet Take 1 tablet Daily on an empty stomach with only water for 30 minutes & no Antacid meds, Calcium or Magnesium for 4 hours & avoid Biotin   losartan-hydrochlorothiazide (HYZAAR) 100-25 MG tablet Take 1 tablet by mouth every morning.   metFORMIN (GLUCOPHAGE-XR) 500 MG 24 hr tablet Take 2 tablets 2 x  /day with Meals for Diabetes (Patient taking differently: Take 1,000 mg by mouth 2 (two) times daily with a meal.)   omeprazole (PRILOSEC) 40 MG capsule Take  1 capsule  Daily  To Prevent Heartburn & Indigestion                                   /                 TAKE                                   BY                  MOUTH                      ONE TIME DAILY   OZEMPIC, 1 MG/DOSE, 4 MG/3ML SOPN Inject 1 mg into the skin once a week.   rosuvastatin (CRESTOR) 40 MG tablet Take  1 tablet  Daily  for Cholesterol   spironolactone (ALDACTONE) 25 MG tablet TAKE ONE TABLET BY MOUTH DAILY   vitamin C (ASCORBIC ACID) 500 MG tablet Take 500 mg by mouth daily.   No current facility-administered medications on file prior to visit.     Allergies: Not on File   Medical History:  Past Medical History:  Diagnosis Date   Aneurysm of ascending aorta (HCC) 09/24/2020   4.0 cm on echo in 2021.  Will repeat on follow-up.   Anxiety    ASTHMA 10/28/2006   Chronic combined systolic and diastolic heart failure (HCC) 09/24/2020   Coronary artery disease    Cough    Diabetes mellitus type II 2000   age 16   GERD (gastroesophageal reflux disease)    Hyperlipidemia    Hypertension 1995   Hypothyroidism 2002   thyroidectomy for Goiter   Lumbar back pain    NASH  (nonalcoholic steatohepatitis)    Numbness    OSA (obstructive sleep apnea) 11/19/2010   Sleep apnea    Pt states mild case, doesn't use cpap   Family history- Reviewed and unchanged Social history- Reviewed and unchanged   Review of Systems: All review systems reviewed and negative except for pertinent positives in history of present illness.   Physical Exam: There were no vitals taken for this visit. Wt Readings from Last  3 Encounters:  07/19/22 200 lb (90.7 kg)  10/27/21 202 lb (91.6 kg)  07/19/21 202 lb 3.2 oz (91.7 kg)   General Appearance: Well nourished, in no apparent distress. Eyes: PERRLA, EOMs, conjunctiva no swelling or erythema Sinuses: No Frontal/maxillary tenderness ENT/Mouth: Ext aud canals clear, TMs without erythema, bulging. No erythema, swelling, or exudate on post pharynx.  Tonsils not swollen or erythematous. Hearing normal.  Neck: Supple, thyroid normal.  Respiratory: Respiratory effort normal, BS equal bilaterally without rales, rhonchi, wheezing or stridor.  Cardio: RRR with no MRGs. Brisk peripheral pulses without edema.  Abdomen: Soft, + BS.  Non tender, no guarding, rebound, hernias, masses. Lymphatics: Non tender without lymphadenopathy.  Musculoskeletal: Full ROM, 5/5 strength, Normal gait Skin: Warm, dry without rashes, lesions, ecchymosis.  Neuro: Cranial nerves intact. No cerebellar symptoms.  Psych: Awake and oriented X 3, normal affect, Insight and Judgment appropriate.    Adela Glimpse, NP 2:21 PM [facility]

## 2022-11-03 ENCOUNTER — Ambulatory Visit: Payer: Managed Care, Other (non HMO) | Admitting: Nurse Practitioner

## 2022-11-03 ENCOUNTER — Encounter: Payer: Self-pay | Admitting: Nurse Practitioner

## 2022-11-03 VITALS — BP 112/80 | HR 83 | Temp 98.1°F | Ht 70.0 in | Wt 203.6 lb

## 2022-11-03 DIAGNOSIS — Z79899 Other long term (current) drug therapy: Secondary | ICD-10-CM

## 2022-11-03 DIAGNOSIS — I5042 Chronic combined systolic (congestive) and diastolic (congestive) heart failure: Secondary | ICD-10-CM

## 2022-11-03 DIAGNOSIS — Z951 Presence of aortocoronary bypass graft: Secondary | ICD-10-CM

## 2022-11-03 DIAGNOSIS — E039 Hypothyroidism, unspecified: Secondary | ICD-10-CM

## 2022-11-03 DIAGNOSIS — N182 Chronic kidney disease, stage 2 (mild): Secondary | ICD-10-CM

## 2022-11-03 DIAGNOSIS — K7581 Nonalcoholic steatohepatitis (NASH): Secondary | ICD-10-CM

## 2022-11-03 DIAGNOSIS — G4733 Obstructive sleep apnea (adult) (pediatric): Secondary | ICD-10-CM

## 2022-11-03 DIAGNOSIS — I10 Essential (primary) hypertension: Secondary | ICD-10-CM | POA: Diagnosis not present

## 2022-11-03 DIAGNOSIS — E785 Hyperlipidemia, unspecified: Secondary | ICD-10-CM | POA: Diagnosis not present

## 2022-11-03 DIAGNOSIS — E1122 Type 2 diabetes mellitus with diabetic chronic kidney disease: Secondary | ICD-10-CM | POA: Diagnosis not present

## 2022-11-03 DIAGNOSIS — E1169 Type 2 diabetes mellitus with other specified complication: Secondary | ICD-10-CM

## 2022-11-03 NOTE — Patient Instructions (Signed)

## 2022-11-03 NOTE — Progress Notes (Signed)
FOLLOW UP  Assessment and Plan:   Essential hypertension Controlled  Continue medications;  Discussed DASH (Dietary Approaches to Stop Hypertension) DASH diet is lower in sodium than a typical American diet. Cut back on foods that are high in saturated fat, cholesterol, and trans fats. Eat more whole-grain foods, fish, poultry, and nuts Remain active and exercise as tolerated daily.  Monitor BP at home-Call if greater than 130/80.   Type 2 diabetes mellitus without complication, with long-term current use of insulin [facility]) Education: Reviewed 'ABCs' of diabetes management  A1C (<7) Blood pressure (<130/80) Cholesterol (LDL <70) Continue Eye Exam yearly  Continue Dental Exam Q6 mo Discussed dietary recommendations Discussed Physical Activity recommendations  Acquired hypothyroidism Endocrinology following Continue Levothyroxine. Reminded to take on an empty stomach 30-37mins before food.  Stop any Biotin Supplement 48-72 hours before next TSH level to reduce the risk of falsely low TSH levels. Continue to monitor.    Medication management All medications discussed and reviewed in full. All questions and concerns regarding medications addressed.    Hyperlipidemia Discussed lifestyle modifications. Recommended diet heavy in fruits and veggies, omega 3's. Decrease consumption of animal meats, cheeses, and dairy products. Remain active and exercise as tolerated. Continue to monitor. Check lipids/TSH  S/P CABG x7/Heart failure Continue to follow with Cardiology  OSA Continue CPAP  Obesity Discussed appropriate BMI Diet modification. Physical activity. Encouraged/praised to build confidence.  NASH Monitor LFTs  Orders Placed This Encounter  Procedures   CBC with Differential/Platelet   COMPLETE METABOLIC PANEL WITH GFR   Lipid panel   Hemoglobin A1c   TSH    Notify office for further evaluation and treatment, questions or concerns if any reported s/s fail to  improve.   The patient was advised to call back or seek an in-person evaluation if any symptoms worsen or if the condition fails to improve as anticipated.   Further disposition pending results of labs. Discussed med's effects and SE's.    I discussed the assessment and treatment plan with the patient. The patient was provided an opportunity to ask questions and all were answered. The patient agreed with the plan and demonstrated an understanding of the instructions.  Discussed med's effects and SE's. Screening labs and tests as requested with regular follow-up as recommended.  I provided 25 minutes of face-to-face time during this encounter including counseling, chart review, and critical decision making was preformed.  Today's Plan of Care is based on a patient-centered health care approach known as shared decision making - the decisions, tests and treatments allow for patient preferences and values to be balanced with clinical evidence.    Future Appointments  Date Time Provider Department Center  01/19/2023  2:30 PM Lucky Cowboy, MD [facility] None  07/24/2023  2:00 PM Lucky Cowboy, MD [facility] None    ----------------------------------------------------------------------------------------------------------------------  HPI 57 y.o. male  presents for 3 month follow up on hypertension, cholesterol, diabetes, weight and vitamin D deficiency.   Overall he reports feeling well today.  He has no new concerns at this time.  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 He was initially seen in the hospital 08/2019 with chest pain in the setting of NSTEMI. He presented to med [facility] and was transferred to [facility]. High-sensitivity troponin was over 13,000. He underwent left heart catheterization  08/26/2019 that revealed diffuse three-vessel disease, including  90% left main disease. He subsequently underwent CABG (LIMA--> LAD, SVG-->D1/D2, SVG-->PDA, PL1, PL2) with Dr. Laneta Simmers. Echo that admission revealed LVEF 40 to 45% with grade 2 diastolic dysfunction. LVEF improved to 60-65% on echo 9/021. He was seen in the hospital 09/2019 with chest pain and dizziness. He was hypotensive to the 70s systolic. He elected not to participate in cardiac rehab. He is back working at the [employer].  He continues to feel well and is exercising routinely.   BMI is Body mass index is 29.21 kg/m., he has been working on diet and exercise.  Continues on Ozempic. Wt Readings from Last 3 Encounters:  11/03/22 203 lb 9.6 oz (92.4 kg)  07/19/22 200 lb (90.7 kg)  10/27/21 202 lb (91.6 kg)    His blood pressure has been controlled at home, today their BP is BP: 112/80  He does workout. He denies chest pain, shortness of breath, dizziness.  He has been working on diet and exercise for prediabetes, and denies polydipsia and polyuria. Last A1C in the office was:  Lab Results  Component Value Date   HGBA1C 6.6 (H) 07/14/2021  He was seen at Endocrinology last week 10/21/19 and A1C levels decreased to 6.4%. Continues to take Ozempic, metformin and Jardiance.  Denies hypoglycemia.  Has freestyle libre but does not use regularly.   Patient is on Vitamin D supplement.   Lab Results  Component Value Date   VD25OH 58 07/19/2022      TSH levels from [facility]inology resulted to 3.19.  Denies tremors, anxiety or palpitations, constipation or dryness of skin.  Has OSA and compliant with CPAP  Current Medications:  Current Outpatient Medications on File Prior to Visit  Medication Sig   aspirin EC 81 MG tablet Take 81 mg by mouth daily. Swallow whole.   atorvastatin (LIPITOR) 80 MG tablet Take 80 mg by mouth at bedtime.   blood glucose meter kit and supplies KIT Dispense based on patient and  insurance preference. Use up to 3 times daily as directed. (FOR ICD-10-E11.9 and Z79.4). (Patient taking differently: Inject 1 each into the skin See admin instructions. Dispense based on patient and insurance preference. Use up to 3 times daily as directed. (FOR ICD-10-E11.9 and Z79.4).)   Blood Pressure KIT Monitor blood pressure daily   Cholecalciferol 50 MCG (2000 UT) CAPS 1 tablet   citalopram (CELEXA) 40 MG tablet Take 1 tablet  Daily for Mood & Anxiety   Continuous Blood Gluc Sensor (FREESTYLE LIBRE 2 SENSOR) MISC Please supply one box and 6 refills. Use as directed (Patient taking differently: 1 each by Other route See admin instructions. Please supply one box and 6 refills. Use as directed)   empagliflozin (JARDIANCE) 25 MG TABS tablet Take 1 tablet by mouth daily.   levothyroxine (SYNTHROID) 150 MCG tablet Take 1 tablet Daily on an empty stomach with only water for 30 minutes & no Antacid meds, Calcium or Magnesium for 4 hours & avoid Biotin   losartan-hydrochlorothiazide (HYZAAR) 100-25 MG tablet Take 1 tablet by mouth every morning.   metFORMIN (GLUCOPHAGE-XR) 500 MG 24 hr tablet Take 2 tablets 2 x  /day with Meals for Diabetes (Patient taking differently: Take 1,000 mg by mouth 2 (two) times daily with a meal.)   omeprazole (PRILOSEC) 40 MG capsule Take  1 capsule  Daily  To Prevent Heartburn & Indigestion                                   /  TAKE                                   BY                  MOUTH                      ONE TIME DAILY   OZEMPIC, 1 MG/DOSE, 4 MG/3ML SOPN Inject 1 mg into the skin once a week.   rosuvastatin (CRESTOR) 40 MG tablet Take  1 tablet  Daily  for Cholesterol   spironolactone (ALDACTONE) 25 MG tablet TAKE ONE TABLET BY MOUTH DAILY   carvedilol (COREG) 12.5 MG tablet Take 1 tablet (12.5 mg total) by mouth 2 (two) times daily.   vitamin C (ASCORBIC ACID) 500 MG tablet Take 500 mg by mouth daily. (Patient not taking: Reported on 11/03/2022)   No  current facility-administered medications on file prior to visit.     Allergies: Not on File   Medical History:  Past Medical History:  Diagnosis Date   Aneurysm of ascending aorta (HCC) 09/24/2020   4.0 cm on echo in 2021.  Will repeat on follow-up.   Anxiety    ASTHMA 10/28/2006   Chronic combined systolic and diastolic heart failure (HCC) 09/24/2020   Coronary artery disease    Cough    Diabetes mellitus type II 2000   age 76   GERD (gastroesophageal reflux disease)    Hyperlipidemia    Hypertension 1995   Hypothyroidism 2002   thyroidectomy for Goiter   Lumbar back pain    NASH (nonalcoholic steatohepatitis)    Numbness    OSA (obstructive sleep apnea) 11/19/2010   Sleep apnea    Pt states mild case, doesn't use cpap   Family history- Reviewed and unchanged Social history- Reviewed and unchanged   Review of Systems: All review systems reviewed and negative except for pertinent positives in history of present illness.   Physical Exam: BP 112/80   Pulse 83   Temp 98.1 F (36.7 C)   Ht 5\' 10"  (1.778 m)   Wt 203 lb 9.6 oz (92.4 kg)   SpO2 98%   BMI 29.21 kg/m  Wt Readings from Last 3 Encounters:  11/03/22 203 lb 9.6 oz (92.4 kg)  07/19/22 200 lb (90.7 kg)  10/27/21 202 lb (91.6 kg)   General Appearance: Well nourished, in no apparent distress. Eyes: PERRLA, EOMs, conjunctiva no swelling or erythema Sinuses: No Frontal/maxillary tenderness ENT/Mouth: Ext aud canals clear, TMs without erythema, bulging. No erythema, swelling, or exudate on post pharynx.  Tonsils not swollen or erythematous. Hearing normal.  Neck: Supple, thyroid normal.  Respiratory: Respiratory effort normal, BS equal bilaterally without rales, rhonchi, wheezing or stridor.  Cardio: RRR with no MRGs. Brisk peripheral pulses without edema.  Abdomen: Soft, + BS.  Non tender, no guarding, rebound, hernias, masses. Lymphatics: Non tender without lymphadenopathy.  Musculoskeletal: Full ROM, 5/5  strength, Normal gait Skin: Warm, dry without rashes, lesions, ecchymosis.  Neuro: Cranial nerves intact. No cerebellar symptoms.  Psych: Awake and oriented X 3, normal affect, Insight and Judgment appropriate.    Adela Glimpse, NP 2:40 PM [facility]

## 2022-11-26 ENCOUNTER — Other Ambulatory Visit: Payer: Self-pay | Admitting: Internal Medicine

## 2022-11-26 DIAGNOSIS — K2100 Gastro-esophageal reflux disease with esophagitis, without bleeding: Secondary | ICD-10-CM

## 2022-11-26 MED ORDER — OMEPRAZOLE 40 MG PO CPDR: DELAYED_RELEASE_CAPSULE | ORAL | 3 refills | Status: DC

## 2022-12-17 ENCOUNTER — Other Ambulatory Visit: Payer: Self-pay | Admitting: Internal Medicine

## 2022-12-17 DIAGNOSIS — K2100 Gastro-esophageal reflux disease with esophagitis, without bleeding: Secondary | ICD-10-CM

## 2022-12-18 ENCOUNTER — Encounter: Payer: Self-pay | Admitting: Internal Medicine

## 2022-12-18 ENCOUNTER — Other Ambulatory Visit: Payer: Self-pay | Admitting: Internal Medicine

## 2022-12-18 DIAGNOSIS — E039 Hypothyroidism, unspecified: Secondary | ICD-10-CM

## 2022-12-18 DIAGNOSIS — I5042 Chronic combined systolic (congestive) and diastolic (congestive) heart failure: Secondary | ICD-10-CM

## 2022-12-18 DIAGNOSIS — I25110 Atherosclerotic heart disease of native coronary artery with unstable angina pectoris: Secondary | ICD-10-CM

## 2022-12-18 DIAGNOSIS — K2100 Gastro-esophageal reflux disease with esophagitis, without bleeding: Secondary | ICD-10-CM

## 2022-12-18 DIAGNOSIS — E1122 Type 2 diabetes mellitus with diabetic chronic kidney disease: Secondary | ICD-10-CM

## 2022-12-18 DIAGNOSIS — E1169 Type 2 diabetes mellitus with other specified complication: Secondary | ICD-10-CM

## 2022-12-18 DIAGNOSIS — I10 Essential (primary) hypertension: Secondary | ICD-10-CM

## 2022-12-18 DIAGNOSIS — E119 Type 2 diabetes mellitus without complications: Secondary | ICD-10-CM

## 2022-12-18 DIAGNOSIS — F419 Anxiety disorder, unspecified: Secondary | ICD-10-CM

## 2022-12-18 MED ORDER — LOSARTAN POTASSIUM-HCTZ 100-25 MG PO TABS: ORAL_TABLET | ORAL | 1 refills | Status: DC

## 2022-12-18 MED ORDER — OMEPRAZOLE 40 MG PO CPDR
DELAYED_RELEASE_CAPSULE | ORAL | 1 refills | Status: AC
Start: 2022-12-18 — End: ?

## 2022-12-18 MED ORDER — METFORMIN HCL ER 500 MG PO TB24: ORAL_TABLET | ORAL | 1 refills | Status: AC

## 2022-12-18 MED ORDER — SPIRONOLACTONE 25 MG PO TABS
ORAL_TABLET | ORAL | 1 refills | Status: DC
Start: 2022-12-18 — End: 2023-07-28

## 2022-12-18 MED ORDER — EMPAGLIFLOZIN 25 MG PO TABS: ORAL_TABLET | ORAL | 1 refills | Status: AC

## 2022-12-18 MED ORDER — CITALOPRAM HYDROBROMIDE 40 MG PO TABS: ORAL_TABLET | ORAL | 1 refills | Status: AC

## 2022-12-18 MED ORDER — OZEMPIC (2 MG/DOSE) 8 MG/3ML ~~LOC~~ SOPN
PEN_INJECTOR | SUBCUTANEOUS | 1 refills | Status: AC
Start: 2022-12-18 — End: ?

## 2022-12-18 MED ORDER — ROSUVASTATIN CALCIUM 40 MG PO TABS: ORAL_TABLET | ORAL | 1 refills | Status: DC

## 2022-12-18 MED ORDER — CARVEDILOL 12.5 MG PO TABS
ORAL_TABLET | ORAL | 1 refills | Status: DC
Start: 2022-12-18 — End: 2023-07-28

## 2022-12-18 MED ORDER — LEVOTHYROXINE SODIUM 150 MCG PO TABS: ORAL_TABLET | ORAL | 1 refills | Status: AC

## 2023-01-19 ENCOUNTER — Ambulatory Visit: Payer: Managed Care, Other (non HMO) | Admitting: Internal Medicine

## 2023-02-09 ENCOUNTER — Ambulatory Visit (INDEPENDENT_AMBULATORY_CARE_PROVIDER_SITE_OTHER): Payer: Managed Care, Other (non HMO) | Admitting: Internal Medicine

## 2023-02-09 ENCOUNTER — Encounter: Payer: Self-pay | Admitting: Internal Medicine

## 2023-02-09 VITALS — BP 124/80 | HR 68 | Temp 97.9°F | Resp 16 | Ht 70.0 in | Wt 201.8 lb

## 2023-02-09 DIAGNOSIS — E1122 Type 2 diabetes mellitus with diabetic chronic kidney disease: Secondary | ICD-10-CM

## 2023-02-09 DIAGNOSIS — I10 Essential (primary) hypertension: Secondary | ICD-10-CM

## 2023-02-09 DIAGNOSIS — E559 Vitamin D deficiency, unspecified: Secondary | ICD-10-CM

## 2023-02-09 DIAGNOSIS — I5042 Chronic combined systolic (congestive) and diastolic (congestive) heart failure: Secondary | ICD-10-CM

## 2023-02-09 DIAGNOSIS — I25110 Atherosclerotic heart disease of native coronary artery with unstable angina pectoris: Secondary | ICD-10-CM | POA: Diagnosis not present

## 2023-02-09 DIAGNOSIS — E1169 Type 2 diabetes mellitus with other specified complication: Secondary | ICD-10-CM

## 2023-02-09 DIAGNOSIS — E785 Hyperlipidemia, unspecified: Secondary | ICD-10-CM

## 2023-02-09 DIAGNOSIS — N182 Chronic kidney disease, stage 2 (mild): Secondary | ICD-10-CM

## 2023-02-09 DIAGNOSIS — Z79899 Other long term (current) drug therapy: Secondary | ICD-10-CM | POA: Diagnosis not present

## 2023-02-09 DIAGNOSIS — E039 Hypothyroidism, unspecified: Secondary | ICD-10-CM

## 2023-02-09 NOTE — Progress Notes (Signed)
Future Appointments  Date Time Provider Department  02/09/2023                 6 mo ov  2:30 PM Lucky Cowboy, MD [facility]  07/24/2023                 cpe  2:00 PM Lucky Cowboy, MD [facility]    History of Present Illness:       This very nice 57 y.o. MWM with HTN, HLD, T2_NIDDM  and Vitamin D Deficiency  presents for  6 month follow up.       Patient is treated for HTN  (1990)  & BP has been controlled at home.  Patient has hx/o chronic Biventricular Heart Failure & in May 2021, he underwent CABG x 7 V.  He has hx/o chronic combined CHF.  Today's BP is at goal - 124/80 .  Patient has had no complaints of any cardiac type chest pain, palpitations, dyspnea Pollyann Kennedy /PND, dizziness, claudication or dependent edema.        Hyperlipidemia is controlled with diet & meds. Patient denies myalgias or other med SE's. Last Lipids were  Lab Results  Component Value Date   CHOL 186 11/03/2022   HDL 73 11/03/2022   LDLCALC 97 11/03/2022   LDLDIRECT 120.3 11/07/2006   TRIG 71 11/03/2022   CHOLHDL 2.5 11/03/2022                                                       Patient has hx/o Low Testosterone ("323" /2011).  Recent TSH= 5.016 on 01/04/2023    Also, the patient has history of T2_NIDDM (2000) age 46 yo w/CKD2 (GFR 33).  He is currently treated with Metformin, Jardiance & Ozempic 1 mg  and has had no symptoms of reactive hypoglycemia, diabetic polys, paresthesias or visual blurring.  Patient follows with  Dr Loistine Simas ([place]  [place] [facility] [place] :  Lab Results  Component Value Date   HGBA1C 6.8 (H) 01/04/2023                                                         Further, the patient also has history of Vitamin D Deficiency  ("20" /2017 and "21" /2021)  and supplements vitamin D . Last vitamin D was at goial :  Lab Results  Component Value Date   VD25OH 58 07/19/2022     Current Outpatient Medications on File Prior to Visit   Medication Sig   aspirin EC 81 MG tablet Take  daily   carvedilol  12.5 MG tablet Take 1 tablet 2 x /day   Vitamin D 50 MCG 2000 u 1 tablet   citalopram 40 MG tablet Take  1 tablet   Daily    JARDIANCE 25 MG TABS  Take   1 tablet    Daily     Levothyroxine  tablet Take  1 tablet  Daily   losartan-hctz 100-25 MG tablet Take  1 tablet  every Morning     metFORMIN -XR 500 MG Takes 1 tab 1 x /day  Novolin 70/30  40 u qam   omeprazole  40 MG capsule Take  1 capsule  Daily    rosuvastatin 40 MG tablet Take  1 tablet  Daily    Semaglutide, 2 MG/DOSE Inject 2 mg into Skin every 7-10 days   spironolactone  25 MG tablet Take  1 tablet  Daily     PMHx:   Past Medical History:  Diagnosis Date   Aneurysm of ascending aorta (HCC) 09/24/2020   4.0 cm on echo in 2021.  Will repeat on follow-up.   Anxiety    ASTHMA 10/28/2006   Chronic combined systolic and diastolic heart failure (HCC) 09/24/2020   Coronary artery disease    Cough    Diabetes mellitus type II 2000   age 44   GERD (gastroesophageal reflux disease)    Hyperlipidemia    Hypertension 1995   Hypothyroidism 2002   thyroidectomy for Goiter   Lumbar back pain    NASH (nonalcoholic steatohepatitis)    Numbness    OSA (obstructive sleep apnea) 11/19/2010   Sleep apnea    Pt states mild case, doesn't use cpap     Immunization History  Administered Date(s) Administered   Influenza Inj Mdck Quad  02/20/2017   Influenza Split 01/16/2014   PPD Test 10/11/2017, 05/31/2019, 07/13/2020, 07/14/2021   Pneumococcal -23 08/16/2012   Tdap 08/10/2015     Past Surgical History:  Procedure Laterality Date   bilateral shoulder surgery     CARDIAC CATHETERIZATION     CORONARY ARTERY BYPASS GRAFT N/A 08/27/2019   Procedure: CORONARY ARTERY BYPASS GRAFTING (CABG) x7, LIMA TO LAD, SVG TO DIAG 1 WITH SEQUENTIAL SVG TO DIAG 2, SVG TO OM2, SVG TO PDA WITH SEQUENTIAL SVG TO PLB WITH SEQUENTIAL SVG TO OTHER.;  Surgeon: Alleen Borne,  MD;  Location: [facility] OR;  Service: Open Heart Surgery;  Laterality: N/A;   ENDOVEIN HARVEST OF GREATER SAPHENOUS VEIN Right 08/27/2019   Procedure: Mack Guise Of Greater Saphenous Vein;  Surgeon: Alleen Borne, MD;  Location: [facility] OR;  Service: Open Heart Surgery;  Laterality: Right;   LEFT HEART CATH AND CORONARY ANGIOGRAPHY N/A 08/26/2019   Procedure: LEFT HEART CATH AND CORONARY ANGIOGRAPHY;  Surgeon: Yvonne Kendall, MD;  Location: [facility] INVASIVE CV LAB;  Service: Cardiovascular;  Laterality: N/A;   right knee repair  1997   TEE WITHOUT CARDIOVERSION N/A 08/27/2019   Procedure: TRANSESOPHAGEAL ECHOCARDIOGRAM (TEE);  Surgeon: Alleen Borne, MD;  Location: [facility] OR;  Service: Open Heart Surgery;  Laterality: N/A;   THYROIDECTOMY     right   TRIGGER FINGER RELEASE     TRIGGER FINGER RELEASE  02/23/2011   Procedure: RELEASE TRIGGER FINGER/A-1 PULLEY;  Surgeon: Cammy Copa;  Location: [facility] OR;  Service: Orthopedics;  Laterality: Right;  Right 4th trigger finger release    FHx:    Reviewed / unchanged   SHx:    Reviewed / unchanged    Systems Review:  Constitutional: Denies fever, chills, wt changes, headaches, insomnia, fatigue, night sweats, change in appetite. Eyes: Denies redness, blurred vision, diplopia, discharge, itchy, watery eyes.  ENT: Denies discharge, congestion, post nasal drip, epistaxis, sore throat, earache, hearing loss, dental pain, tinnitus, vertigo, sinus pain, snoring.  CV: Denies chest pain, palpitations, irregular heartbeat, syncope, dyspnea, diaphoresis, orthopnea, PND, claudication or edema. Respiratory: denies cough, dyspnea, DOE, pleurisy, hoarseness, laryngitis, wheezing.  Gastrointestinal: Denies dysphagia, odynophagia, heartburn, reflux, water brash, abdominal pain or cramps, nausea, vomiting, bloating, diarrhea, constipation, hematemesis, melena, hematochezia  or hemorrhoids. Genitourinary: Denies dysuria, frequency, urgency, nocturia, hesitancy,  discharge, hematuria or flank pain. Musculoskeletal: Denies arthralgias, myalgias, stiffness, jt. swelling, pain, limping or strain/sprain.  Skin: Denies pruritus, rash, hives, warts, acne, eczema or change in skin lesion(s). Neuro: No weakness, tremor, incoordination, spasms, paresthesia or pain. Psychiatric: Denies confusion, memory loss or sensory loss. Endo: Denies change in weight, skin or hair change.  Heme/Lymph: No excessive bleeding, bruising or enlarged lymph nodes.   Physical Exam  BP 124/80   Pulse 68   Temp 97.9 F (36.6 C)   Resp 16   Ht 5\' 10"  (1.778 m)   Wt 201 lb 12.8 oz (91.5 kg)   SpO2 96%   BMI 28.96 kg/m   Appears  well nourished, well groomed  and in no distress.  Eyes: PERRLA, EOMs, conjunctiva no swelling or erythema. Sinuses: No frontal/maxillary tenderness ENT/Mouth: EAC's clear, TM's nl w/o erythema, bulging. Nares clear w/o erythema, swelling, exudates. Oropharynx clear without erythema or exudates. Oral hygiene is good. Tongue normal, non obstructing. Hearing intact.  Neck: Supple. Thyroid not palpable. Car 2+/2+ without bruits, nodes or JVD. Chest: Respirations nl with BS clear & equal w/o rales, rhonchi, wheezing or stridor.  Cor: Heart sounds normal w/ regular rate and rhythm without sig. murmurs, gallops, clicks or rubs. Peripheral pulses normal and equal  without edema.  Abdomen: Soft & bowel sounds normal. Non-tender w/o guarding, rebound, hernias, masses or organomegaly.  Lymphatics: Unremarkable.  Musculoskeletal: Full ROM all peripheral extremities, joint stability, 5/5 strength and normal gait.  Skin: Warm, dry without exposed rashes, lesions or ecchymosis apparent.  Neuro: Cranial nerves intact, reflexes equal bilaterally. Sensory-motor testing grossly intact. Tendon reflexes grossly intact.  Pysch: Alert & oriented x 3.  Insight and judgement nl & appropriate. No ideations.   Assessment and Plan:  1. Essential hypertension  - Continue  medication, monitor blood pressure at home.  - Continue DASH diet.  Reminder to go to the ER if any CP,  SOB, nausea, dizziness, severe HA, changes vision/speech.   - CBC with Differential/Platelet - COMPLETE METABOLIC PANEL WITH GFR - Magnesium  2.  Hyperlipidemia associated with type 2 diabetes mellitus (HCC)  - Continue diet/meds, exercise,& lifestyle modifications.  - Continue monitor periodic cholesterol/liver & renal functions    - Lipid panel  3. Type 2 diabetes mellitus with stage 2 chronic kidney                                      disease, without long-term current use of insulin (HCC)  - Continue diet, exercise  - Lifestyle modifications.  - Monitor appropriate labs     4. Vitamin D deficiency  - Continue supplementation   - VITAMIN D 25 Hydroxy   5. Coronary artery disease involving native coronary                                      artery of native heart with unstable angina pectoris (HCC)  - Lipid panel  6. Chronic combined systolic and diastolic CHF (HCC)   7. Hypothyroidism   8. Medication management  - CBC with Differential/Platelet - COMPLETE METABOLIC PANEL WITH GFR - Magnesium - VITAMIN D 25 Hydroxy - Lipid panel         Discussed  regular exercise, BP monitoring, weight control to achieve/maintain  BMI less than 25 and discussed med and SE's. Recommended labs to assess /monitor clinical status .  I discussed the assessment and treatment plan with the patient. The patient was provided an opportunity to ask questions and all were answered. The patient agreed with the plan and demonstrated an understanding of the instructions.  I provided over 30 minutes of exam, counseling, chart review and  complex critical decision making.        The patient was advised to call back or seek an in-person evaluation if the symptoms worsen or if the condition fails to improve as anticipated.   Marinus Maw, MD

## 2023-02-09 NOTE — Patient Instructions (Signed)

## 2023-02-10 LAB — CBC WITH DIFFERENTIAL/PLATELET
Absolute Lymphocytes: 2132 {cells}/uL (ref 850–3900)
Absolute Monocytes: 483 {cells}/uL (ref 200–950)
Basophils Absolute: 48 {cells}/uL (ref 0–200)
Basophils Relative: 0.7 %
Eosinophils Absolute: 269 {cells}/uL (ref 15–500)
Eosinophils Relative: 3.9 %
HCT: 44.2 % (ref 38.5–50.0)
Hemoglobin: 15.6 g/dL (ref 13.2–17.1)
MCH: 33.5 pg — ABNORMAL HIGH (ref 27.0–33.0)
MCHC: 35.3 g/dL (ref 32.0–36.0)
MCV: 94.8 fL (ref 80.0–100.0)
MPV: 9.7 fL (ref 7.5–12.5)
Monocytes Relative: 7 %
Neutro Abs: 3968 {cells}/uL (ref 1500–7800)
Neutrophils Relative %: 57.5 %
Platelets: 247 10*3/uL (ref 140–400)
RBC: 4.66 10*6/uL (ref 4.20–5.80)
RDW: 12.5 % (ref 11.0–15.0)
Total Lymphocyte: 30.9 %
WBC: 6.9 10*3/uL (ref 3.8–10.8)

## 2023-02-10 LAB — COMPLETE METABOLIC PANEL WITH GFR
AG Ratio: 1.4 (calc) (ref 1.0–2.5)
ALT: 29 U/L (ref 9–46)
AST: 26 U/L (ref 10–35)
Albumin: 4.8 g/dL (ref 3.6–5.1)
Alkaline phosphatase (APISO): 76 U/L (ref 35–144)
BUN: 21 mg/dL (ref 7–25)
CO2: 27 mmol/L (ref 20–32)
Calcium: 10 mg/dL (ref 8.6–10.3)
Chloride: 98 mmol/L (ref 98–110)
Creat: 1.1 mg/dL (ref 0.70–1.30)
Globulin: 3.4 g/dL (ref 1.9–3.7)
Glucose, Bld: 141 mg/dL — ABNORMAL HIGH (ref 65–99)
Potassium: 4.2 mmol/L (ref 3.5–5.3)
Sodium: 136 mmol/L (ref 135–146)
Total Bilirubin: 0.7 mg/dL (ref 0.2–1.2)
Total Protein: 8.2 g/dL — ABNORMAL HIGH (ref 6.1–8.1)
eGFR: 77 mL/min/{1.73_m2} (ref >=60)

## 2023-02-10 LAB — LIPID PANEL
Cholesterol: 155 mg/dL (ref <200)
HDL: 68 mg/dL (ref >=40)
LDL Cholesterol (Calc): 66 mg/dL
Non-HDL Cholesterol (Calc): 87 mg/dL (ref <130)
Total CHOL/HDL Ratio: 2.3 (calc) (ref <5.0)
Triglycerides: 119 mg/dL (ref <150)

## 2023-02-10 LAB — VITAMIN D 25 HYDROXY (VIT D DEFICIENCY, FRACTURES): Vit D, 25-Hydroxy: 36 ng/mL (ref 30–100)

## 2023-02-10 LAB — MAGNESIUM: Magnesium: 2.3 mg/dL (ref 1.5–2.5)

## 2023-02-11 NOTE — Progress Notes (Signed)
<>*<>*<>*<>*<>*<>*<>*<>*<>*<>*<>*<>*<>*<>*<>*<>*<>*<>*<>*<>*<>*<>*<>*<>*<> <>*<>*<>*<>*<>*<>*<>*<>*<>*<>*<>*<>*<>*<>*<>*<>*<>*<>*<>*<>*<>*<>*<>*<>*<>  -  Test results slightly outside the reference range are not unusual. If there is anything important, I will review this with you,  otherwise it is considered normal test values.  If you have further questions,  please do not hesitate to contact me at the office or via My Chart.   <>*<>*<>*<>*<>*<>*<>*<>*<>*<>*<>*<>*<>*<>*<>*<>*<>*<>*<>*<>*<>*<>*<>*<>*<> <>*<>*<>*<>*<>*<>*<>*<>*<>*<>*<>*<>*<>*<>*<>*<>*<>*<>*<>*<>*<>*<>*<>*<>*<>  -  Glucose = 141 mg % is high - Ideal or goal is less than 100 mg%  <>*<>*<>*<>*<>*<>*<>*<>*<>*<>*<>*<>*<>*<>*<>*<>*<>*<>*<>*<>*<>*<>*<>*<>*<> <>*<>*<>*<>*<>*<>*<>*<>*<>*<>*<>*<>*<>*<>*<>*<>*<>*<>*<>*<>*<>*<>*<>*<>*<>  -  Vitamin D = 36 is very low   - Vitamin D goal is between 70-100.   - Please INCREASE  your Vitamin D from 2,000 u      up    to 5,000 units EVERY day   - It is very important as a natural anti-inflammatory and helping the                    immune system protect against viral infections, like  Flu & Covid-19    -  helps  hair, skin, and nails, as well as reducing stroke and heart attack risk.   - It helps your bones and helps with mood.  - It also decreases numerous cancer risks so please                                                                                           take it as directed.   - Low Vit D is associated with a 200-300% higher risk for CANCER   and 200-300% higher risk for HEART   ATTACK  &  STROKE.    - It is also associated with higher death rate at younger ages,   autoimmune diseases like Rheumatoid arthritis, Lupus, Multiple Sclerosis.     - Also many other serious conditions, like depression, Alzheimer's  Dementia,  muscle aches, fatigue, fibromyalgia    <>*<>*<>*<>*<>*<>*<>*<>*<>*<>*<>*<>*<>*<>*<>*<>*<>*<>*<>*<>*<>*<>*<>*<>*<> <>*<>*<>*<>*<>*<>*<>*<>*<>*<>*<>*<>*<>*<>*<>*<>*<>*<>*<>*<>*<>*<>*<>*<>*<>  -  Chol = 155    Excellent   - Very low risk for Heart Attack  / Stroke  <>*<>*<>*<>*<>*<>*<>*<>*<>*<>*<>*<>*<>*<>*<>*<>*<>*<>*<>*<>*<>*<>*<>*<>*<> <>*<>*<>*<>*<>*<>*<>*<>*<>*<>*<>*<>*<>*<>*<>*<>*<>*<>*<>*<>*<>*<>*<>*<>*<>  -  All Else - CBC - Kidneys - Electrolytes - Liver - Magnesium & Thyroid    - all  Normal / OK  <>*<>*<>*<>*<>*<>*<>*<>*<>*<>*<>*<>*<>*<>*<>*<>*<>*<>*<>*<>*<>*<>*<>*<>*<> <>*<>*<>*<>*<>*<>*<>*<>*<>*<>*<>*<>*<>*<>*<>*<>*<>*<>*<>*<>*<>*<>*<>*<>*<>

## 2023-02-12 ENCOUNTER — Encounter: Payer: Self-pay | Admitting: Internal Medicine

## 2023-04-02 ENCOUNTER — Encounter (HOSPITAL_BASED_OUTPATIENT_CLINIC_OR_DEPARTMENT_OTHER): Payer: Self-pay | Admitting: Cardiovascular Disease

## 2023-05-29 ENCOUNTER — Ambulatory Visit: Payer: Managed Care, Other (non HMO) | Admitting: Nurse Practitioner

## 2023-07-24 ENCOUNTER — Encounter: Payer: Managed Care, Other (non HMO) | Admitting: Internal Medicine

## 2023-07-28 ENCOUNTER — Ambulatory Visit (HOSPITAL_BASED_OUTPATIENT_CLINIC_OR_DEPARTMENT_OTHER): Payer: Managed Care, Other (non HMO) | Admitting: Cardiovascular Disease

## 2023-07-28 ENCOUNTER — Encounter (HOSPITAL_BASED_OUTPATIENT_CLINIC_OR_DEPARTMENT_OTHER): Payer: Self-pay | Admitting: Cardiovascular Disease

## 2023-07-28 VITALS — BP 130/76 | HR 93 | Ht 70.0 in | Wt 204.0 lb

## 2023-07-28 DIAGNOSIS — I10 Essential (primary) hypertension: Secondary | ICD-10-CM

## 2023-07-28 DIAGNOSIS — I77811 Abdominal aortic ectasia: Secondary | ICD-10-CM | POA: Diagnosis not present

## 2023-07-28 DIAGNOSIS — E1169 Type 2 diabetes mellitus with other specified complication: Secondary | ICD-10-CM

## 2023-07-28 DIAGNOSIS — I214 Non-ST elevation (NSTEMI) myocardial infarction: Secondary | ICD-10-CM

## 2023-07-28 DIAGNOSIS — I25110 Atherosclerotic heart disease of native coronary artery with unstable angina pectoris: Secondary | ICD-10-CM

## 2023-07-28 DIAGNOSIS — K7581 Nonalcoholic steatohepatitis (NASH): Secondary | ICD-10-CM

## 2023-07-28 DIAGNOSIS — G4733 Obstructive sleep apnea (adult) (pediatric): Secondary | ICD-10-CM

## 2023-07-28 DIAGNOSIS — E785 Hyperlipidemia, unspecified: Secondary | ICD-10-CM

## 2023-07-28 DIAGNOSIS — I5042 Chronic combined systolic (congestive) and diastolic (congestive) heart failure: Secondary | ICD-10-CM

## 2023-07-28 MED ORDER — ROSUVASTATIN CALCIUM 40 MG PO TABS
ORAL_TABLET | ORAL | 3 refills | Status: AC
Start: 2023-07-28 — End: ?

## 2023-07-28 MED ORDER — METOPROLOL SUCCINATE ER 50 MG PO TB24: 50.0000 mg | ORAL_TABLET | Freq: Every day | ORAL | 3 refills | Status: AC

## 2023-07-28 MED ORDER — LOSARTAN POTASSIUM-HCTZ 100-25 MG PO TABS: ORAL_TABLET | ORAL | 3 refills | Status: AC

## 2023-07-28 MED ORDER — SPIRONOLACTONE 25 MG PO TABS: ORAL_TABLET | ORAL | 3 refills | Status: AC

## 2023-07-28 NOTE — Progress Notes (Signed)
 Cardiology Office Note:  .    Date:  08/01/2023  ID:  [name] [name], DOB 19-Jan-1966, MRN [id number] PCP: Vangie Genet, MD  [facility] Providers Cardiologist:  Maudine Sos, MD     History of Present Illness: .    [name] [name] is a 58 y.o. male with chronic systolic diastolic heart failure, CAD status post CABG 08/2019, mild ascending aorta aneurysm, diabetes, CKD, hypothyroidism, GERD, anxiety, OSA, and obesity who presents for follow-up.  He was initially seen in the hospital 08/2019 with chest pain in the setting of NSTEMI.  He presented to med [facility] and was transferred to [facility].  High-sensitivity troponin was over 13,000.  He underwent left heart catheterization 08/26/2019 that revealed diffuse three-vessel disease, including 90% left main disease.  He subsequently underwent CABG (LIMA--> LAD, SVG-->D1/D2, SVG-->PDA, PL1, PL2) with Dr. Sherene Dilling.  Echo that admission revealed LVEF 40 to 45% with grade 2 diastolic dysfunction.  LVEF improved to 60-65% on echo 9/021.  He was seen in the hospital 09/2019 with chest pain and dizziness.  He was hypotensive to the 70s systolic.  He elected not to participate in cardiac rehab.  He is back working at the [employer].    At his appointment 03/2020 Losartan  was added. He was encouraged to increase his exercise. He followed up with our pharmacist, and was started on Carvedilol  3.125 mg given that he had not tolerated beta-blockers in the past. He followed up with our pharmacist 12/2020 and his blood pressure was 160/98 but improved to 138/86 on repeat. He noticed lip tingling, so losartan  was reduced and carvedilol  was increased. Losartan  was increased to 100 mg with his PCP 02/2021. He was concerned about right LE numbness he thought was related to a previous vein harvest 08/2019. On his follow-up with our pharmacist 02/19/2021, he reported having COVID 3 weeks prior.  His blood pressure was 132/88 at that  time.   Mr. Artzer noted frequently missed nighttime doses of his medication. Spironolactone  was increased to 25 mg. He followed up with the pharmacist 04/2021 and carvedilol  was increased. He had a repeat Echo 04/2021 with LVEF 55-60%. Ascending aorta was stable at 4.0 cm.  Blood pressure was uncontrolled at his visit 07/2021 but he had not yet taken his medicine.   Discussed the use of AI scribe software for clinical note transcription with the patient, who gave verbal consent to proceed.  History of Present Illness [name] [name] feels generally well with no complaints. He engages in regular physical activity, including walking and yard work, which he describes as extensive due to the size of his yard. He feels a little winded and tired by the end of his activities, attributing this to his age. No chest pain or breathing difficulties during these activities.  He mentions a burning sensation in his ankle that has occurred a couple of times, describing it as feeling on fire but without redness. He associates this sensation with his known lower back disc problems, which he believes may be causing nerve-related symptoms. This sensation has also been experienced by his wife.  His current medications include carvedilol , losartan , hydrochlorothiazide , spironolactone  for blood pressure management, rosuvastatin  for cholesterol, and a baby aspirin . He acknowledges a past incident where he ran out of Synthroid , which affected his thyroid  levels, but this has since been resolved.  He does not monitor his blood pressure at home due to not having a working blood pressure cuff. His last recorded blood pressure  was 130/76 mmHg. His cholesterol levels were last checked in November, with an LDL of 66 mg/dL and an HDL within the desired range. His A1c levels have been stable, recorded at 6.9% and 6.8% in recent tests.  He reports a good diet, actively avoiding fried foods. He recently returned from a beach trip and  plans to visit [place] and the beach again in the coming months.  ROS:  As per HPI  Studies Reviewed: .       Echo 07/20/22:  1. Left ventricular ejection fraction, by estimation, is 55 to 60%. The  left ventricle has normal function. The left ventricle has no regional  wall motion abnormalities. There is mild left ventricular hypertrophy of  the basal-septal segment. Left  ventricular diastolic parameters were normal.   2. Right ventricular systolic function is normal. The right ventricular  size is normal.   3. The mitral valve is normal in structure. No evidence of mitral valve  regurgitation. No evidence of mitral stenosis.   4. The aortic valve is normal in structure. Aortic valve regurgitation is  not visualized. No aortic stenosis is present.   5. The inferior vena cava is normal in size with greater than 50%  respiratory variability, suggesting right atrial pressure of 3 mmHg.    Echo 08/26/19:  1. Left ventricular ejection fraction, by estimation, is 40 to 45%. The  left ventricle has mildly decreased function. The left ventricle  demonstrates regional wall motion abnormalities (see scoring  diagram/findings for description). Left ventricular  diastolic parameters are consistent with Grade II diastolic dysfunction  (pseudonormalization). There is moderate hypokinesis of the left  ventricular, mid-apical anteroseptal wall.   2. Right ventricular systolic function is normal. The right ventricular  size is normal.   3. The mitral valve is normal in structure. Mild mitral valve  regurgitation. No evidence of mitral stenosis.   4. The aortic valve is normal in structure. Aortic valve regurgitation is  not visualized. No aortic stenosis is present.    LHC 08/26/19: Diagnostic Dominance: Right    Conclusions: Severe multivessel coronary artery disease, including 90% distal LMCA stenosis, as well as 70-80% lesions involving the mid LAD, D1, large OM1, and rPL  branches. Mildly to moderately reduced left ventricular contraction (LVEF ~45%) with anterior hypokinesis.  Upper normal left ventricular filling pressure.   Carotid Doppler 08/26/19: Right Carotid: Velocities in the right ICA are consistent with a 40-59%                 stenosis.   Left Carotid: Velocities in the left ICA are consistent with a 1-39%  stenosis.  Vertebrals:  Bilateral vertebral arteries demonstrate antegrade flow.  Subclavians: Normal flow hemodynamics were seen in bilateral subclavian arteries.  ABIs: Right ABI: Resting right ankle-brachial index is within normal range. No  evidence of significant right lower extremity arterial disease.  Left ABI: Resting left ankle-brachial index is within normal range. No  evidence of significant left lower extremity arterial disease.  Left Upper Extremity: Doppler waveforms decrease >50% with left radial  compression. Doppler waveform obliterate with left ulnar compression.   Risk Assessment/Calculations:         Physical Exam:   VS:  BP 130/76   Pulse 93   Ht 5\' 10"  (1.778 m)   Wt 204 lb (92.5 kg)   SpO2 97%   BMI 29.27 kg/m  , BMI Body mass index is 29.27 kg/m. GENERAL:  Well appearing HEENT: Pupils equal round and reactive,  fundi not visualized, oral mucosa unremarkable NECK:  No jugular venous distention, waveform within normal limits, carotid upstroke brisk and symmetric, no bruits, no thyromegaly LUNGS:  Clear to auscultation bilaterally HEART:  RRR.  PMI not displaced or sustained,S1 and S2 within normal limits, no S3, no S4, no clicks, no rubs, no murmurs ABD:  Flat, positive bowel sounds normal in frequency in pitch, no bruits, no rebound, no guarding, no midline pulsatile mass, no hepatomegaly, no splenomegaly EXT:  2 plus pulses throughout, no edema, no cyanosis no clubbing SKIN:  No rashes no nodules NEURO:  Cranial nerves II through XII grossly intact, motor grossly intact throughout PSYCH:  Cognitively intact,  oriented to person place and time   ASSESSMENT AND PLAN: .    Assessment & Plan # Hypertension Hypertension is well-controlled with current medication regimen. - Continue current antihypertensive medications: losartan , hydrochlorothiazide , and spironolactone .  Switch carvedilol  to metoprolol  due to some low BP and higher HR at home.  - Monitor blood pressure regularly at home once a new blood pressure cuff is obtained.  BP goal <130/80  # CAD s/p CABG:  # Hyperlipidemia He is doing well and has no angina.  Hyperlipidemia is well-managed with current therapy. - Continue rosuvastatin  for cholesterol management.  # Type 2 Diabetes Mellitus Type 2 Diabetes Mellitus is under control with an A1c of 6.8-6.9%, below the target of 7%. - Continue current diabetes management plan, including Jardiance .  # Hypothyroidism Hypothyroidism is well-managed with Synthroid  therapy. - Continue Synthroid  as prescribed.  # Back Problems Intermittent burning sensation in the ankle may be related to nerve issues from L4-L5 disc problems. - Monitor symptoms and avoid activities that may exacerbate back issues.      Dispo: f/u 1 year  Signed, Maudine Sos, MD

## 2023-07-28 NOTE — Patient Instructions (Addendum)
 Medication Instructions:  STOP CARVEDILOL    START METOPROLOL  SUC 50 MG DAILY   *If you need a refill on your cardiac medications before your next appointment, please call your pharmacy*  Lab Work: NONE  Testing/Procedures: NONE  Follow-Up: At [facility], you and your health needs are our priority.  As part of our continuing mission to provide you with exceptional heart care, we have created designated Provider Care Teams.  These Care Teams include your primary Cardiologist (physician) and Advanced Practice Providers (APPs -  Physician Assistants and Nurse Practitioners) who all work together to provide you with the care you need, when you need it.  We recommend signing up for the patient portal called "MyChart".  Sign up information is provided on this After Visit Summary.  MyChart is used to connect with patients for Virtual Visits (Telemedicine).  Patients are able to view lab/test results, encounter notes, upcoming appointments, etc.  Non-urgent messages can be sent to your provider as well.   To learn more about what you can do with MyChart, go to [email or web address].    Your next appointment:   12 month(s)  The format for your next appointment:   In Person  Provider:   DR    MONITOR BLOOD PRESSURE AT HOME. CALL THE OFFICE IF YOUR BLOOD PRESSURE IS NOT CONSISTENTLY BELOW 130/80

## 2023-08-01 ENCOUNTER — Encounter (HOSPITAL_BASED_OUTPATIENT_CLINIC_OR_DEPARTMENT_OTHER): Payer: Self-pay | Admitting: Cardiovascular Disease

## 2023-09-07 ENCOUNTER — Encounter: Payer: Managed Care, Other (non HMO) | Admitting: Internal Medicine

## 2023-12-11 ENCOUNTER — Ambulatory Visit: Payer: Managed Care, Other (non HMO) | Admitting: Nurse Practitioner
